# Patient Record
Sex: Male | Born: 1945 | Race: White | Hispanic: No | Marital: Married | Smoking: Never smoker
Health system: Southern US, Community
[De-identification: ages and names within clinical notes are randomized; demographics above are authoritative.]

## PROBLEM LIST (undated history)

## (undated) DIAGNOSIS — T7840XA Allergy, unspecified, initial encounter: Secondary | ICD-10-CM

## (undated) DIAGNOSIS — F458 Other somatoform disorders: Secondary | ICD-10-CM

## (undated) DIAGNOSIS — M75101 Unspecified rotator cuff tear or rupture of right shoulder, not specified as traumatic: Secondary | ICD-10-CM

## (undated) DIAGNOSIS — K529 Noninfective gastroenteritis and colitis, unspecified: Secondary | ICD-10-CM

## (undated) DIAGNOSIS — K8020 Calculus of gallbladder without cholecystitis without obstruction: Secondary | ICD-10-CM

## (undated) DIAGNOSIS — I739 Peripheral vascular disease, unspecified: Secondary | ICD-10-CM

## (undated) DIAGNOSIS — H269 Unspecified cataract: Secondary | ICD-10-CM

## (undated) DIAGNOSIS — Z973 Presence of spectacles and contact lenses: Secondary | ICD-10-CM

## (undated) DIAGNOSIS — H903 Sensorineural hearing loss, bilateral: Secondary | ICD-10-CM

## (undated) DIAGNOSIS — H6990 Unspecified Eustachian tube disorder, unspecified ear: Secondary | ICD-10-CM

## (undated) DIAGNOSIS — N400 Enlarged prostate without lower urinary tract symptoms: Secondary | ICD-10-CM

## (undated) DIAGNOSIS — M1990 Unspecified osteoarthritis, unspecified site: Secondary | ICD-10-CM

## (undated) DIAGNOSIS — I70219 Atherosclerosis of native arteries of extremities with intermittent claudication, unspecified extremity: Secondary | ICD-10-CM

## (undated) DIAGNOSIS — R339 Retention of urine, unspecified: Secondary | ICD-10-CM

## (undated) DIAGNOSIS — J300 Vasomotor rhinitis: Secondary | ICD-10-CM

## (undated) DIAGNOSIS — E8881 Metabolic syndrome: Secondary | ICD-10-CM

## (undated) DIAGNOSIS — R0989 Other specified symptoms and signs involving the circulatory and respiratory systems: Secondary | ICD-10-CM

## (undated) DIAGNOSIS — E782 Mixed hyperlipidemia: Secondary | ICD-10-CM

## (undated) DIAGNOSIS — I2510 Atherosclerotic heart disease of native coronary artery without angina pectoris: Secondary | ICD-10-CM

## (undated) DIAGNOSIS — N411 Chronic prostatitis: Secondary | ICD-10-CM

## (undated) DIAGNOSIS — I10 Essential (primary) hypertension: Secondary | ICD-10-CM

## (undated) DIAGNOSIS — M75102 Unspecified rotator cuff tear or rupture of left shoulder, not specified as traumatic: Secondary | ICD-10-CM

## (undated) DIAGNOSIS — G709 Myoneural disorder, unspecified: Secondary | ICD-10-CM

## (undated) DIAGNOSIS — K219 Gastro-esophageal reflux disease without esophagitis: Secondary | ICD-10-CM

## (undated) DIAGNOSIS — I200 Unstable angina: Secondary | ICD-10-CM

## (undated) DIAGNOSIS — I209 Angina pectoris, unspecified: Secondary | ICD-10-CM

## (undated) DIAGNOSIS — E785 Hyperlipidemia, unspecified: Secondary | ICD-10-CM

## (undated) HISTORY — DX: Benign prostatic hyperplasia without lower urinary tract symptoms: N40.0

## (undated) HISTORY — DX: Atherosclerotic heart disease of native coronary artery without angina pectoris: I25.10

## (undated) HISTORY — DX: Gastro-esophageal reflux disease without esophagitis: K21.9

## (undated) HISTORY — DX: Unspecified cataract: H26.9

## (undated) HISTORY — DX: Allergy, unspecified, initial encounter: T78.40XA

## (undated) HISTORY — DX: Myoneural disorder, unspecified: G70.9

## (undated) HISTORY — PX: POLYPECTOMY: SHX149

## (undated) HISTORY — DX: Hyperlipidemia, unspecified: E78.5

## (undated) HISTORY — PX: OTHER SURGICAL HISTORY: SHX169

## (undated) HISTORY — PX: WISDOM TOOTH EXTRACTION: SHX21

## (undated) HISTORY — DX: Essential (primary) hypertension: I10

## (undated) HISTORY — PX: UPPER GASTROINTESTINAL ENDOSCOPY: SHX188

## (undated) NOTE — *Deleted (*Deleted)
[address]       [place] [address]             [phone]        SULAYMAN MANNING [facility] Medical Record #[id number] Date of Birth: 07/02/1946  Referring: No ref. provider found Primary Care: Joaquim Nam, MD Primary Cardiologist:Bruce Mardene Sayer, MD  Chief Complaint: Chest pain  History of Present Illness:    We are asked to see this 22 year old male in cardiothoracic surgical consultation for consideration of CABG.  He has a past  medical history inclusive of coronary disease based on coronary calcium score as well as dyslipidemia, hypertension, and gastroesophageal reflux.  He did have a normal stress test in 2010.  He presented to the ER with substernal chest pain that was noted to be worse with exertion.  He was admitted with slightly elevated troponin levels for further evaluation and treatment.  An echocardiogram showed normal LV ejection fraction of 60% with no evidence of significant valvular heart disease.  He underwent cardiac catheterization which showed severe left main, ostial LAD, circumflex and ostial right coronary artery disease and he was transferred to [facility] for surgical evaluation.    Current Activity/ Functional Status: {functional status:19517}   Zubrod Score: At the time of surgery this patient's most appropriate activity status/level should be described as: []     0    Normal activity, no symptoms []     1    Restricted in physical strenuous activity but ambulatory, able to do out light work []     2    Ambulatory and capable of self care, unable to do work activities, up and about                 more than 50%  Of the time                            []     3    Only limited self care, in bed greater than 50% of waking hours []     4    Completely disabled, no self care, confined to bed or chair []     5    Moribund  Past Medical History:  Diagnosis Date  . Allergy   . Anginal pain (HCC)   . BPH (benign prostatic  hypertrophy) 6/07  . CAD (coronary artery disease) 2010   dx made by coronary calcium scoring, Normal stress test in 2010.   Marland Kitchen GERD (gastroesophageal reflux disease) 12/05  . HLD (hyperlipidemia) 3/99  . HTN (hypertension) 2002   no meds  . Left rotator cuff tear 08/12/2011   Per Dr. Dion Saucier 2013 L rotator cuff tear on MRI 2013   . Right rotator cuff tear 05/24/2013  . Wears glasses     Past Surgical History:  Procedure Laterality Date  . admitted to [facility] chest pain  12/15/05   Dr. Juliann Pares  . BLEPHAROPLASTY  10/11   Dr. Kemper Durie   . bone spur removed Right   . COLONOSCOPY  09/24/04   nml; Dr. Sharen Hint   . EGD esophagitis by bx  01/26/06   gastritis bx neg h-pylori  . POLYPECTOMY    . right and left prostate needle bx  05/02/03   acute inflammation; Dr. Patsi Sears   . SHOULDER ARTHROSCOPY W/ ROTATOR CUFF REPAIR  12/2011   left  . SHOULDER ARTHROSCOPY WITH ROTATOR CUFF REPAIR AND SUBACROMIAL DECOMPRESSION  Right 05/24/2013   Procedure: RIGHT SHOULDER ARTHROSCOPY WITH ROTATOR CUFF REPAIR AND SUBACROMIAL DECOMPRESSION AND PARTIAL ACROMIOPLSTY WITH CORACOACROMIAL RELEASE, DISTAL CLAVICULECTOMY, LABRIAL DEBRIDEMENT;  Surgeon: Eulas Post, MD;  Location: [facility];  Service: Orthopedics;  Laterality: Right;  . spect ETT nml  12/16/05  . UPPER GASTROINTESTINAL ENDOSCOPY    . WISDOM TOOTH EXTRACTION      Social History   Tobacco Use  Smoking Status Never Smoker  Smokeless Tobacco Never Used    Social History   Substance and Sexual Activity  Alcohol Use Yes  . Alcohol/week: 0.0 standard drinks   Comment: beer, occ     Allergies  Allergen Reactions  . Ciprofloxacin Hcl Rash  . Quinolones Rash    No current facility-administered medications for this encounter.    Facility-Administered Medications Prior to Admission  Medication Dose Route Frequency Provider Last Rate Last Admin  . betamethasone acetate-betamethasone sodium phosphate (CELESTONE) injection 3 mg  3  mg Intramuscular Once Felecia Shelling, DPM       Medications Prior to Admission  Medication Sig Dispense Refill Last Dose  . alfuzosin (UROXATRAL) 10 MG 24 hr tablet Take 1 tablet (10 mg total) by mouth daily with breakfast. 90 tablet 3   . aspirin 81 MG EC tablet Take 81 mg by mouth daily.       . Azelastine-Fluticasone 137-50 MCG/ACT SUSP Place 1 spray into both nostrils 2 (two) times daily.     . calcium citrate-vitamin D (CITRACAL+D) 315-200 MG-UNIT tablet Take 1-2 tablets by mouth 2 (two) times daily.     . Cholecalciferol (VITAMIN D) 2000 UNITS CAPS Take 1 capsule by mouth daily.       . clindamycin (CLEOCIN T) 1 % external solution Apply topically 2 (two) times daily as needed.     . Cyanocobalamin (VITAMIN B-12) 5000 MCG SUBL Place 1 tablet under the tongue daily.     . finasteride (PROSCAR) 5 MG tablet Take 1 tablet (5 mg total) by mouth daily. 90 tablet 3   . Icosapent Ethyl (VASCEPA) 1 G CAPS Take 1 tablet by mouth 4 (four) times daily.     Melene Muller ON 05/28/2020] metoprolol succinate (TOPROL-XL) 25 MG 24 hr tablet Take 1 tablet (25 mg total) by mouth daily. 30 tablet 0   . Multiple Vitamin (MULTIVITAMIN) tablet Take 1 tablet by mouth daily.       . rosuvastatin (CRESTOR) 5 MG tablet Take 5 mg by mouth daily.       Family History  Problem Relation Age of Onset  . Stroke Mother   . Glaucoma Other        grandmother at 89  . Barrett's esophagus Maternal Uncle   . Prostate cancer Neg Hx   . Colon cancer Neg Hx   . Colon polyps Neg Hx   . Esophageal cancer Neg Hx   . Rectal cancer Neg Hx   . Stomach cancer Neg Hx      Review of Systems:   Review of Systems  Constitutional: Negative for chills, diaphoresis, fever, malaise/fatigue and weight loss.  HENT: Negative for congestion, ear discharge, ear pain, hearing loss, nosebleeds, sinus pain and tinnitus.   Respiratory: Positive for shortness of breath.   Cardiovascular: Positive for chest pain.  Skin: Negative for itching  and rash.   {Ros - complete:30496}     Cardiac Review of Systems: Y or  [    ]= no  Chest Pain [    ]  Resting SOB [   ] Exertional SOB  [  ]  Orthopnea [  ]   Pedal Edema [   ]    Palpitations [  ] Syncope  [  ]   Presyncope [   ]  General Review of Systems: [Y] = yes [  ]=no Constitional: recent weight change [  ]; anorexia [  ]; fatigue [  ]; nausea [  ]; night sweats [  ]; fever [  ]; or chills [  ]                                                               Dental: Last Dentist visit:   Eye : blurred vision [  ]; diplopia [   ]; vision changes [  ];  Amaurosis fugax[  ]; Resp: cough [  ];  wheezing[  ];  hemoptysis[  ]; shortness of breath[  ]; paroxysmal nocturnal dyspnea[  ]; dyspnea on exertion[  ]; or orthopnea[  ];  GI:  gallstones[  ], vomiting[  ];  dysphagia[  ]; melena[  ];  hematochezia [  ]; heartburn[  ];   Hx of  Colonoscopy[  ]; GU: kidney stones [  ]; hematuria[  ];   dysuria [  ];  nocturia[  ];  history of     obstruction [  ]; urinary frequency [  ]             Skin: rash, swelling[  ];, hair loss[  ];  peripheral edema[  ];  or itching[  ]; Musculosketetal: myalgias[  ];  joint swelling[  ];  joint erythema[  ];  joint pain[  ];  back pain[  ];  Heme/Lymph: bruising[  ];  bleeding[  ];  anemia[  ];  Neuro: TIA[  ];  headaches[  ];  stroke[  ];  vertigo[  ];  seizures[  ];   paresthesias[  ];  difficulty walking[  ];  Psych:depression[  ]; anxiety[  ];  Endocrine: diabetes[  ];  thyroid dysfunction[  ];            {Dental Care:21289::"Single injection performed as below:"}            {Flue/Pneumonia Vaccination Status:21291}      Physical Exam: There were no vitals taken for this visit.   {physical exam:21449}  Diagnostic Studies & Laboratory data:     Recent Radiology Findings:   DG Chest 2 View  Result Date: 05/25/2020 CLINICAL DATA:  Chest pain EXAM: CHEST - 2 VIEW COMPARISON:  Chest x-ray 12/15/2005 FINDINGS: The heart size and mediastinal contours  are within normal limits. Left base compressive atelectasis. No focal consolidation. No pulmonary edema. Blunting of bilateral costophrenic angles with possible trace pleural effusions bilaterally. No pneumothorax. No acute osseous abnormality. Multilevel degenerative changes of the spine. IMPRESSION: 1. Blunting of bilateral costophrenic angles with possible trace pleural effusions bilaterally. 2. Otherwise no acute cardiopulmonary abnormality. Electronically Signed   By: Tish Frederickson M.D.   On: 05/25/2020 19:52   CARDIAC CATHETERIZATION  Result Date: 05/26/2020  Ost LAD to Prox LAD lesion is 95% stenosed.  Prox LAD to Mid LAD lesion is 75% stenosed with 75% stenosed side branch in 1st Sept.  Ramus lesion is 90%  stenosed.  Ost Cx to Prox Cx lesion is 99% stenosed.  Mid Cx lesion is 100% stenosed.  Prox RCA lesion is 100% stenosed.  Mid RCA lesion is 100% stenosed.  84 year old male with severe hyperlipidemia hypertension with progressive symptoms of shortness of breath anginal symptoms over the last several weeks with some chest pain at rest and a slight elevation of troponin Cardiogram showing normal LV systolic function with ejection fraction to 60% and no evidence of significant valvular heart disease Left ventricle angiogram showing normal LV systolic function Patient has critical distal left main ostial LAD ostial ramus ostial right coronary artery and ostial circumflex artery stenosis Plan Continue nitrates for chest discomfort Aspirin beta-blocker for hypertension control and cardiovascular disease Discussion for transfer for coronary artery bypass graft Cardiac rehabilitation   ECHOCARDIOGRAM COMPLETE  Result Date: 05/26/2020    ECHOCARDIOGRAM REPORT   Patient Name:   ZAIRE LEVESQUE Villaflor Date of Exam: 05/26/2020 Medical Rec #:  [id number]        Height:       66.0 in Accession #:    [id number]       Weight:       200.0 lb Date of Birth:  15-Jan-1946       BSA:          2.000 m Patient Age:     73 years         BP:           178/85 mmHg Patient Gender: M                HR:           49 bpm. Exam Location:  [facility] Procedure: 2D Echo, Cardiac Doppler and Color Doppler Indications:     Chest pain 786.50  History:         Patient has no prior history of Echocardiogram examinations.                  CAD; Risk Factors:Hypertension and Dyslipidemia.  Sonographer:     Cristela Blue RDCS (AE) Referring Phys:  [id number] Lucile Shutters Diagnosing Phys: Arnoldo Hooker MD IMPRESSIONS  1. Left ventricular ejection fraction, by estimation, is 60 to 65%. The left ventricle has normal function. The left ventricle has no regional wall motion abnormalities. Left ventricular diastolic parameters were normal.  2. Right ventricular systolic function is normal. The right ventricular size is normal.  3. The mitral valve is normal in structure. Trivial mitral valve regurgitation.  4. The aortic valve is normal in structure. Aortic valve regurgitation is not visualized. FINDINGS  Left Ventricle: Left ventricular ejection fraction, by estimation, is 60 to 65%. The left ventricle has normal function. The left ventricle has no regional wall motion abnormalities. The left ventricular internal cavity size was normal in size. There is  no left ventricular hypertrophy. Left ventricular diastolic parameters were normal. Right Ventricle: The right ventricular size is normal. No increase in right ventricular wall thickness. Right ventricular systolic function is normal. Left Atrium: Left atrial size was normal in size. Right Atrium: Right atrial size was normal in size. Pericardium: There is no evidence of pericardial effusion. Mitral Valve: The mitral valve is normal in structure. Trivial mitral valve regurgitation. Tricuspid Valve: The tricuspid valve is normal in structure. Tricuspid valve regurgitation is trivial. Aortic Valve: The aortic valve is normal in structure. Aortic valve regurgitation is not visualized. Aortic valve mean gradient  measures 2.0 mmHg. Aortic valve peak gradient measures 3.9 mmHg. Aortic  valve area, by VTI measures 4.73 cm. Pulmonic Valve: The pulmonic valve was normal in structure. Pulmonic valve regurgitation is not visualized. Aorta: The aortic root and ascending aorta are structurally normal, with no evidence of dilitation. IAS/Shunts: No atrial level shunt detected by color flow Doppler.  LEFT VENTRICLE PLAX 2D LVIDd:         4.36 cm LVIDs:         2.80 cm LV PW:         1.32 cm LV IVS:        1.21 cm LVOT diam:     2.30 cm LV SV:         95 LV SV Index:   48 LVOT Area:     4.15 cm  RIGHT VENTRICLE RV Basal diam:  3.72 cm RV S prime:     12.20 cm/s TAPSE (M-mode): 3.1 cm LEFT ATRIUM             Index       RIGHT ATRIUM           Index LA diam:        3.80 cm 1.90 cm/m  RA Area:     17.70 cm LA Vol (A2C):   64.8 ml 32.40 ml/m RA Volume:   48.70 ml  24.35 ml/m LA Vol (A4C):   58.4 ml 29.20 ml/m LA Biplane Vol: 61.7 ml 30.85 ml/m  AORTIC VALVE                   PULMONIC VALVE AV Area (Vmax):    3.60 cm    PV Vmax:        0.57 m/s AV Area (Vmean):   4.05 cm    PV Peak grad:   1.3 mmHg AV Area (VTI):     4.73 cm    RVOT Peak grad: 2 mmHg AV Vmax:           98.80 cm/s AV Vmean:          64.750 cm/s AV VTI:            0.201 m AV Peak Grad:      3.9 mmHg AV Mean Grad:      2.0 mmHg LVOT Vmax:         85.50 cm/s LVOT Vmean:        63.100 cm/s LVOT VTI:          0.229 m LVOT/AV VTI ratio: 1.14  AORTA Ao Root diam: 3.10 cm MITRAL VALVE                TRICUSPID VALVE MV Area (PHT): 2.39 cm     TR Peak grad:   13.1 mmHg MV Decel Time: 318 msec     TR Vmax:        181.00 cm/s MV E velocity: 84.00 cm/s MV A velocity: 116.00 cm/s  SHUNTS MV E/A ratio:  0.72         Systemic VTI:  0.23 m                             Systemic Diam: 2.30 cm Arnoldo Hooker MD Electronically signed by Arnoldo Hooker MD Signature Date/Time: 05/26/2020/1:42:02 PM    Final      I have independently reviewed the above radiologic studies and discussed  with the patient   Recent Lab Findings: Lab Results  Component Value Date   WBC 5.6 05/26/2020  HGB 13.8 05/26/2020   HCT 40.7 05/26/2020   PLT 211 05/26/2020   GLUCOSE 90 05/25/2020   CHOL 143 05/26/2020   TRIG 141 05/26/2020   HDL 52 05/26/2020   LDLCALC 63 05/26/2020   ALT 29 05/25/2020   AST 34 05/25/2020   NA 136 05/25/2020   K 4.1 05/25/2020   CL 100 05/25/2020   CREATININE 0.87 05/26/2020   BUN 21 05/25/2020   CO2 27 05/25/2020   TSH 2.478 02/05/2003      Assessment / Plan:          I  spent {CHL ONC TIME VISIT - NGEXB:[id number]} counseling the patient face to face.   Rowe Clack, PA-C 05/27/2020 4:00 PM

---

## 1997-09-22 DIAGNOSIS — E785 Hyperlipidemia, unspecified: Secondary | ICD-10-CM

## 1997-09-22 HISTORY — DX: Hyperlipidemia, unspecified: E78.5

## 1998-12-24 ENCOUNTER — Encounter: Payer: Self-pay | Admitting: Family Medicine

## 1998-12-24 LAB — CONVERTED CEMR LAB: PSA: 3.8 ng/mL

## 1999-01-18 ENCOUNTER — Encounter: Payer: Self-pay | Admitting: Family Medicine

## 1999-01-18 LAB — CONVERTED CEMR LAB: PSA: 3.8 ng/mL

## 1999-12-24 ENCOUNTER — Encounter: Payer: Self-pay | Admitting: Family Medicine

## 1999-12-24 LAB — CONVERTED CEMR LAB: PSA: 4 ng/mL

## 2000-01-21 ENCOUNTER — Encounter: Payer: Self-pay | Admitting: Family Medicine

## 2000-01-21 LAB — CONVERTED CEMR LAB
Blood Glucose, Fasting: 90 mg/dL
PSA: 4 ng/mL

## 2000-07-25 DIAGNOSIS — I10 Essential (primary) hypertension: Secondary | ICD-10-CM | POA: Diagnosis present

## 2000-07-25 DIAGNOSIS — N400 Enlarged prostate without lower urinary tract symptoms: Secondary | ICD-10-CM | POA: Diagnosis present

## 2000-07-25 HISTORY — DX: Essential (primary) hypertension: I10

## 2003-01-23 ENCOUNTER — Encounter: Payer: Self-pay | Admitting: Family Medicine

## 2003-01-23 LAB — CONVERTED CEMR LAB: PSA: 4.2 ng/mL

## 2003-02-05 ENCOUNTER — Encounter: Payer: Self-pay | Admitting: Family Medicine

## 2003-02-05 LAB — CONVERTED CEMR LAB
Blood Glucose, Fasting: 98 mg/dL
TSH: 2.478 microintl units/mL

## 2003-02-06 ENCOUNTER — Encounter: Payer: Self-pay | Admitting: Family Medicine

## 2003-02-06 LAB — CONVERTED CEMR LAB: PSA: 4.2 ng/mL

## 2003-05-02 HISTORY — PX: OTHER SURGICAL HISTORY: SHX169

## 2004-06-08 ENCOUNTER — Ambulatory Visit: Payer: Self-pay | Admitting: Family Medicine

## 2004-06-24 DIAGNOSIS — K219 Gastro-esophageal reflux disease without esophagitis: Secondary | ICD-10-CM

## 2004-06-24 HISTORY — DX: Gastro-esophageal reflux disease without esophagitis: K21.9

## 2004-08-09 ENCOUNTER — Encounter: Payer: Self-pay | Admitting: Family Medicine

## 2004-08-09 LAB — CONVERTED CEMR LAB: PSA: 2.8 ng/mL

## 2004-09-24 ENCOUNTER — Ambulatory Visit: Payer: Self-pay | Admitting: Gastroenterology

## 2004-09-24 HISTORY — PX: COLONOSCOPY: SHX174

## 2004-10-04 ENCOUNTER — Encounter: Admission: RE | Admit: 2004-10-04 | Discharge: 2004-10-04 | Payer: Self-pay | Admitting: Allergy and Immunology

## 2005-04-06 ENCOUNTER — Encounter: Payer: Self-pay | Admitting: Family Medicine

## 2005-04-06 LAB — CONVERTED CEMR LAB: PSA: 4.4 ng/mL

## 2005-06-09 ENCOUNTER — Ambulatory Visit: Payer: Self-pay | Admitting: Family Medicine

## 2005-08-08 ENCOUNTER — Encounter: Payer: Self-pay | Admitting: Family Medicine

## 2005-08-08 LAB — CONVERTED CEMR LAB: PSA: 4.39 ng/mL

## 2005-12-15 ENCOUNTER — Observation Stay: Payer: Self-pay | Admitting: Internal Medicine

## 2005-12-15 ENCOUNTER — Other Ambulatory Visit: Payer: Self-pay

## 2005-12-15 ENCOUNTER — Encounter: Payer: Self-pay | Admitting: Family Medicine

## 2005-12-15 HISTORY — PX: OTHER SURGICAL HISTORY: SHX169

## 2005-12-15 LAB — CONVERTED CEMR LAB: Blood Glucose, Fasting: 115 mg/dL

## 2005-12-16 HISTORY — PX: OTHER SURGICAL HISTORY: SHX169

## 2005-12-23 DIAGNOSIS — N400 Enlarged prostate without lower urinary tract symptoms: Secondary | ICD-10-CM

## 2005-12-23 HISTORY — DX: Benign prostatic hyperplasia without lower urinary tract symptoms: N40.0

## 2005-12-26 ENCOUNTER — Ambulatory Visit: Payer: Self-pay | Admitting: Family Medicine

## 2005-12-26 LAB — CONVERTED CEMR LAB: PSA: 4 ng/mL

## 2006-01-12 ENCOUNTER — Ambulatory Visit: Payer: Self-pay | Admitting: Gastroenterology

## 2006-01-19 ENCOUNTER — Ambulatory Visit: Payer: Self-pay | Admitting: Gastroenterology

## 2006-01-26 ENCOUNTER — Ambulatory Visit: Payer: Self-pay | Admitting: Gastroenterology

## 2006-01-26 ENCOUNTER — Encounter: Payer: Self-pay | Admitting: Gastroenterology

## 2006-01-26 HISTORY — PX: OTHER SURGICAL HISTORY: SHX169

## 2006-05-11 ENCOUNTER — Ambulatory Visit: Payer: Self-pay | Admitting: Gastroenterology

## 2006-11-27 ENCOUNTER — Telehealth: Payer: Self-pay | Admitting: Family Medicine

## 2007-02-12 ENCOUNTER — Encounter: Payer: Self-pay | Admitting: Family Medicine

## 2007-02-16 ENCOUNTER — Encounter: Payer: Self-pay | Admitting: Family Medicine

## 2007-02-16 DIAGNOSIS — N411 Chronic prostatitis: Secondary | ICD-10-CM | POA: Insufficient documentation

## 2007-02-16 DIAGNOSIS — J309 Allergic rhinitis, unspecified: Secondary | ICD-10-CM | POA: Insufficient documentation

## 2007-02-16 DIAGNOSIS — N3010 Interstitial cystitis (chronic) without hematuria: Secondary | ICD-10-CM | POA: Insufficient documentation

## 2007-02-18 DIAGNOSIS — I10 Essential (primary) hypertension: Secondary | ICD-10-CM | POA: Insufficient documentation

## 2007-02-18 DIAGNOSIS — K219 Gastro-esophageal reflux disease without esophagitis: Secondary | ICD-10-CM | POA: Insufficient documentation

## 2007-02-23 ENCOUNTER — Ambulatory Visit: Payer: Self-pay | Admitting: Family Medicine

## 2007-02-23 DIAGNOSIS — E8881 Metabolic syndrome: Secondary | ICD-10-CM | POA: Insufficient documentation

## 2007-03-19 ENCOUNTER — Encounter: Payer: Self-pay | Admitting: Family Medicine

## 2007-05-14 ENCOUNTER — Encounter: Payer: Self-pay | Admitting: Family Medicine

## 2007-09-03 ENCOUNTER — Encounter: Payer: Self-pay | Admitting: Family Medicine

## 2007-10-08 ENCOUNTER — Ambulatory Visit: Payer: Self-pay | Admitting: Family Medicine

## 2007-10-08 DIAGNOSIS — H6120 Impacted cerumen, unspecified ear: Secondary | ICD-10-CM | POA: Insufficient documentation

## 2007-11-12 ENCOUNTER — Encounter: Payer: Self-pay | Admitting: Family Medicine

## 2008-05-07 ENCOUNTER — Encounter: Payer: Self-pay | Admitting: Family Medicine

## 2008-07-25 DIAGNOSIS — I2510 Atherosclerotic heart disease of native coronary artery without angina pectoris: Secondary | ICD-10-CM

## 2008-07-25 HISTORY — DX: Atherosclerotic heart disease of native coronary artery without angina pectoris: I25.10

## 2008-08-04 ENCOUNTER — Encounter: Payer: Self-pay | Admitting: Family Medicine

## 2008-08-07 ENCOUNTER — Ambulatory Visit: Payer: Self-pay | Admitting: Family Medicine

## 2009-02-09 ENCOUNTER — Ambulatory Visit: Payer: Self-pay | Admitting: Family Medicine

## 2009-03-12 ENCOUNTER — Encounter: Payer: Self-pay | Admitting: Family Medicine

## 2009-03-12 ENCOUNTER — Telehealth: Payer: Self-pay | Admitting: Family Medicine

## 2009-03-12 DIAGNOSIS — I519 Heart disease, unspecified: Secondary | ICD-10-CM | POA: Insufficient documentation

## 2009-03-13 ENCOUNTER — Encounter: Payer: Self-pay | Admitting: Family Medicine

## 2009-03-16 ENCOUNTER — Ambulatory Visit: Payer: Self-pay | Admitting: Cardiovascular Disease

## 2009-03-16 DIAGNOSIS — I2510 Atherosclerotic heart disease of native coronary artery without angina pectoris: Secondary | ICD-10-CM | POA: Insufficient documentation

## 2009-03-18 ENCOUNTER — Telehealth (INDEPENDENT_AMBULATORY_CARE_PROVIDER_SITE_OTHER): Payer: Self-pay

## 2009-03-19 ENCOUNTER — Encounter: Payer: Self-pay | Admitting: Internal Medicine

## 2009-03-19 ENCOUNTER — Ambulatory Visit: Payer: Self-pay

## 2009-04-16 ENCOUNTER — Encounter: Payer: Self-pay | Admitting: Family Medicine

## 2009-05-11 ENCOUNTER — Encounter: Payer: Self-pay | Admitting: Family Medicine

## 2009-06-03 ENCOUNTER — Ambulatory Visit: Payer: Self-pay | Admitting: Family Medicine

## 2009-07-06 ENCOUNTER — Telehealth: Payer: Self-pay | Admitting: Family Medicine

## 2009-07-07 ENCOUNTER — Telehealth: Payer: Self-pay | Admitting: Family Medicine

## 2009-07-14 ENCOUNTER — Encounter: Payer: Self-pay | Admitting: Family Medicine

## 2009-07-20 ENCOUNTER — Telehealth: Payer: Self-pay | Admitting: Family Medicine

## 2009-07-20 ENCOUNTER — Encounter: Payer: Self-pay | Admitting: Family Medicine

## 2009-07-23 ENCOUNTER — Telehealth: Payer: Self-pay | Admitting: Family Medicine

## 2009-07-27 ENCOUNTER — Encounter: Payer: Self-pay | Admitting: Family Medicine

## 2009-07-28 ENCOUNTER — Encounter: Payer: Self-pay | Admitting: Family Medicine

## 2009-07-28 ENCOUNTER — Telehealth: Payer: Self-pay | Admitting: Family Medicine

## 2009-08-13 ENCOUNTER — Ambulatory Visit: Payer: Self-pay | Admitting: Family Medicine

## 2009-09-04 ENCOUNTER — Telehealth: Payer: Self-pay | Admitting: Family Medicine

## 2009-10-06 ENCOUNTER — Telehealth: Payer: Self-pay | Admitting: Family Medicine

## 2009-10-06 ENCOUNTER — Encounter: Payer: Self-pay | Admitting: Family Medicine

## 2009-11-27 ENCOUNTER — Encounter: Payer: Self-pay | Admitting: Family Medicine

## 2010-03-02 ENCOUNTER — Encounter (INDEPENDENT_AMBULATORY_CARE_PROVIDER_SITE_OTHER): Payer: Self-pay | Admitting: *Deleted

## 2010-04-24 HISTORY — PX: BLEPHAROPLASTY: SUR158

## 2010-05-12 ENCOUNTER — Encounter: Payer: Self-pay | Admitting: Family Medicine

## 2010-08-04 ENCOUNTER — Telehealth: Payer: Self-pay | Admitting: Family Medicine

## 2010-08-06 ENCOUNTER — Encounter: Payer: Self-pay | Admitting: Family Medicine

## 2010-08-10 ENCOUNTER — Ambulatory Visit
Admission: RE | Admit: 2010-08-10 | Discharge: 2010-08-10 | Payer: Self-pay | Source: Home / Self Care | Attending: Family Medicine | Admitting: Family Medicine

## 2010-08-10 ENCOUNTER — Encounter: Payer: Self-pay | Admitting: Family Medicine

## 2010-08-10 DIAGNOSIS — I7300 Raynaud's syndrome without gangrene: Secondary | ICD-10-CM | POA: Insufficient documentation

## 2010-08-24 NOTE — Letter (Signed)
Summary: [employer]Note  [employer]Note   Imported By: Beau Fanny 04/30/2009 13:45:54  _____________________________________________________________________  External Attachment:    Type:   Image     Comment:   External Document

## 2010-08-24 NOTE — Medication Information (Signed)
Summary: Prior Authorization Request for Lipitor/[facility]  Prior Authorization Request for Lipitor/[facility]   Imported By: Maryln Gottron 07/22/2009 14:35:02  _____________________________________________________________________  External Attachment:    Type:   Image     Comment:   External Document

## 2010-08-24 NOTE — Consult Note (Signed)
Summary: [facility]/Consultation Report/Dr. Achilles Dunk  [facility]/Consultation Report/Dr. Achilles Dunk   Imported By: Mickle Asper 05/08/2008 12:48:47  _____________________________________________________________________  External Attachment:    Type:   Image     Comment:   External Document

## 2010-08-24 NOTE — Letter (Signed)
Summary: Dr.Brian Cope,[facility],Note  Dr.Brian Cope,[facility],Note   Imported By: Beau Fanny 05/12/2009 13:43:40  _____________________________________________________________________  External Attachment:    Type:   Image     Comment:   External Document

## 2010-08-24 NOTE — Assessment & Plan Note (Signed)
Summary: NEW PT/NE  Medications Added AVODART 0.5 MG  CAPS (DUTASTERIDE) 1by mouth twice a week OMEPRAZOLE 20 MG  CPDR (OMEPRAZOLE) 1 CAPSULES DAILY FISH OIL CONCENTRATE 1000 MG  CAPS (OMEGA-3 FATTY ACIDS) Take 2 capsule by mouth two times a day        Visit Type:  New patient  Primary Provider:  Shaune Leeks MD  CC:  postive calcium score test.  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  The pt has no history of CAD or cardiac events prior to recently being diagnosed with high coronary calcification. Plays golf regularly and used to exercise regularly without symptoms. Denies any history of chest pain or pressure. Also denies dyspnea, orthopnea, PND, edema, or palpitations.  Prior to receiving his coronary calcium score, he was undergoing a trial of lifestyle modification for hyperlipidemia. He brings in labs from 03/09/09 showing chol 309, Trig 336, HDL 47, and LDL 195 (the NMR Lipoprofile LDL was 177).  Home BP's are within the normal range. The pt has a history of white coat HTN. He has no family history of premature CAD.  Preventive Screening-Counseling & Management  Alcohol-Tobacco     Smoking Status: quit  Current Medications (verified): 1)  Avodart 0.5 Mg  Caps (Dutasteride) .Marland Kitchen.. 1by Mouth Twice A Week 2)  Flonase 50 Mcg/act  Susp (Fluticasone Propionate) .... 2 Sprays Each Nostril Qd 3)  Alprazolam 0.5 Mg  Tb24 (Alprazolam) .Marland Kitchen.. 1 Twice A Day By Mouth 4)  Omeprazole 20 Mg  Cpdr (Omeprazole) .Marland Kitchen.. 1 Capsules Daily 5)  Vitamin D 1000 Unit  Tabs (Cholecalciferol) .... Take 1 Tablet By Mouth Two Times A Day 6)  Fish Oil Concentrate 1000 Mg  Caps (Omega-3 Fatty Acids) .... Take 2 Capsule By Mouth Two Times A Day 7)  Ipratropium Bromide 0.03 % Soln (Ipratropium Bromide) .Marland Kitchen.. 1 Spray Daily 8)  Multivitamins  Tabs (Multiple Vitamin) .Marland Kitchen.. 1 Daily By Mouth 9)  Cissus .... Takes Daily  O.t.c.  Allergies: 1)  ! Ciprofloxacin Hcl (Ciprofloxacin Hcl) 2)  ! Charolette Child  Past History:  Past medical, surgical, family and social histories (including risk factors) reviewed for relevance to current acute and chronic problems.  Past Medical History: GERD: 06/2004 Hyperlipidemia: 09/1997 Hypertension: 2002 Benign prostatic hypertrophy: 12/2005 CAD: Dx made by coronary calcification 2010  Past Surgical History: Reviewed history from 02/23/2007 and no changes required. COLONOSCOPY: NORMAL (DR. Sharen Hint) :(09/24/2004) SPECT. ETT NORMAL :(12/16/2005) EGD ESOPHAGITIS BY BX.--GASTRITIS BX. NEGATIVE H-PYLORI :(01/26/2006) ADMITTED TO [facility] CHEST PAIN ((12/15/2005) ( DR. Juliann Pares) RIGHT & LEFT PROSTATE NEEDLE BIOPSY/ ACUTE INFLAMMATION:(DR. TANNENBAUM) :(05/02/2003)  Family History: Reviewed history from 08/07/2008 and no changes required. Father: DECEASED 8 YOA CHF MI VALVE REPL DUE TO SCARLET & RHEMATIC FEVER MITAL VALVE SCARRED(threw clot) Mother: A  41  BREAST CANCER 2001 CREST Syndr MI BROTHER SISTER WITH NARROW EOSINOPHILLIC  ESOPHAGITIS CV: + FATHER MI HBP: + UNCLES, MGF DM: NEGATIVE GOUT/ARTHRITIS: PROSTATE CANCER: BREAST/OVARIAN/UTERINE CANCER: + MOTHER BREAST COLON CANCER: DEPRESSION: NEGATIVE ETOH/DRUG ABUSE: NEGATIVE OTHER: NEGATIVE STROKE GRANDMOTHER: + GLAUCOM AT 29 YOA  Social History: Reviewed history from 02/16/2007 and no changes required. Marital Status: Married  LIVES WITH WIFE Children: 1 AT [employer] [facility] [employer] 1 AT HOME 73 YOA Occupation: LAB DIRECTOR [employer] Smoking Status:  quit  Review of Systems       Heartburn, otherwise negative except as per HPI.  Vital Signs:  Patient profile:   65 year old male Weight:      202.04 pounds Pulse rate:   91 / minute Resp:     16 per minute BP sitting:   143 / 78  (right arm)  Physical Exam  General:  Pt is well-developed, alert and oriented, no  acute distress HEENT: normal Neck: no thyromegaly           JVP normal, carotid upstrokes normal without bruits Lungs: CTA Chest: equal expansion  CV: Apical impulse nondisplaced, RRR without murmur or gallop Abd: soft, NT, positive BS, no HSM, no bruit Back: no CVA tenderness Ext: no clubbing, cyanosis, or edema        femoral pulses 2+ without bruits        pedal pulses 2+ and equal Skin: warm, dry, no rash Neuro: CNII-XII intact,strength 5/5 = b/l    EKG  Procedure date:  03/16/2009  Findings:      NSR, within normal limits  Impression & Recommendations:  Problem # 1:  CAD, NATIVE VESSEL (ICD-414.01) The pt clearly has CAD, based on extensive coronary calcification. While he is asymptomatic at present, he is at high risk of a cardiac event. Recommend aggressive secondary risk reduction with treatment of hyperlipidemia with a statin. He would like to review this with Dr Karrie Meres, who is a lipid specialist, prior to beginning pharmacotherapy. Also recommend ASA 81 mg. Plan exercise myoview stress perfusion scan to evaluate for ischemia. If scan is normal, will continue with aggressive risk reduction. If abnormal, would have low threshold for cath. All of this was discussed with the patient in detail today.  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   Other Orders: Nuclear Stress Test (Nuc Stress Test)  Patient Instructions: 1)  Your physician has requested that you have an exercise stress myoview.  For further information please visit [email or web address]  Please follow instruction sheet, as given.

## 2010-08-24 NOTE — Progress Notes (Signed)
Summary: Pharmacy requesting to change Lipitor  Phone Note From Pharmacy Call back at [phone]   Caller: [facility] Call For: Dr. Hetty Ely  Request: Speak with Provider Summary of Call: Received a  Step Therapy Request Form from pharmacy requesting to change Lipitor to an alternative medication. Form is in your in box.  Initial call taken by: Sydell Axon LPN,  July 23, 2009 4:19 PM  Follow-up for Phone Call        Please call and ask pt which he prefers.  My opinion, Simva 40, Prava 80. Would not use Lovastatin. Follow-up by: Shaune Leeks MD,  July 23, 2009 4:44 PM  Additional Follow-up for Phone Call Additional follow up Details #1::        Patient notified as instructed by telephone. Patient stated that he would like to do some research and will call back to let Dr. Hetty Ely know which one he prefers. Sydell Axon LPN  July 23, 2009 4:55 PM Spoke to patient and was informed that he has consulted with his cardiologist/Dr. Karrie Meres and had lab work done this morning to see where he stands.He should get his results back in a few days and may not need the Lipitor because he has made several changes in his life style. Patient stated that we can go ahead and cancel the rx for Lipitor since the insurance company is not going to cover it and will decide if he needs medication after the lab work comes back and at that time decide what he should take.  Form is in your in box. Additional Follow-up by: Sydell Axon LPN,  July 27, 2009 4:11 PM    Additional Follow-up for Phone Call Additional follow up Details #2::    Noted. Follow-up by: Shaune Leeks MD,  July 27, 2009 5:15 PM

## 2010-08-24 NOTE — Consult Note (Signed)
Summary: [facility]/Dr. Achilles Dunk  [facility]/Dr. Achilles Dunk   Imported By: Eleonore Chiquito 11/13/2007 09:07:32  _____________________________________________________________________  External Attachment:    Type:   Image     Comment:   External Document

## 2010-08-24 NOTE — Progress Notes (Signed)
Summary: Denial for prior authorization for Lipitor  Phone Note From Pharmacy   [facility] Call For: Dr Hetty Ely  Summary of Call: [facility] sent denial for prior authorization for Lipitor. Form is on your desk. Initial call taken by: Lewanda Rife LPN,  July 20, 2009 5:22 PM  Follow-up for Phone Call        Noted. Follow-up by: Shaune Leeks MD,  July 21, 2009 7:39 AM

## 2010-08-24 NOTE — Progress Notes (Signed)
Summary: Pre-auth for Lipitor  Phone Note From Pharmacy   Caller: [facility] Call For: Dr. Hetty Ely  Summary of Call: another form for pre-auth for Lipitor is on your desk along with the previous one. Initial call taken by: Mervin Hack CMA Duncan Dull),  July 07, 2009 2:44 PM  Follow-up for Phone Call        Asked whynthere are so many forms for one transaction, sent to pharmacy. Follow-up by: Shaune Leeks MD,  July 07, 2009 3:19 PM  Additional Follow-up for Phone Call Additional follow up Details #1::        [facility]. DeShannon Smith CMA [facility] Dull)  July 07, 2009 3:39 PM

## 2010-08-24 NOTE — Progress Notes (Signed)
  Phone Note Call from Patient Call back at [facility] [phone]   Caller: Patient Call For: DR [facility] Details for Reason: [facility] HIS ANNUAL CPX Details of Complaint: NEEDS WRITTEN LAB ORDER  Summary of Call: PATIENT IS DR [facility] [place] WHO WORKS FOR [facility] HIS ANNUAL CPX IS SET FOR 02/23/07 WITH DR Hetty Ely. PLEASE WRITE ORDER FOR ALL LABS YOU WILL WANT TO ORDER HE GETS THEM DONE FOR FREE AT [facility]. I WILL MAIL THE ORDER TO THE PT WHEN I RECEIVE IT.  Initial call taken by: Carlton Adam,  Nov 27, 2006 3:23 PM  Follow-up for Phone Call        done.

## 2010-08-24 NOTE — Letter (Signed)
Summary: E-mail from Dr.William Comwell to patient  E-mail from Dr.William Comwell to patient   Imported By: Beau Fanny 10/06/2009 14:57:04  _____________________________________________________________________  External Attachment:    Type:   Image     Comment:   External Document

## 2010-08-24 NOTE — Letter (Signed)
Summary: Nadara Eaton letter  [place] at [facility]  [address] [place], [place] [address]   Phone: [phone]  Fax: [phone]       03/02/2010 MRN: [id number]  [facility] [address] [place], [place]  [address]  Dear Mr. Morson,  [facility] [place] announce the retirement of Arta Silence, M.D., from full-time practice at the [facility] office effective January 21, 2010 and his plans of returning part-time.  It is important to Dr. Hetty Ely and to our practice that you understand that [facility] has seven physicians in our office for your health care needs.  We will continue to offer the same exceptional care that you have today.    Dr. Hetty Ely has spoken to many of you about his plans for retirement and returning part-time in the fall.   We will continue to work with you through the transition to schedule appointments for you in the office and meet the high standards that [place] is committed to.   Again, it is with great pleasure that we share the news that Dr. Hetty Ely will return to [facility] in October of 2011 with a reduced schedule.    If you have any questions, or would like to request an appointment with one of our physicians, please call us at [phone] and press the option for Scheduling an appointment.  We take pleasure in providing you with excellent patient care and look forward to seeing you at your next office visit.  Our [facility] Physicians are:  Tillman Abide, M.D. Laurita Quint, M.D. Roxy Manns, M.D. Kerby Nora, M.D. Hannah Beat, M.D. Ruthe Mannan, M.D. We proudly welcomed Raechel Ache, M.D. and Eustaquio Boyden, M.D. to the practice in July/August 2011.  Sincerely,  [facility]

## 2010-08-24 NOTE — Letter (Signed)
Summary: Dr.Brian Cope,[facility],Note  Dr.Brian Cope,[facility],Note   Imported By: Beau Fanny 05/13/2010 10:36:45  _____________________________________________________________________  External Attachment:    Type:   Image     Comment:   External Document

## 2010-08-24 NOTE — Progress Notes (Signed)
Summary: [facility]/Brian S. Cope MD  [facility]/Brian S. Cope MD   Imported By: Eleonore Chiquito 05/15/2007 13:57:48  _____________________________________________________________________  External Attachment:    Type:   Image     Comment:   External Document

## 2010-08-24 NOTE — Progress Notes (Signed)
Summary: Nuc. Pre-Procedure   Phone Note Outgoing Call Call back at [facility] Phone [phone]   Call placed by: Irean Hong, RN,  March 18, 2009 2:14 PM Summary of Call: Reviewed information on [employer] Information Sheet (see scanned document for further details).  Spoke with patient.     Nuclear Med Background Indications for Stress Test: Evaluation for Ischemia  Indications Comments: Evaluation for CAD due to poss. extensive coronary calcification by recent cardiac CT on 03/12/09.  History: CT/MRI      Nuclear Pre-Procedure Cardiac Risk Factors: Family History - CAD, Hypertension, Lipids Height (in): 69.5

## 2010-08-24 NOTE — Assessment & Plan Note (Signed)
Summary: CPX/CLE   Vital Signs:  Patient Profile:   65 Years Old Male Height:     69.5 inches (176.53 cm) Weight:      213 pounds Temp:     98.1 degrees F oral Pulse rate:   88 / minute Pulse rhythm:   regular BP sitting:   150 / 88  (left arm) Cuff size:   large  Vitals Entered By: Providence Crosby (August 07, 2008 9:28 AM)                 Chief Complaint:  check up.  History of Present Illness: Pt here for Comp Exam.  His weight is  high at present.    Prior Medications Reviewed Using: Patient Recall  Current Allergies (reviewed today): ! CIPROFLOXACIN HCL (CIPROFLOXACIN HCL)   Family History:    Father: DECEASED 7 YOA CHF MI VALVE REPL DUE TO SCARLET & RHEMATIC FEVER    MITAL VALVE SCARRED(threw clot)    Mother: A  37  BREAST CANCER 2001 CREST Syndr MI    BROTHER    SISTER WITH NARROW EOSINOPHILLIC  ESOPHAGITIS    CV: + FATHER MI    HBP: + UNCLES, MGF    DM: NEGATIVE    GOUT/ARTHRITIS:    PROSTATE CANCER:    BREAST/OVARIAN/UTERINE CANCER: + MOTHER BREAST    COLON CANCER:    DEPRESSION: NEGATIVE    ETOH/DRUG ABUSE: NEGATIVE    OTHER: NEGATIVE STROKE    GRANDMOTHER: + GLAUCOM AT 31 YOA   Risk Factors:     Has patient --       Felt need to cut down:  no       Been annoyed by complaints:  no       Felt guilty about drinking:  no       Needed eye opener in the morning:  no    Counseled to quit/cut down alcohol use:  no   Review of Systems  General      Denies chills, fatigue, fever, loss of appetite, malaise, sleep disorder, sweats, weakness, and weight loss.  Eyes      See HPI      Denies blurring, discharge, double vision, eye irritation, eye pain, halos, itching, light sensitivity, red eye, vision loss-1 eye, and vision loss-both eyes.  ENT      Denies decreased hearing, difficulty swallowing, ear discharge, earache, hoarseness, nasal congestion, nosebleeds, postnasal drainage, ringing in ears, sinus pressure, and sore throat.  CV      Denies  bluish discoloration of lips or nails, chest pain or discomfort, difficulty breathing at night, difficulty breathing while lying down, fainting, fatigue, leg cramps with exertion, lightheadness, near fainting, palpitations, shortness of breath with exertion, swelling of feet, swelling of hands, and weight gain.  Resp      Denies chest discomfort, chest pain with inspiration, cough, coughing up blood, excessive snoring, hypersomnolence, morning headaches, pleuritic, shortness of breath, sputum productive, and wheezing.  GI      Complains of indigestion.      well controlled.  GU      Denies decreased libido, discharge, dysuria, erectile dysfunction, genital sores, hematuria, incontinence, nocturia, urinary frequency, and urinary hesitancy.  MS      occas hip pain   Derm      Denies changes in color of skin, changes in nail beds, dryness, excessive perspiration, flushing, hair loss, insect bite(s), itching, lesion(s), poor wound healing, and rash.      Sees Dr Jarold Motto regularly.  Neuro      Denies brief paralysis, difficulty with concentration, disturbances in coordination, falling down, headaches, inability to speak, memory loss, numbness, poor balance, seizures, sensation of room spinning, tingling, tremors, visual disturbances, and weakness.   Physical Exam  General:     Well-developed,well-nourished,in no acute distress; alert,appropriate and cooperative throughout examination Head:     Normocephalic and atraumatic without obvious abnormalities. No apparent alopecia or balding. Eyes:     Conjunctiva clear bilaterally.  Ears:     External ear exam shows no significant lesions or deformities.  Otoscopic examination reveals clear canals, tympanic membranes are intact bilaterally without bulging, retraction, inflammation or discharge. Hearing is grossly normal bilaterally. Nose:     External nasal examination shows no deformity or inflammation. Nasal mucosa are pink and moist  without lesions or exudates. Mouth:     Oral mucosa and oropharynx without lesions or exudates.  Teeth in good repair. Neck:     No deformities, masses, or tenderness noted. Chest Wall:     No deformities, masses, tenderness or gynecomastia noted. Breasts:     No masses or gynecomastia noted Lungs:     Normal respiratory effort, chest expands symmetrically. Lungs are clear to auscultation, no crackles or wheezes. Heart:     Normal rate and regular rhythm. S1 and S2 normal without gallop, murmur, click, rub or other extra sounds. Abdomen:     Bowel sounds positive,abdomen soft and non-tender without masses, organomegaly or hernias noted. Rectal:     not done...sees Dr Achilles Dunk. Genitalia:     Testes bilaterally descended without nodularity, tenderness or masses. No scrotal masses or lesions. No penis lesions or urethral discharge. Prostate:     not done. Msk:     No deformity or scoliosis noted of thoracic or lumbar spine.   Pulses:     R and L carotid,radial,femoral,dorsalis pedis and posterior tibial pulses are full and equal bilaterally Extremities:     No clubbing, cyanosis, edema, or deformity noted with normal full range of motion of all joints.   Neurologic:     No cranial nerve deficits noted. Station and gait are normal. Plantar reflexes are down-going bilaterally. DTRs are symmetrical throughout. Sensory, motor and coordinative functions appear intact. Skin:     Intact without suspicious lesions or rashes. No lesion seen on right posterior shoulder at site of occas 'feeling like a bee sting' Cervical Nodes:     No lymphadenopathy noted Inguinal Nodes:     No significant adenopathy Psych:     Cognition and judgment appear intact. Alert and cooperative with normal attention span and concentration. No apparent delusions, illusions, hallucinations    Impression & Recommendations:  Problem # 1:  HEALTH MAINTENANCE EXAM (ICD-V70.0) Assessment: Comment Only  Problem # 2:   CERUMEN IMPACTION, BILATERAL (ICD-380.4) Assessment: Improved But still a problem...discussed regular irrigation at home and technique discussed.  Problem # 3:  ALLERGIC RHINITIS, CHRONIC (ICD-477.9) Assessment: Unchanged Stable. His updated medication list for this problem includes:    Flonase 50 Mcg/act Susp (Fluticasone propionate) .Marland Kitchen... 2 sprays each nostril qd    Ipratropium Bromide 0.03 % Soln (Ipratropium bromide) .Marland Kitchen... 1 spray daily Discussed use of allergy medications and environmental measures.   Problem # 4:  BENIGN PROSTATIC HYPERTROPHY ( DR COPE) (ICD-600.00) Assessment: Improved Avodart and Xanax have imoproved sxs immmensely.  Problem # 5:  HYPERTENSION (ICD-401.9) Has ambulatory numbers chronically nml at 120-130/80s. Will follow. Is obviously extreme type A with white coat if there ever was  one. BP today: 150/88 Prior BP: 152/92 (10/08/2007)   Problem # 6:  HYPERLIPIDEMIA/TRIG (ICD-272.4) Assessment: Unchanged Trigs high. Long discussion of his complete understanding of the process and need for weight control and no Carbs.  His statement  "I need to treat myself as a diabetic."  Complete Medication List: 1)  Avodart 0.5 Mg Caps (Dutasteride) .Marland Kitchen.. 1 daily by mouth 2)  Flonase 50 Mcg/act Susp (Fluticasone propionate) .... 2 sprays each nostril qd 3)  Alprazolam 0.5 Mg Tb24 (Alprazolam) .Marland Kitchen.. 1 twice a day by mouth 4)  Omeprazole 20 Mg Cpdr (Omeprazole) .... 2 capsules daily 5)  Vitamin D 1000 Unit Tabs (Cholecalciferol) .... Take 1 tablet by mouth two times a day 6)  Fish Oil Concentrate 1000 Mg Caps (Omega-3 fatty acids) .... Take 1 capsule by mouth two times a day 7)  Ipratropium Bromide 0.03 % Soln (Ipratropium bromide) .Marland Kitchen.. 1 spray daily 8)  Multivitamins Tabs (Multiple vitamin) .Marland Kitchen.. 1 daily by mouth 9)  Cissus  .... Takes daily o.t.c.   Patient Instructions: 1)  RTC 6 mos. 2)  Lose a little weight but watch diet as he knows he should do and exercise  regularly.  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

## 2010-08-24 NOTE — Medication Information (Signed)
Summary: [facility] for Lipitor/[facility]  [facility] for Lipitor/[facility]   Imported By: Lanelle Bal 07/27/2009 13:21:31  _____________________________________________________________________  External Attachment:    Type:   Image     Comment:   External Document

## 2010-08-24 NOTE — Progress Notes (Signed)
Summary: lab results  Phone Note Call from Patient Call back at [phone]   Caller: Patient Call For: Shaune Leeks MD Summary of Call: Patient [facility]ed his [facility]. He says that he would like phone call to discuss the results. Results are on your shelf.  Initial call taken by: Melody Comas,  October 06, 2009 11:22 AM  Follow-up for Phone Call        Spoke with pt and agree with Dr Karrie Meres. Low dose Statin worthwhile eventually from  all the evidence I see. Would repeat lipids etc before doing that...3 mos probably reasonable. I don't need to see him in three weeks.  Follow-up by: Shaune Leeks MD,  October 06, 2009 1:56 PM

## 2010-08-24 NOTE — Consult Note (Signed)
Summary: Consultation Report  Consultation Report   Imported By: Mickle Asper 02/19/2007 16:23:05  _____________________________________________________________________  External Attachment:    Type:   Image     Comment:   External Document

## 2010-08-24 NOTE — Assessment & Plan Note (Signed)
Summary: CPX   Vital Signs:  Patient Profile:   65 Years Old Male Height:     69.5 inches (176.53 cm) Weight:      213 pounds Temp:     98.7 degrees F oral Pulse rate:   80 / minute Pulse rhythm:   regular BP sitting:   140 / 106  (left arm) Cuff size:   regular  Vitals Entered By: Providence Crosby (February 23, 2007 9:36 AM)               Chief Complaint:  CPX HEMOCULT CARDS TO PATIENT.  History of Present Illness: Pt really wants to hold off on BP meds , understands his risk of metabolic syndrome. He really thinks he has white coat syndrome with very normal bloodpressure at home. Is seeing Dr Achilles Dunk for prostate which has really helped.  Current Allergies (reviewed today): ! CIPROFLOXACIN HCL (CIPROFLOXACIN HCL)  Past Surgical History:    COLONOSCOPY: NORMAL (DR. SIEGAL) :(09/24/2004)    SPECT. ETT NORMAL :(12/16/2005)    EGD ESOPHAGITIS BY BX.--GASTRITIS BX. NEGATIVE H-PYLORI :(01/26/2006)    ADMITTED TO [facility] CHEST PAIN ((12/15/2005) ( DR. Juliann Pares)    RIGHT & LEFT PROSTATE NEEDLE BIOPSY/ ACUTE INFLAMMATION:(DR. TANNENBAUM) :(05/02/2003)   Family History:    Father: DECEASED 14 YOA CHF MI SECOND TO SCARLET & RHEMATIC FEVER    MITAL VALVE SCARRED(threw clot)    Mother: ALIVE AT 22 YOA  BREAST CANCER 2001 CREST Syndr    Siblings: 1 BROTHER/ 1 SISTER WITH NARROW EOSINOPHILLIC  ESOPHAGITIS    CV: + FATHER MI    HBP: + UNCLES, MGF    DM: NEGATIVE    GOUT/ARTHRITIS:    PROSTATE CANCER:    BREAST/OVARIAN/UTERINE CANCER: + MOTHER BREAST    COLON CANCER:    DEPRESSION: NEGATIVE    ETOH/DRUG ABUSE: NEGATIVE    OTHER: NEGATIVE STROKE    GRANDMOTHER: + GLAUCOM AT 53 YOA   Risk Factors:  Passive smoke exposure:  no Drug use:  no HIV high-risk behavior:  no Caffeine use:  1 drinks per day Alcohol use:  yes    Type:  BEER ON WKND 3/DAY    Drinks per day:  0    Has patient --       Felt need to cut down:  no       Been annoyed by complaints:  no       Felt guilty about  drinking:  no       Needed eye opener in the morning:  no    Counseled to quit/cut down alcohol use:  no Exercise:  yes    Times per week:  4    Type:  GOLF Seatbelt use:  100 %   Review of Systems  General      Denies chills, fatigue, fever, loss of appetite, malaise, sleep disorder, sweats, weakness, and weight loss.  Eyes      Denies blurring, discharge, double vision, eye irritation, eye pain, halos, itching, light sensitivity, red eye, vision loss-1 eye, and vision loss-both eyes.      WEARS GLASSES  ENT      Complains of decreased hearing.      Denies difficulty swallowing, ear discharge, earache, hoarseness, nasal congestion, nosebleeds, postnasal drainage, ringing in ears, sinus pressure, and sore throat.      MILD  CV      Denies bluish discoloration of lips or nails, chest pain or discomfort, difficulty breathing while lying down, fainting, fatigue,  leg cramps with exertion, lightheadness, near fainting, palpitations, shortness of breath with exertion, swelling of feet, swelling of hands, and weight gain.  Resp      Denies chest discomfort, chest pain with inspiration, cough, coughing up blood, excessive snoring, hypersomnolence, morning headaches, pleuritic, shortness of breath, sputum productive, and wheezing.  GI      Denies abdominal pain, bloody stools, change in bowel habits, constipation, dark tarry stools, diarrhea, excessive appetite, gas, hemorrhoids, indigestion, loss of appetite, nausea, vomiting, vomiting blood, and yellowish skin color.  GU      Denies decreased libido, discharge, dysuria, erectile dysfunction, genital sores, hematuria, incontinence, nocturia, urinary frequency, and urinary hesitancy.      MILDLY DECREASED STREAM.  MS      R HIP PAIN  Derm      SEES DERM REGULARLY   Physical Exam  General:     Well-developed,well-nourished,in no acute distress; alert,appropriate and cooperative throughout examination Head:     Normocephalic and  atraumatic without obvious abnormalities. No apparent alopecia or balding. Eyes:     Conjunctiva clear bilaterally.  Ears:     External ear exam shows no significant lesions or deformities.  Otoscopic examination reveals clear canals, tympanic membranes are intact bilaterally without bulging, retraction, inflammation or discharge. Hearing is grossly normal bilaterally. Nose:     External nasal examination shows no deformity or inflammation. Nasal mucosa are pink and moist without lesions or exudates. Mouth:     Oral mucosa and oropharynx without lesions or exudates.  Teeth in good repair. Neck:     No deformities, masses, or tenderness noted. Chest Wall:     No deformities, masses, tenderness or gynecomastia noted. Breasts:     No masses or gynecomastia noted Lungs:     Normal respiratory effort, chest expands symmetrically. Lungs are clear to auscultation, no crackles or wheezes. Heart:     Normal rate and regular rhythm. S1 and S2 normal without gallop, murmur, click, rub or other extra sounds. Abdomen:     Bowel sounds positive,abdomen soft and non-tender without masses, organomegaly or hernias noted. Rectal:     No external abnormalities noted. Normal sphincter tone. No rectal masses or tenderness. Genitalia:     Testes bilaterally descended without nodularity, tenderness or masses. No scrotal masses or lesions. No penis lesions or urethral discharge. Prostate:     Prostate gland firm and smooth, no enlargement, nodularity, tenderness, mass, asymmetry or induration. Msk:     No deformity or scoliosis noted of thoracic or lumbar spine.   Pulses:     R and L carotid,radial,femoral,dorsalis pedis and posterior tibial pulses are full and equal bilaterally Extremities:     No clubbing, cyanosis, edema, or deformity noted with normal full range of motion of all joints.   Neurologic:     No cranial nerve deficits noted. Station and gait are normal. Plantar reflexes are down-going  bilaterally. DTRs are symmetrical throughout. Sensory, motor and coordinative functions appear intact. Skin:     Intact without suspicious lesions or rashes Cervical Nodes:     No lymphadenopathy noted Inguinal Nodes:     No significant adenopathy Psych:     Cognition and judgment appear intact. Alert and cooperative with normal attention span and concentration. No apparent delusions, illusions, hallucinations    Impression & Recommendations:  Problem # 1:  HEALTH MAINTENANCE EXAM (ICD-V70.0) Assessment: Comment Only  Problem # 2:  ALLERGIC RHINITIS, CHRONIC (ICD-477.9) Assessment: Improved Stable. His updated medication list for this problem includes:  Flonase 50 Mcg/act Susp (Fluticasone propionate) .Marland Kitchen... 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  Problem # 4:  PROSTATITIS, CHRONIC (ICD-601.1) Assessment: Improved  Problem # 5:  BENIGN PROSTATIC HYPERTROPHY ( DR COPE) (ICD-600.00) Assessment: Improved  Problem # 6:  HYPERTENSION (ICD-401.9) Assessment: Deteriorated Really needs treatment but pt refuses. BP today: 140/106 Prior BP: 148/90 (10/06/1997)   Problem # 7:  METABOLIC SYNDROME X (ICD-277.7) Assessment: Unchanged Discussed at length.  Complete Medication List: 1)  Avodart 0.5 Mg Caps (Dutasteride) .Marland Kitchen.. 1 qd 2)  Flonase 50 Mcg/act Susp (Fluticasone propionate) .... 2 sprays each nostril qd 3)  Alprazolam 0.5 Mg Tb24 (Alprazolam) .Marland Kitchen.. 1 bid 4)  Omeprazole 20 Mg Cpdr (Omeprazole) .... 2 capsules daily   Patient Instructions: 1)  RTC 3 mos or so. Bring list of BPs at home for one month. 2)  Have labs done at [facility] as ordered.

## 2010-08-24 NOTE — Progress Notes (Signed)
Summary: Fax from patient/lab results  Phone Note Call from Patient Call back at [phone]   Caller: Patient Call For: Clayton Leeks MD Summary of Call: Patient called back to let you that he has gotten some of his lab work back and his total cholesterol was 115, trigly 69, HDL 61, calculate LDL 40, ALT 102, AST 93, both are high.  See fax from patient. Initial call taken by: Sydell Axon LPN,  July 28, 2009 4:04 PM  Follow-up for Phone Call        Patient called back to say that if you want to start him on a difference cholesterol med he would like a written script to mail to [facility] and a 30 day supply sent to [facility] to last him until he gets his mail order. Sydell Axon LPN  July 29, 2009 4:20 PM  Agree with Dr Karrie Meres with Simvastatin 20mg  at night. Scrpts done. Needs LFTs in 2 weeks and 6 weeks due to minor elevation and then LFTs and Chol Profile in 3 mos. See me then to discuss. Follow-up by: Clayton Leeks MD,  July 30, 2009 7:56 AM  Additional Follow-up for Phone Call Additional follow up Details #1::        Left message on machine for patient to call back. Sydell Axon LPN  July 30, 2009 10:03 AM  Patient notified as instructed by telephone. Patient stated that he will get all the lab work done at [facility] and forward the results to Dr. Hetty Ely and will schedule his follow-up appt. also. Patient aware that rx is up front and ready for pickup. Additional Follow-up by: Sydell Axon LPN,  July 30, 2009 3:24 PM    New/Updated Medications: SIMVASTATIN 20 MG TABS (SIMVASTATIN) one tab by mouth at night Prescriptions: SIMVASTATIN 20 MG TABS (SIMVASTATIN) one tab by mouth at night  #30 x 3   Entered and Authorized by:   Clayton Leeks MD   Signed by:   Clayton Leeks MD on 07/30/2009   Method used:   Electronically to        [facility] (retail)       [address]       [place], [place]  [address]       Ph:  [phone] or [phone]       Fax: [phone]   RxID:   [id number] SIMVASTATIN 20 MG TABS (SIMVASTATIN) one tab by mouth at night  #90 x 3   Entered and Authorized by:   Clayton Leeks MD   Signed by:   Clayton Leeks MD on 07/30/2009   Method used:   Print then Give to Patient   RxID:   [id number]

## 2010-08-24 NOTE — Assessment & Plan Note (Signed)
Summary: CPX/BIR   Vital Signs:  Patient profile:   65 year old male Weight:      186 pounds Temp:     97.9 degrees F oral Pulse rate:   72 / minute Pulse rhythm:   regular BP sitting:   114 / 80  (left arm) Cuff size:   regular  Vitals Entered By: Sydell Axon LPN (2009/09/01 8:22 AM) CC: 30 Minute checkup, had a colonoscopy in 03/06   History of Present Illness: Pt here for Comp Exam...he's doing well. he feels well and has been doing great.  Has some right lateral ankle discomfort after twisting while playing golf. Discussed exercises.  Preventive Screening-Counseling & Management  Alcohol-Tobacco     Alcohol drinks/day: 0     Alcohol type: BEER ON WKND 3/DAY     Smoking Status: quit     Passive Smoke Exposure: no  Caffeine-Diet-Exercise     Caffeine use/day: 0     Does Patient Exercise: yes     Type of exercise: GOLF     Exercise (avg: min/session): 9:45     Times/week: 4  Problems Prior to Update: 1)  Back Pain, Upper Right Scapular Region  (ICD-724.5) 2)  Cad, Native Vessel  (ICD-414.01) 3)  Unspecified Heart Disease  (ICD-429.9) 4)  Cerumen Impaction, Bilateral  (ICD-380.4) 5)  Metabolic Syndrome X  (ICD-277.7) 6)  Health Maintenance Exam  (ICD-V70.0) 7)  Allergic Rhinitis, Chronic  (ICD-477.9) 8)  Cystitis, 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 1by Mouth Twice A Week 2)  Flonase 50 Mcg/act  Susp (Fluticasone Propionate) .... 2 Sprays Each Nostril Daily 3)  Alprazolam 0.5 Mg  Tb24 (Alprazolam) .Marland Kitchen.. 1 Twice A Day By Mouth 4)  Omeprazole 20 Mg  Cpdr (Omeprazole) .Marland Kitchen.. 1 Capsules Daily 5)  Vitamin D 1000 Unit  Tabs (Cholecalciferol) .... Take 1 Tablet By Mouth Two Times A Day 6)  Fish  Oil Concentrate 1000 Mg  Caps (Omega-3 Fatty Acids) .... Take 2 Capsule By Mouth Two Times A Day 7)  Ipratropium Bromide 0.03 % Soln (Ipratropium Bromide) .Marland Kitchen.. 1 Spray Daily 8)  Multivitamins  Tabs (Multiple Vitamin) .Marland Kitchen.. 1 Daily By Mouth 9)  Cissus .... Takes Daily O.t.c. 10)  Aspirin 81 Mg Tbec (Aspirin) .... Take One By Mouth Daily 11)  Simvastatin 20 Mg Tabs (Simvastatin) .... One Tab By Mouth At Night 12)  Trilipix 135 Mg Cpdr (Choline Fenofibrate) .... Take One By Mouth Daily  Allergies: 1)  ! Ciprofloxacin Hcl (Ciprofloxacin Hcl) 2)  ! Charolette Child  Past History:  Past Medical History: Last updated: 03/16/2009 GERD: 06/2004 Hyperlipidemia: 09/1997 Hypertension: 2002 Benign prostatic hypertrophy: 12/2005 CAD: Dx made by coronary calcification 2010  Past Surgical History: Last updated: 02/23/2007 COLONOSCOPY: NORMAL (DR. Sharen Hint) :(09/24/2004) SPECT. ETT NORMAL :(12/16/2005) EGD ESOPHAGITIS BY BX.--GASTRITIS BX. NEGATIVE H-PYLORI :(01/26/2006) ADMITTED TO [facility] CHEST PAIN ((12/15/2005) ( DR. Juliann Pares) RIGHT & LEFT PROSTATE NEEDLE BIOPSY/ ACUTE INFLAMMATION:(DR. TANNENBAUM) :(05/02/2003)  Family History: Last updated: September 01, 2009 Father: DECEASED 62 YOA CHF MI VALVE REPL DUE TO SCARLET & RHEMATIC FEVER MITAL VALVE SCARRED(threw clot) Mother: A  18   BREAST CANCER 2001 CREST Syndr MI BROTHER A 52 SISTER A 56  NARROW EOSINOPHILLIC  ESOPHAGITIS CV: + FATHER  MI HBP: + UNCLES, MGF DM: NEGATIVE GOUT/ARTHRITIS: PROSTATE CANCER: BREAST/OVARIAN/UTERINE CANCER: + MOTHER BREAST COLON CANCER: DEPRESSION: NEGATIVE ETOH/DRUG ABUSE: NEGATIVE OTHER: NEGATIVE STROKE GRANDMOTHER: + GLAUCOM AT 7 YOA  Social History: Last updated: 02/16/2007 Marital Status: Married  LIVES WITH WIFE Children: 1 AT Automotive engineer [facility] [employer] 1 AT HOME 50 YOA Occupation: LAB DIRECTOR [employer]  Risk Factors: Alcohol Use: 0 (08/13/2009) Caffeine Use: 0  (08/13/2009) Exercise: yes (08/13/2009)  Risk Factors: Smoking Status: quit (08/13/2009) Passive Smoke Exposure: no (08/13/2009)  Family History: Father: DECEASED 75 YOA CHF MI VALVE REPL DUE TO SCARLET & RHEMATIC FEVER MITAL VALVE SCARRED(threw clot) Mother: A  34   BREAST CANCER 2001 CREST Syndr MI BROTHER A 52 SISTER A 56  NARROW EOSINOPHILLIC  ESOPHAGITIS CV: + FATHER MI HBP: + UNCLES, MGF DM: NEGATIVE GOUT/ARTHRITIS: PROSTATE CANCER: BREAST/OVARIAN/UTERINE CANCER: + MOTHER BREAST COLON CANCER: DEPRESSION: NEGATIVE ETOH/DRUG ABUSE: NEGATIVE OTHER: NEGATIVE STROKE GRANDMOTHER: + GLAUCOM AT 52 YOA  Social History: Caffeine use/day:  0  Review of Systems General:  Denies chills, fatigue, fever, sweats, weakness, and weight loss. Eyes:  Denies blurring, discharge, eye pain, and light sensitivity; mild dry eye. ENT:  Denies decreased hearing, ear discharge, earache, nasal congestion, and nosebleeds. CV:  Denies chest pain or discomfort, fainting, fatigue, leg cramps with exertion, and palpitations. Resp:  Denies chest discomfort, cough, shortness of breath, and wheezing. GI:  Complains of indigestion; denies abdominal pain, bloody stools, change in bowel habits, constipation, dark tarry stools, diarrhea, loss of appetite, nausea, vomiting, vomiting blood, and yellowish skin color; chronic. GU:  Denies discharge, dysuria, nocturia, and urinary frequency. Derm:  Denies dryness, itching, and rash. Neuro:  Denies numbness, poor balance, tingling, and tremors.  Physical Exam  General:  Well-developed,well-nourished,in no acute distress; alert,appropriate and cooperative throughout examination, thinner and trim! Head:  Normocephalic and atraumatic without obvious abnormalities. No apparent alopecia or balding but thinning of the hair. Eyes:  Conjunctiva clear bilaterally.  Ears:  External ear exam shows no significant lesions or deformities.  Otoscopic examination reveals clear  canals, tympanic membranes are intact bilaterally without bulging, retraction, inflammation or discharge. Hearing is grossly normal bilaterally. Excessive cerumen bilaterally. Nose:  External nasal examination shows no deformity or inflammation. Nasal mucosa are pink and moist without lesions or exudates. Mucosa look good today. Mouth:  Oral mucosa and oropharynx without lesions or exudates.  Teeth in good repair. Neck:  No deformities, masses, or tenderness noted. Chest Wall:  No deformities, masses, tenderness or gynecomastia noted. Breasts:  No masses or gynecomastia noted Lungs:  Normal respiratory effort, chest expands symmetrically. Lungs are clear to auscultation, no crackles or wheezes. Heart:  Normal rate and regular rhythm. S1 and S2 normal without gallop, murmur, click, rub or other extra sounds. Abdomen:  Bowel sounds positive,abdomen soft and non-tender without masses, organomegaly or hernias noted. Rectal:  Not done. Done by Dr Achilles Dunk last month. Genitalia:  Not done. Prostate:  Not done. Done by Dr Achilles Dunk last month. Msk:  No deformity or scoliosis noted of thoracic or lumbar spine.   Pulses:  R and L carotid,radial,femoral,dorsalis pedis and posterior tibial pulses are full and equal bilaterally Extremities:  No clubbing, cyanosis, edema, or deformity noted with normal full range of motion of all joints.  Mild discomfort of the lateral right ankle/foot...discussed str exercises. Neurologic:  No cranial nerve deficits noted. Station and gait are normal. Plantar reflexes are down-going bilaterally. DTRs are symmetrical throughout. Sensory, motor and  coordinative functions appear intact. Skin:  Intact without suspicious lesions or rashes Cervical Nodes:  No lymphadenopathy noted Inguinal Nodes:  No significant adenopathy Psych:  Cognition and judgment appear intact. Alert and cooperative with normal attention span and concentration. No apparent delusions, illusions,  hallucinations   Impression & Recommendations:  Problem # 1:  HEALTH MAINTENANCE EXAM (ICD-V70.0) Assessment Comment Only  Problem # 2:  CAD, NATIVE VESSEL (ICD-414.01) Assessment: Unchanged Stable, risk lowered by therapy in place. His updated medication list for this problem includes:    Aspirin 81 Mg Tbec (Aspirin) .Marland Kitchen... Take one by mouth daily  Problem # 3:  METABOLIC SYNDROME X (ICD-277.7) Assessment: Improved Stable.  Problem # 4:  ALLERGIC RHINITIS, CHRONIC (ICD-477.9) Assessment: Unchanged Stable on Curr meds. His updated medication list for this problem includes:    Flonase 50 Mcg/act Susp (Fluticasone propionate) .Marland Kitchen... 2 sprays each nostril daily    Ipratropium Bromide 0.03 % Soln (Ipratropium bromide) .Marland Kitchen... 1 spray daily    Loratadine 10 Mg Tabs (Loratadine) .Marland Kitchen... Take one by mouth daily  Problem # 5:  CERUMEN IMPACTION, BILATERAL (ICD-380.4) Assessment: Unchanged Discussed technique of irrigation. May want to add H2O2.  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  Problem # 7:  BENIGN PROSTATIC HYPERTROPHY ( DR COPE) (ICD-600.00) Assessment: Unchanged Per Dr Achilles Dunk.  Problem # 8:  HYPERTENSION (ICD-401.9) Assessment: Improved Weight loss has helped. 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 Labs to be scanned in. His updated medication list for this problem includes:    Simvastatin 20 Mg Tabs (Simvastatin) ..... One tab by mouth at night    Trilipix 135 Mg Cpdr (Choline fenofibrate) .Marland Kitchen... Take one by mouth daily  Problem # 10:  GERD (ICD-530.81) Assessment: Unchanged Stable on curr med. His updated medication list for this problem includes:    Omeprazole 20 Mg Cpdr (Omeprazole) .Marland Kitchen... 1 capsules daily  Complete Medication List: 1)  Avodart 0.5 Mg Caps (Dutasteride) .Marland Kitchen.. 1by mouth twice a week 2)  Flonase 50  Mcg/act Susp (Fluticasone propionate) .... 2 sprays each nostril daily 3)  Alprazolam 0.5 Mg Tb24 (Alprazolam) .Marland Kitchen.. 1 twice a day by mouth 4)  Omeprazole 20 Mg Cpdr (Omeprazole) .Marland Kitchen.. 1 capsules daily 5)  Vitamin D 1000 Unit Tabs (Cholecalciferol) .... Take 1 tablet by mouth two times a day 6)  Fish Oil Concentrate 1000 Mg Caps (Omega-3 fatty acids) .... Take 1 capsule by mouth two times a day 7)  Ipratropium Bromide 0.03 % Soln (Ipratropium bromide) .Marland Kitchen.. 1 spray daily 8)  Multivitamins Tabs (Multiple vitamin) .Marland Kitchen.. 1 daily by mouth 9)  Aspirin 81 Mg Tbec (Aspirin) .... Take one by mouth daily 10)  Simvastatin 20 Mg Tabs (Simvastatin) .... One tab by mouth at night 11)  Trilipix 135 Mg Cpdr (Choline fenofibrate) .... Take one by mouth daily 12)  Loratadine 10 Mg Tabs (Loratadine) .... Take one by mouth daily 13)  Citracal Plus Tabs (Multiple minerals-vitamins) .... Take one by mouth daily 14)  Multivitamins Tabs (Multiple vitamin) .... Take one by mouth daily  Patient Instructions: 1)  Great guy who has really taken control of his lipids. He still has risk factors but is significantly improved over when we started. 2)  His LFTs are elevated, moreso on Simva than on Lipitor. He will recheck them in another month and may be enough to petition to go back on Lipitor or maybe  even lower dose Crestor. 3)  Discussed transition to new Dr.  Current Allergies (reviewed today): ! CIPROFLOXACIN HCL (CIPROFLOXACIN HCL) ! Charolette Child

## 2010-08-24 NOTE — Assessment & Plan Note (Signed)
Summary: 6 MONTH FOLLOW UP / LFW   Vital Signs:  Patient profile:   65 year old male Height:      69.5 inches Weight:      214 pounds BMI:     31.26 Temp:     96.6 degrees F oral Pulse rate:   72 / minute BP sitting:   130 / 80  (left arm)  History of Present Illness: Pt here for 6 month followup. Doing well with no complaints. He now has person working for him and had calcium IMD of Carotid and further lab elucidation of Lipids. He has been working hard on chol but still not exercising regularly other than playing golf, riding in cart.  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 1 Daily By Mouth 2)  Flonase 50 Mcg/act  Susp (Fluticasone Propionate) .... 2 Sprays Each Nostril Qd 3)  Alprazolam 0.5 Mg  Tb24 (Alprazolam) .Marland Kitchen.. 1 Twice A Day By Mouth 4)  Omeprazole 20 Mg  Cpdr (Omeprazole) .... 2 Capsules Daily 5)  Vitamin D 1000 Unit  Tabs (Cholecalciferol) .... Take 1 Tablet By Mouth Two Times A Day 6)  Fish Oil Concentrate 1000 Mg  Caps (Omega-3 Fatty Acids) .... Take 1 Capsule By Mouth Two Times A Day 7)  Ipratropium Bromide 0.03 % Soln (Ipratropium Bromide) .Marland Kitchen.. 1 Spray Daily 8)  Multivitamins  Tabs (Multiple Vitamin) .Marland Kitchen.. 1 Daily By Mouth 9)  Cissus .... Takes Daily O.t.c.  Allergies: 1)  ! Ciprofloxacin Hcl (Ciprofloxacin Hcl)  Physical Exam  General:  Well-developed,well-nourished,in no acute distress; alert,appropriate and cooperative throughout examination Head:  Normocephalic and atraumatic without  obvious abnormalities. No apparent alopecia or balding. Eyes:  Conjunctiva clear bilaterally.  Ears:  External ear exam shows no significant lesions or deformities.  Otoscopic examination reveals clear canals, tympanic membranes are intact bilaterally without bulging, retraction, inflammation or discharge. Hearing is grossly normal bilaterally. Excessive cerumen bilaterally. Nose:  External nasal examination shows no deformity or inflammation. Nasal mucosa are pink and moist without lesions or exudates. Mouth:  Oral mucosa and oropharynx without lesions or exudates.  Teeth in good repair. Neck:  No deformities, masses, or tenderness noted. Chest Wall:  No deformities, masses, tenderness or gynecomastia noted. Lungs:  Normal respiratory effort, chest expands symmetrically. Lungs are clear to auscultation, no crackles or wheezes. Heart:  Normal rate and regular rhythm. S1 and S2 normal without gallop, murmur, click, rub or other extra sounds.   Impression & Recommendations:  Problem # 1:  METABOLIC SYNDROME X (ICD-277.7) Assessment Unchanged Stable.  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  Complete Medication List: 1)  Avodart 0.5 Mg Caps (Dutasteride) .Marland Kitchen.. 1 daily by mouth 2)  Flonase 50 Mcg/act Susp (Fluticasone propionate) .... 2 sprays each nostril qd 3)  Alprazolam 0.5 Mg Tb24 (Alprazolam) .Marland Kitchen.. 1 twice a day by mouth 4)  Omeprazole 20 Mg Cpdr (Omeprazole) .... 2 capsules  daily 5)  Vitamin D 1000 Unit Tabs (Cholecalciferol) .... Take 1 tablet by mouth two times a day 6)  Fish Oil Concentrate 1000 Mg Caps (Omega-3 fatty acids) .... Take 1 capsule by mouth two times a day 7)  Ipratropium Bromide 0.03 % Soln (Ipratropium bromide) .Marland Kitchen.. 1 spray daily 8)  Multivitamins Tabs (Multiple vitamin) .Marland Kitchen.. 1 daily by  mouth 9)  Cissus  .... Takes daily o.t.c.  Other Orders: Tdap => 34yrs IM (16109) Admin 1st Vaccine (60454) Zoster (Shingles) Vaccine Live [phone]) Admin of Any Addtl Vaccine (91478)  Patient Instructions: 1)  RTC 6 mos for Comp Exam, labs prior. 2)  Zostavax and Tdap today.     Tetanus/Td Vaccine    Vaccine Type: Tdap    Site: left deltoid    Mfr: GlaxoSmithKline    Dose: 0.5 ml    Route: IM    Given by: Providence Crosby LPN    Exp. Date: 05/28/2011    Lot #: [id number]    VIS given: 06/12/07 version given February 09, 2009.   Zostavax # 1    Vaccine Type: Zostavax    Site: right deltoid    Mfr: Merck    Dose: 0.5 ml    Route: [place]    Given by: Providence Crosby LPN    Exp. Date: 11/19/2009    Lot #: [id number]    VIS given: 05/06/05 given February 09, 2009.

## 2010-08-24 NOTE — Assessment & Plan Note (Signed)
Summary: Cardiology Nuclear Study  Nuclear Med Background Indications for Stress Test: Evaluation for Ischemia  Indications Comments: Evaluation for CAD due to poss. extensive coronary calcification by recent cardiac CT on 03/12/09.  History: CT/MRI      Nuclear Pre-Procedure Cardiac Risk Factors: Family History - CAD, Hypertension, Lipids Caffeine/Decaff Intake: none NPO After: 8:30 PM Lungs: clear IV 0.9% NS with Angio Cath: 22g     IV Site: (R) wrist IV Started by: Irean Hong RN Chest Size (in) 42     Height (in): 68 Weight (lb): 198 BMI: 30.21  Nuclear Med Study 1 or 2 day study:  1 day     Stress Test Type:  Stress Reading MD:  Arvilla Meres, MD     Referring MD:  M.Cooper Resting Radionuclide:  Technetium 87m Tetrofosmin     Resting Radionuclide Dose:  11 mCi  Stress Radionuclide:  Technetium 44m Tetrofosmin     Stress Radionuclide Dose:  33 mCi   Stress Protocol Exercise Time (min):  9:45 min     Max HR:  139 bpm     Predicted Max HR: 158 bpm  Max Systolic BP: 204 mm Hg     % Max HR:  87 %     METS: 10.70 Rate Pressure Product:  56213    Stress Test Technologist:  Milana Na EMT-P     Nuclear Technologist:  Domenic Polite CNMT  Rest Procedure  Myocardial perfusion imaging was performed at rest 45 minutes following the intraveneous administration of Myoview Technetium 24m Tetrofosmin.  Stress Procedure  The patient exercised for 9:45. The patient stopped due to fatigue, sob, and denied any chest pain.  There were no significant ST-T wave changes.  Myoview was injected at peak exercise and myocardial perfusion imaging was performed after a brief delay.  QPS Raw Data Images:  Normal; no motion artifact; normal heart/lung ratio. Stress Images:  There is normal uptake in all areas. Rest Images:  Normal homogeneous uptake in all areas of the myocardium. Subtraction (SDS):  Normal Transient Ischemic Dilatation:  .97  (Normal <1.22)  Lung/Heart Ratio:  .28   (Normal <0.45)  Quantitative Gated Spect Images QGS EDV:  85 ml QGS ESV:  29 ml QGS EF:  66 % QGS cine images:  Normal  Findings Normal nuclear study      Overall Impression  Exercise Capacity: Fair exercise capacity. BP Response: Normal blood pressure response. Clinical Symptoms: Dyspnea. + dyspnea. Overall Impression: Normal stress nuclear study.  Appended Document: Cardiology Nuclear Study pt notified of results. Tula Nakayama RN

## 2010-08-24 NOTE — Progress Notes (Signed)
Summary: needs cardio consult  Phone Note Call from Patient   Caller: Patient Summary of Call: Pt saw a cardiologist in [place] today who told him he has a calcium score of 1300, and told him that he needs a cardio consult.  He wants to see someone in [place] or [place] asap. Initial call taken by: Lowella Petties CMA,  March 12, 2009 4:45 PM  Follow-up for Phone Call        agree that is reasonable Follow-up by: Hannah Beat MD,  March 12, 2009 4:55 PM  New Problems: UNSPECIFIED HEART DISEASE (ICD-429.9)   New Problems: UNSPECIFIED HEART DISEASE (ICD-429.9)

## 2010-08-24 NOTE — Assessment & Plan Note (Signed)
Summary: sharp pain in back/rbh   Vital Signs:  Patient profile:   65 year old male Weight:      189 pounds Temp:     98.2 degrees F oral Pulse rate:   72 / minute Pulse rhythm:   regular BP sitting:   130 / 88  (left arm) Cuff size:   regular  Vitals Entered By: Sydell Axon LPN (June 03, 2009 8:52 AM) CC: Pain in upper back and right shoulder blade   History of Present Illness: Pt here for pain in rigtht upper back which feels like superficial tingling just below the superior border of the scapula just lateral of midline, maked in 2 places by magic marker by his wife. 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 He had calcium CT of the heart which "lit up like a Christmas tree" and has been trying to alter cardiac risk factors since. His nos are pretty good but he is still trying to do better. The pain in the back does not seem mitigated by activity. It is nagging, not debillitating and doesn't last long...secs to minutes. It does not hinder activity, he canniot make it happen and simple ROM of shoulder and back helps when it occurs. He has not had rash. He had Zostavax with last PE.  Allergies: 1)  ! Ciprofloxacin Hcl (Ciprofloxacin Hcl) 2)  ! Charolette Child  Physical Exam  General:  Well-developed,well-nourished,in no acute distress; alert,appropriate and cooperative throughout examination Head:  Normocephalic and atraumatic without obvious abnormalities. No apparent alopecia or balding. Eyes:  Conjunctiva clear bilaterally.  Ears:  External ear exam shows no significant lesions or deformities.  Otoscopic examination reveals clear canals, tympanic membranes are intact bilaterally without bulging, retraction, inflammation or discharge. Hearing is grossly normal bilaterally. Excessive cerumen bilaterally. Nose:  External nasal examination shows no deformity or  inflammation. Nasal mucosa are pink and moist without lesions or exudates. Mouth:  Oral mucosa and oropharynx without lesions or exudates.  Teeth in good repair. Neck:  No deformities, masses, or tenderness noted. Msk:  Dawna Part are over area just below superior border of and just lateral to middle of right scapula.nNo rash seen. ROM of shoulder, back and neck nml. No swelling, erythema or warmth.   Impression & Recommendations:  Problem # 1:  BACK PAIN, UPPER RIGHT SCAPULAR REGION (ICD-724.5) Assessment New  Poss shingles without rash, opss muscle pull, poss trapezial discomfort from gen anxiuosness. Observe and let declare itself. His updated medication list for this problem includes:    Aspirin 81 Mg Tbec (Aspirin) .Marland Kitchen... Take one by mouth daily  Discussed use of moist heat or ice, modified activities, medications, and stretching/strengthening exercises. Back care instructions given. To be seen in 2 weeks if no improvement; sooner if worsening of symptoms.   Complete Medication List: 1)  Avodart 0.5 Mg Caps (Dutasteride) .Marland Kitchen.. 1by mouth twice a week 2)  Flonase 50 Mcg/act Susp (Fluticasone propionate) .... 2 sprays each nostril daily 3)  Alprazolam 0.5 Mg Tb24 (Alprazolam) .Marland Kitchen.. 1 twice a day by mouth 4)  Omeprazole 20 Mg Cpdr (Omeprazole) .Marland Kitchen.. 1 capsules daily 5)  Vitamin D 1000 Unit Tabs (Cholecalciferol) .... Take 1 tablet by mouth two times a day 6)  Fish Oil Concentrate 1000 Mg Caps (Omega-3 fatty acids) .... Take 2 capsule by mouth two times a day 7)  Ipratropium Bromide 0.03 % Soln (  Ipratropium bromide) .Marland Kitchen.. 1 spray daily 8)  Multivitamins Tabs (Multiple vitamin) .Marland Kitchen.. 1 daily by mouth 9)  Cissus  .... Takes daily o.t.c. 10)  Aspirin 81 Mg Tbec (Aspirin) .... Take one by mouth daily 11)  Lipitor 10 Mg Tabs (Atorvastatin calcium) .... Take one by mouth daily 12)  Trilipix 135 Mg Cpdr (Choline fenofibrate) .... Take one by mouth daily  Patient Instructions: 1)  25 mins spent with  pt. 2)  Reassured. 3)  RTC if sxs worsen.  Current Allergies (reviewed today): ! CIPROFLOXACIN HCL (CIPROFLOXACIN HCL) ! Charolette Child  Appended Document: sharp pain in back/rbh 50% of FTF time with pt spen discussing Cardiac condition and reports/results.

## 2010-08-24 NOTE — Progress Notes (Signed)
Summary: liver functons elevated  Phone Note Call from Patient   Caller: Patient Call For: Shaune Leeks MD Summary of Call: Pt has faxed his lab results from [facility].  AST and ALT are extremely high at 392 and 497.  He is not going to take his cholesterol medicine this week end.  Results are on your shelf. Initial call taken by: Lowella Petties CMA,  September 04, 2009 12:49 PM  Follow-up for Phone Call        Left msg for pt to stop Chol meds (Simvastatin and Trilipix) for one month and recheck...then decide what to do. He may have spoken with Dr Karrie Meres in the interim. Follow-up by: Shaune Leeks MD,  September 07, 2009 8:12 AM

## 2010-08-24 NOTE — Letter (Signed)
Summary: Note from pt. and e-mail sent to pt. by Dr.Cromwell  Note from pt. and e-mail sent to pt. by Dr.Cromwell   Imported By: Beau Fanny 07/30/2009 11:28:12  _____________________________________________________________________  External Attachment:    Type:   Image     Comment:   External Document

## 2010-08-24 NOTE — Assessment & Plan Note (Signed)
Summary: 2:15  DIZZY,EAR/CLE   Vital Signs:  Patient Profile:   65 Years Old Male Height:     69.5 inches (176.53 cm) Weight:      200 pounds Temp:     98.1 degrees F oral Pulse rate:   80 / minute Pulse rhythm:   regular BP sitting:   152 / 92  (left arm) Cuff size:   regular  Vitals Entered By: Delilah Shan (October 08, 2007 2:22 PM)                 Chief Complaint:  Dizziness and ears hurting.  Acute Visit History:      The patient complains of cough, nasal discharge, and sinus problems.  These symptoms began 1 week ago.  He denies earache and fever.  Other comments include: PND feels like head in a tunnel, hearing obscured mainly on left uses flonase, ? ipratroprium nasal spray.        The character of the cough is described as slight.        He complains of sinus pressure, ears being blocked, and nasal congestion.  The patient has had a past history of sinusitis.  He denies sinusitis in the last 2 months.           Current Allergies (reviewed today): ! CIPROFLOXACIN HCL (CIPROFLOXACIN HCL)  Past Medical History:    Reviewed history from 02/16/2007 and no changes required:       GERD: 06/2004       Hyperlipidemia: 09/1997       Hypertension: 2002       Benign prostatic hypertrophy: 12/2005     Review of Systems      See HPI   Physical Exam  General:     Well-developed,well-nourished,in no acute distress; alert,appropriate and cooperative throughout examination Head:     no maxillary pain B Eyes:     No corneal or conjunctival inflammation noted. EOMI. Perrla.Vision grossly normal. Ears:     B TMs obscured with wax, once removed TMs with clear fluid B Nose:     nasal dischargemucosal pallor.   Mouth:     Oral mucosa and oropharynx without lesions or exudates.  Teeth in good repair. Lungs:     Normal respiratory effort, chest expands symmetrically. Lungs are clear to auscultation, no crackles or wheezes. Heart:     Normal rate and regular rhythm. S1  and S2 normal without gallop, murmur, click, rub or other extra sounds. Cervical Nodes:     No lymphadenopathy noted     Impression & Recommendations:  Problem # 1:  ALLERGIC RHINITIS, CHRONIC (ICD-477.9) Treat with several days of allegra D, sample given.  NAsal saline 3-4 x daily. His updated medication list for this problem includes:    Flonase 50 Mcg/act Susp (Fluticasone propionate) .Marland Kitchen... 2 sprays each nostril qd   Problem # 2:  CERUMEN IMPACTION, BILATERAL (ICD-380.4) Hearing improved after irrigation and removal of wax with alligator forceps B.  Use OTC cerumen removal drops to complete. Orders: Cerumen Impaction Removal ([id number])   Complete Medication List: 1)  Avodart 0.5 Mg Caps (Dutasteride) .Marland Kitchen.. 1 qd 2)  Flonase 50 Mcg/act Susp (Fluticasone propionate) .... 2 sprays each nostril qd 3)  Alprazolam 0.5 Mg Tb24 (Alprazolam) .Marland Kitchen.. 1 bid 4)  Omeprazole 20 Mg Cpdr (Omeprazole) .... 2 capsules daily 5)  Vitamin D 1000 Unit Tabs (Cholecalciferol) .... Take 1 tablet by mouth two times a day 6)  Fish Oil Concentrate 1000 Mg Caps (Omega-3  fatty acids) .... Take 1 capsule by mouth two times a day     ] Current Allergies (reviewed today): ! CIPROFLOXACIN HCL (CIPROFLOXACIN HCL) Current Medications (including changes made in today's visit):  AVODART 0.5 MG  CAPS (DUTASTERIDE) 1 QD FLONASE 50 MCG/ACT  SUSP (FLUTICASONE PROPIONATE) 2 SPRAYS EACH NOSTRIL QD ALPRAZOLAM 0.5 MG  TB24 (ALPRAZOLAM) 1 BID OMEPRAZOLE 20 MG  CPDR (OMEPRAZOLE) 2 CAPSULES DAILY VITAMIN D 1000 UNIT  TABS (CHOLECALCIFEROL) Take 1 tablet by mouth two times a day FISH OIL CONCENTRATE 1000 MG  CAPS (OMEGA-3 FATTY ACIDS) Take 1 capsule by mouth two times a day

## 2010-08-24 NOTE — Progress Notes (Signed)
Summary: step therapy request form/Lipitor  Phone Note From Pharmacy   Caller: [facility] Call For: Dr. Hetty Ely  Summary of Call: Step Therapy request form on shelf. Initial call taken by: Silas Sacramento CMA,  July 06, 2009 10:14 AM  Follow-up for Phone Call        Completed form faxed to [facility] as instructed. Follow-up by: Sydell Axon LPN,  July 06, 2009 4:37 PM    Requested Lipitor. Shaune Leeks MD  July 06, 2009 1:55 PM

## 2010-08-24 NOTE — Letter (Signed)
Summary: [employer]Note  [employer]Note   Imported By: Beau Fanny 04/30/2009 13:48:55  _____________________________________________________________________  External Attachment:    Type:   Image     Comment:   External Document

## 2010-08-26 NOTE — Assessment & Plan Note (Signed)
Summary: CPX/JRR  r/s from 08/12/10   Vital Signs:  Patient profile:   65 year old male Weight:      189 pounds BMI:     28.84 Temp:     97.8 degrees F oral Pulse rate:   76 / minute Pulse rhythm:   regular BP sitting:   128 / 80  (left arm) Cuff size:   regular  Vitals Entered By: Sydell Axon LPN (August 10, 2010 8:11 AM) CC: 30 Minute checkup, had a colonoscopy 03/06   History of Present Illness: Pt here for Comp Exam. He has had blepharoplasty for relaxed lids...he will have ptosis surg in the future.  He has been very compliant with diet in the last 18 mos with unbelievable results of his Lipids. He has started a small dose of Crestor through his Lipidologist in [place] and has done well. His exercise regimen has suffered over the holidays and he has not gotten back to that yet. He generally feels really well and has no complaints today.   Preventive Screening-Counseling & Management  Alcohol-Tobacco     Alcohol drinks/day: <1     Alcohol type: BEER ON WKND 3/DAY     Smoking Status: quit     Passive Smoke Exposure: no  Caffeine-Diet-Exercise     Caffeine use/day: 0     Does Patient Exercise: yes     Type of exercise: GOLF     Exercise (avg: min/session): 9:45     Times/week: 4  Problems Prior to Update: 1)  Cad, Native Vessel  (ICD-414.01) 2)  Unspecified Heart Disease  (ICD-429.9) 3)  Cerumen Impaction, Bilateral  (ICD-380.4) 4)  Metabolic Syndrome X  (ICD-277.7) 5)  Health Maintenance Exam  (ICD-V70.0) 6)  Allergic Rhinitis, Chronic  (ICD-477.9) 7)  Cystitis, 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 1by Mouth Twice A Week 2)  Flonase 50 Mcg/act  Susp (Fluticasone Propionate) .... 2 Sprays Each  Nostril Daily 3)  Alprazolam 0.5 Mg  Tb24 (Alprazolam) .Marland Kitchen.. 1 Twice A Day By Mouth 4)  Omeprazole 20 Mg  Cpdr (Omeprazole) .Marland Kitchen.. 1 Capsules Daily 5)  Vitamin D 1000 Unit  Tabs (Cholecalciferol) .... Take 1 Tablet By Mouth Two Times A Day 6)  Fish Oil Concentrate 1000 Mg  Caps (Omega-3 Fatty Acids) .... Take 1 Capsule By Mouth Two Times A Day 7)  Ipratropium Bromide 0.03 % Soln (Ipratropium Bromide) .Marland Kitchen.. 1 Spray Daily 8)  Multivitamins  Tabs (Multiple Vitamin) .Marland Kitchen.. 1 Daily By Mouth 9)  Aspirin 81 Mg Tbec (Aspirin) .... Take One By Mouth Daily 10)  Simvastatin 20 Mg Tabs (Simvastatin) .... One Tab By Mouth At Night 11)  Trilipix 135 Mg Cpdr (Choline Fenofibrate) .... Take One By Mouth Daily 12)  Loratadine 10 Mg Tabs (Loratadine) .... Take One By Mouth Daily 13)  Citracal Plus  Tabs (Multiple Minerals-Vitamins) .... Take One By Mouth Daily 14)  Multivitamins  Tabs (Multiple Vitamin) .... Take One By Mouth Daily  Allergies: 1)  ! Ciprofloxacin Hcl (Ciprofloxacin Hcl) 2)  ! Charolette Child  Past History:  Past Surgical History: COLONOSCOPY: NORMAL (DR. Sharen Hint) :(09/24/2004) SPECT. ETT NORMAL :(12/16/2005) EGD ESOPHAGITIS BY BX.--GASTRITIS BX. NEGATIVE H-PYLORI :(01/26/2006) ADMITTED TO [facility] CHEST PAIN ((12/15/2005) ( DR. Juliann Pares) RIGHT &  LEFT PROSTATE NEEDLE BIOPSY/ ACUTE INFLAMMATION:(DR. TANNENBAUM) :(05/02/2003) Blepharoplasty (Dr Kemper Durie) 04/2010  Family History: Father: DECEASED 80 YOA CHF MI VALVE REPL DUE TO SCARLET & RHEMATIC FEVER MITAL VALVE SCARRED(threw clot) Mother: A  86   BREAST CANCER 2001  CREST SYNDR   MI BROTHER A 53  SISTER A 57  NARROW  EOSINOPHILLIC  ESOPHAGITIS CV: + FATHER MI HBP: + UNCLES, MGF DM: NEGATIVE GOUT/ARTHRITIS: PROSTATE CANCER: BREAST/OVARIAN/UTERINE CANCER: + MOTHER BREAST COLON CANCER: DEPRESSION: NEGATIVE ETOH/DRUG ABUSE: NEGATIVE OTHER: NEGATIVE STROKE GRANDMOTHER: + GLAUCOM AT 4 YOA  Review of Systems General:  Denies chills, fatigue,  fever, sweats, weakness, and weight loss. Eyes:  Denies blurring, discharge, and eye pain; lots of floaters....see HPI. ENT:  Denies decreased hearing, ear discharge, and earache; some ytrouble with hearing and listening.. CV:  Denies chest pain or discomfort, fainting, fatigue, palpitations, shortness of breath with exertion, swelling of feet, and swelling of hands. Resp:  Denies cough, shortness of breath, and wheezing. GI:  Complains of indigestion; denies abdominal pain, bloody stools, change in bowel habits, constipation, dark tarry stools, diarrhea, loss of appetite, nausea, vomiting, vomiting blood, and yellowish skin color. GU:  Complains of urinary frequency; denies discharge, dysuria, and nocturia. MS:  Denies joint pain, low back pain, muscle aches, cramps, and stiffness. Derm:  Denies dryness, itching, and rash. Neuro:  Denies numbness, poor balance, tingling, and tremors.  Physical Exam  General:  Well-developed,well-nourished,in no acute distress; alert,appropriate and cooperative throughout examination, 3 pounds heavier than last visit. Head:  Normocephalic and atraumatic without obvious abnormalities. No apparent alopecia or balding but thinning of the hair. Eyes:  Conjunctiva clear bilaterally.  Ears:  External ear exam shows no significant lesions or deformities.  Otoscopic examination reveals clear canals, tympanic membranes are intact bilaterally without bulging, retraction, inflammation or discharge. Hearing is grossly normal bilaterally. Excessive cerumen bilaterally. Nose:  External nasal examination shows no deformity or inflammation. Nasal mucosa are pink and moist without lesions or exudates. Mucosa look good today. Mouth:  Oral mucosa and oropharynx without lesions or exudates.  Teeth in good repair. Neck:  No deformities, masses, or tenderness noted. Chest Wall:  No deformities, masses, tenderness or gynecomastia noted. Breasts:  No masses or gynecomastia  noted Lungs:  Normal respiratory effort, chest expands symmetrically. Lungs are clear to auscultation, no crackles or wheezes. Heart:  Normal rate and regular rhythm. S1 and S2 normal without gallop, murmur, click, rub or other extra sounds. Abdomen:  Bowel sounds positive,abdomen soft and non-tender without masses, organomegaly or hernias noted. Rectal:  No external abnormalities noted. Normal sphincter tone. No rectal masses or tenderness. G neg. Genitalia:  Testes bilaterally descended without nodularity, tenderness or masses. No scrotal masses or lesions. No penis lesions or urethral discharge. Prostate:  Prostate gland firm and smooth, no enlargement, nodularity, tenderness, mass, asymmetry or induration. 10gms. Msk:  No deformity or scoliosis noted of thoracic or lumbar spine.   Pulses:  R and L carotid,radial,femoral,dorsalis pedis and posterior tibial pulses are full and equal bilaterally Extremities:  No clubbing, cyanosis, edema, or deformity noted with normal full range of motion of all joints. Neurologic:  No cranial nerve deficits noted. Station and gait are normal. Sensory, motor and coordinative functions appear intact. Skin:  Intact without suspicious lesions or rashes Cervical Nodes:  No lymphadenopathy noted Inguinal Nodes:  No significant adenopathy Psych:  Cognition and judgment appear intact. Alert and cooperative with normal attention span and concentration. No apparent delusions, illusions, hallucinations   Impression &  Recommendations:  Problem # 1:  HEALTH MAINTENANCE EXAM (ICD-V70.0) Assessment Comment Only  Reviewed preventive care protocols, scheduled due services, and updated immunizations.  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 His mother has signif CREST Syndrome and has a hard time with it. Will do lab screen and see.  Keep digits warm.  Problem # 3:  CAD, NATIVE  VESSEL (ICD-414.01) Assessment: Unchanged Stable. His updated medication list for this problem includes:    Aspirin 81 Mg Tbec (Aspirin) .Marland Kitchen... Take one by mouth daily  Problem # 4:  METABOLIC SYNDROME X (ICD-277.7) Assessment: Unchanged Well controlled. Exercise will help.  Problem # 5:  ALLERGIC RHINITIS, CHRONIC (ICD-477.9) Assessment: Unchanged  Antihistamine works well. The following medications were removed from the medication list:    Ipratropium Bromide 0.03 % Soln (Ipratropium bromide) .Marland Kitchen... 1 spray daily His updated medication list for this problem includes:    Flonase 50 Mcg/act Susp (Fluticasone propionate) .Marland Kitchen... 2 sprays each nostril daily    Loratadine 10 Mg Tabs (Loratadine) .Marland Kitchen... Take one by mouth daily    Ipratropium Bromide 0.06 % Soln (Ipratropium bromide) ..... Use 2 sprays daily  Discussed use of allergy medications and environmental measures.   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 Stable for now.  Problem # 7:  HYPERTENSION (ICD-401.9) Assessment: Unchanged Good control with weight loss. Get back to regular exercise. 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 The following medications were removed from the medication list:    Simvastatin 20 Mg Tabs (Simvastatin) ..... One tab by mouth at night    Trilipix 135 Mg Cpdr (Choline fenofibrate) .Marland Kitchen... Take one by mouth daily His updated medication list for this problem includes:    Crestor 5 Mg Tabs (Rosuvastatin calcium) .Marland Kitchen... Take one by mouth daily  Problem # 9:  GERD (ICD-530.81) Assessment: Unchanged Stable. His updated medication list for this problem includes:    Omeprazole 20 Mg Cpdr (Omeprazole) .Marland Kitchen... 1 capsules daily  Diagnostics Reviewed:  Discussed lifestyle  modifications, diet, antacids/medications, and preventive measures. Handout provided.   Complete Medication List: 1)  Avodart 0.5 Mg Caps (Dutasteride) .Marland Kitchen.. 1by mouth twice a week 2)  Flonase 50 Mcg/act Susp (Fluticasone propionate) .... 2 sprays each nostril daily 3)  Alprazolam 0.5 Mg Tb24 (Alprazolam) .... Take 2  twice a day by mouth 4)  Omeprazole 20 Mg Cpdr (Omeprazole) .Marland Kitchen.. 1 capsules daily 5)  Fish Oil Concentrate 1000 Mg Caps (Omega-3 fatty acids) .... Take 1 capsule by mouth two times a day 6)  Multivitamins Tabs (Multiple vitamin) .Marland Kitchen.. 1 daily by mouth 7)  Aspirin 81 Mg Tbec (Aspirin) .... Take one by mouth daily 8)  Loratadine 10 Mg Tabs (Loratadine) .... Take one by mouth daily 9)  Citracal Plus Tabs (Multiple minerals-vitamins) .... Take one by mouth daily 10)  Multivitamins Tabs (Multiple vitamin) .... Take one by mouth daily 11)  Vitamin D 2000 Unit Tabs (Cholecalciferol) .... Take one by mouth daily 12)  Ipratropium Bromide 0.06 % Soln (Ipratropium bromide) .... Use 2 sprays daily 13)  Crestor 5 Mg Tabs (Rosuvastatin calcium) .... Take one by mouth daily  Patient Instructions: 1)  RTC as needed.  2)  Will discuss Raynaud's labs when returned.   Orders  Added: 1)  Est. Patient 40-64 years [[id number]]    Current Allergies (reviewed today): ! CIPROFLOXACIN HCL (CIPROFLOXACIN HCL) ! Charolette Child

## 2010-08-26 NOTE — Progress Notes (Signed)
Summary: needs order for labwork  Phone Note Call from Patient Call back at [phone]   Caller: Patient Call For: Shaune Leeks MD Summary of Call: Pt has appt for a physical next week and he is asking for an order for lab work to take to [facility].  He will pick this up when ready. Initial call taken by: Lowella Petties CMA, AAMA,  August 04, 2010 9:34 AM  Follow-up for Phone Call        Done. Follow-up by: Shaune Leeks MD,  August 04, 2010 1:49 PM  Additional Follow-up for Phone Call Additional follow up Details #1::        [facility] advising pt that he can pick up order. Additional Follow-up by: Lowella Petties CMA, AAMA,  August 04, 2010 2:45 PM

## 2010-09-23 ENCOUNTER — Ambulatory Visit: Payer: Self-pay | Admitting: Otolaryngology

## 2010-12-10 NOTE — Assessment & Plan Note (Signed)
[facility]                           GASTROENTEROLOGY OFFICE NOTE   NAME:Powell, Clayton HUGHLETT                     MRN:          [id number]  DATE:05/11/2006                            DOB:          07-28-1945    Problem reflux is the reason Clayton Powell has returned for re-evaluation.  After running out of AcipHex he has had recurrent pyrosis, especially at  night.  This has been relieved with Tums.  He has had 2 minor episodes of  chest pain.  On endoscopy in July, he had mild esophagitis and gastritis.  He does have gallbladder stones, determined by ultrasound.   On exam: Pulse:  80.  Blood pressure:  138/86.  Weight:  210.   IMPRESSION:  1. Gastroesophageal reflux disease.  2. Upper abdominal chest pain.  This could be related to acute      cholecystitis versus acid reflux.   RECOMMENDATIONS:  1. Renew AcipHex.  He would like to try omeprazole 20 mg because of its      reduced cost.  2. HIDA scan during episodes of pain.       Barbette Hair. Arlyce Dice, MD,FACG      RDK/MedQ  DD:  05/11/2006  DT:  05/14/2006  Job #:  [id number]

## 2011-02-21 ENCOUNTER — Encounter: Payer: Self-pay | Admitting: Cardiovascular Disease

## 2011-05-04 ENCOUNTER — Ambulatory Visit (INDEPENDENT_AMBULATORY_CARE_PROVIDER_SITE_OTHER): Payer: 59 | Admitting: Family Medicine

## 2011-05-04 ENCOUNTER — Encounter: Payer: Self-pay | Admitting: Family Medicine

## 2011-05-04 DIAGNOSIS — H6120 Impacted cerumen, unspecified ear: Secondary | ICD-10-CM

## 2011-05-04 NOTE — Progress Notes (Signed)
  Subjective:    Patient ID: Clayton Powell, male    DOB: 12-14-1945, 65 y.o.   MRN: [id number]  HPI Pt now of Dr Lianne Bushy, formerly of mine, here as acute pt for ear problems. He was seen at [facility] and given Cortisporin Otic. He went snorkelling in [place] and ears got inflamed and impacted. He denies fever or chills, headache, congestion or ear pain but he does have difficulty speaking on the telephone (which he does most of the day) due to muffled sound from the left ear.    Review of SystemsNoncontributory except as above.       Objective:   Physical Exam  Constitutional: He appears well-developed and well-nourished. No distress.  HENT:  Head: Normocephalic and atraumatic.  Right Ear: External ear normal.  Nose: Nose normal.  Mouth/Throat: Oropharynx is clear and moist.       Left ear occluded and relieved via irrigation.  Eyes: Conjunctivae and EOM are normal. Pupils are equal, round, and reactive to light. Right eye exhibits no discharge. Left eye exhibits no discharge.  Neck: Normal range of motion. Neck supple.  Cardiovascular: Normal rate and regular rhythm.   Pulmonary/Chest: Effort normal and breath sounds normal. He has no wheezes.  Lymphadenopathy:    He has no cervical adenopathy.  Skin: He is not diaphoretic.          Assessment & Plan:

## 2011-05-04 NOTE — Patient Instructions (Signed)
Use ear drops once today and then no more. Let me know if sxs don't resolve as the ear dries out.

## 2011-05-04 NOTE — Assessment & Plan Note (Signed)
Today right clear and left occluded. Cleared almost entirely with bulb syringe via irrigation. Call back if sxs don't resolve for referral to ENT for evacuation.

## 2011-05-05 ENCOUNTER — Telehealth: Payer: Self-pay | Admitting: *Deleted

## 2011-05-05 NOTE — Telephone Encounter (Signed)
Pt was seen yesterday and says he is no better today.  His ear is still stopped up.  He would like to be referred to [facility]NT today.

## 2011-05-05 NOTE — Telephone Encounter (Signed)
Pt called back to report that he did go to ENT this morning and had his ear washed out.  They used a motorized device and it did open his ear.  You had spoken to pt after he left the first message and before I sent the note to you.

## 2011-05-05 NOTE — Telephone Encounter (Signed)
Noted all, thank you

## 2011-05-19 ENCOUNTER — Encounter: Payer: Self-pay | Admitting: Family Medicine

## 2011-08-01 ENCOUNTER — Telehealth: Payer: Self-pay | Admitting: *Deleted

## 2011-08-01 NOTE — Telephone Encounter (Signed)
Patient called requesting a written lab order for his upcoming physical lab work. Patient works at [employer] and gets his lab work done there. Patient would also like an order for a testosterone level added. Please let patient know when order is ready for pickup.

## 2011-08-01 NOTE — Telephone Encounter (Signed)
I put in the order for cmet/lipid on hardcopy Rx.  We need to talk about the testosterone before drawing it.  We can do it at [facility] since it's in the AM and [facility] to [facility] if needed.  PSA is per uro, so I didn't order it.

## 2011-08-01 NOTE — Telephone Encounter (Signed)
LMOVM that lab order is ready for pick up or I can mail it if he requests.  Also explained that the testosterone level and PSA was not included for the reasons listed previously.

## 2011-08-10 ENCOUNTER — Encounter: Payer: Self-pay | Admitting: Family Medicine

## 2011-08-11 ENCOUNTER — Encounter: Payer: Self-pay | Admitting: Family Medicine

## 2011-08-11 ENCOUNTER — Ambulatory Visit (INDEPENDENT_AMBULATORY_CARE_PROVIDER_SITE_OTHER): Payer: 59 | Admitting: Family Medicine

## 2011-08-11 VITALS — BP 142/84 | HR 76 | Temp 98.3°F | Wt 179.0 lb

## 2011-08-11 DIAGNOSIS — M25512 Pain in left shoulder: Secondary | ICD-10-CM

## 2011-08-11 DIAGNOSIS — Z0000 Encounter for general adult medical examination without abnormal findings: Secondary | ICD-10-CM

## 2011-08-11 DIAGNOSIS — R7402 Elevation of levels of lactic acid dehydrogenase (LDH): Secondary | ICD-10-CM

## 2011-08-11 DIAGNOSIS — E8881 Metabolic syndrome: Secondary | ICD-10-CM

## 2011-08-11 DIAGNOSIS — I2510 Atherosclerotic heart disease of native coronary artery without angina pectoris: Secondary | ICD-10-CM

## 2011-08-11 DIAGNOSIS — M25519 Pain in unspecified shoulder: Secondary | ICD-10-CM

## 2011-08-11 DIAGNOSIS — N411 Chronic prostatitis: Secondary | ICD-10-CM

## 2011-08-11 DIAGNOSIS — R7401 Elevation of levels of liver transaminase levels: Secondary | ICD-10-CM

## 2011-08-11 DIAGNOSIS — Z23 Encounter for immunization: Secondary | ICD-10-CM

## 2011-08-11 NOTE — Progress Notes (Signed)
H/o prostatitis and was on avodart, off now, will f/u with Cope.  DRE deferred today.   CAD by calcium scoring.  Lipids followed by clinic in [place].  Exercising w/o CP.  No h/o MI.   L shoulder, nagging injury from years ago.  Back exercising.  Asking about options.  No recent injury.    CPE- See plan.  Routine anticipatory guidance given to patient.  See health maintenance.  PMH and SH reviewed  Meds, vitals, and allergies reviewed.   ROS: See HPI.  Otherwise negative.    GEN: nad, alert and oriented HEENT: mucous membranes moist NECK: supple w/o LA CV: rrr. PULM: ctab, no inc wob ABD: soft, +bs EXT: no edema SKIN: no acute rash L shoulder with normal inspection, pain on int/ext rotation, + impingement, no arm drop.    Labs d/w pt.

## 2011-08-11 NOTE — Patient Instructions (Signed)
Keep exercising and let me know if you have concerns.  See Shirlee Limerick about your referral before you leave today. Take care.

## 2011-08-12 ENCOUNTER — Encounter: Payer: Self-pay | Admitting: Family Medicine

## 2011-08-12 DIAGNOSIS — M75102 Unspecified rotator cuff tear or rupture of left shoulder, not specified as traumatic: Secondary | ICD-10-CM | POA: Insufficient documentation

## 2011-08-12 DIAGNOSIS — R7401 Elevation of levels of liver transaminase levels: Secondary | ICD-10-CM | POA: Insufficient documentation

## 2011-08-12 DIAGNOSIS — Z0000 Encounter for general adult medical examination without abnormal findings: Secondary | ICD-10-CM | POA: Insufficient documentation

## 2011-08-12 HISTORY — DX: Unspecified rotator cuff tear or rupture of left shoulder, not specified as traumatic: M75.102

## 2011-08-12 NOTE — Assessment & Plan Note (Signed)
Per Dr. Cope.  

## 2011-08-12 NOTE — Assessment & Plan Note (Signed)
Prostate exam per Dr. Achilles Dunk, colon up to date, vaccines up to date. Healthy diet, exercising.

## 2011-08-12 NOTE — Assessment & Plan Note (Signed)
Lipids followed by clinic in [facility]

## 2011-08-12 NOTE — Assessment & Plan Note (Signed)
No CP, good exercise tolerance, lipids followed by clinic in [place].

## 2011-08-12 NOTE — Assessment & Plan Note (Signed)
Refer to ortho.

## 2011-08-12 NOTE — Assessment & Plan Note (Signed)
Recheck LFT in ~1 month. D/w pt.  He agrees.

## 2011-08-29 ENCOUNTER — Encounter: Payer: Self-pay | Admitting: Family Medicine

## 2011-10-11 ENCOUNTER — Encounter: Payer: Self-pay | Admitting: Family Medicine

## 2011-10-12 ENCOUNTER — Encounter: Payer: Self-pay | Admitting: *Deleted

## 2011-10-20 ENCOUNTER — Ambulatory Visit (INDEPENDENT_AMBULATORY_CARE_PROVIDER_SITE_OTHER): Payer: 59 | Admitting: Family Medicine

## 2011-10-20 ENCOUNTER — Encounter: Payer: Self-pay | Admitting: Family Medicine

## 2011-10-20 VITALS — BP 152/86 | HR 65 | Temp 97.9°F | Wt 187.0 lb

## 2011-10-20 DIAGNOSIS — R011 Cardiac murmur, unspecified: Secondary | ICD-10-CM

## 2011-10-20 NOTE — Progress Notes (Signed)
He went to the allergist and had PFTs done.  No sig prev and post change on testing.  Murmur noted per patient.  No prev known h/o murmur.   No CP.  Still working out.  No BLE edema.  Sleeping on 1-2 pillows, no change.     Normal stress test in 2010.   Meds, vitals, and allergies reviewed.   ROS: See HPI.  Otherwise, noncontributory.  GEN: nad, alert and oriented NECK: supple w/o LA CV: rrr.  no murmur heart upright or supine PULM: ctab, no inc wob ABD: soft, +bs EXT: no edema

## 2011-10-20 NOTE — Patient Instructions (Signed)
See [place] about your referral before you leave today. Take care.  

## 2011-10-21 ENCOUNTER — Encounter: Payer: Self-pay | Admitting: Family Medicine

## 2011-10-21 DIAGNOSIS — R011 Cardiac murmur, unspecified: Secondary | ICD-10-CM | POA: Insufficient documentation

## 2011-10-21 NOTE — Assessment & Plan Note (Signed)
Heard previously per patient, not heard today. Check echo to address this and f/u prn.  Reassuring exam and history.

## 2011-11-08 ENCOUNTER — Other Ambulatory Visit (INDEPENDENT_AMBULATORY_CARE_PROVIDER_SITE_OTHER): Payer: 59

## 2011-11-08 ENCOUNTER — Other Ambulatory Visit: Payer: Self-pay

## 2011-11-08 DIAGNOSIS — I369 Nonrheumatic tricuspid valve disorder, unspecified: Secondary | ICD-10-CM

## 2011-11-08 DIAGNOSIS — R011 Cardiac murmur, unspecified: Secondary | ICD-10-CM

## 2011-12-24 HISTORY — PX: SHOULDER ARTHROSCOPY W/ ROTATOR CUFF REPAIR: SHX2400

## 2012-01-02 ENCOUNTER — Encounter: Payer: Self-pay | Admitting: Family Medicine

## 2012-01-10 ENCOUNTER — Encounter (HOSPITAL_BASED_OUTPATIENT_CLINIC_OR_DEPARTMENT_OTHER): Payer: Self-pay | Admitting: *Deleted

## 2012-01-10 NOTE — Progress Notes (Signed)
Pt not using any inhalers- He lost 3o lb and is off statins-diet controlled-exercises regularly Normal stress test 1/12

## 2012-01-13 ENCOUNTER — Encounter (HOSPITAL_BASED_OUTPATIENT_CLINIC_OR_DEPARTMENT_OTHER): Payer: Self-pay | Admitting: *Deleted

## 2012-01-13 ENCOUNTER — Ambulatory Visit (HOSPITAL_BASED_OUTPATIENT_CLINIC_OR_DEPARTMENT_OTHER): Payer: 59 | Admitting: Anesthesiology

## 2012-01-13 ENCOUNTER — Encounter (HOSPITAL_BASED_OUTPATIENT_CLINIC_OR_DEPARTMENT_OTHER)
Admission: RE | Disposition: A | Discharge status: Home / Self Care | Payer: Self-pay | Source: Ambulatory Visit | Attending: Orthopedic Surgery

## 2012-01-13 ENCOUNTER — Ambulatory Visit (HOSPITAL_BASED_OUTPATIENT_CLINIC_OR_DEPARTMENT_OTHER)
Admission: RE | Admit: 2012-01-13 | Discharge: 2012-01-13 | Disposition: A | Discharge status: Home / Self Care | Payer: 59 | Source: Ambulatory Visit | Attending: Orthopedic Surgery | Admitting: Orthopedic Surgery

## 2012-01-13 ENCOUNTER — Encounter (HOSPITAL_BASED_OUTPATIENT_CLINIC_OR_DEPARTMENT_OTHER): Payer: Self-pay | Admitting: Anesthesiology

## 2012-01-13 DIAGNOSIS — M75120 Complete rotator cuff tear or rupture of unspecified shoulder, not specified as traumatic: Secondary | ICD-10-CM | POA: Insufficient documentation

## 2012-01-13 DIAGNOSIS — K219 Gastro-esophageal reflux disease without esophagitis: Secondary | ICD-10-CM | POA: Insufficient documentation

## 2012-01-13 DIAGNOSIS — E785 Hyperlipidemia, unspecified: Secondary | ICD-10-CM | POA: Insufficient documentation

## 2012-01-13 DIAGNOSIS — I10 Essential (primary) hypertension: Secondary | ICD-10-CM | POA: Insufficient documentation

## 2012-01-13 DIAGNOSIS — N400 Enlarged prostate without lower urinary tract symptoms: Secondary | ICD-10-CM | POA: Insufficient documentation

## 2012-01-13 DIAGNOSIS — M75102 Unspecified rotator cuff tear or rupture of left shoulder, not specified as traumatic: Secondary | ICD-10-CM

## 2012-01-13 DIAGNOSIS — M25819 Other specified joint disorders, unspecified shoulder: Secondary | ICD-10-CM | POA: Insufficient documentation

## 2012-01-13 DIAGNOSIS — M7520 Bicipital tendinitis, unspecified shoulder: Secondary | ICD-10-CM | POA: Insufficient documentation

## 2012-01-13 HISTORY — DX: Unspecified rotator cuff tear or rupture of left shoulder, not specified as traumatic: M75.102

## 2012-01-13 LAB — POCT HEMOGLOBIN-HEMACUE: Hemoglobin: 15.6 g/dL (ref 13.0–17.0)

## 2012-01-13 SURGERY — SHOULDER ARTHROSCOPY WITH BICEPS TENDON REPAIR
Anesthesia: General | Site: Shoulder | Laterality: Left | Wound class: Clean

## 2012-01-13 MED ORDER — PROPOFOL 10 MG/ML IV EMUL
INTRAVENOUS | Status: DC | PRN
  Administered 2012-01-13: 150 mg via INTRAVENOUS

## 2012-01-13 MED ORDER — METHOCARBAMOL 500 MG PO TABS: 500.0000 mg | ORAL_TABLET | Freq: Four times a day (QID) | ORAL | Status: AC

## 2012-01-13 MED ORDER — CEFAZOLIN SODIUM-DEXTROSE 2-3 GM-% IV SOLR
2.0000 g | INTRAVENOUS | Status: AC
  Administered 2012-01-13: 2 g via INTRAVENOUS

## 2012-01-13 MED ORDER — MIDAZOLAM HCL 2 MG/2ML IJ SOLN
0.5000 mg | INTRAMUSCULAR | Status: DC | PRN
  Administered 2012-01-13: 2 mg via INTRAVENOUS

## 2012-01-13 MED ORDER — DEXAMETHASONE SODIUM PHOSPHATE 4 MG/ML IJ SOLN
INTRAMUSCULAR | Status: DC | PRN
  Administered 2012-01-13: 10 mg via INTRAVENOUS

## 2012-01-13 MED ORDER — LACTATED RINGERS IV SOLN
INTRAVENOUS | Status: DC
  Administered 2012-01-13: 09:00:00 via INTRAVENOUS

## 2012-01-13 MED ORDER — HYDROMORPHONE HCL PF 1 MG/ML IJ SOLN: 0.2500 mg | INTRAMUSCULAR | Status: DC | PRN

## 2012-01-13 MED ORDER — BUPIVACAINE-EPINEPHRINE PF 0.5-1:200000 % IJ SOLN
INTRAMUSCULAR | Status: DC | PRN
  Administered 2012-01-13: 30 mL

## 2012-01-13 MED ORDER — OXYCODONE-ACETAMINOPHEN 10-325 MG PO TABS: 1.0000 | ORAL_TABLET | Freq: Four times a day (QID) | ORAL | Status: AC | PRN

## 2012-01-13 MED ORDER — SODIUM CHLORIDE 0.9 % IR SOLN
Status: DC | PRN
  Administered 2012-01-13: 4

## 2012-01-13 MED ORDER — LIDOCAINE HCL (CARDIAC) 20 MG/ML IV SOLN
INTRAVENOUS | Status: DC | PRN
  Administered 2012-01-13: 50 mg via INTRAVENOUS

## 2012-01-13 MED ORDER — SUCCINYLCHOLINE CHLORIDE 20 MG/ML IJ SOLN
INTRAMUSCULAR | Status: DC | PRN
  Administered 2012-01-13: 100 mg via INTRAVENOUS

## 2012-01-13 MED ORDER — PROMETHAZINE HCL 25 MG PO TABS: 25.0000 mg | ORAL_TABLET | Freq: Four times a day (QID) | ORAL | Status: DC | PRN

## 2012-01-13 MED ORDER — ONDANSETRON HCL 4 MG/2ML IJ SOLN
INTRAMUSCULAR | Status: DC | PRN
  Administered 2012-01-13: 4 mg via INTRAVENOUS

## 2012-01-13 MED ORDER — FENTANYL CITRATE 0.05 MG/ML IJ SOLN
50.0000 ug | INTRAMUSCULAR | Status: DC | PRN
  Administered 2012-01-13: 100 ug via INTRAVENOUS

## 2012-01-13 SURGICAL SUPPLY — 67 items
ANCHOR SUT BIO SW 4.75X19.1 (Anchor) ×2 IMPLANT
BLADE 4.2CUDA (BLADE) IMPLANT
BLADE CUDA GRT WHITE 3.5 (BLADE) IMPLANT
BLADE CUTTER GATOR 3.5 (BLADE) ×2 IMPLANT
BLADE GREAT WHITE 4.2 (BLADE) IMPLANT
BLADE SURG 15 STRL LF DISP TIS (BLADE) IMPLANT
BLADE SURG 15 STRL SS (BLADE)
BUR OVAL 4.0 (BURR) IMPLANT
BUR OVAL 6.0 (BURR) ×2 IMPLANT
CANISTER OMNI JUG 16 LITER (MISCELLANEOUS) ×2 IMPLANT
CANNULA 5.75X71 LONG (CANNULA) IMPLANT
CANNULA TWIST IN 8.25X7CM (CANNULA) ×2 IMPLANT
CANNULA TWIST IN 8.25X9CM (CANNULA) ×2 IMPLANT
CLOTH BEACON ORANGE TIMEOUT ST (SAFETY) ×2 IMPLANT
CUTTER MENISCUS  4.2MM (BLADE)
CUTTER MENISCUS 4.2MM (BLADE) IMPLANT
DECANTER SPIKE VIAL GLASS SM (MISCELLANEOUS) IMPLANT
DRAPE INCISE IOBAN 66X45 STRL (DRAPES) ×2 IMPLANT
DRAPE SHOULDER BEACH CHAIR (DRAPES) ×2 IMPLANT
DRAPE U 20/CS (DRAPES) ×2 IMPLANT
DRAPE U-SHAPE 47X51 STRL (DRAPES) ×2 IMPLANT
DRSG PAD ABDOMINAL 8X10 ST (GAUZE/BANDAGES/DRESSINGS) ×2 IMPLANT
DURAPREP 26ML APPLICATOR (WOUND CARE) ×2 IMPLANT
ELECT REM PT RETURN 9FT ADLT (ELECTROSURGICAL) ×2
ELECTRODE REM PT RTRN 9FT ADLT (ELECTROSURGICAL) ×1 IMPLANT
FIBERSTICK 2 (SUTURE) IMPLANT
GLOVE BIO SURGEON STRL SZ8 (GLOVE) ×4 IMPLANT
GLOVE BIOGEL M STRL SZ7.5 (GLOVE) ×2 IMPLANT
GLOVE ORTHO TXT STRL SZ7.5 (GLOVE) ×2 IMPLANT
GOWN PREVENTION PLUS XLARGE (GOWN DISPOSABLE) ×2 IMPLANT
GOWN PREVENTION PLUS XXLARGE (GOWN DISPOSABLE) ×4 IMPLANT
IMMOBILIZER SHOULDER XLGE (ORTHOPEDIC SUPPLIES) IMPLANT
KIT SHOULDER TRACTION (DRAPES) ×2 IMPLANT
LASSO SUT 90 DEGREE (SUTURE) IMPLANT
NDL SUT 6 .5 CRC .975X.05 MAYO (NEEDLE) IMPLANT
NEEDLE MAYO TAPER (NEEDLE)
NEEDLE SCORPION MULTI FIRE (NEEDLE) ×2 IMPLANT
PACK ARTHROSCOPY DSU (CUSTOM PROCEDURE TRAY) ×2 IMPLANT
PACK BASIN DAY SURGERY FS (CUSTOM PROCEDURE TRAY) ×2 IMPLANT
SET ARTHROSCOPY TUBING (MISCELLANEOUS) ×1
SET ARTHROSCOPY TUBING LN (MISCELLANEOUS) ×1 IMPLANT
SHEET MEDIUM DRAPE 40X70 STRL (DRAPES) ×2 IMPLANT
SLEEVE SCD COMPRESS KNEE MED (MISCELLANEOUS) ×2 IMPLANT
SLING ARM FOAM STRAP LRG (SOFTGOODS) ×2 IMPLANT
SLING ARM FOAM STRAP MED (SOFTGOODS) IMPLANT
SLING ARM FOAM STRAP XLG (SOFTGOODS) IMPLANT
SLING ARM IMMOBILIZER LRG (SOFTGOODS) IMPLANT
SLING ARM IMMOBILIZER MED (SOFTGOODS) IMPLANT
SPONGE GAUZE 4X4 12PLY (GAUZE/BANDAGES/DRESSINGS) ×2 IMPLANT
STRIP CLOSURE SKIN 1/2X4 (GAUZE/BANDAGES/DRESSINGS) ×2 IMPLANT
SUT FIBERWIRE #2 38 T-5 BLUE (SUTURE)
SUT LASSO 45 DEGREE (SUTURE) IMPLANT
SUT LASSO 45 DEGREE LEFT (SUTURE) IMPLANT
SUT LASSO 45D RIGHT (SUTURE) IMPLANT
SUT MNCRL AB 4-0 PS2 18 (SUTURE) IMPLANT
SUT PDS AB 1 CT  36 (SUTURE)
SUT PDS AB 1 CT 36 (SUTURE) IMPLANT
SUT PROLENE 0 CT 1 CR/8 (SUTURE) IMPLANT
SUT TIGER TAPE 7 IN WHITE (SUTURE) IMPLANT
SUT VIC AB 3-0 SH 27 (SUTURE)
SUT VIC AB 3-0 SH 27X BRD (SUTURE) IMPLANT
SUTURE FIBERWR #2 38 T-5 BLUE (SUTURE) IMPLANT
TOWEL OR 17X24 6PK STRL BLUE (TOWEL DISPOSABLE) ×2 IMPLANT
TOWEL OR NON WOVEN STRL DISP B (DISPOSABLE) ×2 IMPLANT
TUBE CONNECTING 20X1/4 (TUBING) IMPLANT
WAND STAR VAC 90 (SURGICAL WAND) ×2 IMPLANT
WATER STERILE IRR 1000ML POUR (IV SOLUTION) ×2 IMPLANT

## 2012-01-13 NOTE — Op Note (Signed)
01/13/2012  1:05 PM  PATIENT:  Clayton Powell    PRE-OPERATIVE DIAGNOSIS:  left shoulder rotator cuff rupture, impingement syndrome, and biceps tendon dislocation with subscapularis rupture  POST-OPERATIVE DIAGNOSIS: Left shoulder full-thickness supraspinatus tear, impingement syndrome, 20% biceps tendon fraying, no evidence for subscapularis tear or biceps tendon rupture  PROCEDURE:  SHOULDER ARTHROSCOPY WITH ROTATOR CUFF REPAIR OF THE SUPRASPINATUS, ACROMIOPLASTY, AND DEBRIDEMENT OF THE LABRUM AND BICEPS TENDON  SURGEON:  Eulas Post, MD  PHYSICIAN ASSISTANT: Janace Litten, OPA-C, present and scrubbed throughout the case, critical for completion in a timely fashion, and for retraction, instrumentation, and closure.  ANESTHESIA:   General  PREOPERATIVE INDICATIONS:  Clayton Powell is a  66 y.o. male with a diagnosis of left shoulder rotator cuff rupture, bicepital tenosynovitis who failed conservative measures and elected for surgical management.  He is an avid weight lifter, and described anterior pain.  The risks benefits and alternatives were discussed with the patient preoperatively including but not limited to the risks of infection, bleeding, nerve injury, cardiopulmonary complications, the need for revision surgery, among others, and the patient was willing to proceed. We also discussed the risks of recurrent rotator cuff rupture, Popeye deformity, incomplete relief of pain, the need for revision surgery, among others.  OPERATIVE IMPLANTS: Arthrex 4.75 mm bio composite swivel lock anchor x1 with inverted fiber tape for the supraspinatus.  OPERATIVE FINDINGS: The glenohumeral articular cartilage had some degenerative changes, which were grade 2 at the worst. The superior labrum had some fraying, but was overall intact. There was also fraying of the anterior labrum. The anterior inferior glenohumeral ligament was intact. The biceps tendon had some fraying, approximately 20%,  however this was not completely torn, and given the fact that it was less than 50% torn, as well as his weightlifting hobby, I did not perform a biceps tenodesis. The subscapularis and the biceps pulley was completely normal. The middle glenohumeral ligament was in normal position as well. The supraspinatus however had fraying, and tearing that was partial thickness underneath a hypertrophic junction of the a.c joint, and there was a full-thickness tear at the anterior portion that measured about 1 cm immediately posterior to the bicipital groove. The tissue quality was mediocre anteriorly, but better posteriorly. He had substantial subacromial spurring.  OPERATIVE PROCEDURE: The patient was brought to the operating room and placed in the supine position. General anesthesia was administered. IV antibiotics were given. The left upper extremity was examined, and had basically full motion. The left upper extremity was then prepped and draped in usual sterile fashion. Time out was performed. Diagnostic arthroscopy was carried out with the above-named findings. I used the arthroscopic shaver to debride the biceps tendon, as well as the frayed portions of the labrum.  I then went to the subacromial space. Complete bursectomy was performed, and I released the CA ligament and resected the subacromial spur. There was a fairly substantial sized spur that matched the full-thickness rotator cuff tear. I also debrided the upper leaflet of the rotator cuff, which had a balled up portion of the tendon, medially. He did not have any pain over his a.c. 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 Additionally, I did not expose the joint, but did resect the spurs on the acromial side overlying the cuff.  I then used a bur to perform a light tubercle plasty, to expose good bleeding cancellus bone for healing purposes, and then  placed an inverted  fiber tape. This was then anchored down to the anterior tuberosity using a swivel lock anchor. I placed the anchor basically immediately adjacent to the bicipital groove, and I was able to visualize the biceps at this time.  Excellent apposition of the bone was achieved, and I irrigated the wounds copiously, and repaired the portals with Monocryl with Steri-Strips and sterile gauze. He tolerated the procedure well with no complications.

## 2012-01-13 NOTE — Anesthesia Procedure Notes (Addendum)
Anesthesia Regional Block:  Interscalene brachial plexus block  Pre-Anesthetic Checklist: ,, timeout performed, Correct Patient, Correct Site, Correct Laterality, Correct Procedure, Correct Position, site marked, Risks and benefits discussed, pre-op evaluation,  At surgeon's request and post-op pain management  Laterality: Left  Prep: Maximum Sterile Barrier Precautions used and chloraprep       Needles:  Injection technique: Single-shot  Needle Type: Echogenic Stimulator Needle      Needle Gauge: 22 and 22 G    Additional Needles:  Procedures: ultrasound guided and nerve stimulator Interscalene brachial plexus block  Nerve Stimulator or Paresthesia:  Response: Biceps response, 0.4 mA,   Additional Responses:   Narrative:  Start time: 01/13/2012 9:18 AM End time: 01/13/2012 9:29 AM Injection made incrementally with aspirations every 5 mL. Anesthesiologist: Sampson Goon, MD  Additional Notes: 2% Lidocaine skin wheel.   Interscalene brachial plexus block Procedure Name: Intubation Performed by: York Grice Pre-anesthesia Checklist: Patient identified, Timeout performed, Emergency Drugs available, Suction available and Patient being monitored Patient Re-evaluated:Patient Re-evaluated prior to inductionOxygen Delivery Method: Circle system utilized Preoxygenation: Pre-oxygenation with 100% oxygen Intubation Type: IV induction Ventilation: Mask ventilation without difficulty Laryngoscope Size: Miller and 2 Grade View: Grade I Tube type: Oral Tube size: 8.0 mm Number of attempts: 1 Airway Equipment and Method: Stylet Placement Confirmation: breath sounds checked- equal and bilateral and positive ETCO2 Secured at: 22 cm Tube secured with: Tape Dental Injury: Teeth and Oropharynx as per pre-operative assessment

## 2012-01-13 NOTE — Anesthesia Preprocedure Evaluation (Signed)
Anesthesia Evaluation  Patient identified by MRN, date of birth, ID band Patient awake    Reviewed: Allergy & Precautions, H&P , NPO status , Patient's Chart, lab work & pertinent test results  Airway Mallampati: II TM Distance: >3 FB Neck ROM: Full    Dental No notable dental hx. (+) Teeth Intact and Dental Advisory Given   Pulmonary neg pulmonary ROS,  breath sounds clear to auscultation  Pulmonary exam normal       Cardiovascular hypertension, Rhythm:Regular Rate:Normal     Neuro/Psych negative neurological ROS  negative psych ROS   GI/Hepatic Neg liver ROS, GERD-  Medicated and Controlled,  Endo/Other  negative endocrine ROS  Renal/GU negative Renal ROS  negative genitourinary   Musculoskeletal   Abdominal   Peds  Hematology negative hematology ROS (+)   Anesthesia Other Findings   Reproductive/Obstetrics negative OB ROS                           Anesthesia Physical Anesthesia Plan  ASA: II  Anesthesia Plan: General   Post-op Pain Management:    Induction: Intravenous  Airway Management Planned: Oral ETT  Additional Equipment:   Intra-op Plan:   Post-operative Plan: Extubation in OR  Informed Consent: I have reviewed the patients History and Physical, chart, labs and discussed the procedure including the risks, benefits and alternatives for the proposed anesthesia with the patient or authorized representative who has indicated his/her understanding and acceptance.   Dental advisory given  Plan Discussed with: CRNA  Anesthesia Plan Comments:         Anesthesia Quick Evaluation

## 2012-01-13 NOTE — Transfer of Care (Signed)
Immediate Anesthesia Transfer of Care Note  Patient: Clayton Powell  Procedure(s) Performed: Procedure(s) (LRB): SHOULDER ARTHROSCOPY WITH BICEPS TENDON REPAIR (Left)  Patient Location: PACU  Anesthesia Type: General  Level of Consciousness: awake, alert  and oriented  Airway & Oxygen Therapy: Patient Spontanous Breathing and Patient connected to face mask oxygen  Post-op Assessment: Report given to PACU RN and Post -op Vital signs reviewed and stable  Post vital signs: Reviewed and stable  Complications: No apparent anesthesia complications

## 2012-01-13 NOTE — Anesthesia Postprocedure Evaluation (Signed)
  Anesthesia Post-op Note  Patient: Clayton Powell  Procedure(s) Performed: Procedure(s) (LRB): SHOULDER ARTHROSCOPY WITH BICEPS TENDON REPAIR (Left)  Patient Location: PACU  Anesthesia Type: GA combined with regional for post-op pain  Level of Consciousness: awake  Airway and Oxygen Therapy: Patient Spontanous Breathing and Patient connected to face mask oxygen  Post-op Pain: none  Post-op Assessment: Post-op Vital signs reviewed, Patient's Cardiovascular Status Stable, Respiratory Function Stable, Patent Airway and No signs of Nausea or vomiting  Post-op Vital Signs: Reviewed and stable  Complications: No apparent anesthesia complications

## 2012-01-13 NOTE — Progress Notes (Signed)
Assisted Dr. Fitzgerald with left, ultrasound guided, interscalene  block. Side rails up, monitors on throughout procedure. See vital signs in flow sheet. Tolerated Procedure well. 

## 2012-01-13 NOTE — Discharge Instructions (Signed)
Rotator Cuff Injury The rotator cuff is the collective set of muscles and tendons that make up the stabilizing unit of your shoulder. This unit holds in the ball of the humerus (upper arm bone) in the socket of the scapula (shoulder blade). Injuries to this stabilizing unit most commonly come from sports or activities that cause the arm to be moved repeatedly over the head. Examples of this include throwing, weight lifting, swimming, racquet sports, or an injury such as falling on your arm. Chronic (longstanding) irritation of this unit can cause inflammation (soreness), bursitis, and eventual damage to the tendons to the point of rupture (tear). An acute (sudden) injury of the rotator cuff can result in a partial or complete tear. You may need surgery with complete tears. Small or partial rotator cuff tears may be treated conservatively with temporary immobilization, exercises and rest. Physical therapy may be needed. HOME CARE INSTRUCTIONS   Apply ice to the injury for 15 to 20 minutes 3 to 4 times per day for the first 2 days. Put the ice in a plastic bag and place a towel between the bag of ice and your skin.   If you have a shoulder immobilizer (sling and straps), do not remove it for as long as directed by your caregiver or until you see a caregiver for a follow-up examination. If you need to remove it, move your arm as little as possible.   You may want to sleep on several pillows or in a recliner at night to lessen swelling and pain.   Only take over-the-counter or prescription medicines for pain, discomfort, or fever as directed by your caregiver.   Do simple hand squeezing exercises with a soft rubber ball to decrease hand swelling.  SEEK MEDICAL CARE IF:   Pain in your shoulder increases or new pain or numbness develops in your arm, hand, or fingers.   Your hand or fingers are colder than your other hand.  SEEK IMMEDIATE MEDICAL CARE IF:   Your arm, hand, or fingers are numb or  tingling.   Your arm, hand, or fingers are increasingly swollen and painful, or turn white or blue.  Document Released: 07/08/2000 Document Revised: 06/30/2011 Document Reviewed: 07/01/2008 ExitCare Patient Information 2012 [place], [facility]   Discharge Instructions After Orthopedic Procedures:  *You may feel tired and weak following your procedure. It is recommended that you limit physical activity for the next 24 hours and rest at home for the remainder of today and tomorrow. *No strenuous activity should be started without your doctor's permission.  Elevate the extremity that you had surgery on to a level above your heart. This should continue for 48 hours or as instructed by your doctor.  If you had hand, arm or shoulder surgery you should move your fingers frequently unless otherwise instructed by your doctor.  If you had foot, knee or leg surgery you should wiggle your toes frequently unless otherwise instructed by your doctor.  Follow your doctor's exact instructions for activity at home. Use your home equipment as instructed. (Crutches, hard shoes, slings etc.)  Limit your activity as instructed by your doctor.  Report to your doctor should any of the following occur: 1. Extreme swelling of your fingers or toes. 2. Inability to wiggle your fingers or toes. 3. Coldness, pale or bluish color in your fingers or toes. 4. Loss of sensation, numbness or tingling of your fingers or toes. 5. Unusual smell or odor from under your dressing or cast. 6. Excessive bleeding or  drainage from the surgical site. 7. Pain not relieved by medication your doctor has prescribed for you. 8. Cast or dressing too tight (do not get your dressing or cast wet or put anything under          your dressing or cast.)  *Do not change your dressing unless instructed by your doctor or discharge nurse. Then follow exact instructions.  *Follow labeled instructions for any medications that your doctor may have  prescribed for you. *Should any questions or complications develop following your procedure, PLEASE CONTACT YOUR DOCTOR.       Regional Anesthesia Blocks  1. Numbness or the inability to move the "blocked" extremity may last from 3-48 hours after placement. The length of time depends on the medication injected and your individual response to the medication. If the numbness is not going away after 48 hours, call your surgeon.  2. The extremity that is blocked will need to be protected until the numbness is gone and the  Strength has returned. Because you cannot feel it, you will need to take extra care to avoid injury. Because it may be weak, you may have difficulty moving it or using it. You may not know what position it is in without looking at it while the block is in effect.  3. For blocks in the legs and feet, returning to weight bearing and walking needs to be done carefully. You will need to wait until the numbness is entirely gone and the strength has returned. You should be able to move your leg and foot normally before you try and bear weight or walk. You will need someone to be with you when you first try to ensure you do not fall and possibly risk injury.  4. Bruising and tenderness at the needle site are common side effects and will resolve in a few days.  5. Persistent numbness or new problems with movement should be communicated to the surgeon or the [facility] [phone] [facility] [phone]).    Post Anesthesia Home Care Instructions  Activity: Get plenty of rest for the remainder of the day. A responsible adult should stay with you for 24 hours following the procedure.  For the next 24 hours, DO NOT: -Drive a car -Advertising copywriter -Drink alcoholic beverages -Take any medication unless instructed by your physician -Make any legal decisions or sign important papers.  Meals: Start with liquid foods such as gelatin or soup. Progress to  regular foods as tolerated. Avoid greasy, spicy, heavy foods. If nausea and/or vomiting occur, drink only clear liquids until the nausea and/or vomiting subsides. Call your physician if vomiting continues.  Special Instructions/Symptoms: Your throat may feel dry or sore from the anesthesia or the breathing tube placed in your throat during surgery. If this causes discomfort, gargle with warm salt water. The discomfort should disappear within 24 hours.

## 2012-01-13 NOTE — H&P (Signed)
PREOPERATIVE H&P  Chief Complaint: left shoulder rc rupture, bicepital tenosynovitis  HPI: Clayton Powell is a 66 y.o. male who presents for preoperative history and physical with a diagnosis of left shoulder rc rupture, bicepital tenosynovitis. He has failed conservative measures, and had a tear of his supraspinatus as well as the subscapularis with biceps dislocation. He is an avid weight lifter. Symptoms are rated as moderate to severe, and have been worsening.  This is significantly impairing activities of daily living.  He has elected for surgical management.   Past Medical History  Diagnosis Date  . GERD (gastroesophageal reflux disease) 12/05  . HLD (hyperlipidemia) 3/99  . BPH (benign prostatic hypertrophy) 6/07  . CAD (coronary artery disease) 2010    dx made by coronary calcium scoring, Normal stress test in 2010.   Marland Kitchen HTN (hypertension) 2002    no meds   Past Surgical History  Procedure Date  . Colonoscopy 09/24/04    nml; Dr. Sharen Hint   . Spect ett nml 12/16/05  . Egd esophagitis by bx 01/26/06    gastritis bx neg h-pylori  . Admitted to [facility] chest pain 12/15/05    Dr. Juliann Pares  . Right and left prostate needle bx 05/02/03    acute inflammation; Dr. Patsi Sears   . Blepharoplasty 10/11    Dr. Kemper Durie    History   Social History  . Marital Status: Married    Spouse Name: N/A    Number of Children: N/A  . Years of Education: N/A   Social History Main Topics  . Smoking status: Never Smoker   . Smokeless tobacco: Never Used  . Alcohol Use: Yes     beer, occ  . Drug Use: No  . Sexually Active: None   Other Topics Concern  . 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 Plays guitarWorks out regularly.   Family History  Problem Relation Age of Onset  . Glaucoma      grandmother at 27  . Prostate cancer Neg Hx   . Colon cancer Neg Hx    Allergies  Allergen Reactions  .  Ciprofloxacin Hcl     REACTION: rash  . Quinolones    Prior to Admission medications   Medication Sig Start Date End Date Taking? Authorizing Provider  ALPRAZolam (XANAX XR) 0.5 MG 24 hr tablet Take 0.5 mg by mouth 2 (two) times daily.    Yes Historical Provider, MD  aspirin 81 MG EC tablet Take 81 mg by mouth daily.     Yes Historical Provider, MD  Cholecalciferol (VITAMIN D) 2000 UNITS CAPS Take 1 capsule by mouth daily.     Yes Historical Provider, MD  fluticasone (FLONASE) 50 MCG/ACT nasal spray Place 2 sprays into the nose daily.     Yes Historical Provider, MD  Methylsulfonylmethane (MSM) 1000 MG CAPS Take by mouth.   Yes Historical Provider, MD  Multiple Minerals-Vitamins (CITRACAL PLUS) TABS Take 1 tablet by mouth daily.     Yes Historical Provider, MD  Multiple Vitamin (MULTIVITAMIN) tablet Take 1 tablet by mouth daily.     Yes Historical Provider, MD  NON FORMULARY Creatine Ethyl Ester 2250 mg Take one half tablet per day   Yes Historical Provider, MD  Omega-3 Fatty Acids (FISH OIL CONCENTRATE) 1000 MG CAPS Take 1 capsule by mouth 2 (two) times daily.     Yes Historical Provider, MD  omeprazole (PRILOSEC) 20  MG capsule Take 20 mg by mouth daily.   Yes Historical Provider, MD  albuterol (PROVENTIL HFA;VENTOLIN HFA) 108 (90 BASE) MCG/ACT inhaler Inhale 2 puffs into the lungs every 6 (six) hours as needed.    Historical Provider, MD  ipratropium (ATROVENT) 0.06 % nasal spray Place 2 sprays into the nose daily.      Historical Provider, MD  meloxicam (MOBIC) 15 MG tablet Take 15 mg by mouth daily.    Historical Provider, MD  mometasone [facility]) 220 MCG/INH inhaler Inhale 2 puffs into the lungs daily.    Historical Provider, MD  tadalafil (CIALIS) 5 MG tablet Take 5 mg by mouth daily as needed.    Historical Provider, MD     Positive ROS: All other systems have been reviewed and were otherwise negative with the exception of those mentioned in the HPI and as above.  Physical  Exam: General: Alert, no acute distress Cardiovascular: No pedal edema Respiratory: No cyanosis, no use of accessory musculature GI: No organomegaly, abdomen is soft and non-tender Skin: No lesions in the area of chief complaint Neurologic: Sensation intact distally Psychiatric: Patient is competent for consent with normal mood and affect Lymphatic: No axillary or cervical lymphadenopathy  MUSCULOSKELETAL: Left shoulder has pain anteriorly, with weakness with supraspinatus and subscapularis testing, although he is able to accommodate remarkably well. Nonetheless he does have pain over the biceps tendon.  Assessment: left shoulder subscapularis and supraspinatus tear with biceps tendon dislocation and impingement syndrome.  Plan: Plan for Procedure(s): SHOULDER ARTHROSCOPY WITH SUBSCAPULARIS AND SUPRASPINATUS REPAIR WITH OPEN SUPRAPECTORAL BICEPS TENODESIS, AS WELL AS DEBRIDEMENT AND ACROMIOPLASTY.  The risks benefits and alternatives were discussed with the patient including but not limited to the risks of nonoperative treatment, versus surgical intervention including infection, bleeding, nerve injury,  blood clots, cardiopulmonary complications, morbidity, mortality, among others, and they were willing to proceed.   Eulas Post, MD 01/13/2012 10:53 AM

## 2012-01-17 ENCOUNTER — Emergency Department: Payer: Self-pay | Admitting: Emergency Medicine

## 2012-01-17 LAB — URINALYSIS, COMPLETE
Bacteria: NONE SEEN
Bilirubin,UR: NEGATIVE
Blood: NEGATIVE
Glucose,UR: NEGATIVE mg/dL (ref 0–75)
Ketone: NEGATIVE
Leukocyte Esterase: NEGATIVE
Nitrite: NEGATIVE
Ph: 6 (ref 4.5–8.0)
Protein: NEGATIVE
RBC,UR: <1 /HPF (ref 0–5)
Specific Gravity: 1.012 (ref 1.003–1.030)
Squamous Epithelial: NONE SEEN
WBC UR: <1 /HPF (ref 0–5)

## 2012-01-17 LAB — COMPREHENSIVE METABOLIC PANEL
Albumin: 3.9 g/dL (ref 3.4–5.0)
Alkaline Phosphatase: 74 U/L (ref 50–136)
Anion Gap: 7 (ref 7–16)
BUN: 21 mg/dL — ABNORMAL HIGH (ref 7–18)
Bilirubin,Total: 0.8 mg/dL (ref 0.2–1.0)
Calcium, Total: 8.8 mg/dL (ref 8.5–10.1)
Chloride: 101 mmol/L (ref 98–107)
Co2: 27 mmol/L (ref 21–32)
Creatinine: 1.14 mg/dL (ref 0.60–1.30)
EGFR (African American): >60
EGFR (Non-African Amer.): >60
Glucose: 107 mg/dL — ABNORMAL HIGH (ref 65–99)
Osmolality: 274 (ref 275–301)
Potassium: 4.3 mmol/L (ref 3.5–5.1)
SGOT(AST): 57 U/L — ABNORMAL HIGH (ref 15–37)
SGPT (ALT): 49 U/L
Sodium: 135 mmol/L — ABNORMAL LOW (ref 136–145)
Total Protein: 7.4 g/dL (ref 6.4–8.2)

## 2012-01-17 LAB — CBC
HCT: 42.7 % (ref 40.0–52.0)
HGB: 14.3 g/dL (ref 13.0–18.0)
MCH: 31.5 pg (ref 26.0–34.0)
MCHC: 33.6 g/dL (ref 32.0–36.0)
MCV: 94 fL (ref 80–100)
Platelet: 217 10*3/uL (ref 150–440)
RBC: 4.54 10*6/uL (ref 4.40–5.90)
RDW: 12.7 % (ref 11.5–14.5)
WBC: 14.8 10*3/uL — ABNORMAL HIGH (ref 3.8–10.6)

## 2012-01-19 ENCOUNTER — Encounter (HOSPITAL_BASED_OUTPATIENT_CLINIC_OR_DEPARTMENT_OTHER): Payer: Self-pay

## 2012-03-27 ENCOUNTER — Encounter: Payer: Self-pay | Admitting: Family Medicine

## 2012-05-12 ENCOUNTER — Emergency Department: Payer: Self-pay | Admitting: Unknown Physician Specialty

## 2012-05-12 LAB — CBC
HCT: 44.7 % (ref 40.0–52.0)
HGB: 15.1 g/dL (ref 13.0–18.0)
MCH: 30.8 pg (ref 26.0–34.0)
MCHC: 33.7 g/dL (ref 32.0–36.0)
MCV: 91 fL (ref 80–100)
Platelet: 222 10*3/uL (ref 150–440)
RBC: 4.9 10*6/uL (ref 4.40–5.90)
RDW: 13.3 % (ref 11.5–14.5)
WBC: 10.6 10*3/uL (ref 3.8–10.6)

## 2012-05-12 LAB — BASIC METABOLIC PANEL
Anion Gap: 7 (ref 7–16)
BUN: 21 mg/dL — ABNORMAL HIGH (ref 7–18)
Calcium, Total: 8.9 mg/dL (ref 8.5–10.1)
Chloride: 107 mmol/L (ref 98–107)
Co2: 26 mmol/L (ref 21–32)
Creatinine: 0.86 mg/dL (ref 0.60–1.30)
EGFR (African American): >60
EGFR (Non-African Amer.): >60
Glucose: 101 mg/dL — ABNORMAL HIGH (ref 65–99)
Osmolality: 283 (ref 275–301)
Potassium: 4.2 mmol/L (ref 3.5–5.1)
Sodium: 140 mmol/L (ref 136–145)

## 2012-05-12 LAB — URINALYSIS, COMPLETE
Bilirubin,UR: NEGATIVE
Glucose,UR: NEGATIVE mg/dL (ref 0–75)
Ketone: NEGATIVE
Leukocyte Esterase: NEGATIVE
Nitrite: NEGATIVE
Ph: 9 (ref 4.5–8.0)
Protein: 30
RBC,UR: 1630 /HPF (ref 0–5)
Specific Gravity: 1.016 (ref 1.003–1.030)
Squamous Epithelial: 1
WBC UR: 15 /HPF (ref 0–5)

## 2012-05-16 DIAGNOSIS — N509 Disorder of male genital organs, unspecified: Secondary | ICD-10-CM | POA: Insufficient documentation

## 2012-05-16 DIAGNOSIS — R972 Elevated prostate specific antigen [PSA]: Secondary | ICD-10-CM | POA: Insufficient documentation

## 2012-05-16 DIAGNOSIS — F5232 Male orgasmic disorder: Secondary | ICD-10-CM | POA: Insufficient documentation

## 2012-05-16 DIAGNOSIS — N529 Male erectile dysfunction, unspecified: Secondary | ICD-10-CM | POA: Insufficient documentation

## 2012-05-16 DIAGNOSIS — R339 Retention of urine, unspecified: Secondary | ICD-10-CM | POA: Insufficient documentation

## 2012-05-18 DIAGNOSIS — N401 Enlarged prostate with lower urinary tract symptoms: Secondary | ICD-10-CM | POA: Insufficient documentation

## 2012-05-18 DIAGNOSIS — N138 Other obstructive and reflux uropathy: Secondary | ICD-10-CM | POA: Insufficient documentation

## 2013-05-20 ENCOUNTER — Encounter (HOSPITAL_BASED_OUTPATIENT_CLINIC_OR_DEPARTMENT_OTHER): Payer: Self-pay | Admitting: *Deleted

## 2013-05-20 ENCOUNTER — Other Ambulatory Visit: Payer: Self-pay | Admitting: Orthopedic Surgery

## 2013-05-20 NOTE — Progress Notes (Signed)
No labs needed-exercises and never smokes-no labs needed

## 2013-05-24 ENCOUNTER — Encounter (HOSPITAL_BASED_OUTPATIENT_CLINIC_OR_DEPARTMENT_OTHER)
Admission: RE | Disposition: A | Discharge status: Home / Self Care | Payer: Self-pay | Source: Ambulatory Visit | Attending: Orthopedic Surgery

## 2013-05-24 ENCOUNTER — Ambulatory Visit (HOSPITAL_BASED_OUTPATIENT_CLINIC_OR_DEPARTMENT_OTHER)
Admission: RE | Admit: 2013-05-24 | Discharge: 2013-05-24 | Disposition: A | Discharge status: Home / Self Care | Payer: 59 | Source: Ambulatory Visit | Attending: Orthopedic Surgery | Admitting: Orthopedic Surgery

## 2013-05-24 ENCOUNTER — Ambulatory Visit: Payer: Self-pay | Admitting: Podiatry

## 2013-05-24 ENCOUNTER — Ambulatory Visit (HOSPITAL_BASED_OUTPATIENT_CLINIC_OR_DEPARTMENT_OTHER): Payer: 59 | Admitting: Anesthesiology

## 2013-05-24 ENCOUNTER — Encounter (HOSPITAL_BASED_OUTPATIENT_CLINIC_OR_DEPARTMENT_OTHER): Payer: Self-pay | Admitting: Anesthesiology

## 2013-05-24 ENCOUNTER — Encounter (HOSPITAL_BASED_OUTPATIENT_CLINIC_OR_DEPARTMENT_OTHER): Payer: 59 | Admitting: Anesthesiology

## 2013-05-24 DIAGNOSIS — M19019 Primary osteoarthritis, unspecified shoulder: Secondary | ICD-10-CM | POA: Insufficient documentation

## 2013-05-24 DIAGNOSIS — S43499A Other sprain of unspecified shoulder joint, initial encounter: Secondary | ICD-10-CM | POA: Insufficient documentation

## 2013-05-24 DIAGNOSIS — S43429A Sprain of unspecified rotator cuff capsule, initial encounter: Secondary | ICD-10-CM | POA: Insufficient documentation

## 2013-05-24 DIAGNOSIS — N400 Enlarged prostate without lower urinary tract symptoms: Secondary | ICD-10-CM | POA: Insufficient documentation

## 2013-05-24 DIAGNOSIS — K219 Gastro-esophageal reflux disease without esophagitis: Secondary | ICD-10-CM | POA: Insufficient documentation

## 2013-05-24 DIAGNOSIS — S46819A Strain of other muscles, fascia and tendons at shoulder and upper arm level, unspecified arm, initial encounter: Secondary | ICD-10-CM | POA: Insufficient documentation

## 2013-05-24 DIAGNOSIS — M75101 Unspecified rotator cuff tear or rupture of right shoulder, not specified as traumatic: Secondary | ICD-10-CM

## 2013-05-24 DIAGNOSIS — I10 Essential (primary) hypertension: Secondary | ICD-10-CM | POA: Insufficient documentation

## 2013-05-24 DIAGNOSIS — I2510 Atherosclerotic heart disease of native coronary artery without angina pectoris: Secondary | ICD-10-CM | POA: Insufficient documentation

## 2013-05-24 DIAGNOSIS — E785 Hyperlipidemia, unspecified: Secondary | ICD-10-CM | POA: Insufficient documentation

## 2013-05-24 DIAGNOSIS — M25819 Other specified joint disorders, unspecified shoulder: Secondary | ICD-10-CM | POA: Insufficient documentation

## 2013-05-24 DIAGNOSIS — X58XXXA Exposure to other specified factors, initial encounter: Secondary | ICD-10-CM | POA: Insufficient documentation

## 2013-05-24 HISTORY — DX: Unspecified rotator cuff tear or rupture of right shoulder, not specified as traumatic: M75.101

## 2013-05-24 HISTORY — DX: Presence of spectacles and contact lenses: Z97.3

## 2013-05-24 HISTORY — PX: SHOULDER ARTHROSCOPY WITH ROTATOR CUFF REPAIR AND SUBACROMIAL DECOMPRESSION: SHX5686

## 2013-05-24 LAB — POCT HEMOGLOBIN-HEMACUE: Hemoglobin: 11.5 g/dL — ABNORMAL LOW (ref 13.0–17.0)

## 2013-05-24 SURGERY — SHOULDER ARTHROSCOPY WITH ROTATOR CUFF REPAIR AND SUBACROMIAL DECOMPRESSION
Anesthesia: General | Site: Shoulder | Laterality: Right | Wound class: Clean

## 2013-05-24 MED ORDER — FENTANYL CITRATE 0.05 MG/ML IJ SOLN
INTRAMUSCULAR | Status: AC
  Filled 2013-05-24: qty 2

## 2013-05-24 MED ORDER — HYDROMORPHONE HCL PF 1 MG/ML IJ SOLN: 0.2500 mg | INTRAMUSCULAR | Status: DC | PRN

## 2013-05-24 MED ORDER — CEFAZOLIN SODIUM-DEXTROSE 2-3 GM-% IV SOLR
INTRAVENOUS | Status: AC
  Filled 2013-05-24: qty 50

## 2013-05-24 MED ORDER — LIDOCAINE HCL (CARDIAC) 20 MG/ML IV SOLN
INTRAVENOUS | Status: DC | PRN
  Administered 2013-05-24: 100 mg via INTRAVENOUS

## 2013-05-24 MED ORDER — PROPOFOL 10 MG/ML IV BOLUS
INTRAVENOUS | Status: AC
  Filled 2013-05-24: qty 20

## 2013-05-24 MED ORDER — MIDAZOLAM HCL 2 MG/2ML IJ SOLN
1.0000 mg | INTRAMUSCULAR | Status: DC | PRN
  Administered 2013-05-24: 2 mg via INTRAVENOUS

## 2013-05-24 MED ORDER — SODIUM CHLORIDE 0.9 % IR SOLN
Status: DC | PRN
  Administered 2013-05-24: 18000 mL

## 2013-05-24 MED ORDER — SUCCINYLCHOLINE CHLORIDE 20 MG/ML IJ SOLN
INTRAMUSCULAR | Status: AC
  Filled 2013-05-24: qty 2

## 2013-05-24 MED ORDER — PROPOFOL 10 MG/ML IV EMUL
INTRAVENOUS | Status: AC
  Filled 2013-05-24: qty 50

## 2013-05-24 MED ORDER — DEXAMETHASONE SODIUM PHOSPHATE 4 MG/ML IJ SOLN
INTRAMUSCULAR | Status: DC | PRN
  Administered 2013-05-24: 10 mg via INTRAVENOUS

## 2013-05-24 MED ORDER — FENTANYL CITRATE 0.05 MG/ML IJ SOLN
50.0000 ug | INTRAMUSCULAR | Status: DC | PRN
  Administered 2013-05-24: 100 ug via INTRAVENOUS

## 2013-05-24 MED ORDER — CEFAZOLIN SODIUM-DEXTROSE 2-3 GM-% IV SOLR
2.0000 g | INTRAVENOUS | Status: AC
  Administered 2013-05-24: 2 g via INTRAVENOUS

## 2013-05-24 MED ORDER — MIDAZOLAM HCL 2 MG/2ML IJ SOLN
INTRAMUSCULAR | Status: AC
  Filled 2013-05-24: qty 2

## 2013-05-24 MED ORDER — PROMETHAZINE HCL 25 MG PO TABS: 25.0000 mg | ORAL_TABLET | Freq: Four times a day (QID) | ORAL | Status: DC | PRN

## 2013-05-24 MED ORDER — PROMETHAZINE HCL 25 MG/ML IJ SOLN: 6.2500 mg | INTRAMUSCULAR | Status: DC | PRN

## 2013-05-24 MED ORDER — PROPOFOL 10 MG/ML IV BOLUS
INTRAVENOUS | Status: DC | PRN
  Administered 2013-05-24: 200 mg via INTRAVENOUS

## 2013-05-24 MED ORDER — DEXAMETHASONE SODIUM PHOSPHATE 4 MG/ML IJ SOLN
INTRAMUSCULAR | Status: DC | PRN
  Administered 2013-05-24: 4 mg via INTRAVENOUS

## 2013-05-24 MED ORDER — OXYCODONE-ACETAMINOPHEN 5-325 MG PO TABS: 1.0000 | ORAL_TABLET | Freq: Four times a day (QID) | ORAL | Status: DC | PRN

## 2013-05-24 MED ORDER — OXYCODONE HCL 5 MG PO TABS: 5.0000 mg | ORAL_TABLET | Freq: Once | ORAL | Status: DC | PRN

## 2013-05-24 MED ORDER — LACTATED RINGERS IV SOLN
INTRAVENOUS | Status: DC
  Administered 2013-05-24 (×2): via INTRAVENOUS

## 2013-05-24 MED ORDER — SUCCINYLCHOLINE CHLORIDE 20 MG/ML IJ SOLN
INTRAMUSCULAR | Status: DC | PRN
  Administered 2013-05-24: 100 mg via INTRAVENOUS

## 2013-05-24 MED ORDER — OXYCODONE HCL 5 MG/5ML PO SOLN: 5.0000 mg | Freq: Once | ORAL | Status: DC | PRN

## 2013-05-24 MED ORDER — BUPIVACAINE-EPINEPHRINE PF 0.5-1:200000 % IJ SOLN
INTRAMUSCULAR | Status: DC | PRN
  Administered 2013-05-24: 150 mg

## 2013-05-24 MED ORDER — ONDANSETRON HCL 4 MG/2ML IJ SOLN
INTRAMUSCULAR | Status: DC | PRN
  Administered 2013-05-24: 4 mg via INTRAVENOUS

## 2013-05-24 SURGICAL SUPPLY — 65 items
ANCHOR SUT BIO SW 4.75X19.1 (Anchor) ×2 IMPLANT
APL SKNCLS STERI-STRIP NONHPOA (GAUZE/BANDAGES/DRESSINGS) ×1
BENZOIN TINCTURE PRP APPL 2/3 (GAUZE/BANDAGES/DRESSINGS) ×2 IMPLANT
BLADE CUTTER GATOR 3.5 (BLADE) ×2 IMPLANT
BLADE GREAT WHITE 4.2 (BLADE) IMPLANT
BLADE SURG 15 STRL LF DISP TIS (BLADE) IMPLANT
BLADE SURG 15 STRL SS (BLADE)
BUR OVAL 4.0 (BURR) IMPLANT
BUR OVAL 6.0 (BURR) ×2 IMPLANT
CANISTER OMNI JUG 16 LITER (MISCELLANEOUS) ×2 IMPLANT
CANNULA 5.75X71 LONG (CANNULA) ×2 IMPLANT
CANNULA TWIST IN 8.25X7CM (CANNULA) ×4 IMPLANT
DECANTER SPIKE VIAL GLASS SM (MISCELLANEOUS) IMPLANT
DRAPE INCISE IOBAN 66X45 STRL (DRAPES) ×2 IMPLANT
DRAPE SHOULDER BEACH CHAIR (DRAPES) ×2 IMPLANT
DRAPE U 20/CS (DRAPES) ×2 IMPLANT
DRAPE U-SHAPE 47X51 STRL (DRAPES) ×2 IMPLANT
DRSG PAD ABDOMINAL 8X10 ST (GAUZE/BANDAGES/DRESSINGS) ×2 IMPLANT
DURAPREP 26ML APPLICATOR (WOUND CARE) ×2 IMPLANT
ELECT REM PT RETURN 9FT ADLT (ELECTROSURGICAL)
ELECTRODE REM PT RTRN 9FT ADLT (ELECTROSURGICAL) IMPLANT
FIBERSTICK 2 (SUTURE) IMPLANT
GLOVE BIO SURGEON STRL SZ8 (GLOVE) ×2 IMPLANT
GLOVE BIOGEL PI IND STRL 7.0 (GLOVE) ×1 IMPLANT
GLOVE BIOGEL PI IND STRL 8 (GLOVE) ×2 IMPLANT
GLOVE BIOGEL PI INDICATOR 7.0 (GLOVE) ×1
GLOVE BIOGEL PI INDICATOR 8 (GLOVE) ×2
GLOVE ECLIPSE 6.5 STRL STRAW (GLOVE) ×2 IMPLANT
GLOVE ORTHO TXT STRL SZ7.5 (GLOVE) ×2 IMPLANT
GOWN BRE IMP PREV XXLGXLNG (GOWN DISPOSABLE) ×4 IMPLANT
GOWN PREVENTION PLUS XLARGE (GOWN DISPOSABLE) ×2 IMPLANT
IMMOBILIZER SHOULDER XLGE (ORTHOPEDIC SUPPLIES) IMPLANT
KIT SHOULDER TRACTION (DRAPES) ×2 IMPLANT
LASSO SUT 90 DEGREE (SUTURE) IMPLANT
NEEDLE SCORPION MULTI FIRE (NEEDLE) IMPLANT
PACK ARTHROSCOPY DSU (CUSTOM PROCEDURE TRAY) ×2 IMPLANT
PACK BASIN DAY SURGERY FS (CUSTOM PROCEDURE TRAY) ×2 IMPLANT
SET ARTHROSCOPY TUBING (MISCELLANEOUS) ×1
SET ARTHROSCOPY TUBING LN (MISCELLANEOUS) ×1 IMPLANT
SHEET MEDIUM DRAPE 40X70 STRL (DRAPES) ×2 IMPLANT
SLEEVE SCD COMPRESS KNEE MED (MISCELLANEOUS) ×2 IMPLANT
SLING ARM FOAM STRAP LRG (SOFTGOODS) IMPLANT
SLING ARM FOAM STRAP MED (SOFTGOODS) IMPLANT
SLING ARM FOAM STRAP XLG (SOFTGOODS) IMPLANT
SLING ARM IMMOBILIZER LRG (SOFTGOODS) ×2 IMPLANT
SLING ARM IMMOBILIZER MED (SOFTGOODS) IMPLANT
SPONGE GAUZE 4X4 12PLY (GAUZE/BANDAGES/DRESSINGS) ×2 IMPLANT
STRIP CLOSURE SKIN 1/2X4 (GAUZE/BANDAGES/DRESSINGS) ×2 IMPLANT
SUT FIBERWIRE #2 38 T-5 BLUE (SUTURE)
SUT LASSO 45 DEGREE (SUTURE) IMPLANT
SUT LASSO 45 DEGREE LEFT (SUTURE) IMPLANT
SUT LASSO 45D RIGHT (SUTURE) IMPLANT
SUT MNCRL AB 4-0 PS2 18 (SUTURE) ×2 IMPLANT
SUT PDS AB 1 CT  36 (SUTURE) ×1
SUT PDS AB 1 CT 36 (SUTURE) ×1 IMPLANT
SUT TIGER TAPE 7 IN WHITE (SUTURE) IMPLANT
SUT VIC AB 3-0 SH 27 (SUTURE)
SUT VIC AB 3-0 SH 27X BRD (SUTURE) IMPLANT
SUTURE FIBERWR #2 38 T-5 BLUE (SUTURE) IMPLANT
TAPE FIBER 2MM 7IN #2 BLUE (SUTURE) ×2 IMPLANT
TOWEL OR 17X24 6PK STRL BLUE (TOWEL DISPOSABLE) ×2 IMPLANT
TOWEL OR NON WOVEN STRL DISP B (DISPOSABLE) ×2 IMPLANT
TUBE CONNECTING 20X1/4 (TUBING) IMPLANT
WAND STAR VAC 90 (SURGICAL WAND) ×2 IMPLANT
WATER STERILE IRR 1000ML POUR (IV SOLUTION) ×2 IMPLANT

## 2013-05-24 NOTE — H&P (Signed)
PREOPERATIVE H&P  Chief Complaint: RIGHT SHOULDER IMPENGEMENT SYDROME, COMPLETE RUPTURE ROTATOR CUFF  HPI: Clayton Powell is a 67 y.o. male who presents for preoperative history and physical with a diagnosis of RIGHT SHOULDER IMPENGEMENT SYDROME, COMPLETE RUPTURE ROTATOR CUFF. Symptoms are rated as moderate to severe, and have been worsening.  This is significantly impairing activities of daily living.  He has elected for surgical management.   Past Medical History  Diagnosis Date  . GERD (gastroesophageal reflux disease) 12/05  . HLD (hyperlipidemia) 3/99  . BPH (benign prostatic hypertrophy) 6/07  . CAD (coronary artery disease) 2010    dx made by coronary calcium scoring, Normal stress test in 2010.   Marland Kitchen HTN (hypertension) 2002    no meds  . Left rotator cuff tear 08/12/2011    Per Dr. Dion Saucier 2013 L rotator cuff tear on MRI 2013   . Wears glasses    Past Surgical History  Procedure Laterality Date  . Colonoscopy  09/24/04    nml; Dr. Sharen Hint   . Spect ett nml  12/16/05  . Egd esophagitis by bx  01/26/06    gastritis bx neg h-pylori  . Admitted to [facility] chest pain  12/15/05    Dr. Juliann Pares  . Right and left prostate needle bx  05/02/03    acute inflammation; Dr. Patsi Sears   . Blepharoplasty  10/11    Dr. Kemper Durie   . Shoulder arthroscopy w/ rotator cuff repair  12/2011    left   History   Social History  . Marital Status: Married    Spouse Name: N/A    Number of Children: N/A  . Years of Education: N/A   Social History Main Topics  . Smoking status: Never Smoker   . Smokeless tobacco: Never Used  . Alcohol Use: Yes     Comment: beer, occ  . Drug Use: No  . Sexual Activity: None   Other Topics Concern  . 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    Plays guitar   Works out regularly.   Family History  Problem Relation Age of Onset  . Glaucoma      grandmother at 68   . Prostate cancer Neg Hx   . Colon cancer Neg Hx    Allergies  Allergen Reactions  . Ciprofloxacin Hcl     REACTION: rash  . Quinolones    Prior to Admission medications   Medication Sig Start Date End Date Taking? Authorizing Provider  albuterol (PROVENTIL HFA;VENTOLIN HFA) 108 (90 BASE) MCG/ACT inhaler Inhale 2 puffs into the lungs every 6 (six) hours as needed.   Yes Historical Provider, MD  aspirin 81 MG EC tablet Take 81 mg by mouth daily.     Yes Historical Provider, MD  Cholecalciferol (VITAMIN D) 2000 UNITS CAPS Take 1 capsule by mouth daily.     Yes Historical Provider, MD  fluticasone (FLONASE) 50 MCG/ACT nasal spray Place 2 sprays into the nose daily.     Yes Historical Provider, MD  Multiple Minerals-Vitamins (CITRACAL PLUS) TABS Take 1 tablet by mouth daily.     Yes Historical Provider, MD  Omega-3 Fatty Acids (FISH OIL CONCENTRATE) 1000 MG CAPS Take 1 capsule by mouth 2 (two) times daily.     Yes Historical Provider, MD  omeprazole (PRILOSEC) 20 MG capsule Take 20 mg by mouth daily.   Yes Historical  Provider, MD  ipratropium (ATROVENT) 0.06 % nasal spray Place 2 sprays into the nose daily.      Historical Provider, MD  Multiple Vitamin (MULTIVITAMIN) tablet Take 1 tablet by mouth daily.      Historical Provider, MD  NON FORMULARY Creatine Ethyl Ester 2250 mg Take one half tablet per day    Historical Provider, MD  promethazine (PHENERGAN) 25 MG tablet Take 1 tablet (25 mg total) by mouth every 6 (six) hours as needed for nausea. 01/13/12 01/20/12  Eulas Post, MD  tadalafil (CIALIS) 5 MG tablet Take 5 mg by mouth daily as needed.    Historical Provider, MD     Positive ROS: All other systems have been reviewed and were otherwise negative with the exception of those mentioned in the HPI and as above.  Physical Exam: General: Alert, no acute distress Cardiovascular: No pedal edema Respiratory: No cyanosis, no use of accessory musculature GI: No organomegaly, abdomen  is soft and non-tender Skin: No lesions in the area of chief complaint Neurologic: Sensation intact distally Psychiatric: Patient is competent for consent with normal mood and affect Lymphatic: No axillary or cervical lymphadenopathy  MUSCULOSKELETAL: Right shoulder AROM 0-160, positive drop arm sign, positive impingement and weakness with SS testing.  Assessment: RIGHT SHOULDER IMPENGEMENT SYDROME, COMPLETE RUPTURE ROTATOR CUFF  Plan: Plan for Procedure(s): RIGHT SHOULDER ARTHROSCOPY WITH ROTATOR CUFF REPAIR AND SUBACROMIAL DECOMPRESSION AND PARTIAL ACROMIOPLSTY WITH CORACOACROMIAL RELEASE, possible DISTAL CLAVICULECTOMY  The risks benefits and alternatives were discussed with the patient including but not limited to the risks of nonoperative treatment, versus surgical intervention including infection, bleeding, nerve injury,  blood clots, cardiopulmonary complications, morbidity, mortality, among others, and they were willing to proceed.   Eulas Post, MD Cell [phone]   05/24/2013 7:27 AM

## 2013-05-24 NOTE — Anesthesia Procedure Notes (Addendum)
Anesthesia Regional Block:  Interscalene brachial plexus block  Pre-Anesthetic Checklist: ,, timeout performed, Correct Patient, Correct Site, Correct Laterality, Correct Procedure, Correct Position, site marked, Risks and benefits discussed,  Surgical consent,  Pre-op evaluation,  At surgeon's request and post-op pain management  Laterality: Right  Prep: chloraprep       Needles:  Injection technique: Single-shot  Needle Type: Echogenic Stimulator Needle     Needle Length: 5cm 5 cm Needle Gauge: 22 and 22 G    Additional Needles:  Procedures: ultrasound guided (picture in chart) and nerve stimulator Interscalene brachial plexus block  Nerve Stimulator or Paresthesia:  Response: bicep contraction, 0.45 mA,   Additional Responses:   Narrative:  Start time: 05/24/2013 6:58 AM End time: 05/24/2013 7:08 AM Injection made incrementally with aspirations every 5 mL.  Performed by: Personally  Anesthesiologist: J. Adonis Huguenin, MD  Additional Notes: Functioning IV was confirmed and monitors applied.  A 50mm 22ga echogenic arrow stimulator was used. Sterile prep and drape,hand hygiene and sterile gloves were used.Ultrasound guidance: relevant anatomy identified, needle position confirmed, local anesthetic spread visualized around nerve(s)., vascular puncture avoided.  Image printed for medical record.  Negative aspiration and negative test dose prior to incremental administration of local anesthetic. The patient tolerated the procedure well.  Interscalene brachial plexus block Procedure Name: Intubation Date/Time: 05/24/2013 7:45 AM Performed by: Zenia Resides D Pre-anesthesia Checklist: Patient identified, Emergency Drugs available, Suction available and Patient being monitored Patient Re-evaluated:Patient Re-evaluated prior to inductionOxygen Delivery Method: Circle System Utilized Preoxygenation: Pre-oxygenation with 100% oxygen Intubation Type: IV induction Ventilation: Mask  ventilation without difficulty Laryngoscope Size: Mac and 3 Grade View: Grade II Tube type: Oral Tube size: 8.0 mm Number of attempts: 1 Airway Equipment and Method: stylet and oral airway Placement Confirmation: ETT inserted through vocal cords under direct vision,  positive ETCO2 and breath sounds checked- equal and bilateral Secured at: 23 cm Tube secured with: Tape Dental Injury: Teeth and Oropharynx as per pre-operative assessment

## 2013-05-24 NOTE — Anesthesia Postprocedure Evaluation (Signed)
Anesthesia Post Note  Patient: Clayton Powell  Procedure(s) Performed: Procedure(s) (LRB): RIGHT SHOULDER ARTHROSCOPY WITH ROTATOR CUFF REPAIR AND SUBACROMIAL DECOMPRESSION AND PARTIAL ACROMIOPLSTY WITH CORACOACROMIAL RELEASE, DISTAL CLAVICULECTOMY, LABRIAL DEBRIDEMENT (Right)  Anesthesia type: general  Patient location: PACU  Post pain: Pain level controlled  Post assessment: Patient's Cardiovascular Status Stable  Last Vitals:  Filed Vitals:   05/24/13 1015  BP: 150/85  Pulse: 69  Temp:   Resp: 17    Post vital signs: Reviewed and stable  Level of consciousness: sedated  Complications: No apparent anesthesia complications

## 2013-05-24 NOTE — Op Note (Signed)
05/24/2013  9:21 AM  PATIENT:  Clayton Powell    PRE-OPERATIVE DIAGNOSIS:  Right shoulder supraspinatus tear, impingement syndrome, a.c. joint arthropathy, probable labral tear  POST-OPERATIVE DIAGNOSIS:  Right shoulder full-thickness supraspinatus tear, impingement syndrome, a.c. joint arthropathy, labral tear superiorly and anteriorly  PROCEDURE:  Right shoulder arthroscopy with repair of supraspinatus, acromioplasty, distal clavicle resection, labral debridement  SURGEON:  Eulas Post, MD  PHYSICIAN ASSISTANT: Janace Litten, OPA-C, present and scrubbed throughout the case, critical for completion in a timely fashion, and for retraction, instrumentation, and closure.  ANESTHESIA:   General  PREOPERATIVE INDICATIONS:  Clayton Powell is a  67 y.o. male with a diagnosis of RIGHT SHOULDER IMPENGEMENT SYDROME, COMPLETE RUPTURE ROTATOR CUFF who failed conservative measures and elected for surgical management.    The risks benefits and alternatives were discussed with the patient preoperatively including but not limited to the risks of infection, bleeding, nerve injury, cardiopulmonary complications, the need for revision surgery, among others, and the patient was willing to proceed.  OPERATIVE IMPLANTS: Arthrex 4.75 mm swivel lock x1 with an inverted fiber tape  OPERATIVE FINDINGS: He had full motion during examination under anesthesia. The articular surface of the humeral head and glenoid were intact. There was substantial fraying anteriorly of the labrum, as well as superiorly. The biceps tendon was intact. The pulley was stable. The superior labral attachment had satisfactory stability, although there was some moderate degeneration. The supraspinatus had extreme degeneration with 90% tearing of the leading centimeter of the tendon. This was visualized both from the articular as well as the bursal side.  The CA ligament had substantial fraying and spurring as well as spurring  underneath the distal clavicle. The rotator cuff quality was fair, although not great. The tendon was mobile however.   OPERATIVE PROCEDURE: The patient was brought to the operating room and placed in the supine position. General anesthesia was administered. IV antibiotics were given. He was turned into the semilateral decubitus position. All bony prominences padded. The right upper extremity was examined and then prepped and draped in usual sterile fashion. It was suspended by the fishing pole with 15 pounds of traction. 10 pounds was not enough. Time out was performed.  Diagnostic arthroscopy was carried out the above-named findings. I used the arthroscopic shaver to debride the labrum anteriorly and superiorly. I also tagged the high-grade supraspinatus tear, using a Prolene suture, and then went to the subacromial space. I also debrided from below the undersurface of the supraspinatus.  From above I performed a complete bursectomy. The CA ligament was released using the cautery, and the shaver used to debride the subdeltoid fascia. Once I had satisfactory visualization I examined the rotator cuff tear from above, debrided the torn tissue, performed a light tubercle plasty using the shaver, and also performed an acromioplasty with a bur. This is a moderate acromioplasty, because there was a fair amount of subacromial spurring.  I placed a lateral cannula, completed the debridement of the cuff, which had a lamination tear, as well as retraction off of the bone, and then placed an inverted fiber tape suture on either side of the tear, and then anchored this through a superior cannula with a 4.75 mm bio composite Arthrex swivel lock.  Excellent tissue apposition to the bone was achieved. The anchor was placed just at the lateral margin of the tuberosity. The footprint was well covered.  I then exposed the distal clavicle, and resected approximately 1.1 cm. Satisfactory resection was confirmed from  multiple views.  I then drained shoulder, repaired the portals with Monocryl followed by Steri-Strips and sterile gauze. He was awakened and returned back in stable and satisfactory condition. There were no complications and he tolerated the procedure well.

## 2013-05-24 NOTE — Progress Notes (Signed)
Assisted Dr. Singer with right, ultrasound guided, interscalene  block. Side rails up, monitors on throughout procedure. See vital signs in flow sheet. Tolerated Procedure well. 

## 2013-05-24 NOTE — Anesthesia Preprocedure Evaluation (Addendum)
Anesthesia Evaluation  Patient identified by MRN, date of birth, ID band Patient awake    Reviewed: Allergy & Precautions, H&P , NPO status , Patient's Chart, lab work & pertinent test results, reviewed documented beta blocker date and time   Airway Mallampati: II TM Distance: >3 FB Neck ROM: full    Dental  (+) Teeth Intact and Dental Advidsory Given   Pulmonary neg pulmonary ROS,  breath sounds clear to auscultation  Pulmonary exam normal       Cardiovascular negative cardio ROS  Rhythm:regular Rate:Normal     Neuro/Psych negative neurological ROS  negative psych ROS   GI/Hepatic Neg liver ROS, GERD-  Medicated,  Endo/Other  negative endocrine ROS  Renal/GU negative Renal ROS     Musculoskeletal   Abdominal   Peds  Hematology negative hematology ROS (+)   Anesthesia Other Findings   Reproductive/Obstetrics negative OB ROS                          Anesthesia Physical Anesthesia Plan  ASA: II  Anesthesia Plan: General   Post-op Pain Management: MAC Combined w/ Regional for Post-op pain   Induction:   Airway Management Planned:   Additional Equipment:   Intra-op Plan:   Post-operative Plan:   Informed Consent: I have reviewed the patients History and Physical, chart, labs and discussed the procedure including the risks, benefits and alternatives for the proposed anesthesia with the patient or authorized representative who has indicated his/her understanding and acceptance.   Dental Advisory Given  Plan Discussed with: Anesthesiologist, CRNA and Surgeon  Anesthesia Plan Comments:         Anesthesia Quick Evaluation

## 2013-05-24 NOTE — Transfer of Care (Signed)
Immediate Anesthesia Transfer of Care Note  Patient: Clayton Powell  Procedure(s) Performed: Procedure(s): RIGHT SHOULDER ARTHROSCOPY WITH ROTATOR CUFF REPAIR AND SUBACROMIAL DECOMPRESSION AND PARTIAL ACROMIOPLSTY WITH CORACOACROMIAL RELEASE, DISTAL CLAVICULECTOMY, LABRIAL DEBRIDEMENT (Right)  Patient Location: PACU  Anesthesia Type:General and Regional  Level of Consciousness: awake, alert  and oriented  Airway & Oxygen Therapy: Patient Spontanous Breathing and Patient connected to face mask oxygen  Post-op Assessment: Report given to PACU RN and Post -op Vital signs reviewed and stable  Post vital signs: Reviewed and stable  Complications: No apparent anesthesia complications

## 2013-05-27 ENCOUNTER — Encounter (HOSPITAL_BASED_OUTPATIENT_CLINIC_OR_DEPARTMENT_OTHER): Payer: Self-pay | Admitting: Orthopedic Surgery

## 2013-05-28 ENCOUNTER — Ambulatory Visit: Payer: Self-pay | Admitting: Podiatry

## 2013-06-06 ENCOUNTER — Encounter: Payer: Self-pay | Admitting: Podiatry

## 2013-06-11 ENCOUNTER — Encounter: Payer: Self-pay | Admitting: Podiatry

## 2013-06-11 ENCOUNTER — Ambulatory Visit: Payer: 59 | Admitting: Podiatry

## 2013-06-11 VITALS — BP 140/85 | HR 68 | Resp 20 | Ht 66.0 in | Wt 190.0 lb

## 2013-06-11 DIAGNOSIS — M779 Enthesopathy, unspecified: Secondary | ICD-10-CM

## 2013-06-11 DIAGNOSIS — B351 Tinea unguium: Secondary | ICD-10-CM

## 2013-06-11 MED ORDER — TRIAMCINOLONE ACETONIDE 10 MG/ML IJ SUSP
5.0000 mg | Freq: Once | INTRAMUSCULAR | Status: AC
  Administered 2013-06-11: 5 mg via INTRA_ARTICULAR

## 2013-06-11 NOTE — Progress Notes (Signed)
  Subjective:    Patient ID: Clayton Powell, male    DOB: [birth year], 67 y.o.   MRN: [id number]  HPI    Review of Systems  Constitutional: Negative.   HENT: Negative.   Eyes: Negative.   Respiratory: Negative.   Cardiovascular: Negative.   Gastrointestinal: Negative.   Endocrine: Negative.   Genitourinary: Negative.   Musculoskeletal:       JOINT PAIN  Skin: Negative.   Allergic/Immunologic: Positive for environmental allergies.  Neurological: Negative.   Hematological: Negative.   Psychiatric/Behavioral: Negative.        Objective:   Physical Exam        Assessment & Plan:

## 2013-06-12 NOTE — Progress Notes (Signed)
Subjective:     Patient ID: Clayton Powell, male   DOB: 25-May-1946, 67 y.o.   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

## 2013-08-26 ENCOUNTER — Telehealth: Payer: Self-pay | Admitting: Family Medicine

## 2013-08-26 NOTE — Telephone Encounter (Signed)
My understanding was that he has uro follow up with Dr. Jacqlyn Larsen and lipids followed by clinic in [place].  If he is getting labs done at those clinic, then I don't need anything else.  He should bring copies of labs from those MDs.  Thanks.

## 2013-08-26 NOTE — Telephone Encounter (Signed)
Pt requesting script for cpe labs. Pt works at [employer] and would like to get them drawn at work. Please call pt when ready to pick up/

## 2013-08-28 NOTE — Telephone Encounter (Signed)
Patient notified as instructed by telephone. 

## 2013-10-22 ENCOUNTER — Ambulatory Visit (INDEPENDENT_AMBULATORY_CARE_PROVIDER_SITE_OTHER): Payer: 59 | Admitting: Family Medicine

## 2013-10-22 ENCOUNTER — Encounter: Payer: Self-pay | Admitting: Family Medicine

## 2013-10-22 ENCOUNTER — Telehealth: Payer: Self-pay | Admitting: Family Medicine

## 2013-10-22 VITALS — BP 128/70 | HR 76 | Temp 98.2°F | Ht 66.0 in | Wt 203.5 lb

## 2013-10-22 DIAGNOSIS — K219 Gastro-esophageal reflux disease without esophagitis: Secondary | ICD-10-CM

## 2013-10-22 DIAGNOSIS — Z0000 Encounter for general adult medical examination without abnormal findings: Secondary | ICD-10-CM

## 2013-10-22 DIAGNOSIS — E8881 Metabolic syndrome: Secondary | ICD-10-CM

## 2013-10-22 NOTE — Assessment & Plan Note (Signed)
Routine anticipatory guidance given to patient. See health maintenance.  Tetanus 2010  Zoster 2010  PNA 2013  Flu shot done at work.  COLONOSCOPY: NORMAL (DR. Epifanio Lesches) :(09/24/2004)  PSA per Jacqlyn Larsen.  Living will d/w pt. Wife designated if patient is incapacitated.  Diet "horrible." Discussed. Affected by work.  Exercise affected by rotator cuff surgery. He's trying to get back to working on exercise.

## 2013-10-22 NOTE — Assessment & Plan Note (Signed)
Controlled with PPI, can try to taper off.  D/w pt.  He agrees.

## 2013-10-22 NOTE — Patient Instructions (Signed)
I think you can gradually get the weight off.  You can try taking the omeprazole every other day for a while and then every third day.  I'll check on your colonoscopy due date.  Take care.

## 2013-10-22 NOTE — Assessment & Plan Note (Signed)
Weight up, he'll work on diet and exercise to get lipids back in range.  No change in meds.

## 2013-10-22 NOTE — Telephone Encounter (Signed)
Notify pt.  COLONOSCOPY: NORMAL (DR. Epifanio Lesches) :(09/24/2004).   Not due for repeat until next year.  Thanks.

## 2013-10-22 NOTE — Progress Notes (Signed)
Pre visit review using our clinic review tool, if applicable. No additional management support is needed unless otherwise documented below in the visit note.  CPE- See plan.  Routine anticipatory guidance given to patient.  See health maintenance. Tetanus 2010 Zoster 2010 PNA 2013 Flu shot done at work.   COLONOSCOPY: NORMAL (DR. Epifanio Lesches) :(09/24/2004) PSA per Jacqlyn Larsen.  Living will d/w pt.  Wife designated if patient is incapacitated.   Diet "horrible."  Discussed.  Affected by work.  Exercise affected by rotator cuff surgery. He's trying to get back to working on exercise.   GERD.  Better with omeprazole, no sx when on med.  Asking about options.  Weight inc noted.    Nasal allergy sx improved on meds per Dr. Tami Ribas.   PMH and SH reviewed  Meds, vitals, and allergies reviewed.   ROS: See HPI.  Otherwise negative.    GEN: nad, alert and oriented HEENT: mucous membranes moist NECK: supple w/o LA CV: rrr. 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  No extension deficit.  D/w pt about options.

## 2013-10-23 NOTE — Telephone Encounter (Signed)
Patient notified as instructed by telephone. 

## 2013-12-10 ENCOUNTER — Ambulatory Visit (INDEPENDENT_AMBULATORY_CARE_PROVIDER_SITE_OTHER): Payer: 59 | Admitting: Podiatry

## 2013-12-10 ENCOUNTER — Encounter: Payer: Self-pay | Admitting: Podiatry

## 2013-12-10 ENCOUNTER — Other Ambulatory Visit: Payer: Self-pay | Admitting: *Deleted

## 2013-12-10 DIAGNOSIS — M779 Enthesopathy, unspecified: Secondary | ICD-10-CM

## 2013-12-10 DIAGNOSIS — M2020 Hallux rigidus, unspecified foot: Secondary | ICD-10-CM

## 2013-12-10 MED ORDER — TRIAMCINOLONE ACETONIDE 10 MG/ML IJ SUSP
10.0000 mg | Freq: Once | INTRAMUSCULAR | Status: AC
  Administered 2013-12-10: 10 mg

## 2013-12-10 NOTE — Progress Notes (Signed)
Subjective:     Patient ID: Clayton Powell, male   DOB: 16-Jan-1946, 68 y.o.   MRN: [id number]  HPI patient presents stating I want an injection and also wanted asking about surgery for my big toe joint on my right foot. 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 Today I injected around the joint 3 mg Kenalog 5 mg Xylocaine Marcaine mixture and reappoint again in 5-6 months

## 2014-06-10 ENCOUNTER — Ambulatory Visit: Payer: 59 | Admitting: Podiatry

## 2014-06-13 ENCOUNTER — Ambulatory Visit: Payer: 59 | Admitting: Podiatry

## 2014-08-01 ENCOUNTER — Ambulatory Visit (INDEPENDENT_AMBULATORY_CARE_PROVIDER_SITE_OTHER): Payer: Medicare Other | Admitting: Podiatry

## 2014-08-01 ENCOUNTER — Ambulatory Visit (INDEPENDENT_AMBULATORY_CARE_PROVIDER_SITE_OTHER): Payer: Medicare Other

## 2014-08-01 VITALS — BP 140/92 | HR 100 | Resp 16

## 2014-08-01 DIAGNOSIS — M2021 Hallux rigidus, right foot: Secondary | ICD-10-CM

## 2014-08-01 DIAGNOSIS — M779 Enthesopathy, unspecified: Secondary | ICD-10-CM

## 2014-08-01 MED ORDER — TRIAMCINOLONE ACETONIDE 10 MG/ML IJ SUSP
10.0000 mg | Freq: Once | INTRAMUSCULAR | Status: AC
  Administered 2014-08-01: 10 mg

## 2014-08-03 NOTE — Progress Notes (Signed)
Subjective:     Patient ID: Clayton Powell, male   DOB: 1946/02/18, 69 y.o.   MRN: [id number]  HPI patient states my right big toe joint has started to become painful again and I wanted to get it checked and x-rayed and have medication put him   Review of Systems     Objective:   Physical Exam Neurovascular status intact muscle strength adequate with range of motion subtalar midtarsal joint normal with inflammation around the first metatarsal phalangeal joint right with slight reduction of motion of the joint surface and possible worsening of the symptoms over the last 6 months    Assessment:     Inflammatory capsulitis right first MPJ with hallux limitus deformity noted    Plan:     X-rays reviewed with patient and injected the right first MPJ 3 mg Kenalog 5 mg Xylocaine and advised on seeing him back again in 6 months. He understands this may ultimately take surgery

## 2014-09-24 ENCOUNTER — Telehealth: Payer: Self-pay | Admitting: Family Medicine

## 2014-09-24 DIAGNOSIS — Z1211 Encounter for screening for malignant neoplasm of colon: Secondary | ICD-10-CM

## 2014-09-24 NOTE — Telephone Encounter (Signed)
Patient said at his last visit Dr.Duncan told patient he's due for a Colonoscopy this month.  Patient said he can't remember where he had his last Colonoscopy done, but he would like the appointment in [place].

## 2014-09-25 NOTE — Addendum Note (Signed)
Addended by: Tonia Ghent on: 09/25/2014 08:47 AM   Modules accepted: Orders

## 2014-09-25 NOTE — Telephone Encounter (Signed)
Referral put in.  Thanks.  

## 2014-10-02 ENCOUNTER — Encounter: Payer: Self-pay | Admitting: Gastroenterology

## 2014-10-16 ENCOUNTER — Telehealth: Payer: Self-pay | Admitting: Family Medicine

## 2014-10-16 NOTE — Telephone Encounter (Signed)
Pt has a cpx on 4/1 and would like order to go [facility] Please advise when order is ready he will pick it up

## 2014-10-20 NOTE — Telephone Encounter (Signed)
Pt called in to check on status and he does need a handwritten order/Rx. He is requesting a call back once this is completed. He would like to have it by 3/29 if possible.

## 2014-10-20 NOTE — Telephone Encounter (Signed)
CMET and lipid. Dx I25.10.   That's all I need assuming he is still seeing uro about his PSA.  If so, printing this off should be enough.  If he is going to need a handwritten order, then I can provide an rx on a pad tomorrow. Thanks.

## 2014-10-20 NOTE — Telephone Encounter (Signed)
Patient advised. Order left at front desk for pick up.

## 2014-10-22 ENCOUNTER — Encounter: Payer: Self-pay | Admitting: Family Medicine

## 2014-10-24 ENCOUNTER — Encounter: Payer: Self-pay | Admitting: Family Medicine

## 2014-10-24 ENCOUNTER — Ambulatory Visit (INDEPENDENT_AMBULATORY_CARE_PROVIDER_SITE_OTHER): Payer: 59 | Admitting: Family Medicine

## 2014-10-24 VITALS — BP 138/88 | HR 78 | Temp 98.4°F | Ht 67.0 in | Wt 200.5 lb

## 2014-10-24 DIAGNOSIS — Z23 Encounter for immunization: Secondary | ICD-10-CM | POA: Diagnosis not present

## 2014-10-24 DIAGNOSIS — Z0000 Encounter for general adult medical examination without abnormal findings: Secondary | ICD-10-CM | POA: Diagnosis not present

## 2014-10-24 DIAGNOSIS — E8881 Metabolic syndrome: Secondary | ICD-10-CM

## 2014-10-24 DIAGNOSIS — F4329 Adjustment disorder with other symptoms: Secondary | ICD-10-CM

## 2014-10-24 DIAGNOSIS — Z7189 Other specified counseling: Secondary | ICD-10-CM

## 2014-10-24 NOTE — Patient Instructions (Signed)
Take care.  Glad to see you.  Get back into counseling and see if that helps.  If not, then let me know.  I think your numbers will improve with weight loss (and I expect you to be able to lose weight when your schedule lightens somewhat).

## 2014-10-24 NOTE — Progress Notes (Signed)
Pre visit review using our clinic review tool, if applicable. No additional management support is needed unless otherwise documented below in the visit note.  CPE- See plan.  Routine anticipatory guidance given to patient.  See health maintenance. Tetanus 11/12/2008 Flu done at work.  PNA 12-Nov-2008 Shingles 2011/11/13 Colonoscopy pending.   PSA per uro Living will d/w pt.  Wife designated if patient were incapacitated.  Diet and exercise d/w pt.  Weight is up.  See below.   Weight is up and that affected his lipids.  D/w pt.  He has f/u with the lipid clinic later this spring.    Mother in law living with patient, she has cancer.  Mother died in the fall of 11/12/13.  Daughter is pregnant.  Work continues to be demanding.  He had f/u with Dr. Jacqlyn Larsen with urology.  He is sleeping better after Dr. Jacqlyn Larsen put him on xanax.  He is trying to put off surgery for BPH and prostatitis.  "I'm a nervous wreck."  He has noted a tremor in his hands.  He anticipates a plateau with work. He doesn't want to retire and he still likes the job.    PMH and SH reviewed  Meds, vitals, and allergies reviewed.   ROS: See HPI.  Otherwise negative.    GEN: nad, alert and oriented HEENT: mucous membranes moist NECK: supple w/o LA CV: rrr. PULM: ctab, no inc wob ABD: soft, +bs EXT: no edema SKIN: no acute rash CN 2-12 wnl B, S/S wnl x4.  Flat DTRs B, symmetric

## 2014-10-26 DIAGNOSIS — Z7189 Other specified counseling: Secondary | ICD-10-CM | POA: Insufficient documentation

## 2014-10-26 NOTE — Assessment & Plan Note (Signed)
Tetanus 2010 Flu done at work.  PNA 2010 Shingles 2013 Colonoscopy pending.   PSA per uro Living will d/w pt.  Wife designated if patient were incapacitated.  Diet and exercise d/w pt.  Weight is up.  See below.

## 2014-10-27 DIAGNOSIS — F4320 Adjustment disorder, unspecified: Secondary | ICD-10-CM | POA: Insufficient documentation

## 2014-10-27 NOTE — Assessment & Plan Note (Signed)
He'll work on diet and exercise and f/u with his lipid MD at outside clinic.  Labs d/w pt.

## 2014-10-27 NOTE — Assessment & Plan Note (Signed)
No SI/HI.  He likely has a twitch in the L thumb, not a true tremor.   D/w pt.  Likely anxiety/adjustment related.  Sig upheaval.  He'll consider counseling.  He anticipated a plateau at work that should help.  He'll notify me if not improving.

## 2014-11-12 ENCOUNTER — Ambulatory Visit (AMBULATORY_SURGERY_CENTER): Payer: Self-pay | Admitting: *Deleted

## 2014-11-12 VITALS — Ht 67.0 in | Wt 206.4 lb

## 2014-11-12 DIAGNOSIS — Z1211 Encounter for screening for malignant neoplasm of colon: Secondary | ICD-10-CM

## 2014-11-12 MED ORDER — NA SULFATE-K SULFATE-MG SULF 17.5-3.13-1.6 GM/177ML PO SOLN: 1.0000 | Freq: Once | ORAL | Status: DC

## 2014-11-12 NOTE — Progress Notes (Signed)
Denies allergies to eggs or soy products. Denies complications with sedation or anesthesia. Denies O2 use. Denies use of diet or weight loss medications.  Emmi instructions given for colonoscopy.  

## 2014-11-13 ENCOUNTER — Encounter: Payer: Self-pay | Admitting: Gastroenterology

## 2014-11-25 ENCOUNTER — Encounter: Payer: Self-pay | Admitting: Gastroenterology

## 2014-11-26 ENCOUNTER — Encounter: Payer: Self-pay | Admitting: Gastroenterology

## 2015-01-16 ENCOUNTER — Ambulatory Visit (AMBULATORY_SURGERY_CENTER): Payer: 59 | Admitting: Gastroenterology

## 2015-01-16 ENCOUNTER — Encounter: Payer: Self-pay | Admitting: Gastroenterology

## 2015-01-16 VITALS — BP 134/76 | HR 57 | Temp 97.9°F | Resp 9 | Ht 67.0 in | Wt 206.0 lb

## 2015-01-16 DIAGNOSIS — Z1211 Encounter for screening for malignant neoplasm of colon: Secondary | ICD-10-CM

## 2015-01-16 DIAGNOSIS — D124 Benign neoplasm of descending colon: Secondary | ICD-10-CM | POA: Diagnosis not present

## 2015-01-16 DIAGNOSIS — K5730 Diverticulosis of large intestine without perforation or abscess without bleeding: Secondary | ICD-10-CM

## 2015-01-16 MED ORDER — SODIUM CHLORIDE 0.9 % IV SOLN: 500.0000 mL | INTRAVENOUS | Status: DC

## 2015-01-16 NOTE — Progress Notes (Signed)
Report to PACU, RN, vss, BBS= Clear.  

## 2015-01-16 NOTE — Patient Instructions (Signed)
Impressions/recommendations:  Polyp (handout given) Diverticulosis (handout given)  Repeat colonoscopy pending pathology results.  YOU HAD AN ENDOSCOPIC PROCEDURE TODAY AT [facility]:   Refer to the procedure report that was given to you for any specific questions about what was found during the examination.  If the procedure report does not answer your questions, please call your gastroenterologist to clarify.  If you requested that your care partner not be given the details of your procedure findings, then the procedure report has been included in a sealed envelope for you to review at your convenience later.  YOU SHOULD EXPECT: Some feelings of bloating in the abdomen. Passage of more gas than usual.  Walking can help get rid of the air that was put into your GI tract during the procedure and reduce the bloating. If you had a lower endoscopy (such as a colonoscopy or flexible sigmoidoscopy) you may notice spotting of blood in your stool or on the toilet paper. If you underwent a bowel prep for your procedure, you may not have a normal bowel movement for a few days.  Please Note:  You might notice some irritation and congestion in your nose or some drainage.  This is from the oxygen used during your procedure.  There is no need for concern and it should clear up in a day or so.  SYMPTOMS TO REPORT IMMEDIATELY:   Following lower endoscopy (colonoscopy or flexible sigmoidoscopy):  Excessive amounts of blood in the stool  Significant tenderness or worsening of abdominal pains  Swelling of the abdomen that is new, acute  Fever of 100F or higher  For urgent or emergent issues, a gastroenterologist can be reached at any hour by calling [phone].   DIET: Your first meal following the procedure should be a small meal and then it is ok to progress to your normal diet. Heavy or fried foods are harder to digest and may make you feel nauseous or bloated.  Likewise, meals heavy  in dairy and vegetables can increase bloating.  Drink plenty of fluids but you should avoid alcoholic beverages for 24 hours.  ACTIVITY:  You should plan to take it easy for the rest of today and you should NOT DRIVE or use heavy machinery until tomorrow (because of the sedation medicines used during the test).    FOLLOW UP: Our staff will call the number listed on your records the next business day following your procedure to check on you and address any questions or concerns that you may have regarding the information given to you following your procedure. If we do not reach you, we will leave a message.  However, if you are feeling well and you are not experiencing any problems, there is no need to return our call.  We will assume that you have returned to your regular daily activities without incident.  If any biopsies were taken you will be contacted by phone or by letter within the next 1-3 weeks.  Please call us at [phone] if you have not heard about the biopsies in 3 weeks.    SIGNATURES/CONFIDENTIALITY: You and/or your care partner have signed paperwork which will be entered into your electronic medical record.  These signatures attest to the fact that that the information above on your After Visit Summary has been reviewed and is understood.  Full responsibility of the confidentiality of this discharge information lies with you and/or your care-partner.

## 2015-01-16 NOTE — Progress Notes (Signed)
Called to room to assist during endoscopic procedure.  Patient ID and intended procedure confirmed with present staff. Received instructions for my participation in the procedure from the performing physician.  

## 2015-01-16 NOTE — Op Note (Signed)
[facility]  [address] [place], [id number]   COLONOSCOPY PROCEDURE REPORT  PATIENT: Clayton Powell, Clayton Powell  MR#: [id number] BIRTHDATE: 1945-12-16 , 68  yrs. old GENDER: male ENDOSCOPIST: Inda Castle, MD REFERRED BL:TJQZES Duncan, M.D. PROCEDURE DATE:  01/16/2015 PROCEDURE:   Colonoscopy, screening and Colonoscopy with snare polypectomy First Screening Colonoscopy - Avg.  risk and is 50 yrs.  old or older - No.      History of Adenoma - Now for follow-up colonoscopy & has been > or = to 3 yrs.  N/A  Polyps removed today? Yes ASA CLASS:   Class II INDICATIONS:Colorectal Neoplasm Risk Assessment for this procedure is average risk. MEDICATIONS: Monitored anesthesia care, Propofol 200 mg IV, and Lidocaine 100 mg IV  DESCRIPTION OF PROCEDURE:   After the risks benefits and alternatives of the procedure were thoroughly explained, informed consent was obtained.  The digital rectal exam revealed no abnormalities of the rectum.   The LB [id number]  endoscope was introduced through the anus and advanced to the cecum, which was identified by both the appendix and ileocecal valve. No adverse events experienced.   The quality of the prep was (Suprep was used) excellent.  The instrument was then slowly withdrawn as the colon was fully examined. Estimated blood loss is zero unless otherwise noted in this procedure report.      COLON FINDINGS: There was mild diverticulosis noted at the cecum and in the ascending colon.   A flat polyp measuring 4 mm in size was found in the descending colon.  A polypectomy was performed with a cold snare.  The resection was complete, the polyp tissue was completely retrieved and sent to histology.   There was mild diverticulosis noted in the descending colon.  Retroflexed views revealed no abnormalities. The time to cecum = 1.8 Withdrawal time = 7.5   The scope was withdrawn and the procedure completed. COMPLICATIONS: There were no  immediate complications.  ENDOSCOPIC IMPRESSION: 1.   Mild diverticulosis was noted at the cecum and in the ascending colon 2.   Flat polyp was found in the descending colon; polypectomy was performed with a cold snare 3.   Mild diverticulosis was noted in the descending colon  RECOMMENDATIONS: If the polyp(s) removed today are proven to be adenomatous (pre-cancerous) polyps, you will need a repeat colonoscopy in 5 years.  Otherwise you should continue to follow colorectal cancer screening guidelines for "routine risk" patients with colonoscopy in 10 years.  You will receive a letter within 1-2 weeks with the results of your biopsy as well as final recommendations.  Please call my office if you have not received a letter after 3 weeks.  eSigned:  Inda Castle, MD 01/16/2015 2:55 PM   cc:   PATIENT NAME:  Clayton Powell, Clayton Powell MR#: [id number]

## 2015-01-19 ENCOUNTER — Telehealth: Payer: Self-pay | Admitting: *Deleted

## 2015-01-19 NOTE — Telephone Encounter (Signed)
  Follow up Call-  Call back number 01/16/2015  Post procedure Call Back phone  # [phone]  Permission to leave phone message Yes     Patient questions:  Do you have a fever, pain , or abdominal swelling? No. Pain Score  0 *  Have you tolerated food without any problems? Yes.    Have you been able to return to your normal activities? Yes.    Do you have any questions about your discharge instructions: Diet   No. Medications  No. Follow up visit  No.  Do you have questions or concerns about your Care? No.  Actions: * If pain score is 4 or above: No action needed, pain <4.

## 2015-01-22 ENCOUNTER — Encounter: Payer: Self-pay | Admitting: Gastroenterology

## 2015-01-30 ENCOUNTER — Ambulatory Visit: Payer: Medicare Other | Admitting: Podiatry

## 2015-02-03 ENCOUNTER — Encounter: Payer: Self-pay | Admitting: Podiatry

## 2015-02-03 ENCOUNTER — Ambulatory Visit (INDEPENDENT_AMBULATORY_CARE_PROVIDER_SITE_OTHER): Payer: 59 | Admitting: Podiatry

## 2015-02-03 VITALS — BP 149/96 | HR 75 | Resp 17

## 2015-02-03 DIAGNOSIS — M205X1 Other deformities of toe(s) (acquired), right foot: Secondary | ICD-10-CM

## 2015-02-03 DIAGNOSIS — M779 Enthesopathy, unspecified: Secondary | ICD-10-CM | POA: Diagnosis not present

## 2015-02-03 MED ORDER — TRIAMCINOLONE ACETONIDE 10 MG/ML IJ SUSP
10.0000 mg | Freq: Once | INTRAMUSCULAR | Status: AC
Start: 2015-02-03 — End: 2015-02-03
  Administered 2015-02-03: 10 mg

## 2015-02-03 NOTE — Progress Notes (Signed)
Subjective: Mr. Raben presents to the office today stating that his big toe joint on the right foot has started to become more painful. He is requesting an injection at this time.  He wears orthotics on a regular basis. He states the steroid injections and orthotics seem to help as he wants to hold off on any surgical intervention at this time. No other complaints at this time and no acute changes since last appointment.   Objective: AAO x3, NAD DP/PT pulses palpable bilaterally, CRT less than 3 seconds Protective sensation intact with Simms Weinstein monofilament There is tenderness to palpation over the right 1st MTPJ and there is pain with ROM. There is a decrease in 1st MTPJ ROM on the right. There is a small amount of crepitance present during ROM. No other areas of tenderness to bilateral lower extremities. No open lesions or pre-ulcerative lesions.  No overlying edema, erythema, increase in warmth to bilateral lower extremities.  No pain with calf compression, swelling, warmth, erythema bilaterally.   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 At this time he would like to proceed with another steroid injection. He understands the risks of multiple steroid injections in to the joint. He wishes to proceed. Under sterile conditions a total of 2 cc of a mixture of kenalog 10 and 2% lidocaine plain was infiltrated into and around the right 1st MPTJ without complications. Band-aid was applied. He tolerated the injection well without any complications. Post injection care was discussed -Continue orthotics -Follow-up 6 months or sooner if any problems arise. In the meantime, encouraged to call the office with any questions, concerns, change in symptoms.   Celesta Gentile, DPM

## 2015-08-11 ENCOUNTER — Encounter: Payer: Self-pay | Admitting: Podiatry

## 2015-08-11 ENCOUNTER — Ambulatory Visit: Payer: 59 | Admitting: Podiatry

## 2015-08-11 ENCOUNTER — Ambulatory Visit (INDEPENDENT_AMBULATORY_CARE_PROVIDER_SITE_OTHER): Payer: 59 | Admitting: Podiatry

## 2015-08-11 VITALS — BP 157/87 | HR 77 | Resp 18

## 2015-08-11 DIAGNOSIS — M779 Enthesopathy, unspecified: Secondary | ICD-10-CM

## 2015-08-11 DIAGNOSIS — M205X1 Other deformities of toe(s) (acquired), right foot: Secondary | ICD-10-CM

## 2015-08-11 NOTE — Progress Notes (Signed)
Subjective: Clayton Powell presents to the office today for reoccurance of pan to this right 1st MTPJ and requesting a steroid injection. He wishes to hold off any any surgery for now. He wears orthotics on a regular basis. He states the steroid injections and orthotics seem to help as he wants to hold off on any surgical intervention at this time. No other complaints at this time and no acute changes since last appointment.   Objective: AAO x3, NAD DP/PT pulses palpable bilaterally, CRT less than 3 seconds Protective sensation intact with Simms Weinstein monofilament There is tenderness to palpation over the right 1st MTPJ and there is pain with ROM. There is a decrease in 1st MTPJ ROM on the right. There is a small amount of crepitance present during ROM. No other areas of tenderness to bilateral lower extremities. No open lesions or pre-ulcerative lesions.  No overlying edema, erythema, increase in warmth to bilateral lower extremities.  No pain with calf compression, swelling, warmth, erythema bilaterally.   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 At this time he would like to proceed with another steroid injection. He understands the risks of multiple steroid injections in to the joint. He wishes to proceed. Under sterile conditions a total of 2 cc of a mixture of kenalog 10 and 2% lidocaine plain was infiltrated into and around the right 1st MPTJ without complications. Band-aid was applied. He tolerated the injection well without any complications. Post injection care was discussed -Continue orthotics -Discussed surgical intervention -Follow-up 6 months or sooner if any problems arise. In the meantime, encouraged to call the office with any questions, concerns, change in symptoms.   Celesta Gentile, DPM

## 2015-09-03 ENCOUNTER — Encounter: Payer: Self-pay | Admitting: Internal Medicine

## 2015-09-03 ENCOUNTER — Ambulatory Visit (INDEPENDENT_AMBULATORY_CARE_PROVIDER_SITE_OTHER): Payer: 59 | Admitting: Internal Medicine

## 2015-09-03 VITALS — BP 130/78 | HR 74 | Temp 98.4°F | Wt 197.0 lb

## 2015-09-03 DIAGNOSIS — J069 Acute upper respiratory infection, unspecified: Secondary | ICD-10-CM

## 2015-09-03 DIAGNOSIS — B9789 Other viral agents as the cause of diseases classified elsewhere: Principal | ICD-10-CM

## 2015-09-03 MED ORDER — METHYLPREDNISOLONE ACETATE 80 MG/ML IJ SUSP
80.0000 mg | Freq: Once | INTRAMUSCULAR | Status: DC
  Administered 2015-09-03: 80 mg via INTRAMUSCULAR

## 2015-09-03 MED ORDER — AZITHROMYCIN 250 MG PO TABS: ORAL_TABLET | ORAL | Status: DC

## 2015-09-03 NOTE — Progress Notes (Signed)
Pre visit review using our clinic review tool, if applicable. No additional management support is needed unless otherwise documented below in the visit note. 

## 2015-09-03 NOTE — Progress Notes (Signed)
HPI  Pt presents to the clinic today with c/o runny nose, ear fullness and cough. This started 2 days ago. He is blowing clear mucous out of his nose. The cough is productive of green mucous. He denies shortness of breath. He does have some associated itchy watery, eyes. He denies fever but has had chills and body aches. He has tried Flonase, Zyrtec, Dayquil and Nyquil without any relief. He has no history of seasonal allergies. He has had sick contacts.  Review of Systems      Past Medical History  Diagnosis Date  . GERD (gastroesophageal reflux disease) 12/05  . HLD (hyperlipidemia) 3/99  . BPH (benign prostatic hypertrophy) 6/07  . CAD (coronary artery disease) 2010    dx made by coronary calcium scoring, Normal stress test in 2010.   Marland Kitchen HTN (hypertension) 2002    no meds  . Left rotator cuff tear 08/12/2011    Per Dr. Mardelle Matte 2013 L rotator cuff tear on MRI 2013   . Wears glasses   . Right rotator cuff tear 05/24/2013    Family History  Problem Relation Age of Onset  . Glaucoma      grandmother at 60  . Prostate cancer Neg Hx   . Colon cancer Neg Hx   . Stroke Mother     Social History   Social History  . Marital Status: Married    Spouse Name: N/A  . Number of Children: N/A  . Years of Education: N/A   Occupational History  . Not on file.   Social History Main Topics  . Smoking status: Never Smoker   . Smokeless tobacco: Never Used  . Alcohol Use: Yes     Comment: beer, occ  . Drug Use: No  . Sexual Activity: Not on file   Other Topics Concern  . 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    Plays guitar   Works out regularly.    Allergies  Allergen Reactions  . Ciprofloxacin     Other reaction(s): UNKNOWN  . Ciprofloxacin Hcl     REACTION: rash  . Quinolones      Constitutional: Denies headache, fatigue, fever or abrupt weight changes.   HEENT:  Positive ear fullness, runny nose. Denies eye redness, eye pain, pressure behind the eyes, facial pain, nasal congestion, ear pain, ringing in the ears, wax buildup, runny nose or sore throat. Respiratory: Positive cough. Denies difficulty breathing or shortness of breath.  Cardiovascular: Denies chest pain, chest tightness, palpitations or swelling in the hands or feet.   No other specific complaints in a complete review of systems (except as listed in HPI above).  Objective:   BP 130/78 mmHg  Pulse 74  Temp(Src) 98.4 F (36.9 C) (Oral)  Wt 197 lb (89.359 kg)  SpO2 96% Wt Readings from Last 3 Encounters:  09/03/15 197 lb (89.359 kg)  01/16/15 206 lb (93.441 kg)  11/12/14 206 lb 6.4 oz (93.622 kg)     General: Appears his stated age, well developed, well nourished in NAD. HEENT: Head: normal shape and size, no sinus tenderness noted; Eyes: sclera white, no icterus, conjunctiva pink; Ears: Tm's pink but intact, normal light reflex; Nose: mucosa boggy and moist, septum midline; Throat/Mouth: + PND. Teeth present, mucosa pink and moist, no exudate noted, no lesions or ulcerations noted.  Neck: Cervical lymphadenopathy, R>L.  Cardiovascular: Normal rate and rhythm. S1,S2 noted.  No murmur, rubs or gallops noted.  Pulmonary/Chest: Normal effort and positive vesicular breath sounds. No respiratory distress. No wheezes, rales or ronchi noted.      Assessment & Plan:   Upper Respiratory Infection:  Viral Get some rest and drink plenty of water Continue Zyrtec and Flonase 80 mg Depo IM today Printed Rx for Azithromax x 5 days to start on Sunday if symptoms persist or worsen   RTC as needed or if symptoms persist.

## 2015-09-03 NOTE — Patient Instructions (Signed)
Upper Respiratory Infection, Adult Most upper respiratory infections (URIs) are a viral infection of the air passages leading to the lungs. A URI affects the nose, throat, and upper air passages. The most common type of URI is nasopharyngitis and is typically referred to as "the common cold." URIs run their course and usually go away on their own. Most of the time, a URI does not require medical attention, but sometimes a bacterial infection in the upper airways can follow a viral infection. This is called a secondary infection. Sinus and middle ear infections are common types of secondary upper respiratory infections. Bacterial pneumonia can also complicate a URI. A URI can worsen asthma and chronic obstructive pulmonary disease (COPD). Sometimes, these complications can require emergency medical care and may be life threatening.  CAUSES Almost all URIs are caused by viruses. A virus is a type of germ and can spread from one person to another.  RISKS FACTORS You may be at risk for a URI if:   You smoke.   You have chronic heart or lung disease.  You have a weakened defense (immune) system.   You are very [place] or very old.   You have nasal allergies or asthma.  You work in crowded or poorly ventilated areas.  You work in health care facilities or schools. SIGNS AND SYMPTOMS  Symptoms typically develop 2-3 days after you come in contact with a cold virus. Most viral URIs last 7-10 days. However, viral URIs from the influenza virus (flu virus) can last 14-18 days and are typically more severe. Symptoms may include:   Runny or stuffy (congested) nose.   Sneezing.   Cough.   Sore throat.   Headache.   Fatigue.   Fever.   Loss of appetite.   Pain in your forehead, behind your eyes, and over your cheekbones (sinus pain).  Muscle aches.  DIAGNOSIS  Your health care provider may diagnose a URI by:  Physical exam.  Tests to check that your symptoms are not due to  another condition such as:  Strep throat.  Sinusitis.  Pneumonia.  Asthma. TREATMENT  A URI goes away on its own with time. It cannot be cured with medicines, but medicines may be prescribed or recommended to relieve symptoms. Medicines may help:  Reduce your fever.  Reduce your cough.  Relieve nasal congestion. HOME CARE INSTRUCTIONS   Take medicines only as directed by your health care provider.   Gargle warm saltwater or take cough drops to comfort your throat as directed by your health care provider.  Use a warm mist humidifier or inhale steam from a shower to increase air moisture. This may make it easier to breathe.  Drink enough fluid to keep your urine clear or pale yellow.   Eat soups and other clear broths and maintain good nutrition.   Rest as needed.   Return to work when your temperature has returned to normal or as your health care provider advises. You may need to stay home longer to avoid infecting others. You can also use a face mask and careful hand washing to prevent spread of the virus.  Increase the usage of your inhaler if you have asthma.   Do not use any tobacco products, including cigarettes, chewing tobacco, or electronic cigarettes. If you need help quitting, ask your health care provider. PREVENTION  The best way to protect yourself from getting a cold is to practice good hygiene.   Avoid oral or hand contact with people with cold   symptoms.   Wash your hands often if contact occurs.  There is no clear evidence that vitamin C, vitamin E, echinacea, or exercise reduces the chance of developing a cold. However, it is always recommended to get plenty of rest, exercise, and practice good nutrition.  SEEK MEDICAL CARE IF:   You are getting worse rather than better.   Your symptoms are not controlled by medicine.   You have chills.  You have worsening shortness of breath.  You have brown or red mucus.  You have yellow or brown nasal  discharge.  You have pain in your face, especially when you bend forward.  You have a fever.  You have swollen neck glands.  You have pain while swallowing.  You have white areas in the back of your throat. SEEK IMMEDIATE MEDICAL CARE IF:   You have severe or persistent:  Headache.  Ear pain.  Sinus pain.  Chest pain.  You have chronic lung disease and any of the following:  Wheezing.  Prolonged cough.  Coughing up blood.  A change in your usual mucus.  You have a stiff neck.  You have changes in your:  Vision.  Hearing.  Thinking.  Mood. MAKE SURE YOU:   Understand these instructions.  Will watch your condition.  Will get help right away if you are not doing well or get worse.   This information is not intended to replace advice given to you by your health care provider. Make sure you discuss any questions you have with your health care provider.   Document Released: 01/04/2001 Document Revised: 11/25/2014 Document Reviewed: 10/16/2013 Elsevier Interactive Patient Education 2016 Elsevier Inc.  

## 2015-09-10 ENCOUNTER — Ambulatory Visit (INDEPENDENT_AMBULATORY_CARE_PROVIDER_SITE_OTHER): Payer: 59 | Admitting: Primary Care

## 2015-09-10 ENCOUNTER — Encounter: Payer: Self-pay | Admitting: Primary Care

## 2015-09-10 VITALS — BP 118/78 | HR 75 | Temp 97.8°F | Wt 193.8 lb

## 2015-09-10 DIAGNOSIS — H9203 Otalgia, bilateral: Secondary | ICD-10-CM | POA: Diagnosis not present

## 2015-09-10 MED ORDER — NEOMYCIN-POLYMYXIN-HC 3.5-10000-1 OT SOLN: 4.0000 [drp] | Freq: Three times a day (TID) | OTIC | Status: DC

## 2015-09-10 NOTE — Progress Notes (Signed)
Pre visit review using our clinic review tool, if applicable. No additional management support is needed unless otherwise documented below in the visit note. 

## 2015-09-10 NOTE — Patient Instructions (Signed)
Start ear drops for ear discomfort/itching. Instill 4 drops into both ears three times daily for 7 days.  Continue Zyrtec daily.  Please notify myself or PCP if no improvement in symptoms.  It was a pleasure meeting you!

## 2015-09-10 NOTE — Progress Notes (Signed)
Subjective:    Patient ID: Clayton Powell, male    DOB: May 06, 1946, 70 y.o.   MRN: [id number]  HPI  Clayton Powell is a 70 year old male who presents today with a chief complaint of ear discomfort and fullness. He was evaluated on 09/03/15 with a 2 day complaint of URI symptoms. He was provided with an injection of IM Depo Medrol and a Zpak to fill Sunday this week if no improvement.   Since his last visit he's taken his Zpak and feels improved except for the ear fullness/discomfort. He also reports itching to his ears. He's taking Dayquil, Nyquil, Zyrtec without improvement. Denies fevers, chills, nausea.  Review of Systems  Constitutional: Negative for fever and chills.  HENT: Positive for congestion and ear pain. Negative for postnasal drip, sinus pressure and sore throat.        Ear fullness and itching.  Respiratory: Negative for cough and shortness of breath.   Cardiovascular: Negative for chest pain.       Past Medical History  Diagnosis Date  . GERD (gastroesophageal reflux disease) 12/05  . HLD (hyperlipidemia) 3/99  . BPH (benign prostatic hypertrophy) 6/07  . CAD (coronary artery disease) 2010    dx made by coronary calcium scoring, Normal stress test in 2010.   Marland Kitchen HTN (hypertension) 2002    no meds  . Left rotator cuff tear 08/12/2011    Per Dr. Mardelle Matte 2013 L rotator cuff tear on MRI 2013   . Wears glasses   . Right rotator cuff tear 05/24/2013    Social History   Social History  . Marital Status: Married    Spouse Name: N/A  . Number of Children: N/A  . Years of Education: N/A   Occupational History  . Not on file.   Social History Main Topics  . Smoking status: Never Smoker   . Smokeless tobacco: Never Used  . Alcohol Use: Yes     Comment: beer, occ  . Drug Use: No  . Sexual Activity: Not on file   Other Topics Concern  . 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    Plays guitar   Works out regularly.    Past Surgical History  Procedure Laterality Date  . Colonoscopy  09/24/04    nml; Dr. Epifanio Lesches   . Spect ett nml  12/16/05  . Egd esophagitis by bx  01/26/06    gastritis bx neg h-pylori  . Admitted to [facility] chest pain  12/15/05    Dr. Clayborn Bigness  . Right and left prostate needle bx  05/02/03    acute inflammation; Dr. Gaynelle Arabian   . Blepharoplasty  10/11    Dr. Loletta Specter   . Shoulder arthroscopy w/ rotator cuff repair  12/2011    left  . Shoulder arthroscopy with rotator cuff repair and subacromial decompression Right 05/24/2013    Procedure: RIGHT SHOULDER ARTHROSCOPY WITH ROTATOR CUFF REPAIR AND SUBACROMIAL DECOMPRESSION AND PARTIAL ACROMIOPLSTY WITH CORACOACROMIAL RELEASE, DISTAL CLAVICULECTOMY, LABRIAL DEBRIDEMENT;  Surgeon: Johnny Bridge, MD;  Location: [facility];  Service: Orthopedics;  Laterality: Right;    Family History  Problem Relation Age of Onset  . Glaucoma      grandmother at 22  . Prostate cancer Neg Hx   . Colon cancer Neg Hx   . Stroke Mother  Allergies  Allergen Reactions  . Ciprofloxacin     Other reaction(s): UNKNOWN  . Ciprofloxacin Hcl     REACTION: rash  . Quinolones     Current Outpatient Prescriptions on File Prior to Visit  Medication Sig Dispense Refill  . alfuzosin (UROXATRAL) 10 MG 24 hr tablet Take 10 mg by mouth daily with breakfast.    . aspirin 81 MG EC tablet Take 81 mg by mouth daily.      . Calcium Citrate 200 MG TABS Take by mouth.    . cetirizine (ZYRTEC) 10 MG tablet Take 10 mg by mouth daily.    . Cholecalciferol (VITAMIN D) 2000 UNITS CAPS Take 1 capsule by mouth daily.      . finasteride (PROSCAR) 5 MG tablet Take 5 mg by mouth daily.    . fluticasone (FLONASE) 50 MCG/ACT nasal spray     . Icosapent Ethyl (VASCEPA) 1 G CAPS Take 1 tablet by mouth 4 (four) times daily.    Marland Kitchen ipratropium (ATROVENT) 0.06 % nasal spray     . Multiple Vitamin (MULTIVITAMIN)  tablet Take 1 tablet by mouth daily.      Marland Kitchen omeprazole (PRILOSEC) 20 MG capsule Take 20 mg by mouth daily.    . tadalafil (CIALIS) 5 MG tablet Take 5 mg by mouth.     No current facility-administered medications on file prior to visit.    BP 118/78 mmHg  Pulse 75  Temp(Src) 97.8 F (36.6 C) (Oral)  Wt 193 lb 12.8 oz (87.907 kg)  SpO2 97%    Objective:   Physical Exam  Constitutional: He appears well-nourished.  HENT:  Right Ear: There is swelling. Tympanic membrane is erythematous.  Left Ear: Tympanic membrane is erythematous.  Mouth/Throat: Oropharynx is clear and moist.  Eyes: Conjunctivae are normal.  Neck: Neck supple.  Cardiovascular: Normal rate and regular rhythm.   Pulmonary/Chest: Effort normal and breath sounds normal. He has no wheezes. He has no rales.  Skin: Skin is warm and dry.          Assessment & Plan:  Otitis Externa:  Moderate erythema and slight swelling to canals bilaterally. Otherwise, exam unremarkable. Lungs clear. Based off of recent exam on 02/09, this is worse. Will treat with Cortisporin gtts. Continue Zyrtec for fullness. Ibuprofen PRN. Discussed return precautions if no improvement.

## 2015-09-23 ENCOUNTER — Encounter: Payer: Self-pay | Admitting: Family Medicine

## 2015-09-23 ENCOUNTER — Ambulatory Visit (INDEPENDENT_AMBULATORY_CARE_PROVIDER_SITE_OTHER): Payer: 59 | Admitting: Family Medicine

## 2015-09-23 VITALS — BP 126/76 | HR 77 | Temp 98.3°F | Wt 196.5 lb

## 2015-09-23 DIAGNOSIS — H6983 Other specified disorders of Eustachian tube, bilateral: Secondary | ICD-10-CM | POA: Diagnosis not present

## 2015-09-23 MED ORDER — PREDNISONE 20 MG PO TABS
20.0000 mg | ORAL_TABLET | Freq: Every day | ORAL | Status: DC
Start: 2015-09-23 — End: 2016-07-28

## 2015-09-23 NOTE — Patient Instructions (Signed)
Gently try to pop your ears and take prednisone with food.  If not better, we can set you up with ENT.  Take care.  Glad to see you.

## 2015-09-23 NOTE — Progress Notes (Signed)
Pre visit review using our clinic review tool, if applicable. No additional management support is needed unless otherwise documented below in the visit note.  His URI sx were better but he had otitis externa, treated at f/u OV 2 weeks ago.   No URI sx now, but his hearing is still muffled B.  Already on zyrtec, flonase and atrovent.  No ear pain.  Prev itching is resolved.  Still with ear pressure.   He can't get his ears to fully pop with a valsalva, will be only briefly better the feel plugged again. .    Meds, vitals, and allergies reviewed.   ROS: See HPI.  Otherwise, noncontributory.  GEN: nad, alert and oriented HEENT: mucous membranes moist, tm w/o erythema, nasal exam w/o erythema, scant clear discharge noted,  OP without cobblestoning, B ETD noted on exam.  NECK: supple w/o LA

## 2015-09-24 DIAGNOSIS — H6980 Other specified disorders of Eustachian tube, unspecified ear: Secondary | ICD-10-CM | POA: Insufficient documentation

## 2015-09-24 DIAGNOSIS — H6990 Unspecified Eustachian tube disorder, unspecified ear: Secondary | ICD-10-CM | POA: Insufficient documentation

## 2015-09-24 NOTE — Assessment & Plan Note (Signed)
Rinne and weber testing still normal.   Likely ETD.  Would try short course pred with cautions.  Continue valsalva.   If not better, then refer to ENT.  Anatomy d/w pt.  He agrees.  Okay for outpatient f/u.

## 2015-09-29 ENCOUNTER — Telehealth: Payer: Self-pay

## 2015-09-29 DIAGNOSIS — H6980 Other specified disorders of Eustachian tube, unspecified ear: Secondary | ICD-10-CM

## 2015-09-29 NOTE — Telephone Encounter (Signed)
Pt left v/m; pt was seen 09/23/15 and has finished prednisone and ear is still blocked and request referral ENT.

## 2015-09-29 NOTE — Telephone Encounter (Signed)
Referral done.  Thanks.

## 2015-11-11 ENCOUNTER — Other Ambulatory Visit: Payer: Self-pay | Admitting: Family Medicine

## 2015-11-11 DIAGNOSIS — H6980 Other specified disorders of Eustachian tube, unspecified ear: Secondary | ICD-10-CM

## 2015-12-02 DIAGNOSIS — H6993 Unspecified Eustachian tube disorder, bilateral: Secondary | ICD-10-CM | POA: Insufficient documentation

## 2015-12-02 DIAGNOSIS — H903 Sensorineural hearing loss, bilateral: Secondary | ICD-10-CM | POA: Insufficient documentation

## 2015-12-02 DIAGNOSIS — H6983 Other specified disorders of Eustachian tube, bilateral: Secondary | ICD-10-CM | POA: Insufficient documentation

## 2016-02-09 ENCOUNTER — Ambulatory Visit (INDEPENDENT_AMBULATORY_CARE_PROVIDER_SITE_OTHER): Payer: 59 | Admitting: Podiatry

## 2016-02-09 ENCOUNTER — Encounter: Payer: Self-pay | Admitting: Podiatry

## 2016-02-09 DIAGNOSIS — M779 Enthesopathy, unspecified: Secondary | ICD-10-CM

## 2016-02-09 DIAGNOSIS — M205X1 Other deformities of toe(s) (acquired), right foot: Secondary | ICD-10-CM

## 2016-02-09 NOTE — Progress Notes (Signed)
Patient ID: Clayton Powell, male   DOB: [birth year], 70 y.o.   MRN: [id number]  Subjective: Mr. Clayton Powell presents to the office today for reoccurance of pan to this right 1st MTPJ and requesting a steroid injection. He was doing well up until this week when the pain started to come back. No recent injury or trauma. No swelling or redness. No other concerns today and no new complaints.  Objective: AAO x3, NAD DP/PT pulses palpable bilaterally, CRT less than 3 seconds There is tenderness to palpation over the right 1st MTPJ and there is pain with ROM. There is a decrease in 1st MTPJ ROM on the right. There is a crepitance present during ROM. No other areas of tenderness to bilateral lower extremities. No open lesions or pre-ulcerative lesions.  No overlying edema, erythema, increase in warmth to bilateral lower extremities.  No pain with calf compression, swelling, warmth, erythema bilaterally.   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 At this time he would like to proceed with another steroid injection. He understands the risks of multiple steroid injections in to the joint. He wishes to proceed. Under sterile conditions a total of 2 cc of a mixture of kenalog 10 and 2% lidocaine plain was infiltrated into and around the right 1st MPTJ without complications. Band-aid was applied. He tolerated the injection well without any complications. Post injection care was discussed -Continue orthotics -Follow-up as needed. In the meantime, encouraged to call the office with any questions, concerns, change in symptoms.   Celesta Gentile, DPM

## 2016-05-24 DIAGNOSIS — H938X3 Other specified disorders of ear, bilateral: Secondary | ICD-10-CM | POA: Insufficient documentation

## 2016-05-24 DIAGNOSIS — F458 Other somatoform disorders: Secondary | ICD-10-CM | POA: Insufficient documentation

## 2016-07-28 ENCOUNTER — Ambulatory Visit (INDEPENDENT_AMBULATORY_CARE_PROVIDER_SITE_OTHER): Payer: 59 | Admitting: Podiatry

## 2016-07-28 ENCOUNTER — Encounter: Payer: Self-pay | Admitting: Podiatry

## 2016-07-28 DIAGNOSIS — M779 Enthesopathy, unspecified: Secondary | ICD-10-CM | POA: Diagnosis not present

## 2016-07-28 DIAGNOSIS — M205X1 Other deformities of toe(s) (acquired), right foot: Secondary | ICD-10-CM

## 2016-07-28 NOTE — Progress Notes (Signed)
Patient ID: GRZEGORZ HAMOR, male   DOB: 02/28/1946, 71 y.o.   MRN: [id number]  Subjective: Mr. Ming presents to the office today for reoccurance of pan to this right 1st MTPJ and he is requesting a steroid injection today to the big toe joint. The steroid injection he gets does help for some time before the pain starts to come back. He states the big toe joint becomes painful with prolonged walking. No recent injury. No other concerns today and no new complaints.  Objective: AAO x3, NAD DP/PT pulses palpable bilaterally, CRT less than 3 seconds There is tenderness to palpation over the right 1st MTPJ and there is pain with ROM. There is a decrease in 1st MTPJ ROM on the right. There is a crepitance with 1st MTPJ ROM.  There are no other areas of tenderness to bilateral lower extremities. No open lesions or pre-ulcerative lesions.  No overlying edema, erythema, increase in warmth to bilateral lower extremities.  No pain with calf compression, swelling, warmth, erythema bilaterally.   Assessment: Inflammatory capsulitis right first MPJ with hallux limitus.    Plan: -Treatment options discussed including all alternatives, risks, and complications -Again discussed with him both conservative and surgical treatment options.  -He would like to proceed with another steroid injection. He understands the risks of multiple steroid injections in to the joint. He wishes to proceed. Under sterile conditions amixture of kenalog 10 and 2% lidocaine plain was infiltrated into and around the right 1st MPTJ without complications. Band-aid was applied. He tolerated the injection well without any complications. Post injection care was discussed -Continue orthotics -Again discussed surgical intervention -Follow-up as needed. In the meantime, encouraged to call the office with any questions, concerns, change in symptoms.   Celesta Gentile, DPM

## 2016-09-02 DIAGNOSIS — H722X1 Other marginal perforations of tympanic membrane, right ear: Secondary | ICD-10-CM | POA: Insufficient documentation

## 2016-09-13 ENCOUNTER — Ambulatory Visit (INDEPENDENT_AMBULATORY_CARE_PROVIDER_SITE_OTHER): Payer: 59 | Admitting: Family Medicine

## 2016-09-13 ENCOUNTER — Encounter: Payer: Self-pay | Admitting: Family Medicine

## 2016-09-13 VITALS — BP 150/98 | HR 76 | Temp 97.8°F | Wt 206.8 lb

## 2016-09-13 DIAGNOSIS — M25562 Pain in left knee: Secondary | ICD-10-CM

## 2016-09-13 NOTE — Progress Notes (Signed)
BP (!) 150/98 (BP Location: Left Arm, Patient Position: Sitting, Cuff Size: Normal)   Pulse 76   Temp 97.8 F (36.6 C) (Oral)   Wt 206 lb 12.8 oz (93.8 kg)   SpO2 96%   BMI 32.39 kg/m    CC: L knee pain  Subjective:    Patient ID: Clayton Powell, male    DOB: 18-Aug-1945, 71 y.o.   MRN: [id number]  HPI: MATHEAU HABTE is a 71 y.o. male presenting on 09/13/2016 for Knee Pain (left knee)   2 wk h/o left medial knee pain worse with walking and getting in and out of car. No locking of knee or instability. Denies inciting trauma/injury to knee.   No regular exercise. Sedentary job.   He does take advil 600mg  TID for TMJ for the past month as well as using copper knee brace and heating pad for knee, not fully better however.   Relevant past medical, surgical, family and social history reviewed and updated as indicated. Interim medical history since our last visit reviewed. Allergies and medications reviewed and updated. Outpatient Medications Prior to Visit  Medication Sig Dispense Refill  . alfuzosin (UROXATRAL) 10 MG 24 hr tablet Take 10 mg by mouth daily with breakfast.    . aspirin 81 MG EC tablet Take 81 mg by mouth daily.      . Calcium Citrate 200 MG TABS Take by mouth.    . Cholecalciferol (VITAMIN D) 2000 UNITS CAPS Take 1 capsule by mouth daily.      . finasteride (PROSCAR) 5 MG tablet Take 5 mg by mouth daily.    Vanessa Kick Ethyl (VASCEPA) 1 G CAPS Take 1 tablet by mouth 4 (four) times daily.    . Multiple Vitamin (MULTIVITAMIN) tablet Take 1 tablet by mouth daily.      Marland Kitchen omeprazole (PRILOSEC) 20 MG capsule Take 20 mg by mouth daily.    . tadalafil (CIALIS) 5 MG tablet Take 5 mg by mouth.    . cetirizine (ZYRTEC) 10 MG tablet Take 10 mg by mouth daily.     No facility-administered medications prior to visit.      Per HPI unless specifically indicated in ROS section below Review of Systems     Objective:    BP (!) 150/98 (BP Location: Left Arm, Patient  Position: Sitting, Cuff Size: Normal)   Pulse 76   Temp 97.8 F (36.6 C) (Oral)   Wt 206 lb 12.8 oz (93.8 kg)   SpO2 96%   BMI 32.39 kg/m   Wt Readings from Last 3 Encounters:  09/13/16 206 lb 12.8 oz (93.8 kg)  09/23/15 196 lb 8 oz (89.1 kg)  09/10/15 193 lb 12.8 oz (87.9 kg)    Physical Exam  Constitutional: He appears well-developed and well-nourished. No distress.  Musculoskeletal: He exhibits no edema.  R knee WNL L Knee exam: No deformity on inspection. Tender to palpation of medial knee joint line No effusion/swelling noted. FROM in flex/extension without crepitus. No popliteal fullness. Neg drawer test. Neg mcmurray test. + pain with valgus testing  Mild PFgrind. No abnormal patellar mobility.   Skin: Skin is warm and dry. No rash noted. No erythema.  Psychiatric: He has a normal mood and affect.  Nursing note and vitals reviewed.     Assessment & Plan:  Elevated blood pressure noted - pt attributes to white coat hypertension. Discussed relation of NSAID use with elevated blood pressure. Pt will monitor at home, advised to notify us if  persistently elevated. Problem List Items Addressed This Visit    Left knee pain - Primary    2 wk h/o L knee pain - anticipate MCL sprain.  Supportive care reviewed - rec heating pad, continue copper knee sleeve, continue NSAIDs, elevation of leg. Provided with exercises from SM pt advisor on MCL sprain.  Update if not improved with treatment, consider return for xray of knee to eval arthritic burden of medial compartment. Pt agrees with plan.           Follow up plan: Return if symptoms worsen or fail to improve.  Ria Bush, MD

## 2016-09-13 NOTE — Patient Instructions (Signed)
I think you have MCL sprain - continue copper knee brace, heating pad, advil as up to now. Start exercises provided today. Update [place] in 2 weeks with effect - if no better, come in for xray of knee.

## 2016-09-13 NOTE — Assessment & Plan Note (Signed)
2 wk h/o L knee pain - anticipate MCL sprain.  Supportive care reviewed - rec heating pad, continue copper knee sleeve, continue NSAIDs, elevation of leg. Provided with exercises from SM pt advisor on MCL sprain.  Update if not improved with treatment, consider return for xray of knee to eval arthritic burden of medial compartment. Pt agrees with plan.

## 2016-09-13 NOTE — Progress Notes (Signed)
Pre visit review using our clinic review tool, if applicable. No additional management support is needed unless otherwise documented below in the visit note. 

## 2017-01-10 ENCOUNTER — Ambulatory Visit (INDEPENDENT_AMBULATORY_CARE_PROVIDER_SITE_OTHER): Payer: 59 | Admitting: Podiatry

## 2017-01-10 ENCOUNTER — Encounter: Payer: Self-pay | Admitting: Podiatry

## 2017-01-10 DIAGNOSIS — M7751 Other enthesopathy of right foot: Secondary | ICD-10-CM | POA: Diagnosis not present

## 2017-01-10 DIAGNOSIS — M205X1 Other deformities of toe(s) (acquired), right foot: Secondary | ICD-10-CM

## 2017-01-14 NOTE — Progress Notes (Signed)
   HPI: Patient is a 71 year old man presenting today for a complaint of right great toe pain that began 2-3 weeks ago. He states he believes it is a capsulitis flare. He is requesting another injection. He denies any new trauma, injury or fall. He denies any other complaints at this time.    Physical Exam: General: The patient is alert and oriented x3 in no acute distress.  Dermatology: Skin is warm, dry and supple bilateral lower extremities. Negative for open lesions or macerations.  Vascular: Palpable pedal pulses bilaterally. No edema or erythema noted. Capillary refill within normal limits.  Neurological: Epicritic and protective threshold grossly intact bilaterally.   Musculoskeletal Exam: Pain on palpation with limited range of motion noted to the first MPJ of the right foot. Range of motion within normal limits to all pedal and ankle joints bilateral. Muscle strength 5/5 in all groups bilateral.    Assessment: 1. First MPJ capsulitis of the right foot. 2. Hallux limitus right   Plan of Care:  1. Patient was evaluated. 2. Injection of 0.5 mLs Celestone Soluspan injected into the first MPJ of the right foot. 3. Discussed possible future cheilectomy surgery versus joint arthroplasty. 4. Return to clinic when necessary.  Pt is an avid golfer.    Edrick Kins, DPM [facility]  Dr. Edrick Kins, [address]                                        [address] [place], [place] [address]                Office [phone]  Fax [phone]

## 2017-01-16 MED ORDER — BETAMETHASONE SOD PHOS & ACET 6 (3-3) MG/ML IJ SUSP: 3.0000 mg | Freq: Once | INTRAMUSCULAR | Status: DC

## 2017-04-11 ENCOUNTER — Telehealth: Payer: Self-pay | Admitting: Family Medicine

## 2017-04-11 ENCOUNTER — Encounter: Payer: Self-pay | Admitting: Internal Medicine

## 2017-04-11 ENCOUNTER — Ambulatory Visit (INDEPENDENT_AMBULATORY_CARE_PROVIDER_SITE_OTHER): Payer: 59 | Admitting: Internal Medicine

## 2017-04-11 DIAGNOSIS — K529 Noninfective gastroenteritis and colitis, unspecified: Secondary | ICD-10-CM | POA: Diagnosis not present

## 2017-04-11 MED ORDER — AZITHROMYCIN 250 MG PO TABS: 1000.0000 mg | ORAL_TABLET | Freq: Once | ORAL | 0 refills | Status: AC

## 2017-04-11 NOTE — Patient Instructions (Signed)
Keep hydrated to replace fluids.   We have sent in the azithromycin to take 4 pills at once today.    Bland Diet A bland diet consists of foods that do not have a lot of fat or fiber. Foods without fat or fiber are easier for the body to digest. They are also less likely to irritate your mouth, throat, stomach, and other parts of your gastrointestinal tract. A bland diet is sometimes called a BRAT diet. What is my plan? Your health care provider or dietitian may recommend specific changes to your diet to prevent and treat your symptoms, such as:  Eating small meals often.  Cooking food until it is soft enough to chew easily.  Chewing your food well.  Drinking fluids slowly.  Not eating foods that are very spicy, sour, or fatty.  Not eating citrus fruits, such as oranges and grapefruit.  What do I need to know about this diet?  Eat a variety of foods from the bland diet food list.  Do not follow a bland diet longer than you have to.  Ask your health care provider whether you should take vitamins. What foods can I eat? Grains  Hot cereals, such as cream of wheat. Bread, crackers, or tortillas made from refined white flour. Rice. Vegetables Canned or cooked vegetables. Mashed or boiled potatoes. Fruits Bananas. Applesauce. Other types of cooked or canned fruit with the skin and seeds removed, such as canned peaches or pears. Meats and Other Protein Sources Scrambled eggs. Creamy peanut butter or other nut butters. Lean, well-cooked meats, such as chicken or fish. Tofu. Soups or broths. Dairy Low-fat dairy products, such as milk, cottage cheese, or yogurt. Beverages Water. Herbal tea. Apple juice. Sweets and Desserts Pudding. Custard. Fruit gelatin. Ice cream. Fats and Oils Mild salad dressings. Canola or olive oil. The items listed above may not be a complete list of allowed foods or beverages. Contact your dietitian for more options. What foods are not  recommended? Foods and ingredients that are often not recommended include:  Spicy foods, such as hot sauce or salsa.  Fried foods.  Sour foods, such as pickled or fermented foods.  Raw vegetables or fruits, especially citrus or berries.  Caffeinated drinks.  Alcohol.  Strongly flavored seasonings or condiments.  The items listed above may not be a complete list of foods and beverages that are not allowed. Contact your dietitian for more information. This information is not intended to replace advice given to you by your health care provider. Make sure you discuss any questions you have with your health care provider. Document Released: 11/02/2015 Document Revised: 12/17/2015 Document Reviewed: 07/23/2014 Elsevier Interactive Patient Education  2018 [employer].

## 2017-04-11 NOTE — Telephone Encounter (Signed)
Patient Name: [facility] Vargus DOB: Jan 14, 1946 Initial Comment Caller states c/o diarrhea and general weakness. He said he just returned from [place] on Friday. Nurse Assessment Nurse: Joline Salt, RN, Malachy Mood Date/Time Eilene Ghazi Time): 04/11/2017 2:26:53 PM Confirm and document reason for call. If symptomatic, describe symptoms. ---Caller states he has diarrhea, fever, chills and general weakness. He said he just returned from [place] on Friday. Taking kaopectate. Does the patient have any new or worsening symptoms? ---Yes Will a triage be completed? ---Yes Related visit to physician within the last 2 weeks? ---N/A Does the PT have any chronic conditions? (i.e. diabetes, asthma, etc.) ---No Is this a behavioral health or substance abuse call? ---No Guidelines Guideline Title Affirmed Question Affirmed Notes Diarrhea [1] SEVERE diarrhea (e.g., 7 or more times / day more than normal) AND [2] age > 60 years Final Disposition User See Physician within 4 Hours (or PCP triage) Joline Salt, RN, Cheryl Referrals REFERRED TO PCP OFFICE Disagree/Comply: Comply

## 2017-04-11 NOTE — Progress Notes (Signed)
   Subjective:    Patient ID: Clayton Powell, male    DOB: Jun 29, 1946, 71 y.o.   MRN: [id number]  HPI The patient is a 71 YO man coming in for diarrhea, chills, and cramping. Recently [place] in [place] and he has been to previously without problems. Returned on Friday and symptoms started Saturday. No other family members got the same symptoms. Started with abdominal cramping, chills, and diarrhea which started loose and progressed to liquid. He denies nausea or vomiting. No blood in the diarrhea. He has had improvement in the last 2 days in the cramps but diarrhea still 7 times per day. Not slowing down and not firming up at all. Has been doing brat diet without improvement. Not worsening.   Review of Systems  Constitutional: Positive for appetite change and chills. Negative for activity change, fatigue, fever and unexpected weight change.  HENT: Negative.   Eyes: Negative.   Respiratory: Negative.   Cardiovascular: Negative.   Gastrointestinal: Positive for diarrhea. Negative for abdominal distention, abdominal pain, anal bleeding, blood in stool, constipation, nausea and vomiting.  Musculoskeletal: Negative.   Neurological: Negative.       Objective:   Physical Exam  Constitutional: He is oriented to person, place, and time. He appears well-developed and well-nourished. No distress.  HENT:  Head: Normocephalic and atraumatic.  Eyes: EOM are normal.  Neck: Normal range of motion.  Cardiovascular: Normal rate and regular rhythm.   Pulmonary/Chest: Effort normal and breath sounds normal.  Abdominal: Soft. Bowel sounds are normal. He exhibits no distension. There is no tenderness. There is no rebound.  Musculoskeletal: He exhibits no edema.  Neurological: He is alert and oriented to person, place, and time. Coordination normal.  Skin: Skin is warm and dry.  No tenting.   Vitals:   04/11/17 1615  BP: 118/86  Pulse: 64  Temp: 98.4 F (36.9 C)  TempSrc:  Oral  SpO2: 98%  Weight: 194 lb (88 kg)  Height: 5\' 7"  (1.702 m)      Assessment & Plan:

## 2017-04-11 NOTE — Telephone Encounter (Signed)
Pt has appt with Dr Pricilla Holm 04/11/17 at 4:15.

## 2017-04-12 DIAGNOSIS — K529 Noninfective gastroenteritis and colitis, unspecified: Secondary | ICD-10-CM | POA: Insufficient documentation

## 2017-04-12 NOTE — Assessment & Plan Note (Signed)
Rx for azithromycin 1000 mg single dose in case traveler's diarrhea. Explained that even if resort was safe he could have been exposed or also while traveling could have been exposed. Likely viral and encouraged to drink plenty of fluids and brat diet. Can try imodium if needed.

## 2017-07-14 ENCOUNTER — Ambulatory Visit: Payer: 59 | Admitting: Podiatry

## 2017-07-14 ENCOUNTER — Ambulatory Visit (INDEPENDENT_AMBULATORY_CARE_PROVIDER_SITE_OTHER): Payer: 59

## 2017-07-14 DIAGNOSIS — M205X1 Other deformities of toe(s) (acquired), right foot: Secondary | ICD-10-CM

## 2017-07-14 NOTE — Patient Instructions (Signed)
Pre-Operative Instructions  Congratulations, you have decided to take an important step towards improving your quality of life.  You can be assured that the doctors and staff at [facility] will be with you every step of the way.  Here are some important things you should know:  1. Plan to be at the surgery center/hospital at least 1 (one) hour prior to your scheduled time, unless otherwise directed by the surgical center/hospital staff.  You must have a responsible adult accompany you, remain during the surgery and drive you home.  Make sure you have directions to the surgical center/hospital to ensure you arrive on time. 2. If you are having surgery at [facility] or [facility], you will need a copy of your medical history and physical form from your family physician within one month prior to the date of surgery. We will give you a form for your primary physician to complete.  3. We make every effort to accommodate the date you request for surgery.  However, there are times where surgery dates or times have to be moved.  We will contact you as soon as possible if a change in schedule is required.   4. No aspirin/ibuprofen for one week before surgery.  If you are on aspirin, any non-steroidal anti-inflammatory medications (Mobic, Aleve, Ibuprofen) should not be taken seven (7) days prior to your surgery.  You make take Tylenol for pain prior to surgery.  5. Medications - If you are taking daily heart and blood pressure medications, seizure, reflux, allergy, asthma, anxiety, pain or diabetes medications, make sure you notify the surgery center/hospital before the day of surgery so they can tell you which medications you should take or avoid the day of surgery. 6. No food or drink after midnight the night before surgery unless directed otherwise by surgical center/hospital staff. 7. No alcoholic beverages 24-hours prior to surgery.  No smoking 24-hours prior or 24-hours after  surgery. 8. Wear loose pants or shorts. They should be loose enough to fit over bandages, boots, and casts. 9. Don't wear slip-on shoes. Sneakers are preferred. 10. Bring your boot with you to the surgery center/hospital.  Also bring crutches or a walker if your physician has prescribed it for you.  If you do not have this equipment, it will be provided for you after surgery. 11. If you have not been contacted by the surgery center/hospital by the day before your surgery, call to confirm the date and time of your surgery. 12. Leave-time from work may vary depending on the type of surgery you have.  Appropriate arrangements should be made prior to surgery with your employer. 13. Prescriptions will be provided immediately following surgery by your doctor.  Fill these as soon as possible after surgery and take the medication as directed. Pain medications will not be refilled on weekends and must be approved by the doctor. 14. Remove nail polish on the operative foot and avoid getting pedicures prior to surgery. 15. Wash the night before surgery.  The night before surgery wash the foot and leg well with water and the antibacterial soap provided. Be sure to pay special attention to beneath the toenails and in between the toes.  Wash for at least three (3) minutes. Rinse thoroughly with water and dry well with a towel.  Perform this wash unless told not to do so by your physician.  Enclosed: 1 Ice pack (please put in freezer the night before surgery)   1 Hibiclens skin cleaner     Pre-op instructions  If you have any questions regarding the instructions, please do not hesitate to call our office.  [place]: [address], [place], [place] [address] -- [phone]  [place]: [address] [place], [place] [address] -- [phone]  [place]: [address]  [place], [place] [address] -- [phone]  [place]: [address], [place], [place] [address] -- [phone]  Website:  [email or web address] 

## 2017-07-19 ENCOUNTER — Telehealth: Payer: Self-pay | Admitting: *Deleted

## 2017-07-19 NOTE — Telephone Encounter (Signed)
Pt states he would like to schedule for surgery the end of January or beginning of February, for CPT code 28289 Cheilectomy.

## 2017-07-19 NOTE — Telephone Encounter (Signed)
I spoke with pt and he states he is driving, will call us back to schedule.

## 2017-07-20 NOTE — Progress Notes (Signed)
   HPI: Patient is a 71 year old male presenting today for follow-up evaluation of right great toe pain.  Patient is considering having surgery for this problem.  He is currently requesting an injection.  He states wearing orthotics does not provide any relief.  Patient is here for further evaluation and treatment.   Past Medical History:  Diagnosis Date  . BPH (benign prostatic hypertrophy) 6/07  . CAD (coronary artery disease) 2010   dx made by coronary calcium scoring, Normal stress test in 2010.   Marland Kitchen GERD (gastroesophageal reflux disease) 12/05  . HLD (hyperlipidemia) 3/99  . HTN (hypertension) 2002   no meds  . Left rotator cuff tear 08/12/2011   Per Dr. Mardelle Matte 2013 L rotator cuff tear on MRI 2013   . Right rotator cuff tear 05/24/2013  . Wears glasses       Physical Exam: General: The patient is alert and oriented x3 in no acute distress.  Dermatology: Skin is warm, dry and supple bilateral lower extremities. Negative for open lesions or macerations.  Vascular: Palpable pedal pulses bilaterally. No edema or erythema noted. Capillary refill within normal limits.  Neurological: Epicritic and protective threshold grossly intact bilaterally.   Musculoskeletal Exam: Pain on palpation with limited range of motion noted to the first MPJ of the right foot. Range of motion within normal limits to all pedal and ankle joints bilateral. Muscle strength 5/5 in all groups bilateral.   Radiographic Exam: DJD with bone spur formation and joint space destruction of the first MTPJ of the right foot.   Assessment: 1. DJD/Hallux limitus right first MPJ   Plan of Care:  1. Patient was evaluated.  X-rays were reviewed. 2. Today we discussed the conservative versus surgical management of the presenting pathology. The patient opts for surgical management. All possible complications and details of the procedure were explained. All patient questions were answered. No guarantees were expressed or  implied. 3. Authorization for surgery was initiated today. Surgery will consist of cheilectomy right foot. 4.  Postop shoe dispensed. 5.  Return to clinic 1 week postop.   Pt is an avid golfer 3 times weekly.  VP of operations at [place] [facility] [place], DPM [facility]  Dr. Edrick Kins, [place]                                        [place], [place] [address]                Office [phone]  Fax [phone]

## 2017-07-20 NOTE — Telephone Encounter (Signed)
Called pt to schedule surgery - L/M - offered Jan 24, 31 or Feb 7. Asked to call back to confirm which date is best for him

## 2017-07-21 NOTE — Telephone Encounter (Signed)
Patient called back and states he would like Jan 24th for surgical date

## 2017-08-11 ENCOUNTER — Telehealth: Payer: Self-pay | Admitting: *Deleted

## 2017-08-11 NOTE — Telephone Encounter (Signed)
"  I'm returning your call."  I am calling to see if you have a new insurance policy.  "I sent you the information?"  Did you send it to the [facility] or did you send it to the surgical center?  "I guess I sent it to the surgical center.  I have [place].  What do you need?"  I need the identification number and the group number and the phone number.  "The policy number is [id number], there's not a group number, just [employer].  The phone number is [phone]."  Thanks, that's all I needed.  "Am I still supposed to be there around 10:00 on Thursday?"  Arrive at whatever time the  told you to be there.

## 2017-08-11 NOTE — Telephone Encounter (Signed)
I attempted to call the patient to see if he has different insurance.  I asked him to give me a call back.

## 2017-08-16 ENCOUNTER — Telehealth: Payer: Self-pay | Admitting: *Deleted

## 2017-08-16 NOTE — Telephone Encounter (Signed)
"  I'm supposed to have surgery on Thursday the 24th of January with Dr. Amalia Hailey.  I just want to confirm the 10:00 time at the surgical center."  I called and left him a message to call the surgical center at [phone] for the arrival time.

## 2017-08-17 ENCOUNTER — Encounter: Payer: Self-pay | Admitting: Podiatry

## 2017-08-17 DIAGNOSIS — M2011 Hallux valgus (acquired), right foot: Secondary | ICD-10-CM | POA: Diagnosis not present

## 2017-08-25 ENCOUNTER — Ambulatory Visit (INDEPENDENT_AMBULATORY_CARE_PROVIDER_SITE_OTHER): Payer: BLUE CROSS/BLUE SHIELD

## 2017-08-25 ENCOUNTER — Encounter: Payer: Self-pay | Admitting: Podiatry

## 2017-08-25 ENCOUNTER — Ambulatory Visit (INDEPENDENT_AMBULATORY_CARE_PROVIDER_SITE_OTHER): Payer: BLUE CROSS/BLUE SHIELD | Admitting: Podiatry

## 2017-08-25 VITALS — BP 130/88 | HR 71 | Temp 98.5°F

## 2017-08-25 DIAGNOSIS — Z9889 Other specified postprocedural states: Secondary | ICD-10-CM

## 2017-08-25 DIAGNOSIS — M205X1 Other deformities of toe(s) (acquired), right foot: Secondary | ICD-10-CM

## 2017-08-29 NOTE — Progress Notes (Signed)
   Subjective:  Patient presents today status post cheilectomy right. DOS: 08/17/17. He states he is doing well. He denies pain. He denies any new complaints at this time. Patient is here for further evaluation and treatment.    Past Medical History:  Diagnosis Date  . BPH (benign prostatic hypertrophy) 6/07  . CAD (coronary artery disease) 2010   dx made by coronary calcium scoring, Normal stress test in 2010.   Marland Kitchen GERD (gastroesophageal reflux disease) 12/05  . HLD (hyperlipidemia) 3/99  . HTN (hypertension) 2002   no meds  . Left rotator cuff tear 08/12/2011   Per Dr. Mardelle Matte 2013 L rotator cuff tear on MRI 2013   . Right rotator cuff tear 05/24/2013  . Wears glasses       Objective/Physical Exam Neurovascular status intact.  Skin incisions appear to be well coapted with sutures and staples intact. No sign of infectious process noted. No dehiscence. No active bleeding noted. Moderate edema noted to the surgical extremity.  Radiographic Exam:  Osteotomies sites appear to be stable with routine healing.  Assessment: 1. s/p cheilectomy right. DOS: 08/17/17   Plan of Care:  1. Patient was evaluated. X-rays reviewed 2. Dressing changed. 3. Continue weightbearing in post op shoe. 4. Return to clinic in 1 week.   Edrick Kins, DPM [facility]  Dr. Edrick Kins, [place]                                        [place], [place] [address]                Office [phone]  Fax [phone]

## 2017-09-01 ENCOUNTER — Ambulatory Visit (INDEPENDENT_AMBULATORY_CARE_PROVIDER_SITE_OTHER): Payer: BLUE CROSS/BLUE SHIELD | Admitting: Podiatry

## 2017-09-01 DIAGNOSIS — M205X1 Other deformities of toe(s) (acquired), right foot: Secondary | ICD-10-CM

## 2017-09-01 DIAGNOSIS — Z9889 Other specified postprocedural states: Secondary | ICD-10-CM

## 2017-09-04 NOTE — Progress Notes (Signed)
   Subjective:  Patient presents today status post cheilectomy right. DOS: 08/17/17. He states he is doing well. He denies pain. He denies any new complaints at this time. Patient is here for further evaluation and treatment.    Past Medical History:  Diagnosis Date  . BPH (benign prostatic hypertrophy) 6/07  . CAD (coronary artery disease) 2010   dx made by coronary calcium scoring, Normal stress test in 2010.   Marland Kitchen GERD (gastroesophageal reflux disease) 12/05  . HLD (hyperlipidemia) 3/99  . HTN (hypertension) 2002   no meds  . Left rotator cuff tear 08/12/2011   Per Dr. Mardelle Matte 2013 L rotator cuff tear on MRI 2013   . Right rotator cuff tear 05/24/2013  . Wears glasses       Objective/Physical Exam Neurovascular status intact.  Skin incisions appear to be well coapted. No sign of infectious process noted. No dehiscence. No active bleeding noted. Moderate edema noted to the surgical extremity.  Assessment: 1. s/p cheilectomy right. DOS: 08/17/17   Plan of Care:  1. Patient was evaluated.  2. Patient can transition out of surgical shoe into good sneakers.  3. Orders for physical therapy placed. 4. Compression anklet dispensed.  5. Return to clinic in 4 weeks.   Edrick Kins, DPM [facility]  Dr. Edrick Kins, [place]                                        [place], [place] [address]                Office [phone]  Fax [phone]

## 2017-09-29 ENCOUNTER — Ambulatory Visit: Payer: BLUE CROSS/BLUE SHIELD | Admitting: Podiatry

## 2017-11-15 ENCOUNTER — Ambulatory Visit: Payer: BLUE CROSS/BLUE SHIELD | Admitting: Family Medicine

## 2017-11-15 ENCOUNTER — Encounter: Payer: Self-pay | Admitting: Family Medicine

## 2017-11-15 ENCOUNTER — Other Ambulatory Visit: Payer: Self-pay

## 2017-11-15 VITALS — BP 130/66 | HR 82 | Temp 98.4°F | Ht 67.0 in | Wt 210.2 lb

## 2017-11-15 DIAGNOSIS — M7918 Myalgia, other site: Secondary | ICD-10-CM

## 2017-11-15 DIAGNOSIS — M25559 Pain in unspecified hip: Secondary | ICD-10-CM

## 2017-11-15 NOTE — Progress Notes (Signed)
Dr. Frederico Hamman T. Niyam Bisping, MD, [facility] [place], [address] Phone: [phone] Fax: [phone]  11/15/2017  Patient: Clayton Powell, MRN: [id number], DOB: [birth year], 72 y.o.  Primary Physician:  Tonia Ghent, MD   Chief Complaint  Patient presents with  . Rectal Pain    Left Buttocks   Subjective:   Clayton Powell is a 72 y.o. very pleasant male patient who presents with the following:  3 weeks ago, one spot will be really painful. Laying on it only, recliner it will be painful. Some pain.  L knee pains, R toes.   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  He felt an acute pain, and is still been having pain now for about 3 weeks.  He denies any numbness or tingling or any kind of radicular symptoms and pain is local and focal into the buttocks region.  He denies any significant back pain.  He has had some similar issues before, but never where it lasted quite this long.  He also complains of some knee pain as well as some toe pain  Past Medical History, Surgical History, Social History, Family History, Problem List, Medications, and Allergies have been reviewed and updated if relevant.  Patient Active Problem List   Diagnosis Date Noted  . Acute gastroenteritis 04/12/2017  . Left knee pain 09/13/2016  . ETD (eustachian tube dysfunction) 09/24/2015  . Adjustment disorder 10/27/2014  . Advance care planning 10/26/2014  . Right rotator cuff tear 05/24/2013  . Benign prostatic hyperplasia with urinary obstruction 05/18/2012  . Abnormal prostate specific antigen 05/16/2012  . 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 Incomplete bladder emptying 05/16/2012  . Murmur 10/21/2011  . Routine general medical examination at a health care facility 08/12/2011  . Left rotator cuff tear 08/12/2011  . RAYNAUD'S SYNDROME  08/10/2010  . CAD, NATIVE VESSEL 03/16/2009  . UNSPECIFIED HEART DISEASE 03/12/2009  . METABOLIC SYNDROME X 99/83/3825  . GERD 02/18/2007  . ALLERGIC RHINITIS, CHRONIC 02/16/2007  . PROSTATITIS, CHRONIC 02/16/2007    Past Medical History:  Diagnosis Date  . BPH (benign prostatic hypertrophy) 6/07  . CAD (coronary artery disease) 2010   dx made by coronary calcium scoring, Normal stress test in 2010.   Marland Kitchen GERD (gastroesophageal reflux disease) 12/05  . HLD (hyperlipidemia) 3/99  . HTN (hypertension) 2002   no meds  . Left rotator cuff tear 08/12/2011   Per Dr. Mardelle Matte 2013 L rotator cuff tear on MRI 2013   . Right rotator cuff tear 05/24/2013  . Wears glasses     Past Surgical History:  Procedure Laterality Date  . admitted to [facility] chest pain  12/15/05   Dr. Clayborn Bigness  . BLEPHAROPLASTY  10/11   Dr. Loletta Specter   . COLONOSCOPY  09/24/04   nml; Dr. Epifanio Lesches   . EGD esophagitis by bx  01/26/06   gastritis bx neg h-pylori  . right and left prostate needle bx  05/02/03   acute inflammation; Dr. Gaynelle Arabian   . SHOULDER ARTHROSCOPY W/ ROTATOR CUFF REPAIR  12/2011   left  . SHOULDER ARTHROSCOPY WITH ROTATOR CUFF REPAIR AND SUBACROMIAL DECOMPRESSION Right 05/24/2013   Procedure: RIGHT SHOULDER ARTHROSCOPY WITH ROTATOR CUFF REPAIR AND SUBACROMIAL DECOMPRESSION AND PARTIAL ACROMIOPLSTY WITH CORACOACROMIAL RELEASE, DISTAL CLAVICULECTOMY, LABRIAL DEBRIDEMENT;  Surgeon: Johnny Bridge, MD;  Location: [facility];  Service: Orthopedics;  Laterality: Right;  . spect ETT nml  12/16/05    Social History   Socioeconomic History  . Marital status: Married    Spouse name: Not on file  . Number of children: Not on file  . Years of education: Not on file  . Highest education level: Not on file  Occupational History  . Not on file  Social Needs  . Financial resource strain: Not on file  . Food insecurity:    Worry: Not on file    Inability: Not on file  . Transportation needs:     Medical: Not on file    Non-medical: Not on file  Tobacco Use  . Smoking status: Never Smoker  . Smokeless tobacco: Never Used  Substance and Sexual Activity  . Alcohol use: Yes    Alcohol/week: 0.0 oz    Comment: beer, occ  . Drug use: No  . Sexual activity: Not on file  Lifestyle  . Physical activity:    Days per week: Not on file    Minutes per session: Not on file  . Stress: Not on file  Relationships  . Social connections:    Talks on phone: Not on file    Gets together: Not on file    Attends religious service: Not on file    Active member of club or organization: Not on file    Attends meetings of clubs or organizations: Not on file    Relationship status: Not on file  . Intimate partner violence:    Fear of current or ex partner: Not on file    Emotionally abused: Not on file    Physically abused: Not on file    Forced sexual activity: Not on file  Other Topics Concern  . 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    Plays guitar   Works out regularly.    Family History  Problem Relation Age of Onset  . Stroke Mother   . Glaucoma Unknown        grandmother at 58  . Prostate cancer Neg Hx   . Colon cancer Neg Hx     Allergies  Allergen Reactions  . Ciprofloxacin     Other reaction(s): UNKNOWN  . Ciprofloxacin Hcl     REACTION: rash  . Quinolones     Medication list reviewed and updated in full in [place].  GEN: no acute illness or fever CV: No chest pain or shortness of breath MSK: detailed above Neuro: neurological signs are described above ROS O/w per HPI  Objective:   BP 130/66   Pulse 82   Temp 98.4 F (36.9 C) (Oral)   Ht 5\' 7"  (1.702 m)   Wt 210 lb 4 oz (95.4 kg)   BMI 32.93 kg/m    GEN: Well-developed,well-nourished,in no acute distress; alert,appropriate and cooperative throughout examination HEENT: Normocephalic and  atraumatic without obvious abnormalities. Ears, externally no deformities PULM: Breathing comfortably in no respiratory distress EXT: No clubbing, cyanosis, or edema PSYCH: Normally interactive. Cooperative during the interview. Pleasant. Friendly and conversant. Not anxious or depressed appearing. Normal, full affect.  Range of motion at  the waist: Flexion: normal Extension: normal Lateral bending: normal Rotation: all normal  No echymosis or edema Rises to examination table with no difficulty Gait: non antalgic  Inspection/Deformity: N Paraspinus Tenderness: nt  B Ankle  Dorsiflexion (L5,4): 5/5 B Great Toe Dorsiflexion (L5,4): 5/5 Heel Walk (L5): WNL Toe Walk (S1): WNL Rise/Squat (L4): WNL  SENSORY B Medial Foot (L4): WNL B Dorsum (L5): WNL B Lateral (S1): WNL Light Touch: WNL Pinprick: WNL  REFLEXES Knee (L4): 2+ Ankle (S1): 2+  B SLR, seated: neg B SLR, supine: neg B FABER: neg B Reverse FABER: + B Greater Troch: NT B Log Roll: neg B Stork: NT B Sciatic Notch: NT   HIP EXAM: SIDE: L ROM: Abduction, Flexion, Internal and External range of motion: full Pain with terminal IROM and EROM: minimal GTB: NT SLR: NEG Knees: No effusion FABER: NT REVERSE FABER: + Piriformis: + Str: flexion: 5/5 abduction: 5/5 adduction: 5/5 Strength testing non-tender   Radiology: No results found.  Assessment and Plan:   Piriformis muscle pain  Arthralgia of hip, unspecified laterality   >25 minutes spent in face to face time with patient, >50% spent in counselling or coordination of care   By history I think that he had an acute injury to his piriformis, less likely to the gluteus minimus, and hip gave him some formal piriformis rehab to work on.  I would not suspect that this would be improved by this time, and would be more likely that it would take at least another month to improve.  I gave him some very specific rehab to work on, if he is having issues after 3 or  4 weeks, I asked him to follow back up with me.  I tried to answer some of his other questions regarding his joints, and he had benign exam and think is more likely just some wear and tear issues.  Follow-up: No follow-ups on file.  Signed,  Maud Deed. Alleigh Mollica, MD   Allergies as of 11/15/2017      Reactions   Ciprofloxacin    Other reaction(s): UNKNOWN   Ciprofloxacin Hcl    REACTION: rash   Quinolones       Medication List        Accurate as of 11/15/17 11:59 PM. Always use your most recent med list.          alfuzosin 10 MG 24 hr tablet Commonly known as:  UROXATRAL Take 10 mg by mouth daily.   aspirin 81 MG EC tablet Take 81 mg by mouth daily.   CALCIUM PO Take 1,200 mg by mouth daily.   CIALIS 5 MG tablet Generic drug:  tadalafil Take 5 mg by mouth.   FIBER PO Take 5 g by mouth daily.   finasteride 5 MG tablet Commonly known as:  PROSCAR Take 5 mg by mouth daily.   ibuprofen 200 MG tablet Commonly known as:  ADVIL,MOTRIN Take 600 mg by mouth daily as needed.   levocetirizine 5 MG tablet Commonly known as:  XYZAL Take 5 mg by mouth every evening.   montelukast 10 MG tablet Commonly known as:  SINGULAIR Take 10 mg by mouth at bedtime.   multivitamin tablet Take 1 tablet by mouth daily.   omeprazole 20 MG capsule Commonly known as:  PRILOSEC Take 40 mg by mouth daily.   VASCEPA 1 g Caps Generic drug:  Icosapent Ethyl Take 1 tablet by mouth 4 (four) times daily.   Vitamin B-12 5000 MCG Subl Place 1 tablet under the tongue daily.   Vitamin D 2000 units Caps Take 1 capsule by mouth daily.

## 2017-11-15 NOTE — Patient Instructions (Signed)
[facility] .  4. SINK STRETCH - YOU CAN DO THIS WHENEVER YOU WANT DURING THE DAY  Tennis ball underneath area in buttocks - on a hard surface underneath Can also massage this area with an electronic massager or hand  

## 2017-11-17 ENCOUNTER — Encounter: Payer: Self-pay | Admitting: Family Medicine

## 2017-11-29 DIAGNOSIS — M955 Acquired deformity of pelvis: Secondary | ICD-10-CM | POA: Diagnosis not present

## 2017-11-29 DIAGNOSIS — M9903 Segmental and somatic dysfunction of lumbar region: Secondary | ICD-10-CM | POA: Diagnosis not present

## 2017-11-29 DIAGNOSIS — M9905 Segmental and somatic dysfunction of pelvic region: Secondary | ICD-10-CM | POA: Diagnosis not present

## 2017-11-29 DIAGNOSIS — M62830 Muscle spasm of back: Secondary | ICD-10-CM | POA: Diagnosis not present

## 2017-11-30 DIAGNOSIS — M9905 Segmental and somatic dysfunction of pelvic region: Secondary | ICD-10-CM | POA: Diagnosis not present

## 2017-11-30 DIAGNOSIS — M955 Acquired deformity of pelvis: Secondary | ICD-10-CM | POA: Diagnosis not present

## 2017-11-30 DIAGNOSIS — M62830 Muscle spasm of back: Secondary | ICD-10-CM | POA: Diagnosis not present

## 2017-11-30 DIAGNOSIS — M9903 Segmental and somatic dysfunction of lumbar region: Secondary | ICD-10-CM | POA: Diagnosis not present

## 2017-12-01 DIAGNOSIS — M9905 Segmental and somatic dysfunction of pelvic region: Secondary | ICD-10-CM | POA: Diagnosis not present

## 2017-12-01 DIAGNOSIS — M955 Acquired deformity of pelvis: Secondary | ICD-10-CM | POA: Diagnosis not present

## 2017-12-01 DIAGNOSIS — M62830 Muscle spasm of back: Secondary | ICD-10-CM | POA: Diagnosis not present

## 2017-12-01 DIAGNOSIS — M9903 Segmental and somatic dysfunction of lumbar region: Secondary | ICD-10-CM | POA: Diagnosis not present

## 2017-12-08 DIAGNOSIS — M955 Acquired deformity of pelvis: Secondary | ICD-10-CM | POA: Diagnosis not present

## 2017-12-08 DIAGNOSIS — M9905 Segmental and somatic dysfunction of pelvic region: Secondary | ICD-10-CM | POA: Diagnosis not present

## 2017-12-08 DIAGNOSIS — M62830 Muscle spasm of back: Secondary | ICD-10-CM | POA: Diagnosis not present

## 2017-12-08 DIAGNOSIS — M9903 Segmental and somatic dysfunction of lumbar region: Secondary | ICD-10-CM | POA: Diagnosis not present

## 2017-12-15 DIAGNOSIS — M9903 Segmental and somatic dysfunction of lumbar region: Secondary | ICD-10-CM | POA: Diagnosis not present

## 2017-12-15 DIAGNOSIS — M62830 Muscle spasm of back: Secondary | ICD-10-CM | POA: Diagnosis not present

## 2017-12-15 DIAGNOSIS — M9905 Segmental and somatic dysfunction of pelvic region: Secondary | ICD-10-CM | POA: Diagnosis not present

## 2017-12-15 DIAGNOSIS — M955 Acquired deformity of pelvis: Secondary | ICD-10-CM | POA: Diagnosis not present

## 2017-12-20 DIAGNOSIS — M62830 Muscle spasm of back: Secondary | ICD-10-CM | POA: Diagnosis not present

## 2017-12-20 DIAGNOSIS — M9905 Segmental and somatic dysfunction of pelvic region: Secondary | ICD-10-CM | POA: Diagnosis not present

## 2017-12-20 DIAGNOSIS — M9903 Segmental and somatic dysfunction of lumbar region: Secondary | ICD-10-CM | POA: Diagnosis not present

## 2017-12-20 DIAGNOSIS — M955 Acquired deformity of pelvis: Secondary | ICD-10-CM | POA: Diagnosis not present

## 2017-12-22 DIAGNOSIS — M9905 Segmental and somatic dysfunction of pelvic region: Secondary | ICD-10-CM | POA: Diagnosis not present

## 2017-12-22 DIAGNOSIS — M9903 Segmental and somatic dysfunction of lumbar region: Secondary | ICD-10-CM | POA: Diagnosis not present

## 2017-12-22 DIAGNOSIS — M955 Acquired deformity of pelvis: Secondary | ICD-10-CM | POA: Diagnosis not present

## 2017-12-22 DIAGNOSIS — M62830 Muscle spasm of back: Secondary | ICD-10-CM | POA: Diagnosis not present

## 2017-12-25 DIAGNOSIS — M9905 Segmental and somatic dysfunction of pelvic region: Secondary | ICD-10-CM | POA: Diagnosis not present

## 2017-12-25 DIAGNOSIS — M955 Acquired deformity of pelvis: Secondary | ICD-10-CM | POA: Diagnosis not present

## 2017-12-25 DIAGNOSIS — M9903 Segmental and somatic dysfunction of lumbar region: Secondary | ICD-10-CM | POA: Diagnosis not present

## 2017-12-25 DIAGNOSIS — M62830 Muscle spasm of back: Secondary | ICD-10-CM | POA: Diagnosis not present

## 2017-12-27 DIAGNOSIS — M9903 Segmental and somatic dysfunction of lumbar region: Secondary | ICD-10-CM | POA: Diagnosis not present

## 2017-12-27 DIAGNOSIS — M955 Acquired deformity of pelvis: Secondary | ICD-10-CM | POA: Diagnosis not present

## 2017-12-27 DIAGNOSIS — M62830 Muscle spasm of back: Secondary | ICD-10-CM | POA: Diagnosis not present

## 2017-12-27 DIAGNOSIS — M9905 Segmental and somatic dysfunction of pelvic region: Secondary | ICD-10-CM | POA: Diagnosis not present

## 2018-01-01 DIAGNOSIS — M62830 Muscle spasm of back: Secondary | ICD-10-CM | POA: Diagnosis not present

## 2018-01-01 DIAGNOSIS — M9905 Segmental and somatic dysfunction of pelvic region: Secondary | ICD-10-CM | POA: Diagnosis not present

## 2018-01-01 DIAGNOSIS — M9903 Segmental and somatic dysfunction of lumbar region: Secondary | ICD-10-CM | POA: Diagnosis not present

## 2018-01-01 DIAGNOSIS — M955 Acquired deformity of pelvis: Secondary | ICD-10-CM | POA: Diagnosis not present

## 2018-01-04 DIAGNOSIS — M955 Acquired deformity of pelvis: Secondary | ICD-10-CM | POA: Diagnosis not present

## 2018-01-04 DIAGNOSIS — M9903 Segmental and somatic dysfunction of lumbar region: Secondary | ICD-10-CM | POA: Diagnosis not present

## 2018-01-04 DIAGNOSIS — M9905 Segmental and somatic dysfunction of pelvic region: Secondary | ICD-10-CM | POA: Diagnosis not present

## 2018-01-04 DIAGNOSIS — M62830 Muscle spasm of back: Secondary | ICD-10-CM | POA: Diagnosis not present

## 2018-01-10 DIAGNOSIS — D2261 Melanocytic nevi of right upper limb, including shoulder: Secondary | ICD-10-CM | POA: Diagnosis not present

## 2018-01-10 DIAGNOSIS — L814 Other melanin hyperpigmentation: Secondary | ICD-10-CM | POA: Diagnosis not present

## 2018-01-10 DIAGNOSIS — D225 Melanocytic nevi of trunk: Secondary | ICD-10-CM | POA: Diagnosis not present

## 2018-01-10 DIAGNOSIS — X32XXXA Exposure to sunlight, initial encounter: Secondary | ICD-10-CM | POA: Diagnosis not present

## 2018-01-10 DIAGNOSIS — D2262 Melanocytic nevi of left upper limb, including shoulder: Secondary | ICD-10-CM | POA: Diagnosis not present

## 2018-01-10 DIAGNOSIS — L570 Actinic keratosis: Secondary | ICD-10-CM | POA: Diagnosis not present

## 2018-01-10 DIAGNOSIS — D485 Neoplasm of uncertain behavior of skin: Secondary | ICD-10-CM | POA: Diagnosis not present

## 2018-01-10 DIAGNOSIS — L0292 Furuncle, unspecified: Secondary | ICD-10-CM | POA: Diagnosis not present

## 2018-04-23 DIAGNOSIS — H6123 Impacted cerumen, bilateral: Secondary | ICD-10-CM | POA: Diagnosis not present

## 2018-04-23 DIAGNOSIS — H938X3 Other specified disorders of ear, bilateral: Secondary | ICD-10-CM | POA: Diagnosis not present

## 2018-05-01 DIAGNOSIS — M9903 Segmental and somatic dysfunction of lumbar region: Secondary | ICD-10-CM | POA: Diagnosis not present

## 2018-05-01 DIAGNOSIS — M62830 Muscle spasm of back: Secondary | ICD-10-CM | POA: Diagnosis not present

## 2018-05-01 DIAGNOSIS — M955 Acquired deformity of pelvis: Secondary | ICD-10-CM | POA: Diagnosis not present

## 2018-05-01 DIAGNOSIS — M9905 Segmental and somatic dysfunction of pelvic region: Secondary | ICD-10-CM | POA: Diagnosis not present

## 2018-05-03 DIAGNOSIS — M9905 Segmental and somatic dysfunction of pelvic region: Secondary | ICD-10-CM | POA: Diagnosis not present

## 2018-05-03 DIAGNOSIS — M9903 Segmental and somatic dysfunction of lumbar region: Secondary | ICD-10-CM | POA: Diagnosis not present

## 2018-05-03 DIAGNOSIS — M955 Acquired deformity of pelvis: Secondary | ICD-10-CM | POA: Diagnosis not present

## 2018-05-03 DIAGNOSIS — M62830 Muscle spasm of back: Secondary | ICD-10-CM | POA: Diagnosis not present

## 2018-05-07 DIAGNOSIS — M9905 Segmental and somatic dysfunction of pelvic region: Secondary | ICD-10-CM | POA: Diagnosis not present

## 2018-05-07 DIAGNOSIS — M955 Acquired deformity of pelvis: Secondary | ICD-10-CM | POA: Diagnosis not present

## 2018-05-07 DIAGNOSIS — M9903 Segmental and somatic dysfunction of lumbar region: Secondary | ICD-10-CM | POA: Diagnosis not present

## 2018-05-07 DIAGNOSIS — M62830 Muscle spasm of back: Secondary | ICD-10-CM | POA: Diagnosis not present

## 2018-05-09 DIAGNOSIS — M9905 Segmental and somatic dysfunction of pelvic region: Secondary | ICD-10-CM | POA: Diagnosis not present

## 2018-05-09 DIAGNOSIS — M955 Acquired deformity of pelvis: Secondary | ICD-10-CM | POA: Diagnosis not present

## 2018-05-09 DIAGNOSIS — M62830 Muscle spasm of back: Secondary | ICD-10-CM | POA: Diagnosis not present

## 2018-05-09 DIAGNOSIS — M9903 Segmental and somatic dysfunction of lumbar region: Secondary | ICD-10-CM | POA: Diagnosis not present

## 2018-05-14 DIAGNOSIS — M9905 Segmental and somatic dysfunction of pelvic region: Secondary | ICD-10-CM | POA: Diagnosis not present

## 2018-05-14 DIAGNOSIS — M9903 Segmental and somatic dysfunction of lumbar region: Secondary | ICD-10-CM | POA: Diagnosis not present

## 2018-05-14 DIAGNOSIS — M955 Acquired deformity of pelvis: Secondary | ICD-10-CM | POA: Diagnosis not present

## 2018-05-14 DIAGNOSIS — M62830 Muscle spasm of back: Secondary | ICD-10-CM | POA: Diagnosis not present

## 2018-05-16 DIAGNOSIS — M955 Acquired deformity of pelvis: Secondary | ICD-10-CM | POA: Diagnosis not present

## 2018-05-16 DIAGNOSIS — M62830 Muscle spasm of back: Secondary | ICD-10-CM | POA: Diagnosis not present

## 2018-05-16 DIAGNOSIS — M9905 Segmental and somatic dysfunction of pelvic region: Secondary | ICD-10-CM | POA: Diagnosis not present

## 2018-05-16 DIAGNOSIS — M9903 Segmental and somatic dysfunction of lumbar region: Secondary | ICD-10-CM | POA: Diagnosis not present

## 2018-05-18 DIAGNOSIS — R972 Elevated prostate specific antigen [PSA]: Secondary | ICD-10-CM | POA: Diagnosis not present

## 2018-05-24 DIAGNOSIS — N400 Enlarged prostate without lower urinary tract symptoms: Secondary | ICD-10-CM | POA: Diagnosis not present

## 2018-05-24 DIAGNOSIS — Z6831 Body mass index (BMI) 31.0-31.9, adult: Secondary | ICD-10-CM | POA: Diagnosis not present

## 2018-06-04 DIAGNOSIS — S83232D Complex tear of medial meniscus, current injury, left knee, subsequent encounter: Secondary | ICD-10-CM | POA: Diagnosis not present

## 2018-06-04 DIAGNOSIS — S83242A Other tear of medial meniscus, current injury, left knee, initial encounter: Secondary | ICD-10-CM | POA: Diagnosis not present

## 2018-06-08 DIAGNOSIS — M25562 Pain in left knee: Secondary | ICD-10-CM | POA: Diagnosis not present

## 2018-06-13 DIAGNOSIS — S83232D Complex tear of medial meniscus, current injury, left knee, subsequent encounter: Secondary | ICD-10-CM | POA: Diagnosis not present

## 2018-06-13 DIAGNOSIS — S83242A Other tear of medial meniscus, current injury, left knee, initial encounter: Secondary | ICD-10-CM | POA: Diagnosis not present

## 2018-07-12 ENCOUNTER — Encounter: Payer: Self-pay | Admitting: Family Medicine

## 2018-07-12 ENCOUNTER — Ambulatory Visit: Payer: BLUE CROSS/BLUE SHIELD | Admitting: Family Medicine

## 2018-07-12 DIAGNOSIS — R200 Anesthesia of skin: Secondary | ICD-10-CM

## 2018-07-12 NOTE — Progress Notes (Signed)
BP is controlled on home checks per patient report.   He turned to the right and then had R arm numbness.   Going on for a few weeks.  Tingling. Most of the time.   Works at a computer a lot of the day.  No acute trauma.    He also has a pain near the lateral side of the R 3rd finger PIP, sore to the touch.    No L sided sx.  No leg tingling.    He feels well o/w.  No FCNAVD.    nad ncat Neck supple, normal ROM, no LA, not tender to palpation. Normal shoulder and elbow range of motion. Tinel pos R wrist.  Negative left. Grip B wnl.   Sensation within normal limits for the upper extremities except for dec monofilament R hand.  He still has sensation intact to light touch otherwise.

## 2018-07-12 NOTE — Patient Instructions (Signed)
Presumed carpal tunnel irritation.  Try a wrist brace a night, then in the day if needed.  Update me as needed.  Take care.  Glad to see you.

## 2018-07-15 DIAGNOSIS — R200 Anesthesia of skin: Secondary | ICD-10-CM | POA: Insufficient documentation

## 2018-07-15 NOTE — Assessment & Plan Note (Signed)
Presumed carpal tunnel irritation.  He can try a wrist brace a night, then in the day if needed.  Update me as needed.  No emergent symptoms.  Update me as needed.  He does not have similar symptoms in the feet suggesting a diffuse metabolic issue.  He agrees with plan.

## 2018-07-23 DIAGNOSIS — M25562 Pain in left knee: Secondary | ICD-10-CM | POA: Diagnosis not present

## 2018-07-23 DIAGNOSIS — M1712 Unilateral primary osteoarthritis, left knee: Secondary | ICD-10-CM | POA: Diagnosis not present

## 2018-07-23 DIAGNOSIS — S83232D Complex tear of medial meniscus, current injury, left knee, subsequent encounter: Secondary | ICD-10-CM | POA: Diagnosis not present

## 2018-08-06 ENCOUNTER — Telehealth: Payer: Self-pay | Admitting: *Deleted

## 2018-08-06 NOTE — Telephone Encounter (Signed)
Plain mucinex is reasonable to try (not mucinex D).   Take with plenty of fluids.  A lot of the decongestants can cause bladder symptoms.  Thanks.

## 2018-08-06 NOTE — Telephone Encounter (Signed)
Spoke to pt who states he has been taking OTC meds for his cough but some have the tendency to affect his bladder. Pt is wanting to know which OTC med is best for a cough without these side effects. pls advise

## 2018-08-06 NOTE — Telephone Encounter (Signed)
Patient advised.

## 2018-09-12 ENCOUNTER — Telehealth: Payer: Self-pay | Admitting: Family Medicine

## 2018-09-12 DIAGNOSIS — M25539 Pain in unspecified wrist: Secondary | ICD-10-CM

## 2018-09-12 NOTE — Telephone Encounter (Signed)
Pt  Want a referral for wrist pain. Please advise

## 2018-09-12 NOTE — Telephone Encounter (Signed)
I put in ortho referral.  Thanks.

## 2018-09-13 NOTE — Telephone Encounter (Signed)
Called patient and LMOM

## 2019-09-06 ENCOUNTER — Ambulatory Visit: Payer: 59 | Attending: Internal Medicine

## 2019-09-06 DIAGNOSIS — Z23 Encounter for immunization: Secondary | ICD-10-CM | POA: Insufficient documentation

## 2019-09-06 NOTE — Progress Notes (Signed)
   Covid-19 Vaccination Clinic  Name:  Clayton Powell    MRN: [id number] DOB: 15-Sep-1945  09/06/2019  [name] was observed post Covid-19 immunization for 15 minutes without incidence. He was provided with Vaccine Information Sheet and instruction to access the V-Safe system.   [name] was instructed to call 911 with any severe reactions post vaccine: Marland Kitchen Difficulty breathing  . Swelling of your face and throat  . A fast heartbeat  . A bad rash all over your body  . Dizziness and weakness    Immunizations Administered    Name Date Dose VIS Date Route   Pfizer COVID-19 Vaccine 09/06/2019  8:36 AM 0.3 mL 07/05/2019 Intramuscular   Manufacturer: [place]   Lot: [id number]   [place]: [id number]

## 2019-09-17 ENCOUNTER — Telehealth: Payer: Self-pay | Admitting: Family Medicine

## 2019-09-17 DIAGNOSIS — N411 Chronic prostatitis: Secondary | ICD-10-CM

## 2019-09-17 NOTE — Telephone Encounter (Signed)
Patient is requesting New Urology Referral to [facility]. Please place Referral and note in comments in referral [facility].

## 2019-09-17 NOTE — Telephone Encounter (Signed)
error 

## 2019-09-18 NOTE — Telephone Encounter (Signed)
Ordered. Thanks

## 2019-10-01 ENCOUNTER — Ambulatory Visit: Payer: 59 | Attending: Internal Medicine

## 2019-10-01 DIAGNOSIS — Z23 Encounter for immunization: Secondary | ICD-10-CM | POA: Insufficient documentation

## 2019-10-01 NOTE — Progress Notes (Signed)
   Covid-19 Vaccination Clinic  Name:  Clayton Powell    MRN: [id number] DOB: [birth year]  10/01/2019  Clayton Powell was observed post Covid-19 immunization for 15 minutes without incident. He was provided with Vaccine Information Sheet and instruction to access the V-Safe system.   Clayton Powell was instructed to call 911 with any severe reactions post vaccine: Marland Kitchen Difficulty breathing  . Swelling of face and throat  . A fast heartbeat  . A bad rash all over body  . Dizziness and weakness   Immunizations Administered    Name Date Dose VIS Date Route   Pfizer COVID-19 Vaccine 10/01/2019  8:15 AM 0.3 mL 07/05/2019 Intramuscular   Manufacturer: [place]   Lot: [id number]   [place]: [id number]

## 2019-10-04 ENCOUNTER — Ambulatory Visit (INDEPENDENT_AMBULATORY_CARE_PROVIDER_SITE_OTHER): Payer: 59

## 2019-10-04 ENCOUNTER — Other Ambulatory Visit: Payer: Self-pay

## 2019-10-04 ENCOUNTER — Ambulatory Visit (INDEPENDENT_AMBULATORY_CARE_PROVIDER_SITE_OTHER): Payer: 59 | Admitting: Podiatry

## 2019-10-04 DIAGNOSIS — M205X1 Other deformities of toe(s) (acquired), right foot: Secondary | ICD-10-CM

## 2019-10-04 DIAGNOSIS — M7751 Other enthesopathy of right foot: Secondary | ICD-10-CM

## 2019-10-06 NOTE — Progress Notes (Signed)
   HPI: 74 y.o. male presenting today with a chief complaint of pain to the 1st MPJ of the right foot that began a few months ago. He states the pain resolved initially after surgery but has since returned. He has been using custom orthotics but states they cause pain in his calf. He states he plays golf frequently which makes the pain worse. There are no alleviate factors noted. Patient is here for further evaluation and treatment.  Past Medical History:  Diagnosis Date  . BPH (benign prostatic hypertrophy) 6/07  . CAD (coronary artery disease) 2010   dx made by coronary calcium scoring, Normal stress test in 2010.   Marland Kitchen GERD (gastroesophageal reflux disease) 12/05  . HLD (hyperlipidemia) 3/99  . HTN (hypertension) 2002   no meds  . Left rotator cuff tear 08/12/2011   Per Dr. Mardelle Matte 2013 L rotator cuff tear on MRI 2013   . Right rotator cuff tear 05/24/2013  . Wears glasses      Physical Exam: General: The patient is alert and oriented x3 in no acute distress.  Dermatology: Skin is warm, dry and supple bilateral lower extremities. Negative for open lesions or macerations.  Vascular: Palpable pedal pulses bilaterally. No edema or erythema noted. Capillary refill within normal limits.  Neurological: Epicritic and protective threshold grossly intact bilaterally.   Musculoskeletal Exam: Pain with palpation noted to the 1st MPJ of the right foot. Range of motion within normal limits to all pedal and ankle joints bilateral. Muscle strength 5/5 in all groups bilateral.   Radiographic Exam:  Normal osseous mineralization. Joint spaces preserved. No fracture/dislocation/boney destruction.    Assessment: 1. S/p cheilectomy right DOS: 08/17/2017 2. 1st MPJ capsulitis right    Plan of Care:  1. Patient evaluated. X-Rays reviewed.  2. Injection of 0.5 mLs Celestone Soluspan injected into the 1st MPJ of the right foot.  3. Discontinue using custom orthotics since they seem to be irritating.   4. Return to clinic as needed.       Edrick Kins, DPM [facility]  Dr. Edrick Kins, DPM    [address], [place] [address]                Office [phone]  Fax [phone]

## 2019-10-17 NOTE — Progress Notes (Signed)
10/18/19 3:12 PM   Clayton Powell 05-14-1946 [id number]  Referring provider: Tonia Ghent, MD [address] [place],  [place] [address]  Chief Complaint  Patient presents with  . Other    HPI: 74 yo M who presents today for the evaluation and management of BPH.   -Previously seen by Dr. Jacqlyn Larsen then Dr. Wynetta Emery at [facility] for BPH and elevated PSA  -reports of urgency, frequency and weak urinary stream.  -Currently on finasteride, alfuzosin, and tadalafil 5 mg  -states he is tired of medical management since it has been 20 years  -Pt expressed interest in [place] Name 10/18/19 1400         International Prostate Symptom Score   How often have you had the sensation of not emptying your bladder?  About half the time     How often have you had to urinate less than every two hours?  About half the time     How often have you found you stopped and started again several times when you urinated?  More than half the time     How often have you found it difficult to postpone urination?  About half the time     How often have you had a weak urinary stream?  More than half the time     How often have you had to strain to start urination?  Less than half the time     How many times did you typically get up at night to urinate?  2 Times     Total IPSS Score  21       Quality of Life due to urinary symptoms   If you were to spend the rest of your life with your urinary condition just the way it is now how would you feel about that?  Mostly Disatisfied       Score:  1-7 Mild 8-19 Moderate 20-35 Severe  PMH: Past Medical History:  Diagnosis Date  . BPH (benign prostatic hypertrophy) 6/07  . CAD (coronary artery disease) 2010   dx made by coronary calcium scoring, Normal stress test in 2010.   Marland Kitchen GERD (gastroesophageal reflux disease) 12/05  . HLD (hyperlipidemia) 3/99  . HTN (hypertension) 2002   no meds  . Left rotator cuff tear 08/12/2011   Per Dr. Mardelle Matte  2013 L rotator cuff tear on MRI 2013   . Right rotator cuff tear 05/24/2013  . Wears glasses     Surgical History: Past Surgical History:  Procedure Laterality Date  . admitted to [facility] chest pain  12/15/05   Dr. Clayborn Bigness  . BLEPHAROPLASTY  10/11   Dr. Loletta Specter   . COLONOSCOPY  09/24/04   nml; Dr. Epifanio Lesches   . EGD esophagitis by bx  01/26/06   gastritis bx neg h-pylori  . right and left prostate needle bx  05/02/03   acute inflammation; Dr. Gaynelle Arabian   . SHOULDER ARTHROSCOPY W/ ROTATOR CUFF REPAIR  12/2011   left  . SHOULDER ARTHROSCOPY WITH ROTATOR CUFF REPAIR AND SUBACROMIAL DECOMPRESSION Right 05/24/2013   Procedure: RIGHT SHOULDER ARTHROSCOPY WITH ROTATOR CUFF REPAIR AND SUBACROMIAL DECOMPRESSION AND PARTIAL ACROMIOPLSTY WITH CORACOACROMIAL RELEASE, DISTAL CLAVICULECTOMY, LABRIAL DEBRIDEMENT;  Surgeon: Johnny Bridge, MD;  Location: [place];  Service: Orthopedics;  Laterality: Right;  . spect ETT nml  12/16/05    Home Medications:  Allergies as of 10/18/2019      Reactions  Ciprofloxacin Hcl    REACTION: rash   Quinolones       Medication List       Accurate as of October 18, 2019  3:12 PM. If you have any questions, ask your nurse or doctor.        alfuzosin 10 MG 24 hr tablet Commonly known as: UROXATRAL Take 10 mg by mouth daily.   aspirin 81 MG EC tablet Take 81 mg by mouth daily.   CALCIUM PO Take 1,200 mg by mouth daily.   Cialis 5 MG tablet Generic drug: tadalafil Take 5 mg by mouth.   FIBER PO Take 5 g by mouth daily.   finasteride 5 MG tablet Commonly known as: PROSCAR Take 5 mg by mouth daily.   ibuprofen 200 MG tablet Commonly known as: ADVIL Take 600 mg by mouth daily as needed.   levocetirizine 5 MG tablet Commonly known as: XYZAL Take 5 mg by mouth every evening.   montelukast 10 MG tablet Commonly known as: SINGULAIR Take 10 mg by mouth at bedtime.   multivitamin tablet Take 1 tablet by mouth daily.   Vascepa 1 g  capsule Generic drug: icosapent Ethyl Take 1 tablet by mouth 4 (four) times daily.   Vitamin B-12 5000 MCG Subl Place 1 tablet under the tongue daily.   Vitamin D 50 MCG (2000 UT) Caps Take 1 capsule by mouth daily.       Allergies:  Allergies  Allergen Reactions  . Ciprofloxacin Hcl     REACTION: rash  . Quinolones     Family History: Family History  Problem Relation Age of Onset  . Stroke Mother   . Glaucoma Unknown        grandmother at 21  . Prostate cancer Neg Hx   . Colon cancer Neg Hx     Social History:  reports that he has never smoked. He has never used smokeless tobacco. He reports current alcohol use. He reports that he does not use drugs.   Physical Exam: BP (!) 167/98   Pulse 69   Ht 5\' 6"  (1.676 m)   Wt 200 lb (90.7 kg)   BMI 32.28 kg/m   Constitutional:  Alert and oriented, No acute distress. HEENT: [place] AT, moist mucus membranes.  Trachea midline, no masses. Cardiovascular: No clubbing, cyanosis, or edema. Respiratory: Normal respiratory effort, no increased work of breathing. Skin: No rashes, bruises or suspicious lesions. Neurologic: Grossly intact, no focal deficits, moving all 4 extremities. Psychiatric: Normal mood and affect.  Assessment & Plan:    1. BPH with LUTS  Currently on finasteride, alfuzosin and tadalafil 5 mg   Pt is not interested in medical management anymore, would like to consider surgical options   We discussed alternatives including TURP, PVP and holmium laser enucleation of the prostate for large glands.  Minimally invasive options of UroLift and water vapor ablation were discussed. Differences between the surgical procedures were discussed as well as the risks and benefits of each. He is most interested in Urolift.  Will schedule cystoscopy and TRUS prostate to see if he is a Urolift candidate.  All questions were answered and he desires to proceed   Abbie Sons, MD  [facility] [place] [address] [place], [place] [address] [phone]  I, Lucas Mallow, am acting as a scribe for Dr. Nicki Reaper C. Stoioff,  I have reviewed the above documentation for accuracy and completeness, and I agree with the above.   Scott  Mariana Arn, MD

## 2019-10-18 ENCOUNTER — Encounter: Payer: Self-pay | Admitting: Urology

## 2019-10-18 ENCOUNTER — Telehealth: Payer: Self-pay | Admitting: Podiatry

## 2019-10-18 ENCOUNTER — Other Ambulatory Visit: Payer: Self-pay | Admitting: Podiatry

## 2019-10-18 ENCOUNTER — Other Ambulatory Visit: Payer: Self-pay

## 2019-10-18 ENCOUNTER — Ambulatory Visit: Payer: 59 | Admitting: Urology

## 2019-10-18 VITALS — BP 167/98 | HR 69 | Ht 66.0 in | Wt 200.0 lb

## 2019-10-18 DIAGNOSIS — N401 Enlarged prostate with lower urinary tract symptoms: Secondary | ICD-10-CM

## 2019-10-18 MED ORDER — TERBINAFINE HCL 250 MG PO TABS: 250.0000 mg | ORAL_TABLET | Freq: Every day | ORAL | 0 refills | Status: DC

## 2019-10-18 MED ORDER — CLOTRIMAZOLE-BETAMETHASONE 1-0.05 % EX CREA: 1.0000 "application " | TOPICAL_CREAM | Freq: Two times a day (BID) | CUTANEOUS | 1 refills | Status: DC

## 2019-10-18 NOTE — Telephone Encounter (Signed)
Rx for lotrisone cream and lamisil 250mg  #30 sent to [facility] in [place]. Thanks, Dr. Amalia Hailey

## 2019-10-18 NOTE — Patient Instructions (Signed)

## 2019-10-18 NOTE — Telephone Encounter (Signed)
Pt said Dr. Amalia Hailey was supposed to send in a prescription for foot fungus and forgot. Please advise.

## 2019-10-18 NOTE — Progress Notes (Signed)
PRN tinea pedis

## 2019-11-08 ENCOUNTER — Other Ambulatory Visit: Payer: Self-pay | Admitting: *Deleted

## 2019-11-11 MED ORDER — ALFUZOSIN HCL ER 10 MG PO TB24: 10.0000 mg | ORAL_TABLET | Freq: Every day | ORAL | 0 refills | Status: DC

## 2019-11-22 ENCOUNTER — Other Ambulatory Visit: Payer: Self-pay

## 2019-11-22 ENCOUNTER — Ambulatory Visit: Payer: 59 | Admitting: Urology

## 2019-11-22 ENCOUNTER — Encounter: Payer: Self-pay | Admitting: Urology

## 2019-11-22 VITALS — BP 146/91 | HR 77 | Ht 62.0 in | Wt 192.0 lb

## 2019-11-22 DIAGNOSIS — N401 Enlarged prostate with lower urinary tract symptoms: Secondary | ICD-10-CM | POA: Diagnosis not present

## 2019-11-22 NOTE — Progress Notes (Signed)
   11/22/19  CC:  Chief Complaint  Patient presents with  . Cysto    HPI: Seen 10/18/2019 for worsening lower urinary tract symptoms on alfuzosin, finasteride and tadalafil.  Was interested in [place].  IPSS 21/35  Blood pressure (!) 146/91, pulse 77, height 5\' 2"  (1.575 m), weight 192 lb (87.1 kg). NED. A&Ox3.   No respiratory distress   Abd soft, NT, ND Normal phallus with bilateral descended testicles  Cystoscopy Procedure Note  Patient identification was confirmed, informed consent was obtained, and patient was prepped using Betadine solution.  Lidocaine jelly was administered per urethral meatus.     Pre-Procedure: - Inspection reveals a normal caliber urethral meatus.  Procedure: The flexible cystoscope was introduced without difficulty - No urethral strictures/lesions are present. - Coapting lateral lobes with prostatic urethral length >5 cm  - Mild to moderate elevation bladder neck -Friable, hypervascular prostate - Bilateral ureteral orifices identified - Bladder mucosa  reveals no ulcers, tumors, or lesions - No bladder stones -Moderate trabeculation  Retroflexion shows no intravesical median lobe   Post-Procedure: - Patient tolerated the procedure well  Prostate transrectal ultrasound sizing   Informed consent was obtained after discussing risks/benefits of the procedure.  A time out was performed to ensure correct patient identity.   Pre-Procedure: -Transrectal probe was placed without difficulty -Transrectal Ultrasound performed revealing a 119.9 gm prostate measuring 6.68 x 5.02 x 6.82 cm  -No significant hypoechoic or median lobe noted   Assessment/ Plan: -Marked BPH with severe LUTS -TRUS with volume of 120 cc.  We discussed he would not be a UroLift candidate.  Based on prostate size feel his best option would be Holep. -An appointment will be made with Dr. Erlene Quan or Dr. Diamantina Providence for further discussion   Abbie Sons, MD

## 2019-11-25 ENCOUNTER — Encounter: Payer: Self-pay | Admitting: Gastroenterology

## 2019-11-25 LAB — MICROSCOPIC EXAMINATION: Bacteria, UA: NONE SEEN

## 2019-11-25 LAB — URINALYSIS, COMPLETE
Bilirubin, UA: NEGATIVE
Glucose, UA: NEGATIVE
Ketones, UA: NEGATIVE
Leukocytes,UA: NEGATIVE
Nitrite, UA: NEGATIVE
Protein,UA: NEGATIVE
Specific Gravity, UA: 1.025 (ref 1.005–1.030)
Urobilinogen, Ur: 0.2 mg/dL (ref 0.2–1.0)
pH, UA: 6 (ref 5.0–7.5)

## 2019-11-27 ENCOUNTER — Ambulatory Visit: Payer: Self-pay | Admitting: Urology

## 2019-12-04 ENCOUNTER — Other Ambulatory Visit: Payer: Self-pay

## 2019-12-04 ENCOUNTER — Ambulatory Visit: Payer: 59 | Admitting: Urology

## 2019-12-04 ENCOUNTER — Encounter: Payer: Self-pay | Admitting: Urology

## 2019-12-04 VITALS — BP 179/99 | HR 67 | Ht 66.0 in | Wt 200.0 lb

## 2019-12-04 DIAGNOSIS — N401 Enlarged prostate with lower urinary tract symptoms: Secondary | ICD-10-CM

## 2019-12-04 LAB — BLADDER SCAN AMB NON-IMAGING: Scan Result: 101

## 2019-12-04 NOTE — Progress Notes (Signed)
   12/04/2019 2:29 PM   [place] October 29, 1945 [id number]  Reason for visit: Discuss HOLEP  HPI: I saw Mr. Sekula today for discussion of HOLEP.  He is a healthy 74 year old male that has been evaluated by Dr. Bernardo Heater for BPH and worsening urinary symptoms.  His IPSS score today in clinic is 19 with quality of life mostly dissatisfied.  PVR is 101 mL.  Last PSA was 4 which is normal for his age.  He is interested in an outlet procedure, and underwent cystoscopy and TRUS with Dr. Bernardo Heater that showed a 120 g prostate with significant hypertrophy, and was referred for further discussion of HoLEP.  He is primarily bothered by weak stream, dribbling, and feeling of incomplete emptying, as well as nocturia.  He is on maximal medical therapy with alfuzosin, finasteride, and Cialis.  We discussed the risks and benefits of HoLEP at length.  The procedure requires general anesthesia and takes 2 to 3 hours, and a holmium laser is used to enucleate the prostate and push this tissue into the bladder.  A morcellator is then used to remove this tissue, which is sent for pathology.  The vast majority of patients are able to discharge the same day with a catheter in place for 2 to 3 days, and will follow-up in clinic for a voiding trial.  Approximately 5% of patients will be admitted overnight to monitor the urine, or if they have multiple co-morbidities.  We specifically discussed the risks of bleeding, infection, retrograde ejaculation, temporary urgency and urge incontinence, very low risk of long-term incontinence, pathologic evaluation of prostate tissue and possible detection of prostate cancer or other malignancy, and possible need for additional procedures.  Patient will discuss with his wife and call to schedule HoLEP   Billey Co, MD  [facility] [address] [place], [place] [address] [phone]

## 2019-12-04 NOTE — Patient Instructions (Signed)

## 2019-12-08 ENCOUNTER — Other Ambulatory Visit: Payer: Self-pay | Admitting: Urology

## 2019-12-26 ENCOUNTER — Telehealth: Payer: Self-pay | Admitting: Urology

## 2019-12-26 NOTE — Telephone Encounter (Signed)
Called pt informed him of the information below. Pt gave verbal understanding.  

## 2019-12-26 NOTE — Telephone Encounter (Signed)
No affect on upcoming surgery, thanks  Nickolas Madrid, MD 12/26/2019

## 2019-12-26 NOTE — Telephone Encounter (Signed)
Pt called to let Dr. 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 He forgot to mention that when he was in office. He would like to know if that will affect upcoming surgery in any way. Please advise.

## 2019-12-27 ENCOUNTER — Ambulatory Visit (AMBULATORY_SURGERY_CENTER): Payer: Self-pay | Admitting: *Deleted

## 2019-12-27 ENCOUNTER — Other Ambulatory Visit: Payer: Self-pay

## 2019-12-27 VITALS — Ht 67.0 in | Wt 199.0 lb

## 2019-12-27 DIAGNOSIS — Z86010 Personal history of colonic polyps: Secondary | ICD-10-CM

## 2019-12-27 NOTE — Progress Notes (Signed)
10-01-2019 comp covid vaccine No egg or soy allergy known to patient  No issues with past sedation with any surgeries  or procedures, no intubation problems  No diet pills per patient No home 02 use per patient  No blood thinners per patient  Pt denies issues with constipation  No A fib or A flutter  EMMI video sent to pt's e mail   Due to the COVID-19 pandemic we are asking patients to follow these guidelines. Please only bring one care partner. Please be aware that your care partner may wait in the car in the parking lot or if they feel like they will be too hot to wait in the car, they may wait in the lobby on the 4th floor. All care partners are required to wear a mask the entire time (we do not have any that we can provide them), they need to practice social distancing, and we will do a Covid check for all patient's and care partners when you arrive. Also we will check their temperature and your temperature. If the care partner waits in their car they need to stay in the parking lot the entire time and we will call them on their cell phone when the patient is ready for discharge so they can bring the car to the front of the building. Also all patient's will need to wear a mask into building.

## 2019-12-30 ENCOUNTER — Encounter: Payer: Self-pay | Admitting: Gastroenterology

## 2020-01-01 ENCOUNTER — Telehealth: Payer: Self-pay | Admitting: Radiology

## 2020-01-01 NOTE — Telephone Encounter (Signed)
Patient would like to cancel HOLEP with Dr Diamantina Providence on 01/17/2020 due to a work conflict. He would also like a refill of sildenafil that was prescribed by his previous doctor.

## 2020-01-02 MED ORDER — SILDENAFIL CITRATE 20 MG PO TABS: 20.0000 mg | ORAL_TABLET | Freq: Every day | ORAL | 11 refills | Status: DC | PRN

## 2020-01-02 NOTE — Telephone Encounter (Signed)
Thanks, I will put in the sildenafil refill.  Please offer him visit in 3-6 months with IPSS/PVR and symptom check, or can re-schedule HoLEP if patient desires  Clayton Madrid, MD 01/02/2020

## 2020-01-02 NOTE — Addendum Note (Signed)
Addended by: Nickolas Madrid C on: 01/02/2020 09:00 AM   Modules accepted: Orders

## 2020-01-03 NOTE — Telephone Encounter (Signed)
Patient prefers to schedule follow up appointment. Appointment made.  Notified patient of script sent to pharmacy.

## 2020-01-07 ENCOUNTER — Other Ambulatory Visit: Payer: 59

## 2020-01-10 ENCOUNTER — Encounter: Payer: 59 | Admitting: Gastroenterology

## 2020-01-15 ENCOUNTER — Other Ambulatory Visit: Payer: 59

## 2020-01-17 ENCOUNTER — Ambulatory Visit: Admit: 2020-01-17 | Payer: 59 | Admitting: Urology

## 2020-01-17 SURGERY — ENUCLEATION, PROSTATE, USING LASER, WITH MORCELLATION
Anesthesia: Choice

## 2020-01-20 ENCOUNTER — Encounter: Payer: 59 | Admitting: Gastroenterology

## 2020-01-20 ENCOUNTER — Encounter: Payer: Self-pay | Admitting: Internal Medicine

## 2020-01-20 ENCOUNTER — Ambulatory Visit (AMBULATORY_SURGERY_CENTER): Payer: 59 | Admitting: Internal Medicine

## 2020-01-20 ENCOUNTER — Other Ambulatory Visit: Payer: Self-pay

## 2020-01-20 VITALS — BP 144/72 | HR 58 | Temp 97.5°F | Resp 13 | Ht 66.0 in | Wt 199.0 lb

## 2020-01-20 DIAGNOSIS — Z860100 Personal history of colon polyps, unspecified: Secondary | ICD-10-CM

## 2020-01-20 DIAGNOSIS — D123 Benign neoplasm of transverse colon: Secondary | ICD-10-CM

## 2020-01-20 MED ORDER — SODIUM CHLORIDE 0.9 % IV SOLN: 500.0000 mL | Freq: Once | INTRAVENOUS | Status: DC

## 2020-01-20 NOTE — Op Note (Signed)
[facility] Patient Name: Clayton Powell Procedure Date: 01/20/2020 2:54 PM MRN: [id number] Endoscopist: Jerene Bears , MD Age: 74 Referring MD:  Date of Birth: March 24, 1946 Gender: Male Account #: [id number] Procedure:                Colonoscopy Indications:              High risk colon cancer surveillance: Personal                            history of non-advanced adenoma, Last colonoscopy:                            June 2016 Medicines:                Monitored Anesthesia Care Procedure:                Pre-Anesthesia Assessment:                           - Prior to the procedure, a History and Physical                            was performed, and patient medications and                            allergies were reviewed. The patient's tolerance of                            previous anesthesia was also reviewed. The risks                            and benefits of the procedure and the sedation                            options and risks were discussed with the patient.                            All questions were answered, and informed consent                            was obtained. Prior Anticoagulants: The patient has                            taken no previous anticoagulant or antiplatelet                            agents. ASA Grade Assessment: III - A patient with                            severe systemic disease. After reviewing the risks                            and benefits, the patient was deemed in  satisfactory condition to undergo the procedure.                           After obtaining informed consent, the colonoscope                            was passed under direct vision. Throughout the                            procedure, the patient's blood pressure, pulse, and                            oxygen saturations were monitored continuously. The                            Colonoscope was introduced through the anus and                             advanced to the cecum, identified by appendiceal                            orifice and ileocecal valve. The colonoscopy was                            performed without difficulty. The patient tolerated                            the procedure well. The quality of the bowel                            preparation was good. The ileocecal valve,                            appendiceal orifice, and rectum were photographed. Scope In: 3:04:47 PM Scope Out: 3:19:32 PM Scope Withdrawal Time: 0 hours 12 minutes 58 seconds  Total Procedure Duration: 0 hours 14 minutes 45 seconds  Findings:                 The digital rectal exam was normal.                           A 4 mm polyp was found in the transverse colon. The                            polyp was sessile. The polyp was removed with a                            cold snare. Resection and retrieval were complete.                           Multiple small and large-mouthed diverticula were                            found in the sigmoid colon, descending colon,  transverse colon and ascending colon.                           The retroflexed view of the distal rectum and anal                            verge was normal and showed no anal or rectal                            abnormalities. Complications:            No immediate complications. Estimated Blood Loss:     Estimated blood loss: none. Impression:               - One 4 mm polyp in the transverse colon, removed                            with a cold snare. Resected and retrieved.                           - Diverticulosis in the sigmoid colon, in the                            descending colon, in the transverse colon and in                            the ascending colon.                           - The distal rectum and anal verge are normal on                            retroflexion view. Recommendation:           - Patient has a contact  number available for                            emergencies. The signs and symptoms of potential                            delayed complications were discussed with the                            patient. Return to normal activities tomorrow.                            Written discharge instructions were provided to the                            patient.                           - Resume previous diet.                           - Continue present medications.                           -  Await pathology results.                           - No repeat colonoscopy due to age at next                            screening/surveillance interval (age 55). Jerene Bears, MD 01/20/2020 3:22:49 PM This report has been signed electronically.

## 2020-01-20 NOTE — Patient Instructions (Signed)
Handouts given:  Polyps, Diverticulosis Resume previous diet Continue present medications Await pathology results No repeat colonoscopy due to age at next screen(80)   YOU HAD AN ENDOSCOPIC PROCEDURE TODAY AT [facility]:   Refer to the procedure report that was given to you for any specific questions about what was found during the examination.  If the procedure report does not answer your questions, please call your gastroenterologist to clarify.  If you requested that your care partner not be given the details of your procedure findings, then the procedure report has been included in a sealed envelope for you to review at your convenience later.  YOU SHOULD EXPECT: Some feelings of bloating in the abdomen. Passage of more gas than usual.  Walking can help get rid of the air that was put into your GI tract during the procedure and reduce the bloating. If you had a lower endoscopy (such as a colonoscopy or flexible sigmoidoscopy) you may notice spotting of blood in your stool or on the toilet paper. If you underwent a bowel prep for your procedure, you may not have a normal bowel movement for a few days.  Please Note:  You might notice some irritation and congestion in your nose or some drainage.  This is from the oxygen used during your procedure.  There is no need for concern and it should clear up in a day or so.  SYMPTOMS TO REPORT IMMEDIATELY:   Following lower endoscopy (colonoscopy or flexible sigmoidoscopy):  Excessive amounts of blood in the stool  Significant tenderness or worsening of abdominal pains  Swelling of the abdomen that is new, acute  Fever of 100F or higher   For urgent or emergent issues, a gastroenterologist can be reached at any hour by calling [phone]. Do not use MyChart messaging for urgent concerns.    DIET:  We do recommend a small meal at first, but then you may proceed to your regular diet.  Drink plenty of fluids but you should  avoid alcoholic beverages for 24 hours.  ACTIVITY:  You should plan to take it easy for the rest of today and you should NOT DRIVE or use heavy machinery until tomorrow (because of the sedation medicines used during the test).    FOLLOW UP: Our staff will call the number listed on your records 48-72 hours following your procedure to check on you and address any questions or concerns that you may have regarding the information given to you following your procedure. If we do not reach you, we will leave a message.  We will attempt to reach you two times.  During this call, we will ask if you have developed any symptoms of COVID 19. If you develop any symptoms (ie: fever, flu-like symptoms, shortness of breath, cough etc.) before then, please call [phone].  If you test positive for Covid 19 in the 2 weeks post procedure, please call and report this information to [place].    If any biopsies were taken you will be contacted by phone or by letter within the next 1-3 weeks.  Please call us at [phone] if you have not heard about the biopsies in 3 weeks.    SIGNATURES/CONFIDENTIALITY: You and/or your care partner have signed paperwork which will be entered into your electronic medical record.  These signatures attest to the fact that that the information above on your After Visit Summary has been reviewed and is understood.  Full responsibility of the confidentiality of this discharge  information lies with you and/or your care-partner.YOU HAD AN ENDOSCOPIC PROCEDURE TODAY AT [facility]:   Refer to the procedure report that was given to you for any specific questions about what was found during the examination.  If the procedure report does not answer your questions, please call your gastroenterologist to clarify.  If you requested that your care partner not be given the details of your procedure findings, then the procedure report has been included in a sealed envelope for you to review  at your convenience later.  YOU SHOULD EXPECT: Some feelings of bloating in the abdomen. Passage of more gas than usual.  Walking can help get rid of the air that was put into your GI tract during the procedure and reduce the bloating. If you had a lower endoscopy (such as a colonoscopy or flexible sigmoidoscopy) you may notice spotting of blood in your stool or on the toilet paper. If you underwent a bowel prep for your procedure, you may not have a normal bowel movement for a few days.  Please Note:  You might notice some irritation and congestion in your nose or some drainage.  This is from the oxygen used during your procedure.  There is no need for concern and it should clear up in a day or so.  SYMPTOMS TO REPORT IMMEDIATELY:   Following lower endoscopy (colonoscopy or flexible sigmoidoscopy):  Excessive amounts of blood in the stool  Significant tenderness or worsening of abdominal pains  Swelling of the abdomen that is new, acute  Fever of 100F or higher   Following upper endoscopy (EGD)  Vomiting of blood or coffee ground material  New chest pain or pain under the shoulder blades  Painful or persistently difficult swallowing  New shortness of breath  Fever of 100F or higher  Black, tarry-looking stools  For urgent or emergent issues, a gastroenterologist can be reached at any hour by calling [phone]. Do not use MyChart messaging for urgent concerns.    DIET:  We do recommend a small meal at first, but then you may proceed to your regular diet.  Drink plenty of fluids but you should avoid alcoholic beverages for 24 hours.  ACTIVITY:  You should plan to take it easy for the rest of today and you should NOT DRIVE or use heavy machinery until tomorrow (because of the sedation medicines used during the test).    FOLLOW UP: Our staff will call the number listed on your records 48-72 hours following your procedure to check on you and address any questions or concerns that  you may have regarding the information given to you following your procedure. If we do not reach you, we will leave a message.  We will attempt to reach you two times.  During this call, we will ask if you have developed any symptoms of COVID 19. If you develop any symptoms (ie: fever, flu-like symptoms, shortness of breath, cough etc.) before then, please call [phone].  If you test positive for Covid 19 in the 2 weeks post procedure, please call and report this information to [place].    If any biopsies were taken you will be contacted by phone or by letter within the next 1-3 weeks.  Please call us at [phone] if you have not heard about the biopsies in 3 weeks.    SIGNATURES/CONFIDENTIALITY: You and/or your care partner have signed paperwork which will be entered into your electronic medical record.  These signatures attest to the  fact that that the information above on your After Visit Summary has been reviewed and is understood.  Full responsibility of the confidentiality of this discharge information lies with you and/or your care-partner.

## 2020-01-20 NOTE — Progress Notes (Signed)
Pt's states no medical or surgical changes since previsit or office visit. 

## 2020-01-20 NOTE — Progress Notes (Signed)
pt tolerated well. VSS. awake and to recovery. Report given to RN.  

## 2020-01-20 NOTE — Progress Notes (Signed)
Called to room to assist during endoscopic procedure.  Patient ID and intended procedure confirmed with present staff. Received instructions for my participation in the procedure from the performing physician.  

## 2020-01-22 ENCOUNTER — Telehealth: Payer: Self-pay | Admitting: *Deleted

## 2020-01-22 NOTE — Telephone Encounter (Signed)
  Follow up Call-  Call back number 01/20/2020  Post procedure Call Back phone  # [phone]  Permission to leave phone message Yes  Some recent data might be hidden     Patient questions:  Do you have a fever, pain , or abdominal swelling? No. Pain Score  0 *  Have you tolerated food without any problems? Yes.    Have you been able to return to your normal activities? Yes.    Do you have any questions about your discharge instructions: Diet   No. Medications  No. Follow up visit  No.  Do you have questions or concerns about your Care? No.  Actions: * If pain score is 4 or above: No action needed, pain <4.  1. Have you developed a fever since your procedure? no  2.   Have you had an respiratory symptoms (SOB or cough) since your procedure? no  3.   Have you tested positive for COVID 19 since your procedure no  4.   Have you had any family members/close contacts diagnosed with the COVID 19 since your procedure?  no   If yes to any of these questions please route to Joylene John, RN and Erenest Rasher, RN

## 2020-01-22 NOTE — Telephone Encounter (Signed)
  Follow up Call-  Call back number 01/20/2020  Post procedure Call Back phone  # [phone]  Permission to leave phone message Yes  Some recent data might be hidden    lmom

## 2020-01-28 ENCOUNTER — Encounter: Payer: Self-pay | Admitting: Internal Medicine

## 2020-04-09 ENCOUNTER — Ambulatory Visit: Payer: 59 | Admitting: Urology

## 2020-04-09 ENCOUNTER — Encounter: Payer: Self-pay | Admitting: Urology

## 2020-04-09 ENCOUNTER — Other Ambulatory Visit: Payer: Self-pay

## 2020-04-09 VITALS — BP 176/98 | HR 69 | Ht 66.0 in | Wt 200.0 lb

## 2020-04-09 DIAGNOSIS — N401 Enlarged prostate with lower urinary tract symptoms: Secondary | ICD-10-CM

## 2020-04-09 LAB — BLADDER SCAN AMB NON-IMAGING

## 2020-04-09 MED ORDER — TADALAFIL 5 MG PO TABS: 5.0000 mg | ORAL_TABLET | Freq: Every day | ORAL | 3 refills | Status: DC

## 2020-04-09 MED ORDER — FINASTERIDE 5 MG PO TABS: 5.0000 mg | ORAL_TABLET | Freq: Every day | ORAL | 3 refills | Status: DC

## 2020-04-09 MED ORDER — ALFUZOSIN HCL ER 10 MG PO TB24: 10.0000 mg | ORAL_TABLET | Freq: Every day | ORAL | 3 refills | Status: DC

## 2020-04-09 NOTE — Progress Notes (Signed)
   04/09/2020 1:24 PM   [place] 1946-05-06 [id number]  Reason for visit: Follow up BPH  HPI: I saw Mr. Bujak back in urology clinic for follow-up of BPH.  He is a 74 year old male who works as a Market researcher for [employer] and was previously followed by Dr. Jacqlyn Larsen and Dr. Bernardo Heater.  He was interested in UroLift for his worsening urinary symptoms, and underwent a cystoscopy and TRUS with Dr. Bernardo Heater in April 2021 showing a 120 g prostate with obstructing lateral lobes and mild to moderate bladder trabeculations.  He was originally scheduled for HOLEP, but work got busy and he changed his mind and opted for continuation of maximal medical management with alfuzosin, finasteride, and Cialis.  His PSA has been essentially stable at ~4 over the last 3 years.  He opted to discontinue PSA screening per the AUA guidelines.  His primary urinary complaint is nocturia 2 times per night.  IPSS score today is 11, with quality of life mixed, and PVR is normal at 28 mL.  We again reviewed the risks and benefits of HOLEP at length.  He also had questions about PAE.  We discussed that this was not approved by the AUA as a routine procedure for BPH, and I discouraged him from pursuing this as a treatment option.  We discussed return precautions at length including gross hematuria, UTI, or urinary retention, or worsening urinary symptoms.  Medications refilled including alfuzosin, Cialis, and finasteride    Billey Co, MD  [facility] [address], [place], [place] [address] [phone]

## 2020-04-09 NOTE — Patient Instructions (Signed)
Holmium Laser Enucleation of the Prostate (HoLEP)  HoLEP is a treatment for men with benign prostatic hyperplasia (BPH). The laser surgery removed blockages of urine flow, and is done without any incisions on the body.     What is HoLEP?  HoLEP is a type of laser surgery used to treat obstruction (blockage) of urine flow as a result of benign prostatic hyperplasia (BPH). In men with BPH, the prostate gland is not cancerous, but has become enlarged. An enlarged prostate can result in a number of urinary tract symptoms such as weak urinary stream, difficulty in starting urination, inability to urinate, frequent urination, or getting up at night to urinate.  HoLEP was developed in the 1990's as a more effective and less expensive surgical option for BPH, compared to other surgical options such as laser vaporization(PVP/greenlight laser), transurethral resection of the prostate(TURP), and open simple prostatectomy.   What happens during a HoLEP?  HoLEP requires general anesthesia ("asleep" throughout the procedure).   An antibiotic is given to reduce the risk of infection  A surgical instrument called a resectoscope is inserted through the urethra (the tube that carries urine from the bladder). The resectoscope has a camera that allows the surgeon to view the internal structure of the prostate gland, and to see where the incisions are being made during surgery.  The laser is inserted into the resectoscope and is used to enucleate (free up) the enlarged prostate tissue from the capsule (outer shell) and then to seal up any blood vessels. The tissue that has been removed is pushed back into the bladder.  A morcellator is placed through the resectoscope, and is used to suction out the prostate tissue that has been pushed into the bladder.  When the prostate tissue has been removed, the resectoscope is removed, and a foley catheter is placed to allow healing and drain the urine from the  bladder.     What happens after a HoLEP?  More than 90% of patients go home the same day a few hours after surgery. Less than 10% will be admitted to the hospital overnight for observation to monitor the urine, or if they have other medical problems.  Fluid is flushed through the catheter for about 1 hour after surgery to clear any blood from the urine. It is normal to have some blood in the urine after surgery. The need for blood transfusion is extremely rare.  Eating and drinking are permitted after the procedure once the patient has fully awakened from anesthesia.  The catheter is usually removed 2-3 days after surgery- the patient will come to clinic to have the catheter removed and make sure they can urinate on their own.  It is very important to drink lots of fluids after surgery for one week to keep the bladder flushed.  At first, there may be some burning with urination, but this typically improved within a few hours to days. Most patients do not have a significant amount of pain, and narcotic pain medications are rarely needed.  Symptoms of urinary frequency, urgency, and even leakage are NORMAL for the first few weeks after surgery as the bladder adjusts after having to work hard against blockage from the prostate for many years. This will improve, but can sometimes take several months.  The use of pelvic floor exercises (Kegel exercises) can help improve problems with urinary incontinence.   After catheter removal, patients will be seen at 6 weeks and 6 months for symptom check  No heavy lifting for   at least 2-3 weeks after surgery, however patients can walk and do light activities the first day after surgery. Return to work time depends on occupation.    What are the advantages of HoLEP?  HoLEP has been studied in many different parts of the world and has been shown to be a safe and effective procedure. Although there are many types of BPH surgeries available, HoLEP offers a  unique advantage in being able to remove a large amount of tissue without any incisions on the body, even in very large prostates, while decreasing the risk of bleeding and providing tissue for pathology (to look for cancer). This decreases the need for blood transfusions during surgery, minimizes hospital stay, and reduces the risk of needing repeat treatment.  What are the side effects of HoLEP?  Temporary burning and bleeding during urination. Some blood may be seen in the urine for weeks after surgery and is part of the healing process.  Urinary incontinence (inability to control urine flow) is expected in all patients immediately after surgery and they should wear pads for the first few days/weeks. This typically improves over the course of several weeks. Performing Kegel exercises can help decrease leakage from stress maneuvers such as coughing, sneezing, or lifting. The rate of long term leakage is very low. Patients may also have leakage with urgency and this may be treated with medication. The risk of urge incontinence can be dependent on several factors including age, prostate size, symptoms, and other medical problems.  Retrograde ejaculation or "backwards ejaculation." In 75% of cases, the patient will not see any fluid during ejaculation after surgery.  Erectile function is generally not significantly affected.   What are the risks of HoLEP?  Injury to the urethra or development of scar tissue at a later date  Injury to the capsule of the prostate (typically treated with longer catheterization).  Injury to the bladder or ureteral orifices (where the urine from the kidney drains out)  Infection of the bladder, testes, or kidneys  Return of urinary obstruction at a later date requiring another operation (<2%)  Need for blood transfusion or re-operation due to bleeding  Failure to relieve all symptoms and/or need for prolonged catheterization after surgery  5-15% of patients are  found to have previously undiagnosed prostate cancer in their specimen. Prostate cancer can be treated after HoLEP.  Standard risks of anesthesia including blood clots, heart attacks, etc  When should I call my doctor?  Fever over 101.3 degrees  Inability to urinate, or large blood clots in the urine     Benign Prostatic Hyperplasia  Benign prostatic hyperplasia (BPH) is an enlarged prostate gland that is caused by the normal aging process and not by cancer. The prostate is a walnut-sized gland that is involved in the production of semen. It is located in front of the rectum and below the bladder. The bladder stores urine and the urethra is the tube that carries the urine out of the body. The prostate may get bigger as a man gets older. An enlarged prostate can press on the urethra. This can make it harder to pass urine. The build-up of urine in the bladder can cause infection. Back pressure and infection may progress to bladder damage and kidney (renal) failure. What are the causes? This condition is part of a normal aging process. However, not all men develop problems from this condition. If the prostate enlarges away from the urethra, urine flow will not be blocked. If it enlarges toward  the urethra and compresses it, there will be problems passing urine. What increases the risk? This condition is more likely to develop in men over the age of 50 years. What are the signs or symptoms? Symptoms of this condition include:  Getting up often during the night to urinate.  Needing to urinate frequently during the day.  Difficulty starting urine flow.  Decrease in size and strength of your urine stream.  Leaking (dribbling) after urinating.  Inability to pass urine. This needs immediate treatment.  Inability to completely empty your bladder.  Pain when you pass urine. This is more common if there is also an infection.  Urinary tract infection (UTI). How is this diagnosed? This  condition is diagnosed based on your medical history, a physical exam, and your symptoms. Tests will also be done, such as:  A post-void bladder scan. This measures any amount of urine that may remain in your bladder after you finish urinating.  A digital rectal exam. In a rectal exam, your health care provider checks your prostate by putting a lubricated, gloved finger into your rectum to feel the back of your prostate gland. This exam detects the size of your gland and any abnormal lumps or growths.  An exam of your urine (urinalysis).  A prostate specific antigen (PSA) screening. This is a blood test used to screen for prostate cancer.  An ultrasound. This test uses sound waves to electronically produce a picture of your prostate gland. Your health care provider may refer you to a specialist in kidney and prostate diseases (urologist). How is this treated? Once symptoms begin, your health care provider will monitor your condition (active surveillance or watchful waiting). Treatment for this condition will depend on the severity of your condition. Treatment may include:  Observation and yearly exams. This may be the only treatment needed if your condition and symptoms are mild.  Medicines to relieve your symptoms, including: ? Medicines to shrink the prostate. ? Medicines to relax the muscle of the prostate.  Surgery in severe cases. Surgery may include: ? Prostatectomy. In this procedure, the prostate tissue is removed completely through an open incision or with a laparoscope or robotics. ? Transurethral resection of the prostate (TURP). In this procedure, a tool is inserted through the opening at the tip of the penis (urethra). It is used to cut away tissue of the inner core of the prostate. The pieces are removed through the same opening of the penis. This removes the blockage. ? Transurethral incision (TUIP). In this procedure, small cuts are made in the prostate. This lessens the  prostate's pressure on the urethra. ? Transurethral microwave thermotherapy (TUMT). This procedure uses microwaves to create heat. The heat destroys and removes a small amount of prostate tissue. ? Transurethral needle ablation (TUNA). This procedure uses radio frequencies to destroy and remove a small amount of prostate tissue. ? Interstitial laser coagulation (ILC). This procedure uses a laser to destroy and remove a small amount of prostate tissue. ? Transurethral electrovaporization (TUVP). This procedure uses electrodes to destroy and remove a small amount of prostate tissue. ? Prostatic urethral lift. This procedure inserts an implant to push the lobes of the prostate away from the urethra. Follow these instructions at home:  Take over-the-counter and prescription medicines only as told by your health care provider.  Monitor your symptoms for any changes. Contact your health care provider with any changes.  Avoid drinking large amounts of liquid before going to bed or out in public.    Avoid or reduce how much caffeine or alcohol you drink.  Give yourself time when you urinate.  Keep all follow-up visits as told by your health care provider. This is important. Contact a health care provider if:  You have unexplained back pain.  Your symptoms do not get better with treatment.  You develop side effects from the medicine you are taking.  Your urine becomes very dark or has a bad smell.  Your lower abdomen becomes distended and you have trouble passing your urine. Get help right away if:  You have a fever or chills.  You suddenly cannot urinate.  You feel lightheaded, or very dizzy, or you faint.  There are large amounts of blood or clots in the urine.  Your urinary problems become hard to manage.  You develop moderate to severe low back or flank pain. The flank is the side of your body between the ribs and the hip. These symptoms may represent a serious problem that is an  emergency. Do not wait to see if the symptoms will go away. Get medical help right away. Call your local emergency services (911 in the [place] Do not drive yourself to the hospital. Summary  Benign prostatic hyperplasia (BPH) is an enlarged prostate that is caused by the normal aging process and not by cancer.  An enlarged prostate can press on the urethra. This can make it hard to pass urine.  This condition is part of a normal aging process and is more likely to develop in men over the age of 37 years.  Get help right away if you suddenly cannot urinate. This information is not intended to replace advice given to you by your health care provider. Make sure you discuss any questions you have with your health care provider. Document Revised: 06/05/2018 Document Reviewed: 08/15/2016 Elsevier Patient Education  2020 [employer].

## 2020-05-25 ENCOUNTER — Ambulatory Visit
Admission: EM | Admit: 2020-05-25 | Discharge: 2020-05-25 | Disposition: A | Discharge status: Home / Self Care | Payer: 59 | Source: Home / Self Care | Attending: Family Medicine | Admitting: Family Medicine

## 2020-05-25 ENCOUNTER — Other Ambulatory Visit: Payer: Self-pay

## 2020-05-25 ENCOUNTER — Emergency Department: Payer: 59

## 2020-05-25 ENCOUNTER — Telehealth: Payer: Self-pay

## 2020-05-25 DIAGNOSIS — R778 Other specified abnormalities of plasma proteins: Secondary | ICD-10-CM | POA: Insufficient documentation

## 2020-05-25 DIAGNOSIS — I119 Hypertensive heart disease without heart failure: Secondary | ICD-10-CM | POA: Diagnosis not present

## 2020-05-25 DIAGNOSIS — K219 Gastro-esophageal reflux disease without esophagitis: Secondary | ICD-10-CM | POA: Diagnosis present

## 2020-05-25 DIAGNOSIS — I25110 Atherosclerotic heart disease of native coronary artery with unstable angina pectoris: Principal | ICD-10-CM | POA: Diagnosis present

## 2020-05-25 DIAGNOSIS — I25119 Atherosclerotic heart disease of native coronary artery with unspecified angina pectoris: Principal | ICD-10-CM | POA: Insufficient documentation

## 2020-05-25 DIAGNOSIS — Z7982 Long term (current) use of aspirin: Secondary | ICD-10-CM | POA: Insufficient documentation

## 2020-05-25 DIAGNOSIS — Z888 Allergy status to other drugs, medicaments and biological substances status: Secondary | ICD-10-CM

## 2020-05-25 DIAGNOSIS — E785 Hyperlipidemia, unspecified: Secondary | ICD-10-CM | POA: Diagnosis present

## 2020-05-25 DIAGNOSIS — Z7951 Long term (current) use of inhaled steroids: Secondary | ICD-10-CM

## 2020-05-25 DIAGNOSIS — Z79899 Other long term (current) drug therapy: Secondary | ICD-10-CM | POA: Diagnosis not present

## 2020-05-25 DIAGNOSIS — Z20822 Contact with and (suspected) exposure to covid-19: Secondary | ICD-10-CM | POA: Diagnosis not present

## 2020-05-25 DIAGNOSIS — R079 Chest pain, unspecified: Secondary | ICD-10-CM

## 2020-05-25 DIAGNOSIS — I10 Essential (primary) hypertension: Secondary | ICD-10-CM | POA: Diagnosis present

## 2020-05-25 DIAGNOSIS — N400 Enlarged prostate without lower urinary tract symptoms: Secondary | ICD-10-CM | POA: Diagnosis present

## 2020-05-25 DIAGNOSIS — Z881 Allergy status to other antibiotic agents status: Secondary | ICD-10-CM

## 2020-05-25 DIAGNOSIS — I2582 Chronic total occlusion of coronary artery: Secondary | ICD-10-CM | POA: Diagnosis present

## 2020-05-25 LAB — CBC WITH DIFFERENTIAL/PLATELET
Abs Immature Granulocytes: 0.03 10*3/uL (ref 0.00–0.07)
Basophils Absolute: 0 10*3/uL (ref 0.0–0.1)
Basophils Relative: 0 %
Eosinophils Absolute: 0.3 10*3/uL (ref 0.0–0.5)
Eosinophils Relative: 4 %
HCT: 41.8 % (ref 39.0–52.0)
Hemoglobin: 13.9 g/dL (ref 13.0–17.0)
Immature Granulocytes: 0 %
Lymphocytes Relative: 28 %
Lymphs Abs: 2.2 10*3/uL (ref 0.7–4.0)
MCH: 30.6 pg (ref 26.0–34.0)
MCHC: 33.3 g/dL (ref 30.0–36.0)
MCV: 92.1 fL (ref 80.0–100.0)
Monocytes Absolute: 0.7 10*3/uL (ref 0.1–1.0)
Monocytes Relative: 9 %
Neutro Abs: 4.6 10*3/uL (ref 1.7–7.7)
Neutrophils Relative %: 59 %
Platelets: 229 10*3/uL (ref 150–400)
RBC: 4.54 MIL/uL (ref 4.22–5.81)
RDW: 12.7 % (ref 11.5–15.5)
WBC: 7.8 10*3/uL (ref 4.0–10.5)
nRBC: 0 % (ref 0.0–0.2)

## 2020-05-25 LAB — COMPREHENSIVE METABOLIC PANEL
ALT: 29 U/L (ref 0–44)
AST: 34 U/L (ref 15–41)
Albumin: 4.4 g/dL (ref 3.5–5.0)
Alkaline Phosphatase: 73 U/L (ref 38–126)
Anion gap: 9 (ref 5–15)
BUN: 21 mg/dL (ref 8–23)
CO2: 27 mmol/L (ref 22–32)
Calcium: 8.8 mg/dL — ABNORMAL LOW (ref 8.9–10.3)
Chloride: 100 mmol/L (ref 98–111)
Creatinine, Ser: 0.93 mg/dL (ref 0.61–1.24)
GFR, Estimated: >60 mL/min (ref >60)
Glucose, Bld: 90 mg/dL (ref 70–99)
Potassium: 4.1 mmol/L (ref 3.5–5.1)
Sodium: 136 mmol/L (ref 135–145)
Total Bilirubin: 0.8 mg/dL (ref 0.3–1.2)
Total Protein: 7.1 g/dL (ref 6.5–8.1)

## 2020-05-25 LAB — TROPONIN I (HIGH SENSITIVITY)
Troponin I (High Sensitivity): 38 ng/L — ABNORMAL HIGH (ref <18)
Troponin I (High Sensitivity): 35 ng/L — ABNORMAL HIGH

## 2020-05-25 MED ORDER — ASPIRIN 81 MG PO CHEW
324.0000 mg | CHEWABLE_TABLET | Freq: Once | ORAL | Status: AC
  Administered 2020-05-25: 324 mg via ORAL

## 2020-05-25 NOTE — ED Triage Notes (Signed)
Patient presents to Urgent Care with complaints of chest pain since 3-4 weeks ago. CP is centralized, worse with certain movements and exertion, has a GI appt in a few days, thinks it might be indigestion or a hernia but his doctor wanted to be sure so sent him here. Patient reports milk makes it worse. Has been taking omeprazole for his potential heartburn and some TUMS but it has not improved.

## 2020-05-25 NOTE — Telephone Encounter (Signed)
Agree with the eval now. Thanks. Will await follow-up notes.

## 2020-05-25 NOTE — ED Notes (Signed)
Patient is being discharged from the Urgent Care and sent to the Emergency Department via EMS . Per Dr. Lacinda Axon, patient is in need of higher level of care due to positive troponin. Patient is aware and verbalizes understanding of plan of care.  Vitals:   05/25/20 1647  BP: (!) 166/95  Pulse: 70  Resp: 16  Temp: 98.1 F (36.7 C)  SpO2: 99%

## 2020-05-25 NOTE — ED Provider Notes (Signed)
[facility]    CSN: [id number] Arrival date & time: 05/25/20  1634      History   Chief Complaint Chief Complaint  Patient presents with  . Chest Pain   HPI  74 year old male presents with chest pain.  Patient reports ongoing exertional chest pain for at least the past 2 weeks.  He played golf recently and had chest pain with each walk to the next hole.  He states that it has been worsening.  Worse particular today which prompted his visit here.  Located centrally.  He feels like his heartburn.  He called his primary care physician's office and they advised him to come here for evaluation.  He states that his chest pain improves with rest.  Is worse with exertion.  Denies diaphoresis.  He has been taking omeprazole and Tums without resolution.  Has known elevated coronary calcium score as well as dyslipidemia.  Past Medical History:  Diagnosis Date  . Allergy   . BPH (benign prostatic hypertrophy) 6/07  . CAD (coronary artery disease) 2010   dx made by coronary calcium scoring, Normal stress test in 2010.   Marland Kitchen GERD (gastroesophageal reflux disease) 12/05  . HLD (hyperlipidemia) 3/99  . HTN (hypertension) 2002   no meds  . Left rotator cuff tear 08/12/2011   Per Dr. Mardelle Matte 2013 L rotator cuff tear on MRI 2013   . Right rotator cuff tear 05/24/2013  . Wears glasses     Patient Active Problem List   Diagnosis Date Noted  . Hand numbness 07/15/2018  . Impacted cerumen of both ears 04/23/2018  . Acute gastroenteritis 04/12/2017  . Left knee pain 09/13/2016  . Tympanic membrane perforation, marginal, right 09/02/2016  . 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 Ear fullness, bilateral 05/24/2016  . 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 Dysfunction of both eustachian tubes 12/02/2015  . ETD (eustachian tube dysfunction) 09/24/2015  . Adjustment disorder 10/27/2014  . Advance care planning 10/26/2014  . Right rotator cuff tear 05/24/2013  . Benign  prostatic hyperplasia with urinary obstruction 05/18/2012  . Abnormal prostate specific antigen 05/16/2012  . 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 Incomplete bladder emptying 05/16/2012  . Murmur 10/21/2011  . Routine general medical examination at a health care facility 08/12/2011  . Left rotator cuff tear 08/12/2011  . RAYNAUD'S SYNDROME 08/10/2010  . CAD, NATIVE VESSEL 03/16/2009  . UNSPECIFIED HEART DISEASE 03/12/2009  . METABOLIC SYNDROME X 40/98/1191  . GERD 02/18/2007  . ALLERGIC RHINITIS, CHRONIC 02/16/2007  . PROSTATITIS, CHRONIC 02/16/2007    Past Surgical History:  Procedure Laterality Date  . admitted to [facility] chest pain  12/15/05   Dr. Clayborn Bigness  . BLEPHAROPLASTY  10/11   Dr. Loletta Specter   . bone spur removed Right   . COLONOSCOPY  09/24/04   nml; Dr. Epifanio Lesches   . EGD esophagitis by bx  01/26/06   gastritis bx neg h-pylori  . POLYPECTOMY    . right and left prostate needle bx  05/02/03   acute inflammation; Dr. Gaynelle Arabian   . SHOULDER ARTHROSCOPY W/ ROTATOR CUFF REPAIR  12/2011   left  . SHOULDER ARTHROSCOPY WITH ROTATOR CUFF REPAIR AND SUBACROMIAL DECOMPRESSION Right 05/24/2013   Procedure: RIGHT SHOULDER ARTHROSCOPY WITH ROTATOR CUFF REPAIR AND SUBACROMIAL DECOMPRESSION AND PARTIAL ACROMIOPLSTY WITH CORACOACROMIAL RELEASE, DISTAL CLAVICULECTOMY, LABRIAL DEBRIDEMENT;  Surgeon: Johnny Bridge, MD;  Location: [place];  Service: Orthopedics;  Laterality: Right;  . spect ETT nml  12/16/05  .  UPPER GASTROINTESTINAL ENDOSCOPY    . WISDOM TOOTH EXTRACTION         Home Medications    Prior to Admission medications   Medication Sig Start Date End Date Taking? Authorizing Provider  albuterol (VENTOLIN HFA) 108 (90 Base) MCG/ACT inhaler Inhale 2 puffs into the lungs every 4 (four) hours as needed. 01/10/20   [provider]  alfuzosin (UROXATRAL) 10 MG 24 hr tablet Take 1 tablet (10 mg total) by mouth daily with breakfast. 04/09/20    Billey Co, MD  aspirin 81 MG EC tablet Take 81 mg by mouth daily.      [provider]  Azelastine-Fluticasone 137-50 MCG/ACT SUSP Place 1 spray into both nostrils 2 (two) times daily. 12/16/19   [provider]  CALCIUM PO Take 1,200 mg by mouth daily.    [provider]  Cholecalciferol (VITAMIN D) 2000 UNITS CAPS Take 1 capsule by mouth daily.      [provider]  clindamycin (CLEOCIN T) 1 % external solution Apply topically 2 (two) times daily as needed. 01/09/20   [provider]  Cyanocobalamin (VITAMIN B-12) 5000 MCG SUBL Place 1 tablet under the tongue daily.    [provider]  fexofenadine (ALLEGRA) 180 MG tablet Take 180 mg by mouth daily.    [provider]  finasteride (PROSCAR) 5 MG tablet Take 1 tablet (5 mg total) by mouth daily. 04/09/20   Billey Co, MD  ibuprofen (ADVIL,MOTRIN) 200 MG tablet Take 600 mg by mouth daily as needed for moderate pain.     [provider]  Icosapent Ethyl (VASCEPA) 1 G CAPS Take 1 tablet by mouth 4 (four) times daily.    [provider]  montelukast (SINGULAIR) 10 MG tablet Take 10 mg by mouth at bedtime.    [provider]  Multiple Vitamin (MULTIVITAMIN) tablet Take 1 tablet by mouth daily.      [provider]  rosuvastatin (CRESTOR) 5 MG tablet Take 5 mg by mouth daily.    [provider]  SYMBICORT 160-4.5 MCG/ACT inhaler SMARTSIG:2 Puff(s) By Mouth Twice Daily 02/10/20   [provider]  tadalafil (CIALIS) 5 MG tablet Take 1 tablet (5 mg total) by mouth daily. 04/09/20   Billey Co, MD    Family History Family History  Problem Relation Age of Onset  . Stroke Mother   . Glaucoma Other        grandmother at 15  . Barrett's esophagus Maternal Uncle   . Prostate cancer Neg Hx   . Colon cancer Neg Hx   . Colon polyps Neg Hx   . Esophageal cancer Neg Hx   . Rectal cancer Neg Hx   . Stomach cancer Neg Hx      Social History Social History   Tobacco Use  . Smoking status: Never Smoker  . Smokeless tobacco: Never Used  Substance Use Topics  . Alcohol use: Yes    Alcohol/week: 0.0 standard drinks    Comment: beer, occ  . Drug use: No     Allergies   Ciprofloxacin hcl and Quinolones   Review of Systems Review of Systems Per HPI  Physical Exam Triage Vital Signs ED Triage Vitals  Enc Vitals Group     BP 05/25/20 1647 (!) 166/95     Pulse Rate 05/25/20 1647 70     Resp 05/25/20 1647 16     Temp 05/25/20 1647 98.1 F (36.7 C)     Temp  Source 05/25/20 1647 Oral     SpO2 05/25/20 1647 99 %     Weight --      Height --      Head Circumference --      Peak Flow --      Pain Score 05/25/20 1644 0     Pain Loc --      Pain Edu? --      Excl. in [place]? --     Updated Vital Signs BP (!) 166/95 (BP Location: Left Arm)   Pulse 70   Temp 98.1 F (36.7 C) (Oral)   Resp 16   SpO2 99%   Visual Acuity Right Eye Distance:   Left Eye Distance:   Bilateral Distance:    Right Eye Near:   Left Eye Near:    Bilateral Near:     Physical Exam Vitals and nursing note reviewed.  Constitutional:      General: He is not in acute distress.    Appearance: Normal appearance. He is not ill-appearing.  Eyes:     General:        Right eye: No discharge.        Left eye: No discharge.     Conjunctiva/sclera: Conjunctivae normal.  Cardiovascular:     Rate and Rhythm: Normal rate and regular rhythm.     Heart sounds: No murmur heard.   Pulmonary:     Effort: Pulmonary effort is normal.     Breath sounds: Normal breath sounds. No wheezing, rhonchi or rales.  Neurological:     Mental Status: He is alert.  Psychiatric:        Mood and Affect: Mood normal.        Behavior: Behavior normal.    UC Treatments / Results  Labs (all labs ordered are listed, but only abnormal results are displayed) Labs Reviewed  COMPREHENSIVE METABOLIC PANEL - Abnormal; Notable for the following  components:      Result Value   Calcium 8.8 (*)    All other components within normal limits  TROPONIN I (HIGH SENSITIVITY) - Abnormal; Notable for the following components:   Troponin I (High Sensitivity) 38 (*)    All other components within normal limits  CBC WITH DIFFERENTIAL/PLATELET    EKG Interpretation: Normal sinus rhythm with rate of 73.  Normal axis.  Normal intervals.  No acute ST or T wave changes at this time.  Radiology No results found.  Procedures Procedures (including critical care time)  Medications Ordered in UC Medications  aspirin chewable tablet 324 mg (324 mg Oral Given 05/25/20 1756)    Initial Impression / Assessment and Plan / UC Course  I have reviewed the triage vital signs and the nursing notes.  Pertinent labs & imaging results that were available during my care of the patient were reviewed by me and considered in my medical decision making (see chart for details).    74 year old male presents with exertional chest pain.  Troponin elevated at 38.  Patient was given aspirin.  He is going directly to the hospital for further evaluation and management.  Final Clinical Impressions(s) / UC Diagnoses   Final diagnoses:  Chest pain, unspecified type  Elevated troponin   Discharge Instructions   None    ED Prescriptions    None     PDMP not reviewed this encounter.   Coral Spikes, [place] 05/25/20 1826

## 2020-05-25 NOTE — ED Triage Notes (Signed)
PT to ED via EMS from [facility] urgent care. Was seen for CP for a few weeks, thought it was heart burn, has been taking omeprazole and tums with minor relief. PT has also noted that he gets short of breath when walking which is new. Pain and SHOB relieves with rest. PT states trop at UC was 38. Color and respiratory pattern WDL at this time.  Pt was given 1 nitro spray 3 baby Asprin

## 2020-05-25 NOTE — Telephone Encounter (Signed)
Pt left v/m that "for a while" pt has upper respiratory pain upon exertion. If pt walks any distance really hurts in chest. Pain is getting worse. Pt thinks could be reflux; pt has appt with GI on 05/28/20. Pt last seen 07/12/18 and no upcoming appts scheduled. I spoke with pt; Pain that was dull in mid of chest started last wk and pt taking omeprazole and tums. After rest pain would go away. No radiation of pain into arm but pt did have tightness in neck. No N&V and no sweating. Pt played golf on Sat and would have to sit down due to pain in mid chest but no SOB. Today the pain is about 4" higher over breast bone than last wk. No H/A or dizziness. Never had CP like this before;  Pt ate a meal and laid down then would have mid CP. Pt does not notice the tightness in throat now. Pt has no wheezing; pt had a cough but used symbicort and that stopped the cough. Pt is at work and does not have a way to take his BP. Pt will go to [facility] in [place] for eval and possible testing. FYI to Dr Damita Dunnings.

## 2020-05-25 NOTE — ED Notes (Signed)
Pt to ER via EMS from [place] with c/o exertional chest pain for several weeks.  Elevated Trop at [facility] and minor changes to EKG

## 2020-05-25 NOTE — ED Notes (Signed)
Reviewed pt's chart with Dr Joni Fears, despite triage acuity, per MD pt OK to wait his turn for available exam room

## 2020-05-26 ENCOUNTER — Observation Stay: Payer: 59

## 2020-05-26 ENCOUNTER — Observation Stay
Admission: EM | Admit: 2020-05-26 | Discharge: 2020-05-27 | Disposition: A | Discharge status: Home / Self Care | Payer: 59 | Source: Ambulatory Visit | Attending: Family Medicine | Admitting: Family Medicine

## 2020-05-26 ENCOUNTER — Observation Stay
Admit: 2020-05-26 | Discharge: 2020-05-26 | Disposition: A | Discharge status: Home / Self Care | Payer: 59 | Attending: Internal Medicine | Admitting: Internal Medicine

## 2020-05-26 ENCOUNTER — Encounter
Admission: EM | Disposition: A | Discharge status: Other Acute Inpatient Hospital | Payer: Self-pay | Source: Ambulatory Visit | Attending: Emergency Medicine

## 2020-05-26 ENCOUNTER — Encounter: Payer: Self-pay | Admitting: Internal Medicine

## 2020-05-26 DIAGNOSIS — I2510 Atherosclerotic heart disease of native coronary artery without angina pectoris: Secondary | ICD-10-CM | POA: Diagnosis present

## 2020-05-26 DIAGNOSIS — I10 Essential (primary) hypertension: Secondary | ICD-10-CM | POA: Diagnosis not present

## 2020-05-26 DIAGNOSIS — N400 Enlarged prostate without lower urinary tract symptoms: Secondary | ICD-10-CM

## 2020-05-26 DIAGNOSIS — I25119 Atherosclerotic heart disease of native coronary artery with unspecified angina pectoris: Secondary | ICD-10-CM | POA: Diagnosis not present

## 2020-05-26 DIAGNOSIS — I200 Unstable angina: Secondary | ICD-10-CM | POA: Diagnosis present

## 2020-05-26 DIAGNOSIS — K219 Gastro-esophageal reflux disease without esophagitis: Secondary | ICD-10-CM | POA: Diagnosis present

## 2020-05-26 DIAGNOSIS — I209 Angina pectoris, unspecified: Secondary | ICD-10-CM | POA: Diagnosis not present

## 2020-05-26 DIAGNOSIS — R079 Chest pain, unspecified: Secondary | ICD-10-CM

## 2020-05-26 HISTORY — DX: Angina pectoris, unspecified: I20.9

## 2020-05-26 HISTORY — PX: LEFT HEART CATH AND CORONARY ANGIOGRAPHY: CATH118249

## 2020-05-26 LAB — CBC
HCT: 40.7 % (ref 39.0–52.0)
Hemoglobin: 13.8 g/dL (ref 13.0–17.0)
MCH: 31.2 pg (ref 26.0–34.0)
MCHC: 33.9 g/dL (ref 30.0–36.0)
MCV: 91.9 fL (ref 80.0–100.0)
Platelets: 211 10*3/uL (ref 150–400)
RBC: 4.43 MIL/uL (ref 4.22–5.81)
RDW: 12.9 % (ref 11.5–15.5)
WBC: 5.6 10*3/uL (ref 4.0–10.5)
nRBC: 0 % (ref 0.0–0.2)

## 2020-05-26 LAB — LIPID PANEL
Cholesterol: 143 mg/dL (ref 0–200)
HDL: 52 mg/dL (ref >40)
LDL Cholesterol: 63 mg/dL (ref 0–99)
Total CHOL/HDL Ratio: 2.8 RATIO
Triglycerides: 141 mg/dL (ref <150)
VLDL: 28 mg/dL (ref 0–40)

## 2020-05-26 LAB — ECHOCARDIOGRAM COMPLETE
AR max vel: 3.6 cm2
AV Area VTI: 4.73 cm2
AV Area mean vel: 4.05 cm2
AV Mean grad: 2 mmHg
AV Peak grad: 3.9 mmHg
Ao pk vel: 0.99 m/s
Area-P 1/2: 2.39 cm2
S' Lateral: 2.8 cm

## 2020-05-26 LAB — RESPIRATORY PANEL BY RT PCR (FLU A&B, COVID)
Influenza A by PCR: NEGATIVE
Influenza B by PCR: NEGATIVE
SARS Coronavirus 2 by RT PCR: NEGATIVE

## 2020-05-26 LAB — TROPONIN I (HIGH SENSITIVITY): Troponin I (High Sensitivity): 31 ng/L — ABNORMAL HIGH (ref <18)

## 2020-05-26 LAB — CREATININE, SERUM
Creatinine, Ser: 0.87 mg/dL (ref 0.61–1.24)
GFR, Estimated: >60 mL/min (ref >60)

## 2020-05-26 SURGERY — LEFT HEART CATH AND CORONARY ANGIOGRAPHY
Anesthesia: Moderate Sedation

## 2020-05-26 MED ORDER — SODIUM CHLORIDE 0.9 % IV SOLN: 250.0000 mL | INTRAVENOUS | Status: DC | PRN

## 2020-05-26 MED ORDER — ASPIRIN 81 MG PO CHEW
324.0000 mg | CHEWABLE_TABLET | Freq: Once | ORAL | Status: AC
  Administered 2020-05-26: 324 mg via ORAL
  Filled 2020-05-26: qty 4

## 2020-05-26 MED ORDER — CALCIUM CARBONATE-VITAMIN D 500-200 MG-UNIT PO TABS
1.0000 | ORAL_TABLET | Freq: Two times a day (BID) | ORAL | Status: DC
  Administered 2020-05-26 – 2020-05-27 (×2): 1 via ORAL
  Filled 2020-05-26 (×2): qty 1

## 2020-05-26 MED ORDER — FLUTICASONE PROPIONATE 50 MCG/ACT NA SUSP
1.0000 | Freq: Two times a day (BID) | NASAL | Status: DC
  Administered 2020-05-26: 1 via NASAL
  Filled 2020-05-26: qty 16

## 2020-05-26 MED ORDER — OMEGA-3-ACID ETHYL ESTERS 1 G PO CAPS
1.0000 g | ORAL_CAPSULE | Freq: Four times a day (QID) | ORAL | Status: DC
  Administered 2020-05-26 – 2020-05-27 (×3): 1 g via ORAL
  Filled 2020-05-26 (×2): qty 1

## 2020-05-26 MED ORDER — AZELASTINE HCL 0.1 % NA SOLN
1.0000 | Freq: Two times a day (BID) | NASAL | Status: DC
  Administered 2020-05-26 (×2): 1 via NASAL
  Filled 2020-05-26: qty 30

## 2020-05-26 MED ORDER — ADULT MULTIVITAMIN W/MINERALS CH
1.0000 | ORAL_TABLET | Freq: Every day | ORAL | Status: DC
  Administered 2020-05-27: 1 via ORAL
  Filled 2020-05-26: qty 1

## 2020-05-26 MED ORDER — VITAMIN D3 25 MCG (1000 UNIT) PO TABS
2000.0000 [IU] | ORAL_TABLET | Freq: Every day | ORAL | Status: DC
  Administered 2020-05-27: 2000 [IU] via ORAL
  Filled 2020-05-26 (×2): qty 2

## 2020-05-26 MED ORDER — SODIUM CHLORIDE 0.9 % WEIGHT BASED INFUSION: 1.0000 mL/kg/h | INTRAVENOUS | Status: AC

## 2020-05-26 MED ORDER — ACETAMINOPHEN 325 MG PO TABS: 650.0000 mg | ORAL_TABLET | ORAL | Status: DC | PRN

## 2020-05-26 MED ORDER — VITAMIN B-12 1000 MCG PO TABS
5000.0000 ug | ORAL_TABLET | Freq: Every day | ORAL | Status: DC
  Administered 2020-05-27: 5000 ug via ORAL
  Filled 2020-05-26: qty 5

## 2020-05-26 MED ORDER — MIDAZOLAM HCL 2 MG/2ML IJ SOLN
INTRAMUSCULAR | Status: DC | PRN
  Administered 2020-05-26: 1 mg via INTRAVENOUS

## 2020-05-26 MED ORDER — LABETALOL HCL 5 MG/ML IV SOLN: 10.0000 mg | INTRAVENOUS | Status: AC | PRN

## 2020-05-26 MED ORDER — MIDAZOLAM HCL 2 MG/2ML IJ SOLN
INTRAMUSCULAR | Status: AC
  Filled 2020-05-26: qty 2

## 2020-05-26 MED ORDER — HEPARIN (PORCINE) IN NACL 1000-0.9 UT/500ML-% IV SOLN
INTRAVENOUS | Status: AC
  Filled 2020-05-26: qty 500

## 2020-05-26 MED ORDER — ONDANSETRON HCL 4 MG/2ML IJ SOLN: 4.0000 mg | Freq: Four times a day (QID) | INTRAMUSCULAR | Status: DC | PRN

## 2020-05-26 MED ORDER — ASPIRIN 81 MG PO CHEW: 81.0000 mg | CHEWABLE_TABLET | ORAL | Status: DC

## 2020-05-26 MED ORDER — LIDOCAINE HCL (PF) 1 % IJ SOLN
INTRAMUSCULAR | Status: DC | PRN
  Administered 2020-05-26: 30 mL

## 2020-05-26 MED ORDER — FENTANYL CITRATE (PF) 100 MCG/2ML IJ SOLN
INTRAMUSCULAR | Status: AC
  Filled 2020-05-26: qty 2

## 2020-05-26 MED ORDER — FENTANYL CITRATE (PF) 100 MCG/2ML IJ SOLN
INTRAMUSCULAR | Status: DC | PRN
  Administered 2020-05-26: 50 ug via INTRAVENOUS

## 2020-05-26 MED ORDER — ALFUZOSIN HCL ER 10 MG PO TB24
10.0000 mg | ORAL_TABLET | Freq: Every day | ORAL | Status: DC
  Administered 2020-05-26: 10 mg via ORAL
  Filled 2020-05-26 (×2): qty 1

## 2020-05-26 MED ORDER — ENOXAPARIN SODIUM 40 MG/0.4ML ~~LOC~~ SOLN: 40.0000 mg | SUBCUTANEOUS | Status: DC

## 2020-05-26 MED ORDER — SODIUM CHLORIDE 0.9% FLUSH: 3.0000 mL | INTRAVENOUS | Status: DC | PRN

## 2020-05-26 MED ORDER — HYDRALAZINE HCL 20 MG/ML IJ SOLN: 10.0000 mg | INTRAMUSCULAR | Status: AC | PRN

## 2020-05-26 MED ORDER — IOHEXOL 300 MG/ML  SOLN
INTRAMUSCULAR | Status: DC | PRN
  Administered 2020-05-26: 90 mL

## 2020-05-26 MED ORDER — SODIUM CHLORIDE 0.9 % WEIGHT BASED INFUSION: 1.0000 mL/kg/h | INTRAVENOUS | Status: DC

## 2020-05-26 MED ORDER — SODIUM CHLORIDE 0.9 % IV SOLN: INTRAVENOUS | Status: DC

## 2020-05-26 MED ORDER — HEPARIN (PORCINE) IN NACL 1000-0.9 UT/500ML-% IV SOLN
INTRAVENOUS | Status: DC | PRN
  Administered 2020-05-26: 500 mL

## 2020-05-26 MED ORDER — ENOXAPARIN SODIUM 60 MG/0.6ML ~~LOC~~ SOLN
0.5000 mg/kg | SUBCUTANEOUS | Status: DC
  Administered 2020-05-27: 45 mg via SUBCUTANEOUS
  Filled 2020-05-26: qty 0.6

## 2020-05-26 MED ORDER — METOPROLOL SUCCINATE ER 25 MG PO TB24
25.0000 mg | ORAL_TABLET | Freq: Every day | ORAL | Status: DC
  Administered 2020-05-26 – 2020-05-27 (×2): 25 mg via ORAL
  Filled 2020-05-26 (×2): qty 1

## 2020-05-26 MED ORDER — FINASTERIDE 5 MG PO TABS
5.0000 mg | ORAL_TABLET | Freq: Every day | ORAL | Status: DC
  Administered 2020-05-26: 5 mg via ORAL
  Filled 2020-05-26: qty 1

## 2020-05-26 MED ORDER — ROSUVASTATIN CALCIUM 5 MG PO TABS
5.0000 mg | ORAL_TABLET | Freq: Every day | ORAL | Status: DC
  Filled 2020-05-26: qty 1

## 2020-05-26 MED ORDER — SODIUM CHLORIDE 0.9% FLUSH
3.0000 mL | Freq: Two times a day (BID) | INTRAVENOUS | Status: DC
  Administered 2020-05-26 (×2): 3 mL via INTRAVENOUS

## 2020-05-26 MED ORDER — SODIUM CHLORIDE 0.9 % WEIGHT BASED INFUSION: 3.0000 mL/kg/h | INTRAVENOUS | Status: DC

## 2020-05-26 MED ORDER — ICOSAPENT ETHYL 1 G PO CAPS
1.0000 g | ORAL_CAPSULE | Freq: Four times a day (QID) | ORAL | Status: DC
  Filled 2020-05-26 (×4): qty 1

## 2020-05-26 MED ORDER — LIDOCAINE HCL (PF) 1 % IJ SOLN
INTRAMUSCULAR | Status: AC
  Filled 2020-05-26: qty 30

## 2020-05-26 SURGICAL SUPPLY — 10 items
CATH INFINITI 5FR ANG PIGTAIL (CATHETERS) ×2 IMPLANT
CATH INFINITI 5FR JL4 (CATHETERS) ×2 IMPLANT
CATH INFINITI JR4 5F (CATHETERS) ×2 IMPLANT
DEVICE CLOSURE MYNXGRIP 5F (Vascular Products) ×2 IMPLANT
KIT LHK - MID 3VCOMP MANIF (MISCELLANEOUS) ×2 IMPLANT
NEEDLE PERC 18GX7CM (NEEDLE) ×2 IMPLANT
PACK CARDIAC CATH (CUSTOM PROCEDURE TRAY) ×2 IMPLANT
SHEATH AVANTI 5FR X 11CM (SHEATH) ×2 IMPLANT
TUBING CIL FLEX 10 FLL-RA (TUBING) ×2 IMPLANT
WIRE GUIDERIGHT .035X150 (WIRE) ×2 IMPLANT

## 2020-05-26 NOTE — ED Provider Notes (Signed)
[facility] Emergency Department Provider Note   ____________________________________________   First MD Initiated Contact with Patient 05/26/20 0510     (approximate)  I have reviewed the triage vital signs and the nursing notes.   HISTORY  Chief Complaint Shortness of Breath and Chest Pain    HPI Clayton Powell is a 74 y.o. male history of coronary disease based on calcium scoring Hypertension, hyperlipidemia  Patient reports for about a month that he is noticed when he walks especially to his car to start getting a chest discomfort in his lower chest.  Goes away after resting.  Seems to be not getting away and seems to be almost worsening at times always occurring with walking and going away after resting.  No fevers or chills.  No recent illness.  Called his primary doctor who recommended he get evaluated.  Went to urgent care and there he reports he had an elevated troponin.  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  Symptoms will recur if he walks distances.  No associated abdominal symptoms such as nausea vomiting.  No diarrhea.  Past Medical History:  Diagnosis Date  . Allergy   . BPH (benign prostatic hypertrophy) 6/07  . CAD (coronary artery disease) 2010   dx made by coronary calcium scoring, Normal stress test in 2010.   Marland Kitchen GERD (gastroesophageal reflux disease) 12/05  . HLD (hyperlipidemia) 3/99  . HTN (hypertension) 2002   no meds  . Left rotator cuff tear 08/12/2011   Per Dr. Mardelle Matte 2013 L rotator cuff tear on MRI 2013   . Right rotator cuff tear 05/24/2013  . Wears glasses     Patient Active Problem List   Diagnosis Date Noted  . Chest pain 05/26/2020  . Hand numbness 07/15/2018  . Impacted cerumen of both ears 04/23/2018  . Acute gastroenteritis 04/12/2017  . Left knee pain 09/13/2016  . Tympanic membrane perforation, marginal, right 09/02/2016  . 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 Ear  fullness, bilateral 05/24/2016  . 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 Dysfunction of both eustachian tubes 12/02/2015  . ETD (eustachian tube dysfunction) 09/24/2015  . Adjustment disorder 10/27/2014  . Advance care planning 10/26/2014  . Right rotator cuff tear 05/24/2013  . Benign prostatic hyperplasia with urinary obstruction 05/18/2012  . Abnormal prostate specific antigen 05/16/2012  . 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 Incomplete bladder emptying 05/16/2012  . Murmur 10/21/2011  . Routine general medical examination at a health care facility 08/12/2011  . Left rotator cuff tear 08/12/2011  . RAYNAUD'S SYNDROME 08/10/2010  . CAD, NATIVE VESSEL 03/16/2009  . UNSPECIFIED HEART DISEASE 03/12/2009  . METABOLIC SYNDROME X 40/04/2724  . GERD 02/18/2007  . ALLERGIC RHINITIS, CHRONIC 02/16/2007  . PROSTATITIS, CHRONIC 02/16/2007    Past Surgical History:  Procedure Laterality Date  . admitted to [facility] chest pain  12/15/05   Dr. Clayborn Bigness  . BLEPHAROPLASTY  10/11   Dr. Loletta Specter   . bone spur removed Right   . COLONOSCOPY  09/24/04   nml; Dr. Epifanio Lesches   . EGD esophagitis by bx  01/26/06   gastritis bx neg h-pylori  . POLYPECTOMY    . right and left prostate needle bx  05/02/03   acute inflammation; Dr. Gaynelle Arabian   . SHOULDER ARTHROSCOPY W/ ROTATOR CUFF REPAIR  12/2011   left  . SHOULDER ARTHROSCOPY WITH ROTATOR CUFF REPAIR AND SUBACROMIAL DECOMPRESSION Right 05/24/2013  Procedure: RIGHT SHOULDER ARTHROSCOPY WITH ROTATOR CUFF REPAIR AND SUBACROMIAL DECOMPRESSION AND PARTIAL ACROMIOPLSTY WITH CORACOACROMIAL RELEASE, DISTAL CLAVICULECTOMY, LABRIAL DEBRIDEMENT;  Surgeon: Johnny Bridge, MD;  Location: [place];  Service: Orthopedics;  Laterality: Right;  . spect ETT nml  12/16/05  . UPPER GASTROINTESTINAL ENDOSCOPY    . WISDOM TOOTH EXTRACTION      Prior to Admission medications   Medication Sig Start Date End Date  Taking? Authorizing Provider  alfuzosin (UROXATRAL) 10 MG 24 hr tablet Take 1 tablet (10 mg total) by mouth daily with breakfast. 04/09/20   Billey Co, MD  aspirin 81 MG EC tablet Take 81 mg by mouth daily.      [provider]  Azelastine-Fluticasone 137-50 MCG/ACT SUSP Place 1 spray into both nostrils 2 (two) times daily. 12/16/19   [provider]  CALCIUM PO Take 1,200 mg by mouth daily.    [provider]  Cholecalciferol (VITAMIN D) 2000 UNITS CAPS Take 1 capsule by mouth daily.      [provider]  clindamycin (CLEOCIN T) 1 % external solution Apply topically 2 (two) times daily as needed. 01/09/20   [provider]  Cyanocobalamin (VITAMIN B-12) 5000 MCG SUBL Place 1 tablet under the tongue daily.    [provider]  fexofenadine (ALLEGRA) 180 MG tablet Take 180 mg by mouth daily.    [provider]  finasteride (PROSCAR) 5 MG tablet Take 1 tablet (5 mg total) by mouth daily. 04/09/20   Billey Co, MD  ibuprofen (ADVIL,MOTRIN) 200 MG tablet Take 600 mg by mouth daily as needed for moderate pain.     [provider]  Icosapent Ethyl (VASCEPA) 1 G CAPS Take 1 tablet by mouth 4 (four) times daily.    [provider]  montelukast (SINGULAIR) 10 MG tablet Take 10 mg by mouth at bedtime.    [provider]  Multiple Vitamin (MULTIVITAMIN) tablet Take 1 tablet by mouth daily.      [provider]  rosuvastatin (CRESTOR) 5 MG tablet Take 5 mg by mouth daily.    [provider]  SYMBICORT 160-4.5 MCG/ACT inhaler SMARTSIG:2 Puff(s) By Mouth Twice Daily 02/10/20   [provider]  tadalafil (CIALIS) 5 MG tablet Take 1 tablet (5 mg total) by mouth daily. 04/09/20   Billey Co, MD    Allergies Ciprofloxacin hcl and Quinolones  Family History  Problem Relation Age of Onset  . Stroke Mother   . Glaucoma Other        grandmother at 69  . Barrett's esophagus Maternal  Uncle   . Prostate cancer Neg Hx   . Colon cancer Neg Hx   . Colon polyps Neg Hx   . Esophageal cancer Neg Hx   . Rectal cancer Neg Hx   . Stomach cancer Neg Hx     Social History Social History   Tobacco Use  . Smoking status: Never Smoker  . Smokeless tobacco: Never Used  Substance Use Topics  . Alcohol use: Yes    Alcohol/week: 0.0 standard drinks    Comment: beer, occ  . Drug use: No    Review of Systems Constitutional: No fever/chills Eyes: No visual changes. ENT: No sore throat. Cardiovascular: See HPI.  Chest discomfort does not radiate comes about with exertion Respiratory: Denies shortness of breath. Gastrointestinal: No abdominal pain.   Genitourinary: Negative for dysuria. Musculoskeletal: Negative for back pain. Skin: Negative for rash. Neurological: Negative for headaches, areas  of focal weakness or numbness.    ____________________________________________   PHYSICAL EXAM:  VITAL SIGNS: ED Triage Vitals  Enc Vitals Group     BP 05/25/20 1909 (!) 148/82     Pulse Rate 05/25/20 1909 62     Resp 05/25/20 1909 20     Temp 05/25/20 1909 99.1 F (37.3 C)     Temp Source 05/25/20 2313 Oral     SpO2 05/25/20 1909 96 %     Weight 05/25/20 1906 200 lb (90.7 kg)     Height 05/25/20 1906 5\' 6"  (1.676 m)     Head Circumference --      Peak Flow --      Pain Score 05/25/20 1906 0     Pain Loc --      Pain Edu? --      Excl. in [place]? --     Constitutional: Alert and oriented. Well appearing and in no acute distress. Eyes: Conjunctivae are normal. Head: Atraumatic. Nose: No congestion/rhinnorhea. Mouth/Throat: Mucous membranes are moist. Neck: No stridor.  Cardiovascular: Normal rate, regular rhythm. Grossly normal heart sounds.  Good peripheral circulation. Respiratory: Normal respiratory effort.  No retractions. Lungs CTAB. Gastrointestinal: Soft and nontender. No distention. Musculoskeletal: No lower extremity tenderness nor edema. Neurologic:   Normal speech and language. No gross focal neurologic deficits are appreciated.  Skin:  Skin is warm, dry and intact. No rash noted. Psychiatric: Mood and affect are normal. Speech and behavior are normal.  ____________________________________________   LABS (all labs ordered are listed, but only abnormal results are displayed)  Labs Reviewed  TROPONIN I (HIGH SENSITIVITY) - Abnormal; Notable for the following components:      Result Value   Troponin I (High Sensitivity) 35 (*)    All other components within normal limits  RESPIRATORY PANEL BY RT PCR (FLU A&B, COVID)  BRAIN NATRIURETIC PEPTIDE  TROPONIN I (HIGH SENSITIVITY)   ____________________________________________  EKG  ED ECG REPORT I, Delman Kitten, the attending physician, personally viewed and interpreted this ECG.  Date: 05/26/2020 EKG Time: 1910 Rate: 70 Rhythm: normal sinus rhythm QRS Axis: normal Intervals: normal ST/T Wave abnormalities: normal Narrative Interpretation: no evidence of acute ischemia  ____________________________________________  RADIOLOGY  DG Chest 2 View  Result Date: 05/25/2020 CLINICAL DATA:  Chest pain EXAM: CHEST - 2 VIEW COMPARISON:  Chest x-ray 12/15/2005 FINDINGS: The heart size and mediastinal contours are within normal limits. Left base compressive atelectasis. No focal consolidation. No pulmonary edema. Blunting of bilateral costophrenic angles with possible trace pleural effusions bilaterally. No pneumothorax. No acute osseous abnormality. Multilevel degenerative changes of the spine. IMPRESSION: 1. Blunting of bilateral costophrenic angles with possible trace pleural effusions bilaterally. 2. Otherwise no acute cardiopulmonary abnormality. Electronically Signed   By: Iven Finn M.D.   On: 05/25/2020 19:52    X-ray personally viewed by me, very tiny suspected trace pleural effusions. ____________________________________________   PROCEDURES  Procedure(s) performed:  None  Procedures  Critical Care performed: No  ____________________________________________   INITIAL IMPRESSION / ASSESSMENT AND PLAN / ED COURSE  Pertinent labs & imaging results that were available during my care of the patient were reviewed by me and considered in my medical decision making (see chart for details).   Differential diagnosis includes, but is not limited to, ACS, aortic dissection, pulmonary embolism, cardiac tamponade, pneumothorax, pneumonia, pericarditis, myocarditis, GI-related causes including esophagitis/gastritis, and musculoskeletal chest wall pain.    HEAR Score: 5   Based on the patient's symptoms of concerning potential  anginal-like equivalent and known history of coronary disease or at least suspected based on calcium scoring in the past and his mildly elevated troponins discussed with the patient will admit for further work-up including anticipated consultation with cardiology.  Patient understanding comfortable agreeable with plan.  He is currently pain and symptom-free.  Currently no evidence on ECG or by symptoms of ongoing ACS though I do have elevated concern due to his elevated troponins have a potential cardiac pathology  BNP added Discussed with hospitalist Dr. Damita Dunnings     ____________________________________________   FINAL CLINICAL IMPRESSION(S) / ED DIAGNOSES  Final diagnoses:  Chest pain, moderate coronary artery risk        Note:  This document was prepared using Dragon voice recognition software and may include unintentional dictation errors       Delman Kitten, MD 05/26/20 [phone]

## 2020-05-26 NOTE — Consult Note (Signed)
[facility] Consultation Note  Patient ID: Clayton Powell, MRN: [id number], DOB/AGE: 1946-03-12 74 y.o. Admit date: 05/26/2020   Date of Consult: 05/26/2020 Primary Physician: Tonia Ghent, MD Primary Cardiologist: None  Chief Complaint:  Chief Complaint  Patient presents with  . Shortness of Breath  . Chest Pain   Reason for Consult: Chest pain  HPI: 74 y.o. male with severe hyperlipidemia and coronary artery atherosclerosis by CT scan in the past having concerns of chest discomfort when walking doing other activities including golfing.  This is substernal radiating into the upper neck and upper chest and a burning sensation.  This is relieved by rest and has been more significant and progressive over the last several weeks.  The patient has been on appropriate antilipid medication management but is not had any other significant concerns or risk factors.  Currently when he was seen in the emergency room he had an EKG showing normal sinus rhythm with a troponin of 38.  This is most consistent with unstable angina.  Currently he is stable hemodynamically with no further chest distant comfort or issues at rest  Past Medical History:  Diagnosis Date  . Allergy   . BPH (benign prostatic hypertrophy) 6/07  . CAD (coronary artery disease) 2010   dx made by coronary calcium scoring, Normal stress test in 2010.   Marland Kitchen GERD (gastroesophageal reflux disease) 12/05  . HLD (hyperlipidemia) 3/99  . HTN (hypertension) 2002   no meds  . Left rotator cuff tear 08/12/2011   Per Dr. Mardelle Matte 2013 L rotator cuff tear on MRI 2013   . Right rotator cuff tear 05/24/2013  . Wears glasses       Surgical History:  Past Surgical History:  Procedure Laterality Date  . admitted to [facility] chest pain  12/15/05   Dr. Clayborn Bigness  . BLEPHAROPLASTY  10/11   Dr. Loletta Specter   . bone spur removed Right   . COLONOSCOPY  09/24/04   nml; Dr. Epifanio Lesches   . EGD esophagitis by bx  01/26/06   gastritis bx neg  h-pylori  . POLYPECTOMY    . right and left prostate needle bx  05/02/03   acute inflammation; Dr. Gaynelle Arabian   . SHOULDER ARTHROSCOPY W/ ROTATOR CUFF REPAIR  12/2011   left  . SHOULDER ARTHROSCOPY WITH ROTATOR CUFF REPAIR AND SUBACROMIAL DECOMPRESSION Right 05/24/2013   Procedure: RIGHT SHOULDER ARTHROSCOPY WITH ROTATOR CUFF REPAIR AND SUBACROMIAL DECOMPRESSION AND PARTIAL ACROMIOPLSTY WITH CORACOACROMIAL RELEASE, DISTAL CLAVICULECTOMY, LABRIAL DEBRIDEMENT;  Surgeon: Johnny Bridge, MD;  Location: [facility];  Service: Orthopedics;  Laterality: Right;  . spect ETT nml  12/16/05  . UPPER GASTROINTESTINAL ENDOSCOPY    . WISDOM TOOTH EXTRACTION       Home Meds: Prior to Admission medications   Medication Sig Start Date End Date Taking? Authorizing Provider  alfuzosin (UROXATRAL) 10 MG 24 hr tablet Take 1 tablet (10 mg total) by mouth daily with breakfast. 04/09/20  Yes Billey Co, MD  aspirin 81 MG EC tablet Take 81 mg by mouth daily.     Yes [provider]  Azelastine-Fluticasone 137-50 MCG/ACT SUSP Place 1 spray into both nostrils 2 (two) times daily. 12/16/19  Yes [provider]  calcium citrate-vitamin D (CITRACAL+D) 315-200 MG-UNIT tablet Take 1-2 tablets by mouth 2 (two) times daily.   Yes [provider]  Cholecalciferol (VITAMIN D) 2000 UNITS CAPS Take 1 capsule by mouth daily.     Yes [provider]  clindamycin (CLEOCIN T) 1 % external solution Apply topically 2 (two) times daily as needed. 01/09/20  Yes [provider]  Cyanocobalamin (VITAMIN B-12) 5000 MCG SUBL Place 1 tablet under the tongue daily.   Yes [provider]  finasteride (PROSCAR) 5 MG tablet Take 1 tablet (5 mg total) by mouth daily. 04/09/20  Yes Billey Co, MD  ibuprofen (ADVIL,MOTRIN) 200 MG tablet Take 600 mg by mouth daily as needed for moderate pain.    Yes [provider]  Icosapent Ethyl (VASCEPA) 1 G CAPS Take 1 tablet by  mouth 4 (four) times daily.   Yes [provider]  Multiple Vitamin (MULTIVITAMIN) tablet Take 1 tablet by mouth daily.     Yes [provider]  rosuvastatin (CRESTOR) 5 MG tablet Take 5 mg by mouth daily.   Yes [provider]  tadalafil (CIALIS) 5 MG tablet Take 1 tablet (5 mg total) by mouth daily. 04/09/20  Yes Billey Co, MD    Inpatient Medications:  . betamethasone acetate-betamethasone sodium phosphate  3 mg Intramuscular Once  . [START ON 05/27/2020] enoxaparin (LOVENOX) injection  0.5 mg/kg Subcutaneous Q24H   . sodium chloride      Allergies:  Allergies  Allergen Reactions  . Ciprofloxacin Hcl Rash  . Quinolones Rash    Social History   Socioeconomic History  . Marital status: Married    Spouse name: Not on file  . Number of children: Not on file  . Years of education: Not on file  . Highest education level: Not on file  Occupational History  . Not on file  Tobacco Use  . Smoking status: Never Smoker  . Smokeless tobacco: Never Used  Substance and Sexual Activity  . Alcohol use: Yes    Alcohol/week: 0.0 standard drinks    Comment: beer, occ  . Drug use: No  . Sexual activity: Not on file  Other Topics Concern  . 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    Plays guitar   Works out regularly.   Social Determinants of Health   Financial Resource Strain:   . Difficulty of Paying Living Expenses: Not on file  Food Insecurity:   . Worried About Charity fundraiser in the Last Year: Not on file  . Ran Out of Food in the Last Year: Not on file  Transportation Needs:   . Lack of Transportation (Medical): Not on file  . Lack of Transportation (Non-Medical): Not on file  Physical Activity:   . Days of Exercise per Week: Not on file  . Minutes of Exercise per Session: Not on file  Stress:   . Feeling of Stress : Not on file   Social Connections:   . Frequency of Communication with Friends and Family: Not on file  . Frequency of Social Gatherings with Friends and Family: Not on file  . Attends Religious Services: Not on file  . Active Member of Clubs or Organizations: Not on file  . Attends Archivist Meetings: Not on file  . Marital Status: Not on file  Intimate Partner Violence:   . Fear of Current or Ex-Partner: Not on file  . Emotionally Abused: Not on file  . Physically Abused: Not on file  . Sexually Abused: Not on file     Family History  Problem Relation Age of Onset  .  Stroke Mother   . Glaucoma Other        grandmother at 24  . Barrett's esophagus Maternal Uncle   . Prostate cancer Neg Hx   . Colon cancer Neg Hx   . Colon polyps Neg Hx   . Esophageal cancer Neg Hx   . Rectal cancer Neg Hx   . Stomach cancer Neg Hx      Review of Systems Positive for chest pain Negative for: General:  chills, fever, night sweats or weight changes.  Cardiovascular: PND orthopnea syncope dizziness  Dermatological skin lesions rashes Respiratory: Cough congestion Urologic: Frequent urination urination at night and hematuria Abdominal: negative for nausea, vomiting, diarrhea, bright red blood per rectum, melena, or hematemesis Neurologic: negative for visual changes, and/or hearing changes  All other systems reviewed and are otherwise negative except as noted above.  Labs: No results for input(s): CKTOTAL, CKMB, TROPONINI in the last 72 hours. Lab Results  Component Value Date   WBC 5.6 05/26/2020   HGB 13.8 05/26/2020   HCT 40.7 05/26/2020   MCV 91.9 05/26/2020   PLT 211 05/26/2020    Recent Labs  Lab 05/25/20 1715 05/25/20 1715 05/26/20 0615  NA 136  --   --   K 4.1  --   --   CL 100  --   --   CO2 27  --   --   BUN 21  --   --   CREATININE 0.93   < > 0.87  CALCIUM 8.8*  --   --   PROT 7.1  --   --   BILITOT 0.8  --   --   ALKPHOS 73  --   --   ALT 29  --   --   AST 34   --   --   GLUCOSE 90  --   --    < > = values in this interval not displayed.   Lab Results  Component Value Date   CHOL 143 05/26/2020   HDL 52 05/26/2020   LDLCALC 63 05/26/2020   TRIG 141 05/26/2020   No results found for: DDIMER  Radiology/Studies:  DG Chest 2 View  Result Date: 05/25/2020 CLINICAL DATA:  Chest pain EXAM: CHEST - 2 VIEW COMPARISON:  Chest x-ray 12/15/2005 FINDINGS: The heart size and mediastinal contours are within normal limits. Left base compressive atelectasis. No focal consolidation. No pulmonary edema. Blunting of bilateral costophrenic angles with possible trace pleural effusions bilaterally. No pneumothorax. No acute osseous abnormality. Multilevel degenerative changes of the spine. IMPRESSION: 1. Blunting of bilateral costophrenic angles with possible trace pleural effusions bilaterally. 2. Otherwise no acute cardiopulmonary abnormality. Electronically Signed   By: Iven Finn M.D.   On: 05/25/2020 19:52    EKG: Normal sinus rhythm with nonspecific ST changes  Weights: Filed Weights   05/25/20 1906  Weight: 90.7 kg     Physical Exam: Blood pressure (!) 178/85, pulse (!) 49, temperature 98 F (36.7 C), temperature source Oral, resp. rate 18, height 5\' 6"  (1.676 m), weight 90.7 kg, SpO2 96 %. Body mass index is 32.28 kg/m. General: Well developed, well nourished, in no acute distress. Head eyes ears nose throat: Normocephalic, atraumatic, sclera non-icteric, no xanthomas, nares are without discharge. No apparent thyromegaly and/or mass  Lungs: Normal respiratory effort.  no wheezes, no rales, no rhonchi.  Heart: RRR with normal S1 S2. no murmur gallop, no rub, PMI is normal size and placement, carotid upstroke normal without bruit, jugular venous pressure  is normal Abdomen: Soft, non-tender, non-distended with normoactive bowel sounds. No hepatomegaly. No rebound/guarding. No obvious abdominal masses. Abdominal aorta is normal size without  bruit Extremities: No edema. no cyanosis, no clubbing, no ulcers  Peripheral : 2+ bilateral upper extremity pulses, 2+ bilateral femoral pulses, 2+ bilateral dorsal pedal pulse Neuro: Alert and oriented. No facial asymmetry. No focal deficit. Moves all extremities spontaneously. Musculoskeletal: Normal muscle tone without kyphosis Psych:  Responds to questions appropriately with a normal affect.    Assessment: 74 year old male with known cardiovascular disease by previous CT scan with significant hyperlipidemia now having unstable angina with a slightly abnormal EKG  Plan: 1.  Heparin for further risk reduction in cardiovascular disease and/or possible myocardial infarction in addition to aspirin 81 mg 2.  Proceed to cardiac catheterization to assess coronary anatomy and further treatment thereof is necessary.  Patient understands the risk and benefits of cardiac catheterization.  This includes the possibility of death stroke heart attack infection bleeding or blood clot.  Patient is at low risk for conscious sedation  Signed, Corey Skains M.D. [facility] 05/26/2020, 8:57 AM

## 2020-05-26 NOTE — Progress Notes (Signed)
*  PRELIMINARY RESULTS* Echocardiogram 2D Echocardiogram has been performed.  Clayton Powell 05/26/2020, 12:56 PM

## 2020-05-26 NOTE — Progress Notes (Signed)
PHARMACIST - PHYSICIAN COMMUNICATION  CONCERNING:  Enoxaparin (Lovenox) for DVT Prophylaxis    RECOMMENDATION: Patient was prescribed enoxaprin 40mg  q24 hours for VTE prophylaxis.   Filed Weights   05/25/20 1906  Weight: 90.7 kg (200 lb)    Body mass index is 32.28 kg/m.  Estimated Creatinine Clearance: 79.8 mL/min (by C-G formula based on SCr of 0.87 mg/dL).   Based on [place] patient is candidate for enoxaparin 0.5mg /kg TBW SQ every 24 hours based on BMI being >30.   DESCRIPTION: Pharmacy has adjusted enoxaparin dose per [facility] policy.  Patient is now receiving enoxaparin 45 mg every 24 hours    Lu Duffel, PharmD, BCPS Clinical Pharmacist 05/26/2020 8:05 AM

## 2020-05-26 NOTE — H&P (Signed)
History and Physical    Clayton Powell AYT:[id number] DOB: 28-May-1946 DOA: 05/26/2020  PCP: Tonia Ghent, MD   Patient coming from: Home  I have personally briefly reviewed patient's old medical records in [place]  Chief Complaint: Chest pain  HPI: Clayton Powell is a 74 y.o. male with medical history significant for coronary artery disease (diagnosis made by coronary calcium scoring), history of dyslipidemia, hypertension and BPH who presents to the ER for evaluation of chest pain.  Pain is mostly substernal and occurs with exertion but is relieved at rest and has been progressive and more significant over the last couple of weeks.  He denies having any nausea, no vomiting, no diaphoresis, no palpitations with this pain.  He has had no fever or chills, no cough, no dizziness or lightheadedness. He had a twelve-lead EKG which showed no acute findings but his troponin was slightly elevated at 38.  Patient initially thought his symptoms are related to reflux and has been taking omeprazole and Tums without any improvement. Labs show sodium 136, potassium 4.1, chloride 100, bicarb 27, glucose 90, BUN 21, creatinine 0.93, calcium 8.8, alkaline phosphatase 73, albumin 4.4, AST 34, ALT 29, total protein 7.1, troponin 38 >>35>.31, total cholesterol 143, HDL 52, LDL 63, white count 5.6, hemoglobin 13.8, hematocrit 40.7, MCV 91.9, RDW 12.9, platelet count 211 Chest x-ray reviewed by me shows no acute cardiopulmonary disease. Twelve-lead EKG reviewed by me shows normal sinus rhythm with no acute ST-T wave changes.    ED Course: Patient is a 74 year old Caucasian male with a history of hypertension, GERD, dyslipidemia, coronary artery disease who presents to the ER for evaluation of chest pain mostly substernal and worse with exertion.  He has slightly elevated troponin levels and will be admitted to the hospital for further evaluation.  Review of Systems: As per HPI otherwise 10 point  review of systems negative.    Past Medical History:  Diagnosis Date  . Allergy   . BPH (benign prostatic hypertrophy) 6/07  . CAD (coronary artery disease) 2010   dx made by coronary calcium scoring, Normal stress test in 2010.   Marland Kitchen GERD (gastroesophageal reflux disease) 12/05  . HLD (hyperlipidemia) 3/99  . HTN (hypertension) 2002   no meds  . Left rotator cuff tear 08/12/2011   Per Dr. Mardelle Matte 2013 L rotator cuff tear on MRI 2013   . Right rotator cuff tear 05/24/2013  . Wears glasses     Past Surgical History:  Procedure Laterality Date  . admitted to [facility] chest pain  12/15/05   Dr. Clayborn Bigness  . BLEPHAROPLASTY  10/11   Dr. Loletta Specter   . bone spur removed Right   . COLONOSCOPY  09/24/04   nml; Dr. Epifanio Lesches   . EGD esophagitis by bx  01/26/06   gastritis bx neg h-pylori  . POLYPECTOMY    . right and left prostate needle bx  05/02/03   acute inflammation; Dr. Gaynelle Arabian   . SHOULDER ARTHROSCOPY W/ ROTATOR CUFF REPAIR  12/2011   left  . SHOULDER ARTHROSCOPY WITH ROTATOR CUFF REPAIR AND SUBACROMIAL DECOMPRESSION Right 05/24/2013   Procedure: RIGHT SHOULDER ARTHROSCOPY WITH ROTATOR CUFF REPAIR AND SUBACROMIAL DECOMPRESSION AND PARTIAL ACROMIOPLSTY WITH CORACOACROMIAL RELEASE, DISTAL CLAVICULECTOMY, LABRIAL DEBRIDEMENT;  Surgeon: Johnny Bridge, MD;  Location: [facility];  Service: Orthopedics;  Laterality: Right;  . spect ETT nml  12/16/05  . UPPER GASTROINTESTINAL ENDOSCOPY    . WISDOM TOOTH EXTRACTION  reports that he has never smoked. He has never used smokeless tobacco. He reports current alcohol use. He reports that he does not use drugs.  Allergies  Allergen Reactions  . Ciprofloxacin Hcl Rash  . Quinolones Rash    Family History  Problem Relation Age of Onset  . Stroke Mother   . Glaucoma Other        grandmother at 22  . Barrett's esophagus Maternal Uncle   . Prostate cancer Neg Hx   . Colon cancer Neg Hx   . Colon polyps Neg Hx   . Esophageal  cancer Neg Hx   . Rectal cancer Neg Hx   . Stomach cancer Neg Hx      Prior to Admission medications   Medication Sig Start Date End Date Taking? Authorizing Provider  alfuzosin (UROXATRAL) 10 MG 24 hr tablet Take 1 tablet (10 mg total) by mouth daily with breakfast. 04/09/20  Yes Billey Co, MD  aspirin 81 MG EC tablet Take 81 mg by mouth daily.     Yes [provider]  Azelastine-Fluticasone 137-50 MCG/ACT SUSP Place 1 spray into both nostrils 2 (two) times daily. 12/16/19  Yes [provider]  calcium citrate-vitamin D (CITRACAL+D) 315-200 MG-UNIT tablet Take 1-2 tablets by mouth 2 (two) times daily.   Yes [provider]  Cholecalciferol (VITAMIN D) 2000 UNITS CAPS Take 1 capsule by mouth daily.     Yes [provider]  clindamycin (CLEOCIN T) 1 % external solution Apply topically 2 (two) times daily as needed. 01/09/20  Yes [provider]  Cyanocobalamin (VITAMIN B-12) 5000 MCG SUBL Place 1 tablet under the tongue daily.   Yes [provider]  finasteride (PROSCAR) 5 MG tablet Take 1 tablet (5 mg total) by mouth daily. 04/09/20  Yes Billey Co, MD  ibuprofen (ADVIL,MOTRIN) 200 MG tablet Take 600 mg by mouth daily as needed for moderate pain.    Yes [provider]  Icosapent Ethyl (VASCEPA) 1 G CAPS Take 1 tablet by mouth 4 (four) times daily.   Yes [provider]  Multiple Vitamin (MULTIVITAMIN) tablet Take 1 tablet by mouth daily.     Yes [provider]  rosuvastatin (CRESTOR) 5 MG tablet Take 5 mg by mouth daily.   Yes [provider]  tadalafil (CIALIS) 5 MG tablet Take 1 tablet (5 mg total) by mouth daily. 04/09/20  Yes Billey Co, MD    Physical Exam: Vitals:   05/26/20 0500 05/26/20 0600 05/26/20 0630 05/26/20 0800  BP: (!) 186/83 (!) 177/95 (!) 172/74 (!) 178/85  Pulse: 65 (!) 56 (!) 51 (!) 49  Resp: 13 (!) 24 13 18   Temp:      TempSrc:      SpO2: 95% 94% 93% 96%   Weight:      Height:         Vitals:   05/26/20 0500 05/26/20 0600 05/26/20 0630 05/26/20 0800  BP: (!) 186/83 (!) 177/95 (!) 172/74 (!) 178/85  Pulse: 65 (!) 56 (!) 51 (!) 49  Resp: 13 (!) 24 13 18   Temp:      TempSrc:      SpO2: 95% 94% 93% 96%  Weight:      Height:        Constitutional: NAD, alert and oriented x 3 Eyes: PERRL, lids and conjunctivae normal ENMT: Mucous membranes are moist.  Neck: normal, supple, no masses, no thyromegaly Respiratory: clear to auscultation bilaterally, no wheezing, no crackles.  Normal respiratory effort. No accessory muscle use.  Cardiovascular: Regular rate and rhythm, no murmurs / rubs / gallops. No extremity edema. 2+ pedal pulses. No carotid bruits.  Abdomen: no tenderness, no masses palpated. No hepatosplenomegaly. Bowel sounds positive.  Musculoskeletal: no clubbing / cyanosis. No joint deformity upper and lower extremities.  Skin: no rashes, lesions, ulcers.  Neurologic: No gross focal neurologic deficit. Psychiatric: Normal mood and affect.   Labs on Admission: I have personally reviewed following labs and imaging studies  CBC: Recent Labs  Lab 05/25/20 1715 05/26/20 0615  WBC 7.8 5.6  NEUTROABS 4.6  --   HGB 13.9 13.8  HCT 41.8 40.7  MCV 92.1 91.9  PLT 229 542   Basic Metabolic Panel: Recent Labs  Lab 05/25/20 1715 05/26/20 0615  NA 136  --   K 4.1  --   CL 100  --   CO2 27  --   GLUCOSE 90  --   BUN 21  --   CREATININE 0.93 0.87  CALCIUM 8.8*  --    GFR: Estimated Creatinine Clearance: 79.8 mL/min (by C-G formula based on SCr of 0.87 mg/dL). Liver Function Tests: Recent Labs  Lab 05/25/20 1715  AST 34  ALT 29  ALKPHOS 73  BILITOT 0.8  PROT 7.1  ALBUMIN 4.4   No results for input(s): LIPASE, AMYLASE in the last 168 hours. No results for input(s): AMMONIA in the last 168 hours. Coagulation Profile: No results for input(s): INR, PROTIME in the last 168 hours. Cardiac Enzymes: No results for  input(s): CKTOTAL, CKMB, CKMBINDEX, TROPONINI in the last 168 hours. BNP (last 3 results) No results for input(s): PROBNP in the last 8760 hours. HbA1C: No results for input(s): HGBA1C in the last 72 hours. CBG: No results for input(s): GLUCAP in the last 168 hours. Lipid Profile: Recent Labs    05/26/20 0615  CHOL 143  HDL 52  LDLCALC 63  TRIG 141  CHOLHDL 2.8   Thyroid Function Tests: No results for input(s): TSH, T4TOTAL, FREET4, T3FREE, THYROIDAB in the last 72 hours. Anemia Panel: No results for input(s): VITAMINB12, FOLATE, FERRITIN, TIBC, IRON, RETICCTPCT in the last 72 hours. Urine analysis:    Component Value Date/Time   COLORURINE Yellow 05/12/2012 1245   APPEARANCEUR Clear 11/22/2019 1057   LABSPEC 1.016 05/12/2012 1245   PHURINE 9.0 05/12/2012 1245   GLUCOSEU Negative 11/22/2019 1057   GLUCOSEU Negative 05/12/2012 1245   HGBUR 3+ 05/12/2012 1245   BILIRUBINUR Negative 11/22/2019 1057   BILIRUBINUR Negative 05/12/2012 1245   KETONESUR Negative 05/12/2012 1245   PROTEINUR Negative 11/22/2019 1057   PROTEINUR 30 mg/dL 05/12/2012 1245   NITRITE Negative 11/22/2019 1057   NITRITE Negative 05/12/2012 1245   LEUKOCYTESUR Negative 11/22/2019 1057   LEUKOCYTESUR Negative 05/12/2012 1245    Radiological Exams on Admission: DG Chest 2 View  Result Date: 05/25/2020 CLINICAL DATA:  Chest pain EXAM: CHEST - 2 VIEW COMPARISON:  Chest x-ray 12/15/2005 FINDINGS: The heart size and mediastinal contours are within normal limits. Left base compressive atelectasis. No focal consolidation. No pulmonary edema. Blunting of bilateral costophrenic angles with possible trace pleural effusions bilaterally. No pneumothorax. No acute osseous abnormality. Multilevel degenerative changes of the spine. IMPRESSION: 1. Blunting of bilateral costophrenic angles with possible trace pleural effusions bilaterally. 2. Otherwise no acute cardiopulmonary abnormality. Electronically Signed   By:  Iven Finn M.D.   On: 05/25/2020 19:52    EKG: Independently reviewed.  Sinus rhythm  Assessment/Plan Principal Problem:  Angina pectoris ([place]) Active Problems:   CAD, NATIVE VESSEL   GERD   HTN (hypertension)   BPH (benign prostatic hyperplasia)      Angina In a patient with known coronary artery disease who presents for evaluation of substernal chest pain mostly with exertion with increasing frequency. Chest pain is relieved at rest and he denies having any other associated symptoms Twelve-lead EKG is within normal limits troponin is slightly elevated Continue aspirin, metoprolol and statins We'll request cardiology consult    BPH Continue finasteride and Uroxatrol    Dyslipidemia Continue statins and Vascepa   Hypertension Continue metoprolol     DVT prophylaxis: Lovenox Code Status: Full code Family Communication: Greater than 50% of time was spent discussing plan of care with patient at the bedside.  All questions and concerns have been addressed.  He verbalizes understanding and agrees with the plan. Disposition Plan: Back to previous home environment Consults called: Cardiology    Collier Bullock MD [facility]     05/26/2020, 10:44 AM

## 2020-05-26 NOTE — Plan of Care (Signed)
  Problem: Clinical Measurements: Goal: Cardiovascular complication will be avoided Outcome: Progressing   Problem: Clinical Measurements: Goal: Ability to maintain clinical measurements within normal limits will improve Outcome: Progressing   Problem: Clinical Measurements: Goal: Will remain free from infection Outcome: Progressing   Problem: Clinical Measurements: Goal: Diagnostic test results will improve Outcome: Progressing   Problem: Activity: Goal: Risk for activity intolerance will decrease Outcome: Progressing   Problem: Cardiovascular: Goal: Ability to achieve and maintain adequate cardiovascular perfusion will improve Outcome: Progressing   Problem: Activity: Goal: Ability to return to baseline activity level will improve Outcome: Progressing

## 2020-05-26 NOTE — Progress Notes (Signed)
[facility] Encounter Note  Patient: Clayton Powell / Admit Date: 05/26/2020 / Date of Encounter: 05/26/2020, 4:42 PM   Subjective: Patient feels well.  No evidence of chest discomfort unless exerting himself before he came in.  Troponin level minimally elevated more consistent with unstable angina rather than coronary artery disease or myocardial infarction. Echocardiogram showing normal LV systolic function with ejection fraction of 60% and no evidence of significant valvular heart disease Cardiac catheterization showing normal LV systolic function with ejection fraction of 60% Patient has significant proximal coronary artery disease with severe left main ostial LAD ostial circumflex and ostial right coronary artery disease with good distal targets  Review of Systems: Positive for: Shortness of breath Negative for: Vision change, hearing change, syncope, dizziness, nausea, vomiting,diarrhea, bloody stool, stomach pain, cough, congestion, diaphoresis, urinary frequency, urinary pain,skin lesions, skin rashes Others previously listed  Objective: Telemetry: Normal sinus rhythm Physical Exam: Blood pressure (!) 172/88, pulse 65, temperature 98 F (36.7 C), temperature source Oral, resp. rate 14, height 5\' 6"  (1.676 m), weight 90.7 kg, SpO2 99 %. Body mass index is 32.28 kg/m. General: Well developed, well nourished, in no acute distress. Head: Normocephalic, atraumatic, sclera non-icteric, no xanthomas, nares are without discharge. Neck: No apparent masses Lungs: Normal respirations with no wheezes, no rhonchi, no rales , no crackles   Heart: Regular rate and rhythm, normal S1 S2, no murmur, no rub, no gallop, PMI is normal size and placement, carotid upstroke normal without bruit, jugular venous pressure normal Abdomen: Soft, non-tender, non-distended with normoactive bowel sounds. No hepatosplenomegaly. Abdominal aorta is normal size without bruit Extremities: No  edema, no clubbing, no cyanosis, no ulcers,  Peripheral: 2+ radial, 2+ femoral, 2+ dorsal pedal pulses Neuro: Alert and oriented. Moves all extremities spontaneously. Psych:  Responds to questions appropriately with a normal affect.   Intake/Output Summary (Last 24 hours) at 05/26/2020 1642 Last data filed at 05/26/2020 0800 Gross per 24 hour  Intake 0 ml  Output 0 ml  Net 0 ml    Inpatient Medications:  . [MAR Hold] alfuzosin  10 mg Oral q1800  . [START ON 05/27/2020] aspirin  81 mg Oral Pre-Cath  . [MAR Hold] azelastine  1 spray Each Nare BID  . [MAR Hold] calcium-vitamin D  1 tablet Oral BID  . [MAR Hold] cholecalciferol  2,000 Units Oral Daily  . [MAR Hold] enoxaparin (LOVENOX) injection  0.5 mg/kg Subcutaneous Q24H  . [MAR Hold] finasteride  5 mg Oral q1800  . [MAR Hold] fluticasone  1 spray Each Nare BID  . [MAR Hold] metoprolol succinate  25 mg Oral Daily  . [MAR Hold] multivitamin with minerals  1 tablet Oral Daily  . [MAR Hold] omega-3 acid ethyl esters  1 g Oral QID  . [MAR Hold] rosuvastatin  5 mg Oral q1800  . [MAR Hold] sodium chloride flush  3 mL Intravenous Q12H  . [MAR Hold] vitamin B-12  5,000 mcg Oral Daily   Infusions:  . sodium chloride 75 mL/hr at 05/26/20 1000  . sodium chloride    . [START ON 05/27/2020] sodium chloride     Followed by  . [START ON 05/27/2020] sodium chloride      Labs: Recent Labs    05/25/20 1715 05/26/20 0615  NA 136  --   K 4.1  --   CL 100  --   CO2 27  --   GLUCOSE 90  --   BUN 21  --   CREATININE 0.93  0.87  CALCIUM 8.8*  --    Recent Labs    05/25/20 1715  AST 34  ALT 29  ALKPHOS 73  BILITOT 0.8  PROT 7.1  ALBUMIN 4.4   Recent Labs    05/25/20 1715 05/26/20 0615  WBC 7.8 5.6  NEUTROABS 4.6  --   HGB 13.9 13.8  HCT 41.8 40.7  MCV 92.1 91.9  PLT 229 211   No results for input(s): CKTOTAL, CKMB, TROPONINI in the last 72 hours. Invalid input(s): POCBNP No results for input(s): HGBA1C in the last 72 hours.    Weights: Filed Weights   05/25/20 1906  Weight: 90.7 kg     Radiology/Studies:  DG Chest 2 View  Result Date: 05/25/2020 CLINICAL DATA:  Chest pain EXAM: CHEST - 2 VIEW COMPARISON:  Chest x-ray 12/15/2005 FINDINGS: The heart size and mediastinal contours are within normal limits. Left base compressive atelectasis. No focal consolidation. No pulmonary edema. Blunting of bilateral costophrenic angles with possible trace pleural effusions bilaterally. No pneumothorax. No acute osseous abnormality. Multilevel degenerative changes of the spine. IMPRESSION: 1. Blunting of bilateral costophrenic angles with possible trace pleural effusions bilaterally. 2. Otherwise no acute cardiopulmonary abnormality. Electronically Signed   By: Iven Finn M.D.   On: 05/25/2020 19:52   ECHOCARDIOGRAM COMPLETE  Result Date: 05/26/2020    ECHOCARDIOGRAM REPORT   Patient Name:   Clayton Powell Date of Exam: 05/26/2020 Medical Rec #:  [id number]        Height:       66.0 in Accession #:    [id number]       Weight:       200.0 lb Date of Birth:  07/25/1946       BSA:          2.000 m Patient Age:    74 years         BP:           178/85 mmHg Patient Gender: M                HR:           49 bpm. Exam Location:  [facility] Procedure: 2D Echo, Cardiac Doppler and Color Doppler Indications:     Chest pain 786.50  History:         Patient has no prior history of Echocardiogram examinations.                  CAD; Risk Factors:Hypertension and Dyslipidemia.  Sonographer:     Sherrie Sport RDCS (AE) Referring Phys:  [place] Diagnosing Phys: Serafina Royals MD IMPRESSIONS  1. Left ventricular ejection fraction, by estimation, is 60 to 65%. The left ventricle has normal function. The left ventricle has no regional wall motion abnormalities. Left ventricular diastolic parameters were normal.  2. Right ventricular systolic function is normal. The right ventricular size is normal.  3. The mitral valve is normal in structure.  Trivial mitral valve regurgitation.  4. The aortic valve is normal in structure. Aortic valve regurgitation is not visualized. FINDINGS  Left Ventricle: Left ventricular ejection fraction, by estimation, is 60 to 65%. The left ventricle has normal function. The left ventricle has no regional wall motion abnormalities. The left ventricular internal cavity size was normal in size. There is  no left ventricular hypertrophy. Left ventricular diastolic parameters were normal. Right Ventricle: The right ventricular size is normal. No increase in right ventricular wall thickness. Right ventricular systolic function is normal. Left  Atrium: Left atrial size was normal in size. Right Atrium: Right atrial size was normal in size. Pericardium: There is no evidence of pericardial effusion. Mitral Valve: The mitral valve is normal in structure. Trivial mitral valve regurgitation. Tricuspid Valve: The tricuspid valve is normal in structure. Tricuspid valve regurgitation is trivial. Aortic Valve: The aortic valve is normal in structure. Aortic valve regurgitation is not visualized. Aortic valve mean gradient measures 2.0 mmHg. Aortic valve peak gradient measures 3.9 mmHg. Aortic valve area, by VTI measures 4.73 cm. Pulmonic Valve: The pulmonic valve was normal in structure. Pulmonic valve regurgitation is not visualized. Aorta: The aortic root and ascending aorta are structurally normal, with no evidence of dilitation. IAS/Shunts: No atrial level shunt detected by color flow Doppler.  LEFT VENTRICLE PLAX 2D LVIDd:         4.36 cm LVIDs:         2.80 cm LV PW:         1.32 cm LV IVS:        1.21 cm LVOT diam:     2.30 cm LV SV:         95 LV SV Index:   48 LVOT Area:     4.15 cm  RIGHT VENTRICLE RV Basal diam:  3.72 cm RV S prime:     12.20 cm/s TAPSE (M-mode): 3.1 cm LEFT ATRIUM             Index       RIGHT ATRIUM           Index LA diam:        3.80 cm 1.90 cm/m  RA Area:     17.70 cm LA Vol (A2C):   64.8 ml 32.40 ml/m RA  Volume:   48.70 ml  24.35 ml/m LA Vol (A4C):   58.4 ml 29.20 ml/m LA Biplane Vol: 61.7 ml 30.85 ml/m  AORTIC VALVE                   PULMONIC VALVE AV Area (Vmax):    3.60 cm    PV Vmax:        0.57 m/s AV Area (Vmean):   4.05 cm    PV Peak grad:   1.3 mmHg AV Area (VTI):     4.73 cm    RVOT Peak grad: 2 mmHg AV Vmax:           98.80 cm/s AV Vmean:          64.750 cm/s AV VTI:            0.201 m AV Peak Grad:      3.9 mmHg AV Mean Grad:      2.0 mmHg LVOT Vmax:         85.50 cm/s LVOT Vmean:        63.100 cm/s LVOT VTI:          0.229 m LVOT/AV VTI ratio: 1.14  AORTA Ao Root diam: 3.10 cm MITRAL VALVE                TRICUSPID VALVE MV Area (PHT): 2.39 cm     TR Peak grad:   13.1 mmHg MV Decel Time: 318 msec     TR Vmax:        181.00 cm/s MV E velocity: 84.00 cm/s MV A velocity: 116.00 cm/s  SHUNTS MV E/A ratio:  0.72         Systemic VTI:  0.23 m  Systemic Diam: 2.30 cm Serafina Royals MD Electronically signed by Serafina Royals MD Signature Date/Time: 05/26/2020/1:42:02 PM    Final      Assessment and Recommendation  74 y.o. male with known no significant hyperlipidemia borderline hypertension having progression of shortness of breath and chest discomfort consistent with unstable angina with a cardiac catheterization showing critical distal left main ostial LAD ostial circumflex and ostial right coronary atherosclerosis 1.  Continue nitrates for chest discomfort and aspirin 2.  Beta-blocker for LV systolic function anginal symptoms 3.  Transfer for coronary artery bypass grafting 4.  High intensity cholesterol therapy 5.  Cardiac rehabilitation  Signed, Serafina Royals M.D. FACC

## 2020-05-27 ENCOUNTER — Inpatient Hospital Stay (HOSPITAL_COMMUNITY)
Admission: AD | Admit: 2020-05-27 | Discharge: 2020-06-07 | DRG: 236 | Disposition: A | Discharge status: Home / Self Care | Payer: 59 | Source: Other Acute Inpatient Hospital | Attending: Cardiothoracic Surgery | Admitting: Cardiothoracic Surgery

## 2020-05-27 ENCOUNTER — Other Ambulatory Visit: Payer: Self-pay | Admitting: *Deleted

## 2020-05-27 DIAGNOSIS — Z823 Family history of stroke: Secondary | ICD-10-CM | POA: Diagnosis not present

## 2020-05-27 DIAGNOSIS — R001 Bradycardia, unspecified: Secondary | ICD-10-CM | POA: Diagnosis not present

## 2020-05-27 DIAGNOSIS — N401 Enlarged prostate with lower urinary tract symptoms: Secondary | ICD-10-CM | POA: Diagnosis present

## 2020-05-27 DIAGNOSIS — I209 Angina pectoris, unspecified: Secondary | ICD-10-CM | POA: Diagnosis not present

## 2020-05-27 DIAGNOSIS — T8383XA Hemorrhage of genitourinary prosthetic devices, implants and grafts, initial encounter: Secondary | ICD-10-CM | POA: Diagnosis not present

## 2020-05-27 DIAGNOSIS — I2510 Atherosclerotic heart disease of native coronary artery without angina pectoris: Secondary | ICD-10-CM | POA: Diagnosis present

## 2020-05-27 DIAGNOSIS — E7849 Other hyperlipidemia: Secondary | ICD-10-CM | POA: Diagnosis present

## 2020-05-27 DIAGNOSIS — D696 Thrombocytopenia, unspecified: Secondary | ICD-10-CM | POA: Diagnosis not present

## 2020-05-27 DIAGNOSIS — N3289 Other specified disorders of bladder: Secondary | ICD-10-CM | POA: Diagnosis present

## 2020-05-27 DIAGNOSIS — I25110 Atherosclerotic heart disease of native coronary artery with unstable angina pectoris: Principal | ICD-10-CM

## 2020-05-27 DIAGNOSIS — Z79899 Other long term (current) drug therapy: Secondary | ICD-10-CM

## 2020-05-27 DIAGNOSIS — I2582 Chronic total occlusion of coronary artery: Secondary | ICD-10-CM | POA: Diagnosis present

## 2020-05-27 DIAGNOSIS — Z7982 Long term (current) use of aspirin: Secondary | ICD-10-CM

## 2020-05-27 DIAGNOSIS — Z951 Presence of aortocoronary bypass graft: Secondary | ICD-10-CM

## 2020-05-27 DIAGNOSIS — I200 Unstable angina: Secondary | ICD-10-CM | POA: Diagnosis present

## 2020-05-27 DIAGNOSIS — Y846 Urinary catheterization as the cause of abnormal reaction of the patient, or of later complication, without mention of misadventure at the time of the procedure: Secondary | ICD-10-CM | POA: Diagnosis not present

## 2020-05-27 DIAGNOSIS — D62 Acute posthemorrhagic anemia: Secondary | ICD-10-CM | POA: Diagnosis not present

## 2020-05-27 DIAGNOSIS — I25119 Atherosclerotic heart disease of native coronary artery with unspecified angina pectoris: Secondary | ICD-10-CM | POA: Diagnosis not present

## 2020-05-27 DIAGNOSIS — I10 Essential (primary) hypertension: Secondary | ICD-10-CM | POA: Diagnosis present

## 2020-05-27 DIAGNOSIS — I4891 Unspecified atrial fibrillation: Secondary | ICD-10-CM | POA: Diagnosis not present

## 2020-05-27 DIAGNOSIS — R338 Other retention of urine: Secondary | ICD-10-CM | POA: Diagnosis not present

## 2020-05-27 DIAGNOSIS — K219 Gastro-esophageal reflux disease without esophagitis: Secondary | ICD-10-CM | POA: Diagnosis present

## 2020-05-27 DIAGNOSIS — Z09 Encounter for follow-up examination after completed treatment for conditions other than malignant neoplasm: Secondary | ICD-10-CM

## 2020-05-27 MED ORDER — FINASTERIDE 5 MG PO TABS
5.0000 mg | ORAL_TABLET | Freq: Every day | ORAL | Status: DC
  Administered 2020-05-28: 5 mg via ORAL
  Filled 2020-05-27: qty 1

## 2020-05-27 MED ORDER — HEPARIN BOLUS VIA INFUSION
2000.0000 [IU] | Freq: Once | INTRAVENOUS | Status: AC
  Administered 2020-05-27: 2000 [IU] via INTRAVENOUS
  Filled 2020-05-27: qty 2000

## 2020-05-27 MED ORDER — OMEGA-3-ACID ETHYL ESTERS 1 G PO CAPS
1.0000 g | ORAL_CAPSULE | Freq: Four times a day (QID) | ORAL | 0 refills | Status: DC
Start: 2020-05-27 — End: 2020-05-27

## 2020-05-27 MED ORDER — ACETAMINOPHEN 325 MG PO TABS: 650.0000 mg | ORAL_TABLET | ORAL | Status: DC | PRN

## 2020-05-27 MED ORDER — ONDANSETRON HCL 4 MG/2ML IJ SOLN: 4.0000 mg | Freq: Four times a day (QID) | INTRAMUSCULAR | Status: DC | PRN

## 2020-05-27 MED ORDER — HEPARIN (PORCINE) 25000 UT/250ML-% IV SOLN
1000.0000 [IU]/h | INTRAVENOUS | Status: DC
  Administered 2020-05-27 – 2020-05-28 (×2): 1000 [IU]/h via INTRAVENOUS
  Filled 2020-05-27 (×2): qty 250

## 2020-05-27 MED ORDER — ATORVASTATIN CALCIUM 80 MG PO TABS
80.0000 mg | ORAL_TABLET | Freq: Every day | ORAL | Status: DC
  Administered 2020-05-27 – 2020-06-06 (×11): 80 mg via ORAL
  Filled 2020-05-27 (×11): qty 1

## 2020-05-27 MED ORDER — ICOSAPENT ETHYL 1 G PO CAPS
1.0000 g | ORAL_CAPSULE | Freq: Four times a day (QID) | ORAL | Status: DC
  Administered 2020-05-27 – 2020-05-28 (×3): 1 g via ORAL
  Filled 2020-05-27 (×4): qty 1

## 2020-05-27 MED ORDER — METOPROLOL SUCCINATE ER 25 MG PO TB24
25.0000 mg | ORAL_TABLET | Freq: Every day | ORAL | Status: DC
  Administered 2020-05-28: 25 mg via ORAL
  Filled 2020-05-27: qty 1

## 2020-05-27 MED ORDER — METOPROLOL SUCCINATE ER 25 MG PO TB24: 25.0000 mg | ORAL_TABLET | Freq: Every day | ORAL | 0 refills | Status: DC

## 2020-05-27 MED ORDER — ASPIRIN EC 81 MG PO TBEC
81.0000 mg | DELAYED_RELEASE_TABLET | Freq: Every day | ORAL | Status: DC
  Administered 2020-05-28: 81 mg via ORAL
  Filled 2020-05-27: qty 1

## 2020-05-27 MED ORDER — NITROGLYCERIN 0.4 MG SL SUBL: 0.4000 mg | SUBLINGUAL_TABLET | SUBLINGUAL | Status: DC | PRN

## 2020-05-27 NOTE — Progress Notes (Signed)
[facility] Encounter Note  Patient: Clayton Powell / Admit Date: 05/26/2020 / Date of Encounter: 05/27/2020, 8:25 AM   Subjective: Patient feels well.  No evidence of chest discomfort unless exerting himself before he came in.  Troponin level minimally elevated more consistent with unstable angina rather than coronary artery disease or myocardial infarction. Echocardiogram showing normal LV systolic function with ejection fraction of 60% and no evidence of significant valvular heart disease Cardiac catheterization showing normal LV systolic function with ejection fraction of 60% Patient has significant proximal coronary artery disease with severe left main ostial LAD ostial circumflex and ostial right coronary artery disease with good distal targets  Review of Systems: Positive for: None Negative for: Vision change, hearing change, syncope, dizziness, nausea, vomiting,diarrhea, bloody stool, stomach pain, cough, congestion, diaphoresis, urinary frequency, urinary pain,skin lesions, skin rashes Others previously listed  Objective: Telemetry: Normal sinus rhythm Physical Exam: Blood pressure (!) 148/92, pulse (!) 54, temperature 97.9 F (36.6 C), temperature source Oral, resp. rate 18, height 5\' 6"  (1.676 m), weight 93.6 kg, SpO2 94 %. Body mass index is 33.31 kg/m. General: Well developed, well nourished, in no acute distress. Head: Normocephalic, atraumatic, sclera non-icteric, no xanthomas, nares are without discharge. Neck: No apparent masses Lungs: Normal respirations with no wheezes, no rhonchi, no rales , no crackles   Heart: Regular rate and rhythm, normal S1 S2, no murmur, no rub, no gallop, PMI is normal size and placement, carotid upstroke normal without bruit, jugular venous pressure normal Abdomen: Soft, non-tender, non-distended with normoactive bowel sounds. No hepatosplenomegaly. Abdominal aorta is normal size without bruit Extremities: No edema, no  clubbing, no cyanosis, no ulcers,  Peripheral: 2+ radial, 2+ femoral, 2+ dorsal pedal pulses Neuro: Alert and oriented. Moves all extremities spontaneously. Psych:  Responds to questions appropriately with a normal affect.   Intake/Output Summary (Last 24 hours) at 05/27/2020 0825 Last data filed at 05/27/2020 0700 Gross per 24 hour  Intake 799.54 ml  Output 830 ml  Net -30.46 ml    Inpatient Medications:  . alfuzosin  10 mg Oral q1800  . azelastine  1 spray Each Nare BID  . calcium-vitamin D  1 tablet Oral BID  . cholecalciferol  2,000 Units Oral Daily  . enoxaparin (LOVENOX) injection  0.5 mg/kg Subcutaneous Q24H  . finasteride  5 mg Oral q1800  . fluticasone  1 spray Each Nare BID  . metoprolol succinate  25 mg Oral Daily  . multivitamin with minerals  1 tablet Oral Daily  . omega-3 acid ethyl esters  1 g Oral QID  . rosuvastatin  5 mg Oral q1800  . sodium chloride flush  3 mL Intravenous Q12H  . vitamin B-12  5,000 mcg Oral Daily   Infusions:  . sodium chloride 75 mL/hr at 05/26/20 2146    Labs: Recent Labs    05/25/20 1715 05/26/20 0615  NA 136  --   K 4.1  --   CL 100  --   CO2 27  --   GLUCOSE 90  --   BUN 21  --   CREATININE 0.93 0.87  CALCIUM 8.8*  --    Recent Labs    05/25/20 1715  AST 34  ALT 29  ALKPHOS 73  BILITOT 0.8  PROT 7.1  ALBUMIN 4.4   Recent Labs    05/25/20 1715 05/26/20 0615  WBC 7.8 5.6  NEUTROABS 4.6  --   HGB 13.9 13.8  HCT 41.8 40.7  MCV 92.1  91.9  PLT 229 211   No results for input(s): CKTOTAL, CKMB, TROPONINI in the last 72 hours. Invalid input(s): POCBNP No results for input(s): HGBA1C in the last 72 hours.   Weights: Filed Weights   05/25/20 1906 05/26/20 1805 05/27/20 0414  Weight: 90.7 kg 91.1 kg 93.6 kg     Radiology/Studies:  DG Chest 2 View  Result Date: 05/25/2020 CLINICAL DATA:  Chest pain EXAM: CHEST - 2 VIEW COMPARISON:  Chest x-ray 12/15/2005 FINDINGS: The heart size and mediastinal contours are  within normal limits. Left base compressive atelectasis. No focal consolidation. No pulmonary edema. Blunting of bilateral costophrenic angles with possible trace pleural effusions bilaterally. No pneumothorax. No acute osseous abnormality. Multilevel degenerative changes of the spine. IMPRESSION: 1. Blunting of bilateral costophrenic angles with possible trace pleural effusions bilaterally. 2. Otherwise no acute cardiopulmonary abnormality. Electronically Signed   By: Iven Finn M.D.   On: 05/25/2020 19:52   CARDIAC CATHETERIZATION  Result Date: 05/26/2020  Ost LAD to Prox LAD lesion is 95% stenosed.  Prox LAD to Mid LAD lesion is 75% stenosed with 75% stenosed side branch in 1st Sept.  Ramus lesion is 90% stenosed.  Ost Cx to Prox Cx lesion is 99% stenosed.  Mid Cx lesion is 100% stenosed.  Prox RCA lesion is 100% stenosed.  Mid RCA lesion is 100% stenosed.  74 year old male with severe hyperlipidemia hypertension with progressive symptoms of shortness of breath anginal symptoms over the last several weeks with some chest pain at rest and a slight elevation of troponin Cardiogram showing normal LV systolic function with ejection fraction to 60% and no evidence of significant valvular heart disease Left ventricle angiogram showing normal LV systolic function Patient has critical distal left main ostial LAD ostial ramus ostial right coronary artery and ostial circumflex artery stenosis Plan Continue nitrates for chest discomfort Aspirin beta-blocker for hypertension control and cardiovascular disease Discussion for transfer for coronary artery bypass graft Cardiac rehabilitation   ECHOCARDIOGRAM COMPLETE  Result Date: 05/26/2020    ECHOCARDIOGRAM REPORT   Patient Name:   [place] Date of Exam: 05/26/2020 Medical Rec #:  [id number]        Height:       66.0 in Accession #:    [id number]       Weight:       200.0 lb Date of Birth:  Sep 26, 1945       BSA:          2.000 m Patient Age:    74  years         BP:           178/85 mmHg Patient Gender: M                HR:           49 bpm. Exam Location:  [facility] Procedure: 2D Echo, Cardiac Doppler and Color Doppler Indications:     Chest pain 786.50  History:         Patient has no prior history of Echocardiogram examinations.                  CAD; Risk Factors:Hypertension and Dyslipidemia.  Sonographer:     Sherrie Sport RDCS (AE) Referring Phys:  [place] Diagnosing Phys: Serafina Royals MD IMPRESSIONS  1. Left ventricular ejection fraction, by estimation, is 60 to 65%. The left ventricle has normal function. The left ventricle has no regional wall motion abnormalities. Left ventricular diastolic parameters  were normal.  2. Right ventricular systolic function is normal. The right ventricular size is normal.  3. The mitral valve is normal in structure. Trivial mitral valve regurgitation.  4. The aortic valve is normal in structure. Aortic valve regurgitation is not visualized. FINDINGS  Left Ventricle: Left ventricular ejection fraction, by estimation, is 60 to 65%. The left ventricle has normal function. The left ventricle has no regional wall motion abnormalities. The left ventricular internal cavity size was normal in size. There is  no left ventricular hypertrophy. Left ventricular diastolic parameters were normal. Right Ventricle: The right ventricular size is normal. No increase in right ventricular wall thickness. Right ventricular systolic function is normal. Left Atrium: Left atrial size was normal in size. Right Atrium: Right atrial size was normal in size. Pericardium: There is no evidence of pericardial effusion. Mitral Valve: The mitral valve is normal in structure. Trivial mitral valve regurgitation. Tricuspid Valve: The tricuspid valve is normal in structure. Tricuspid valve regurgitation is trivial. Aortic Valve: The aortic valve is normal in structure. Aortic valve regurgitation is not visualized. Aortic valve mean gradient  measures 2.0 mmHg. Aortic valve peak gradient measures 3.9 mmHg. Aortic valve area, by VTI measures 4.73 cm. Pulmonic Valve: The pulmonic valve was normal in structure. Pulmonic valve regurgitation is not visualized. Aorta: The aortic root and ascending aorta are structurally normal, with no evidence of dilitation. IAS/Shunts: No atrial level shunt detected by color flow Doppler.  LEFT VENTRICLE PLAX 2D LVIDd:         4.36 cm LVIDs:         2.80 cm LV PW:         1.32 cm LV IVS:        1.21 cm LVOT diam:     2.30 cm LV SV:         95 LV SV Index:   48 LVOT Area:     4.15 cm  RIGHT VENTRICLE RV Basal diam:  3.72 cm RV S prime:     12.20 cm/s TAPSE (M-mode): 3.1 cm LEFT ATRIUM             Index       RIGHT ATRIUM           Index LA diam:        3.80 cm 1.90 cm/m  RA Area:     17.70 cm LA Vol (A2C):   64.8 ml 32.40 ml/m RA Volume:   48.70 ml  24.35 ml/m LA Vol (A4C):   58.4 ml 29.20 ml/m LA Biplane Vol: 61.7 ml 30.85 ml/m  AORTIC VALVE                   PULMONIC VALVE AV Area (Vmax):    3.60 cm    PV Vmax:        0.57 m/s AV Area (Vmean):   4.05 cm    PV Peak grad:   1.3 mmHg AV Area (VTI):     4.73 cm    RVOT Peak grad: 2 mmHg AV Vmax:           98.80 cm/s AV Vmean:          64.750 cm/s AV VTI:            0.201 m AV Peak Grad:      3.9 mmHg AV Mean Grad:      2.0 mmHg LVOT Vmax:         85.50 cm/s LVOT Vmean:  63.100 cm/s LVOT VTI:          0.229 m LVOT/AV VTI ratio: 1.14  AORTA Ao Root diam: 3.10 cm MITRAL VALVE                TRICUSPID VALVE MV Area (PHT): 2.39 cm     TR Peak grad:   13.1 mmHg MV Decel Time: 318 msec     TR Vmax:        181.00 cm/s MV E velocity: 84.00 cm/s MV A velocity: 116.00 cm/s  SHUNTS MV E/A ratio:  0.72         Systemic VTI:  0.23 m                             Systemic Diam: 2.30 cm Serafina Royals MD Electronically signed by Serafina Royals MD Signature Date/Time: 05/26/2020/1:42:02 PM    Final      Assessment and Recommendation  74 y.o. male with known no significant  hyperlipidemia borderline hypertension having progression of shortness of breath and chest discomfort consistent with unstable angina with a cardiac catheterization showing critical distal left main ostial LAD ostial circumflex and ostial right coronary atherosclerosis 1.  Continue nitrates for chest discomfort as needed 2.  Beta-blocker for antianginal treatment and risk reduction of myocardial infarction 3.  Transfer for coronary artery bypass grafting to [facility] for which transfer orders and EMTALA and discussion with accepting physicians has been performed 4.  High intensity cholesterol therapy 5.  Cardiac rehabilitation  Signed, Serafina Royals M.D. FACC

## 2020-05-27 NOTE — Discharge Summary (Signed)
Physician Discharge Summary  Clayton Powell GQQ:[id number] DOB: [birth year] DOA: 05/26/2020  PCP: Tonia Ghent, MD  Admit date: 05/26/2020 Discharge date: 05/27/2020  Time spent: 40 minutes  Recommendations for Outpatient/[place] Follow-up:  1. Follow with CT surgery at [facility] for eval for CABG 2. Follow cardiology recs at [facility] 3. Continue aspirin, beta blocker and nitrates as needed 4. Transfer for eval for CABG 5. Cardiac rehab  Discharge Diagnoses:  Principal Problem:   Angina pectoris ([place]) Active Problems:   CAD, NATIVE VESSEL   GERD   HTN (hypertension)   BPH (benign prostatic hyperplasia)   Discharge Condition: stable  Diet recommendation: clear liquid -> per accepting physician  Filed Weights   05/25/20 1906 05/26/20 1805 05/27/20 0414  Weight: 90.7 kg 91.1 kg 93.6 kg    History of present illness:  Clayton Powell is Clayton Powell 74 y.o. male with medical history significant for coronary artery disease (diagnosis made by coronary calcium scoring), history of dyslipidemia, hypertension and BPH who presents to the ER for evaluation of chest pain.  He had and elevated troponin and was diagnosed with unstable angina, now s/p cardiac catheterization and plan is for [facility] given findings.  See below for additional details  Hospital Course:  Unstable Angina S/p cardiac catheterization showing critical distal L main ostial LAD ostial circumflex and ostial R coronary atherosclerosis Cardiology recommending transfer for CABG Continue aspirin, statin, beta blocker [facility] can continue to consult as needed at [facility] Echo without RWMA, normal EF  BPH Continue finasteride and uroxatrol  Dyslipidemia Continue vascepa, crestor  Hypertension: Continue metoprolol  Procedures: Echo IMPRESSIONS    1. Left ventricular ejection fraction, by estimation, is 60 to 65%. The  left ventricle has normal function. The left ventricle has no regional  wall  motion abnormalities. Left ventricular diastolic parameters were  normal.  2. Right ventricular systolic function is normal. The right ventricular  size is normal.  3. The mitral valve is normal in structure. Trivial mitral valve  regurgitation.  4. The aortic valve is normal in structure. Aortic valve regurgitation is  not visualized.   Left Heart Cath  Ost LAD to Prox LAD lesion is 95% stenosed.  Prox LAD to Mid LAD lesion is 75% stenosed with 75% stenosed side branch in 1st Sept.  Ramus lesion is 90% stenosed.  Ost Cx to Prox Cx lesion is 99% stenosed.  Mid Cx lesion is 100% stenosed.  Prox RCA lesion is 100% stenosed.  Mid RCA lesion is 100% stenosed.   74 year old male with severe hyperlipidemia hypertension with progressive symptoms of shortness of breath anginal symptoms over the last several weeks with some chest pain at rest and [name] slight elevation of troponin  Cardiogram showing normal LV systolic function with ejection fraction to 60% and no evidence of significant valvular heart disease  Left ventricle angiogram showing normal LV systolic function  Patient has critical distal left main ostial LAD ostial ramus ostial right coronary artery and ostial circumflex artery stenosis   Plan Continue nitrates for chest discomfort Aspirin beta-blocker for hypertension control and cardiovascular disease Discussion for transfer for coronary artery bypass graft Cardiac rehabilitation  Consultations:  cardiology  Discharge Exam: Vitals:   05/27/20 0819 05/27/20 1139  BP: (!) 148/92 (!) 162/79  Pulse: (!) 54 (!) 51  Resp: 18 18  Temp: 97.9 F (36.6 C) 98.4 F (36.9 C)  SpO2: 94% 94%   No new complaints No CP at rest Wife  at bedside  General: No acute distress. Cardiovascular: Heart sounds show [name] regular rate, and rhythm. Lungs: Clear to auscultation bilaterally  Abdomen: Soft, nontender, nondistended  Neurological: Alert and oriented 3. Moves all  extremities 4. Cranial nerves II through XII grossly intact. Skin: Warm and dry. No rashes or lesions. Extremities: No clubbing or cyanosis. No edema.   Discharge Instructions   Discharge Instructions    AMB Referral to Cardiac Rehabilitation - Phase II   Complete by: As directed    Diagnosis: CABG   CABG X ___: 3   Call MD for:  difficulty breathing, headache or visual disturbances   Complete by: As directed    Call MD for:  extreme fatigue   Complete by: As directed    Call MD for:  hives   Complete by: As directed    Call MD for:  persistant dizziness or light-headedness   Complete by: As directed    Call MD for:  persistant nausea and vomiting   Complete by: As directed    Call MD for:  redness, tenderness, or signs of infection (pain, swelling, redness, odor or green/yellow discharge around incision site)   Complete by: As directed    Call MD for:  severe uncontrolled pain   Complete by: As directed    Call MD for:  temperature >100.4   Complete by: As directed    Diet - low sodium heart healthy   Complete by: As directed    Discharge instructions   Complete by: As directed    We're transferring you to [facility] [place] for CT surgery evaluation.   Increase activity slowly   Complete by: As directed    No wound care   Complete by: As directed      Allergies as of 05/27/2020      Reactions   Ciprofloxacin Hcl Rash   Quinolones Rash      Medication List    STOP taking these medications   ibuprofen 200 MG tablet Commonly known as: ADVIL   tadalafil 5 MG tablet Commonly known as: Cialis     TAKE these medications   alfuzosin 10 MG 24 hr tablet Commonly known as: UROXATRAL Take 1 tablet (10 mg total) by mouth daily with breakfast.   aspirin 81 MG EC tablet Take 81 mg by mouth daily.   Azelastine-Fluticasone 137-50 MCG/ACT Susp Place 1 spray into both nostrils 2 (two) times daily.   calcium citrate-vitamin D 315-200 MG-UNIT tablet Commonly known as:  CITRACAL+D Take 1-2 tablets by mouth 2 (two) times daily.   clindamycin 1 % external solution Commonly known as: CLEOCIN T Apply topically 2 (two) times daily as needed.   finasteride 5 MG tablet Commonly known as: PROSCAR Take 1 tablet (5 mg total) by mouth daily.   metoprolol succinate 25 MG 24 hr tablet Commonly known as: TOPROL-XL Take 1 tablet (25 mg total) by mouth daily. Start taking on: May 28, 2020   multivitamin tablet Take 1 tablet by mouth daily.   rosuvastatin 5 MG tablet Commonly known as: CRESTOR Take 5 mg by mouth daily.   Vascepa 1 g capsule Generic drug: icosapent Ethyl Take 1 tablet by mouth 4 (four) times daily.   Vitamin B-12 5000 MCG Subl Place 1 tablet under the tongue daily.   Vitamin D 50 MCG (2000 UT) Caps Take 1 capsule by mouth daily.      Allergies  Allergen Reactions  . Ciprofloxacin Hcl Rash  . Quinolones Rash  Follow-up Information    Corey Skains, MD Follow up in 3 week(s).   Specialty: Cardiology Contact information: [address] [facility] [place] [id number] [phone]                The results of significant diagnostics from this hospitalization (including imaging, microbiology, ancillary and laboratory) are listed below for reference.    Significant Diagnostic Studies: DG Chest 2 View  Result Date: 05/25/2020 CLINICAL DATA:  Chest pain EXAM: CHEST - 2 VIEW COMPARISON:  Chest x-ray 12/15/2005 FINDINGS: The heart size and mediastinal contours are within normal limits. Left base compressive atelectasis. No focal consolidation. No pulmonary edema. Blunting of bilateral costophrenic angles with possible trace pleural effusions bilaterally. No pneumothorax. No acute osseous abnormality. Multilevel degenerative changes of the spine. IMPRESSION: 1. Blunting of bilateral costophrenic angles with possible trace pleural effusions bilaterally. 2. Otherwise no acute cardiopulmonary  abnormality. Electronically Signed   By: Iven Finn M.D.   On: 05/25/2020 19:52   CARDIAC CATHETERIZATION  Result Date: 05/26/2020  Ost LAD to Prox LAD lesion is 95% stenosed.  Prox LAD to Mid LAD lesion is 75% stenosed with 75% stenosed side branch in 1st Sept.  Ramus lesion is 90% stenosed.  Ost Cx to Prox Cx lesion is 99% stenosed.  Mid Cx lesion is 100% stenosed.  Prox RCA lesion is 100% stenosed.  Mid RCA lesion is 100% stenosed.  74 year old male with severe hyperlipidemia hypertension with progressive symptoms of shortness of breath anginal symptoms over the last several weeks with some chest pain at rest and Romin Divita slight elevation of troponin Cardiogram showing normal LV systolic function with ejection fraction to 60% and no evidence of significant valvular heart disease Left ventricle angiogram showing normal LV systolic function Patient has critical distal left main ostial LAD ostial ramus ostial right coronary artery and ostial circumflex artery stenosis Plan Continue nitrates for chest discomfort Aspirin beta-blocker for hypertension control and cardiovascular disease Discussion for transfer for coronary artery bypass graft Cardiac rehabilitation   ECHOCARDIOGRAM COMPLETE  Result Date: 05/26/2020    ECHOCARDIOGRAM REPORT   Patient Name:   Milbank Date of Exam: 05/26/2020 Medical Rec #:  [id number]        Height:       66.0 in Accession #:    [id number]       Weight:       200.0 lb Date of Birth:  26-May-1946       BSA:          2.000 m Patient Age:    74 years         BP:           178/85 mmHg Patient Gender: M                HR:           49 bpm. Exam Location:  [facility] Procedure: 2D Echo, Cardiac Doppler and Color Doppler Indications:     Chest pain 786.50  History:         Patient has no prior history of Echocardiogram examinations.                  CAD; Risk Factors:Hypertension and Dyslipidemia.  Sonographer:     Sherrie Sport RDCS (AE) Referring Phys:  [place]  Diagnosing Phys: Serafina Royals MD IMPRESSIONS  1. Left ventricular ejection fraction, by estimation, is 60 to 65%. The left ventricle has normal function. The  left ventricle has no regional wall motion abnormalities. Left ventricular diastolic parameters were normal.  2. Right ventricular systolic function is normal. The right ventricular size is normal.  3. The mitral valve is normal in structure. Trivial mitral valve regurgitation.  4. The aortic valve is normal in structure. Aortic valve regurgitation is not visualized. FINDINGS  Left Ventricle: Left ventricular ejection fraction, by estimation, is 60 to 65%. The left ventricle has normal function. The left ventricle has no regional wall motion abnormalities. The left ventricular internal cavity size was normal in size. There is  no left ventricular hypertrophy. Left ventricular diastolic parameters were normal. Right Ventricle: The right ventricular size is normal. No increase in right ventricular wall thickness. Right ventricular systolic function is normal. Left Atrium: Left atrial size was normal in size. Right Atrium: Right atrial size was normal in size. Pericardium: There is no evidence of pericardial effusion. Mitral Valve: The mitral valve is normal in structure. Trivial mitral valve regurgitation. Tricuspid Valve: The tricuspid valve is normal in structure. Tricuspid valve regurgitation is trivial. Aortic Valve: The aortic valve is normal in structure. Aortic valve regurgitation is not visualized. Aortic valve mean gradient measures 2.0 mmHg. Aortic valve peak gradient measures 3.9 mmHg. Aortic valve area, by VTI measures 4.73 cm. Pulmonic Valve: The pulmonic valve was normal in structure. Pulmonic valve regurgitation is not visualized. Aorta: The aortic root and ascending aorta are structurally normal, with no evidence of dilitation. IAS/Shunts: No atrial level shunt detected by color flow Doppler.  LEFT VENTRICLE PLAX 2D LVIDd:         4.36 cm  LVIDs:         2.80 cm LV PW:         1.32 cm LV IVS:        1.21 cm LVOT diam:     2.30 cm LV SV:         95 LV SV Index:   48 LVOT Area:     4.15 cm  RIGHT VENTRICLE RV Basal diam:  3.72 cm RV S prime:     12.20 cm/s TAPSE (M-mode): 3.1 cm LEFT ATRIUM             Index       RIGHT ATRIUM           Index LA diam:        3.80 cm 1.90 cm/m  RA Area:     17.70 cm LA Vol (A2C):   64.8 ml 32.40 ml/m RA Volume:   48.70 ml  24.35 ml/m LA Vol (A4C):   58.4 ml 29.20 ml/m LA Biplane Vol: 61.7 ml 30.85 ml/m  AORTIC VALVE                   PULMONIC VALVE AV Area (Vmax):    3.60 cm    PV Vmax:        0.57 m/s AV Area (Vmean):   4.05 cm    PV Peak grad:   1.3 mmHg AV Area (VTI):     4.73 cm    RVOT Peak grad: 2 mmHg AV Vmax:           98.80 cm/s AV Vmean:          64.750 cm/s AV VTI:            0.201 m AV Peak Grad:      3.9 mmHg AV Mean Grad:      2.0 mmHg LVOT Vmax:  85.50 cm/s LVOT Vmean:        63.100 cm/s LVOT VTI:          0.229 m LVOT/AV VTI ratio: 1.14  AORTA Ao Root diam: 3.10 cm MITRAL VALVE                TRICUSPID VALVE MV Area (PHT): 2.39 cm     TR Peak grad:   13.1 mmHg MV Decel Time: 318 msec     TR Vmax:        181.00 cm/s MV E velocity: 84.00 cm/s MV Nelton Amsden velocity: 116.00 cm/s  SHUNTS MV E/Rozanna Cormany ratio:  0.72         Systemic VTI:  0.23 m                             Systemic Diam: 2.30 cm Serafina Royals MD Electronically signed by Serafina Royals MD Signature Date/Time: 05/26/2020/1:42:02 PM    Final     Microbiology: Recent Results (from the past 240 hour(s))  Respiratory Panel by RT PCR (Flu Kierra Jezewski&B, Covid) - Nasopharyngeal Swab     Status: None   Collection Time: 05/26/20  5:29 AM   Specimen: Nasopharyngeal Swab  Result Value Ref Range Status   SARS Coronavirus 2 by RT PCR NEGATIVE NEGATIVE Final    Comment: (NOTE) SARS-CoV-2 target nucleic acids are NOT DETECTED.  The SARS-CoV-2 RNA is generally detectable in upper respiratoy specimens during the acute phase of infection. The  lowest concentration of SARS-CoV-2 viral copies this assay can detect is 131 copies/mL. Cleotha Whalin negative result does not preclude SARS-Cov-2 infection and should not be used as the sole basis for treatment or other patient management decisions. Taejon Irani negative result may occur with  improper specimen collection/handling, submission of specimen other than nasopharyngeal swab, presence of viral mutation(s) within the areas targeted by this assay, and inadequate number of viral copies (<131 copies/mL). Lonnel Gjerde negative result must be combined with clinical observations, patient history, and epidemiological information. The expected result is Negative.  Fact Sheet for Patients:  PinkCheek.be  Fact Sheet for Healthcare Providers:  GravelBags.it  This test is no t yet approved or cleared by the [place] FDA and  has been authorized for detection and/or diagnosis of SARS-CoV-2 by FDA under an Emergency Use Authorization (EUA). This EUA will remain  in effect (meaning this test can be used) for the duration of the COVID-19 declaration under Section 564(b)(1) of the Act, 21 U.S.C. section 360bbb-3(b)(1), unless the authorization is terminated or revoked sooner.     Influenza Solae Norling by PCR NEGATIVE NEGATIVE Final   Influenza B by PCR NEGATIVE NEGATIVE Final    Comment: (NOTE) The Xpert Xpress SARS-CoV-2/FLU/RSV assay is intended as an aid in  the diagnosis of influenza from Nasopharyngeal swab specimens and  should not be used as Qamar Aughenbaugh sole basis for treatment. Nasal washings and  aspirates are unacceptable for Xpert Xpress SARS-CoV-2/FLU/RSV  testing.  Fact Sheet for Patients: PinkCheek.be  Fact Sheet for Healthcare Providers: GravelBags.it  This test is not yet approved or cleared by the [place] FDA and  has been authorized for detection and/or diagnosis of SARS-CoV-2 by  FDA under  an Emergency Use Authorization (EUA). This EUA will remain  in effect (meaning this test can be used) for the duration of the  Covid-19 declaration under Section 564(b)(1) of the Act, 21  U.S.C. section 360bbb-3(b)(1), unless the authorization is  terminated or revoked. Performed  at [facility], [address] [place], [place] [address]      Labs: Basic Metabolic Panel: Recent Labs  Lab 05/25/20 1715 05/26/20 0615  NA 136  --   K 4.1  --   CL 100  --   CO2 27  --   GLUCOSE 90  --   BUN 21  --   CREATININE 0.93 0.87  CALCIUM 8.8*  --    Liver Function Tests: Recent Labs  Lab 05/25/20 1715  AST 34  ALT 29  ALKPHOS 73  BILITOT 0.8  PROT 7.1  ALBUMIN 4.4   No results for input(s): LIPASE, AMYLASE in the last 168 hours. No results for input(s): AMMONIA in the last 168 hours. CBC: Recent Labs  Lab 05/25/20 1715 05/26/20 0615  WBC 7.8 5.6  NEUTROABS 4.6  --   HGB 13.9 13.8  HCT 41.8 40.7  MCV 92.1 91.9  PLT 229 211   Cardiac Enzymes: No results for input(s): CKTOTAL, CKMB, CKMBINDEX, TROPONINI in the last 168 hours. BNP: BNP (last 3 results) No results for input(s): BNP in the last 8760 hours.  ProBNP (last 3 results) No results for input(s): PROBNP in the last 8760 hours.  CBG: No results for input(s): GLUCAP in the last 168 hours.     Signed:  Fayrene Helper MD.  [facility] 05/27/2020, 2:18 PM

## 2020-05-27 NOTE — Consult Note (Addendum)
[address]       [place] [address]             [place] Record #[id number] Date of Birth: [birth year]  Referring: Corey Skains, MD Primary Care: Tonia Ghent, MD Primary Cardiologist:Bruce Kelli Hope, MD  Chief Complaint:    New onset of angina  History of Present Illness:     Patient noted new onset of epigastric discomfort over the past several weeks.  First noted as he was walking across the parking lot to work.  Since he first noticed it, increasingly obvious.  Earlier this week he called his primary care doctor describing the symptoms and was referred to urgent care.  Ultimately he was admitted to [facility] troponin was very minimally elevated.  Yesterday he underwent cardiac catheterization by Dr. Nehemiah Massed, this revealed significant three-vessel disease.  Today he was transferred to [facility] for consideration of coronary artery bypass grafting.   He has a past  medical history for of coronary disease is based on coronary calcium score markedly elevated in 2012.  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   he has known history of dyslipidemia specifically elevated triglycerides. , hypertension, and gastroesophageal reflux.  He did have a normal stress test in 2010.    He presented to the ER with substernal chest pain that was noted to be worse with exertion.  He was admitted with slightly elevated troponin levels for further evaluation and treatment.  An echocardiogram showed normal LV ejection fraction of 60% with no evidence of significant valvular heart disease.     Current Activity/ Functional Status: Patient is independent with mobility/ambulation, transfers, ADL's, IADL's.   Zubrod Score: At the time of surgery this patient's most appropriate activity status/level should be described as: [x]     0    Normal activity, no symptoms []     1    Restricted in  physical strenuous activity but ambulatory, able to do out light work []     2    Ambulatory and capable of self care, unable to do work activities, up and about                 more than 50%  Of the time                            []     3    Only limited self care, in bed greater than 50% of waking hours []     4    Completely disabled, no self care, confined to bed or chair []     5    Moribund  Past Medical History:  Diagnosis Date  . Allergy   . Anginal pain ([place])   . BPH (benign prostatic hypertrophy) 6/07  . CAD (coronary artery disease) 2010   dx made by coronary calcium scoring, Normal stress test in 2010.   Marland Kitchen GERD (gastroesophageal reflux disease) 12/05  . HLD (hyperlipidemia) 3/99  . HTN (hypertension) 2002   no meds  . Left rotator cuff tear 08/12/2011   Per Dr. Mardelle Matte 2013 L rotator cuff tear on MRI 2013   . Right rotator cuff tear 05/24/2013  . Wears glasses     Past Surgical History:  Procedure Laterality Date  . admitted to [facility] chest pain  12/15/05  Dr. Clayborn Bigness  . BLEPHAROPLASTY  10/11   Dr. Loletta Specter   . bone spur removed Right   . COLONOSCOPY  09/24/04   nml; Dr. Epifanio Lesches   . EGD esophagitis by bx  01/26/06   gastritis bx neg h-pylori  . POLYPECTOMY    . right and left prostate needle bx  05/02/03   acute inflammation; Dr. Gaynelle Arabian   . SHOULDER ARTHROSCOPY W/ ROTATOR CUFF REPAIR  12/2011   left  . SHOULDER ARTHROSCOPY WITH ROTATOR CUFF REPAIR AND SUBACROMIAL DECOMPRESSION Right 05/24/2013   Procedure: RIGHT SHOULDER ARTHROSCOPY WITH ROTATOR CUFF REPAIR AND SUBACROMIAL DECOMPRESSION AND PARTIAL ACROMIOPLSTY WITH CORACOACROMIAL RELEASE, DISTAL CLAVICULECTOMY, LABRIAL DEBRIDEMENT;  Surgeon: Johnny Bridge, MD;  Location: [facility];  Service: Orthopedics;  Laterality: Right;  . spect ETT nml  12/16/05  . UPPER GASTROINTESTINAL ENDOSCOPY    . WISDOM TOOTH EXTRACTION      Social History   Tobacco Use  Smoking Status Never Smoker  Smokeless Tobacco  Never Used    Social History   Substance and Sexual Activity  Alcohol Use Yes  . Alcohol/week: 0.0 standard drinks   Comment: beer, occ     Allergies  Allergen Reactions  . Ciprofloxacin Hcl Rash  . Quinolones Rash    Current Facility-Administered Medications  Medication Dose Route Frequency Provider Last Rate Last Admin  . acetaminophen (TYLENOL) tablet 650 mg  650 mg Oral Q4H PRN Cheryln Manly, NP      . Derrill Memo ON 05/28/2020] aspirin EC tablet 81 mg  81 mg Oral Daily Reino Bellis B, NP      . atorvastatin (LIPITOR) tablet 80 mg  80 mg Oral q1800 Reino Bellis B, NP   80 mg at 05/27/20 1700  . [START ON 05/28/2020] finasteride (PROSCAR) tablet 5 mg  5 mg Oral Daily Reino Bellis B, NP      . heparin ADULT infusion 100 units/mL (25000 units/256mL sodium chloride 0.45%)  1,000 Units/hr Intravenous Continuous Sherren Mocha, MD 10 mL/hr at 05/27/20 1752 1,000 Units/hr at 05/27/20 1752  . icosapent Ethyl (VASCEPA) 1 g capsule 1 g  1 g Oral QID Reino Bellis B, NP   1 g at 05/27/20 1754  . [START ON 05/28/2020] metoprolol succinate (TOPROL-XL) 24 hr tablet 25 mg  25 mg Oral Daily Reino Bellis B, NP      . nitroGLYCERIN (NITROSTAT) SL tablet 0.4 mg  0.4 mg Sublingual Q5 Min x 3 PRN Reino Bellis B, NP      . ondansetron [facility]) injection 4 mg  4 mg Intravenous Q6H PRN Cheryln Manly, NP        Facility-Administered Medications Prior to Admission  Medication Dose Route Frequency Provider Last Rate Last Admin  . betamethasone acetate-betamethasone sodium phosphate (CELESTONE) injection 3 mg  3 mg Intramuscular Once Edrick Kins, DPM       Medications Prior to Admission  Medication Sig Dispense Refill Last Dose  . alfuzosin (UROXATRAL) 10 MG 24 hr tablet Take 1 tablet (10 mg total) by mouth daily with breakfast. 90 tablet 3   . aspirin 81 MG EC tablet Take 81 mg by mouth daily.       . Azelastine-Fluticasone 137-50 MCG/ACT SUSP Place 1 spray into both  nostrils 2 (two) times daily.     . calcium citrate-vitamin D (CITRACAL+D) 315-200 MG-UNIT tablet Take 1-2 tablets by mouth 2 (two) times daily.     . Cholecalciferol (VITAMIN D) 2000 UNITS CAPS Take 1 capsule by  mouth daily.       . clindamycin (CLEOCIN T) 1 % external solution Apply topically 2 (two) times daily as needed.     . Cyanocobalamin (VITAMIN B-12) 5000 MCG SUBL Place 1 tablet under the tongue daily.     . finasteride (PROSCAR) 5 MG tablet Take 1 tablet (5 mg total) by mouth daily. 90 tablet 3   . Icosapent Ethyl (VASCEPA) 1 G CAPS Take 1 tablet by mouth 4 (four) times daily.     Derrill Memo ON 05/28/2020] metoprolol succinate (TOPROL-XL) 25 MG 24 hr tablet Take 1 tablet (25 mg total) by mouth daily. 30 tablet 0   . Multiple Vitamin (MULTIVITAMIN) tablet Take 1 tablet by mouth daily.       . rosuvastatin (CRESTOR) 5 MG tablet Take 5 mg by mouth daily.       Family History  Problem Relation Age of Onset  . Stroke Mother   . Glaucoma Other        grandmother at 64  . Barrett's esophagus Maternal Uncle   . Prostate cancer Neg Hx   . Colon cancer Neg Hx   . Colon polyps Neg Hx   . Esophageal cancer Neg Hx   . Rectal cancer Neg Hx   . Stomach cancer Neg Hx      Review of Systems:   Pertinent items are noted in HPI.     Cardiac Review of Systems: Y or  [    ]= no  Chest Pain [  y  ]  Resting SOB [n   ] Exertional SOB  Blue.Reese  ]  Orthopnea [ n ]   Pedal Edema [ n  ]    Palpitations [ n ] Syncope  [ n ]   Presyncope [  n ]  General Review of Systems: [Y] = yes [  ]=no Constitional: recent weight change [  ]; anorexia [  ]; fatigue [  ]; nausea [  ]; night sweats [  ]; fever [  ]; or chills [  ]                                                               Dental: Last Dentist visit:   Eye : blurred vision [  ]; diplopia [   ]; vision changes [  ];  Amaurosis fugax[  ]; Resp: cough [  ];  wheezing[  ];  hemoptysis[  ]; shortness of breath[  ]; paroxysmal nocturnal dyspnea[  ];  dyspnea on exertion[  ]; or orthopnea[  ];  GI:  gallstones[  ], vomiting[  ];  dysphagia[  ]; melena[  ];  hematochezia [  ]; heartburn[  ];   Hx of  Colonoscopy[  ]; GU: kidney stones [  ]; hematuria[  ];   dysuria [  ];  nocturia[  ];  history of     obstruction [  ]; urinary frequency [  ]             Skin: rash, swelling[  ];, hair loss[  ];  peripheral edema[  ];  or itching[  ]; Musculosketetal: myalgias[  ];  joint swelling[  ];  joint erythema[  ];  joint pain[  ];  back pain[  ];  Heme/Lymph: bruising[  ];  bleeding[  ];  anemia[  ];  Neuro: TIA[  ];  headaches[  ];  stroke[  ];  vertigo[  ];  seizures[  ];   paresthesias[  ];  difficulty walking[  ];  Psych:depression[  ]; anxiety[  ];  Endocrine: diabetes[n  ];  thyroid dysfunction[n  ];             Immunization:  Flu  [?  ]   Pneumococcal  ?    Covid  [  y   ]      Physical Exam: BP (!) 145/86 (BP Location: Left Arm)   Pulse 61   Temp 98.3 F (36.8 C) (Oral)   Resp 18   SpO2 96%    General appearance: alert, cooperative and no distress Head: Normocephalic, without obvious abnormality, atraumatic Neck: no adenopathy, no carotid bruit, no JVD, supple, symmetrical, trachea midline and thyroid not enlarged, symmetric, no tenderness/mass/nodules Lymph nodes: Cervical, supraclavicular, and axillary nodes normal. Resp: clear to auscultation bilaterally Back: symmetric, no curvature. ROM normal. No CVA tenderness. Cardio: regular rate and rhythm, S1, S2 normal, no murmur, click, rub or gallop GI: soft, non-tender; bowel sounds normal; no masses,  no organomegaly Extremities: extremities normal, atraumatic, no cyanosis or edema and Homans sign is negative, no sign of DVT Neurologic: Grossly normal  Diagnostic Studies & Laboratory data:     Recent Radiology Findings:   DG Chest 2 View  Result Date: 05/25/2020 CLINICAL DATA:  Chest pain EXAM: CHEST - 2 VIEW COMPARISON:  Chest x-ray 12/15/2005 FINDINGS: The heart size and  mediastinal contours are within normal limits. Left base compressive atelectasis. No focal consolidation. No pulmonary edema. Blunting of bilateral costophrenic angles with possible trace pleural effusions bilaterally. No pneumothorax. No acute osseous abnormality. Multilevel degenerative changes of the spine. IMPRESSION: 1. Blunting of bilateral costophrenic angles with possible trace pleural effusions bilaterally. 2. Otherwise no acute cardiopulmonary abnormality. Electronically Signed   By: Iven Finn M.D.   On: 05/25/2020 19:52   CARDIAC CATHETERIZATION  Result Date: 05/26/2020  Ost LAD to Prox LAD lesion is 95% stenosed.  Prox LAD to Mid LAD lesion is 75% stenosed with 75% stenosed side branch in 1st Sept.  Ramus lesion is 90% stenosed.  Ost Cx to Prox Cx lesion is 99% stenosed.  Mid Cx lesion is 100% stenosed.  Prox RCA lesion is 100% stenosed.  Mid RCA lesion is 100% stenosed.  74 year old male with severe hyperlipidemia hypertension with progressive symptoms of shortness of breath anginal symptoms over the last several weeks with some chest pain at rest and a slight elevation of troponin Cardiogram showing normal LV systolic function with ejection fraction to 60% and no evidence of significant valvular heart disease Left ventricle angiogram showing normal LV systolic function Patient has critical distal left main ostial LAD ostial ramus ostial right coronary artery and ostial circumflex artery stenosis Plan Continue nitrates for chest discomfort Aspirin beta-blocker for hypertension control and cardiovascular disease Discussion for transfer for coronary artery bypass graft Cardiac rehabilitation   ECHOCARDIOGRAM COMPLETE  Result Date: 05/26/2020    ECHOCARDIOGRAM REPORT   Patient Name:   [place] Date of Exam: 05/26/2020 Medical Rec #:  [id number]        Height:       66.0 in Accession #:    [id number]       Weight:       200.0 lb Date of Birth:  Aug 15, 1945       BSA:  2.000  m Patient Age:    57 years         BP:           178/85 mmHg Patient Gender: M                HR:           49 bpm. Exam Location:  [facility] Procedure: 2D Echo, Cardiac Doppler and Color Doppler Indications:     Chest pain 786.50  History:         Patient has no prior history of Echocardiogram examinations.                  CAD; Risk Factors:Hypertension and Dyslipidemia.  Sonographer:     Sherrie Sport RDCS (AE) Referring Phys:  [place] Diagnosing Phys: Serafina Royals MD IMPRESSIONS  1. Left ventricular ejection fraction, by estimation, is 60 to 65%. The left ventricle has normal function. The left ventricle has no regional wall motion abnormalities. Left ventricular diastolic parameters were normal.  2. Right ventricular systolic function is normal. The right ventricular size is normal.  3. The mitral valve is normal in structure. Trivial mitral valve regurgitation.  4. The aortic valve is normal in structure. Aortic valve regurgitation is not visualized. FINDINGS  Left Ventricle: Left ventricular ejection fraction, by estimation, is 60 to 65%. The left ventricle has normal function. The left ventricle has no regional wall motion abnormalities. The left ventricular internal cavity size was normal in size. There is  no left ventricular hypertrophy. Left ventricular diastolic parameters were normal. Right Ventricle: The right ventricular size is normal. No increase in right ventricular wall thickness. Right ventricular systolic function is normal. Left Atrium: Left atrial size was normal in size. Right Atrium: Right atrial size was normal in size. Pericardium: There is no evidence of pericardial effusion. Mitral Valve: The mitral valve is normal in structure. Trivial mitral valve regurgitation. Tricuspid Valve: The tricuspid valve is normal in structure. Tricuspid valve regurgitation is trivial. Aortic Valve: The aortic valve is normal in structure. Aortic valve regurgitation is not visualized. Aortic valve  mean gradient measures 2.0 mmHg. Aortic valve peak gradient measures 3.9 mmHg. Aortic valve area, by VTI measures 4.73 cm. Pulmonic Valve: The pulmonic valve was normal in structure. Pulmonic valve regurgitation is not visualized. Aorta: The aortic root and ascending aorta are structurally normal, with no evidence of dilitation. IAS/Shunts: No atrial level shunt detected by color flow Doppler.  LEFT VENTRICLE PLAX 2D LVIDd:         4.36 cm LVIDs:         2.80 cm LV PW:         1.32 cm LV IVS:        1.21 cm LVOT diam:     2.30 cm LV SV:         95 LV SV Index:   48 LVOT Area:     4.15 cm  RIGHT VENTRICLE RV Basal diam:  3.72 cm RV S prime:     12.20 cm/s TAPSE (M-mode): 3.1 cm LEFT ATRIUM             Index       RIGHT ATRIUM           Index LA diam:        3.80 cm 1.90 cm/m  RA Area:     17.70 cm LA Vol (A2C):   64.8 ml 32.40 ml/m RA Volume:   48.70 ml  24.35 ml/m LA Vol (A4C):   58.4 ml 29.20 ml/m LA Biplane Vol: 61.7 ml 30.85 ml/m  AORTIC VALVE                   PULMONIC VALVE AV Area (Vmax):    3.60 cm    PV Vmax:        0.57 m/s AV Area (Vmean):   4.05 cm    PV Peak grad:   1.3 mmHg AV Area (VTI):     4.73 cm    RVOT Peak grad: 2 mmHg AV Vmax:           98.80 cm/s AV Vmean:          64.750 cm/s AV VTI:            0.201 m AV Peak Grad:      3.9 mmHg AV Mean Grad:      2.0 mmHg LVOT Vmax:         85.50 cm/s LVOT Vmean:        63.100 cm/s LVOT VTI:          0.229 m LVOT/AV VTI ratio: 1.14  AORTA Ao Root diam: 3.10 cm MITRAL VALVE                TRICUSPID VALVE MV Area (PHT): 2.39 cm     TR Peak grad:   13.1 mmHg MV Decel Time: 318 msec     TR Vmax:        181.00 cm/s MV E velocity: 84.00 cm/s MV A velocity: 116.00 cm/s  SHUNTS MV E/A ratio:  0.72         Systemic VTI:  0.23 m                             Systemic Diam: 2.30 cm Serafina Royals MD Electronically signed by Serafina Royals MD Signature Date/Time: 05/26/2020/1:42:02 PM    Final      I have independently reviewed the above  cath films and  reviewed the findings with the  patient .    Recent Lab Findings: Lab Results  Component Value Date   WBC 5.6 05/26/2020   HGB 13.8 05/26/2020   HCT 40.7 05/26/2020   PLT 211 05/26/2020   GLUCOSE 90 05/25/2020   CHOL 143 05/26/2020   TRIG 141 05/26/2020   HDL 52 05/26/2020   LDLCALC 63 05/26/2020   ALT 29 05/25/2020   AST 34 05/25/2020   NA 136 05/25/2020   K 4.1 05/25/2020   CL 100 05/25/2020   CREATININE 0.87 05/26/2020   BUN 21 05/25/2020   CO2 27 05/25/2020   TSH 2.478 02/05/2003      Assessment / Plan:   #1 new onset of angina with significant three-vessel coronary artery disease with left main equivalent and preserved LV function #2 familial hyperlipidemia with well-controlled lipids  I seen the patient reviewed his history and echo and cardiac cath films.  Patient has severe multivessel disease with significant distal left main disease and proximal LAD the right is totally occluded with collateral filling.  With his complex disease coronary artery bypass grafting offers the best long-term treatment both for preservation of LV function and relief of symptoms. Risks and options of surgery and expectations typical postop progression been discussed with he and his wife.  We will obtain carotid Dopplers, and plan to proceed with coronary artery bypass grafting Friday morning November 5.  Grace Isaac MD      [address] [place] [address] Office [phone]     05/27/2020 6:54 PM

## 2020-05-27 NOTE — Consult Note (Deleted)
[address]       [place] [address]             [phone]        Welles M Mahlum [facility] Medical Record #[id number] Date of Birth: 074/21/1947  Referring: Corey Skains, MD Primary Care: Tonia Ghent, MD Primary Cardiologist:Bruce Kelli Hope, MD  Chief Complaint:    Chief Complaint  Patient presents with  . Shortness of Breath  . Chest Pain    History of Present Illness:        Current Activity/ Functional Status: Patient is independent with mobility/ambulation, transfers, ADL's, IADL's.   Zubrod Score: At the time of surgery this patient's most appropriate activity status/level should be described as: []     0    Normal activity, no symptoms []     1    Restricted in physical strenuous activity but ambulatory, able to do out light work []     2    Ambulatory and capable of self care, unable to do work activities, up and about                 more than 50%  Of the time                            []     3    Only limited self care, in bed greater than 50% of waking hours []     4    Completely disabled, no self care, confined to bed or chair []     5    Moribund  Past Medical History:  Diagnosis Date  . Allergy   . Anginal pain ([place])   . BPH (benign prostatic hypertrophy) 6/07  . CAD (coronary artery disease) 2010   dx made by coronary calcium scoring, Normal stress test in 2010.   Marland Kitchen GERD (gastroesophageal reflux disease) 12/05  . HLD (hyperlipidemia) 3/99  . HTN (hypertension) 2002   no meds  . Left rotator cuff tear 08/12/2011   Per Dr. Mardelle Matte 2013 L rotator cuff tear on MRI 2013   . Right rotator cuff tear 05/24/2013  . Wears glasses     Past Surgical History:  Procedure Laterality Date  . admitted to [facility] chest pain  12/15/05   Dr. Clayborn Bigness  . BLEPHAROPLASTY  10/11   Dr. Loletta Specter   . bone spur removed Right   . COLONOSCOPY  09/24/04   nml; Dr. Epifanio Lesches   . EGD esophagitis by bx  01/26/06   gastritis bx neg h-pylori  .  POLYPECTOMY    . right and left prostate needle bx  05/02/03   acute inflammation; Dr. Gaynelle Arabian   . SHOULDER ARTHROSCOPY W/ ROTATOR CUFF REPAIR  12/2011   left  . SHOULDER ARTHROSCOPY WITH ROTATOR CUFF REPAIR AND SUBACROMIAL DECOMPRESSION Right 05/24/2013   Procedure: RIGHT SHOULDER ARTHROSCOPY WITH ROTATOR CUFF REPAIR AND SUBACROMIAL DECOMPRESSION AND PARTIAL ACROMIOPLSTY WITH CORACOACROMIAL RELEASE, DISTAL CLAVICULECTOMY, LABRIAL DEBRIDEMENT;  Surgeon: Johnny Bridge, MD;  Location: [facility];  Service: Orthopedics;  Laterality: Right;  . spect ETT nml  12/16/05  . UPPER GASTROINTESTINAL ENDOSCOPY    . WISDOM TOOTH EXTRACTION      Social History   Tobacco Use  Smoking Status Never Smoker  Smokeless Tobacco Never Used    Social History   Substance and Sexual Activity  Alcohol Use Yes  . Alcohol/week: 0.0 standard drinks  Comment: beer, occ     Allergies  Allergen Reactions  . Ciprofloxacin Hcl Rash  . Quinolones Rash    Current Facility-Administered Medications  Medication Dose Route Frequency Provider Last Rate Last Admin  . 0.9 %  sodium chloride infusion   Intravenous Continuous Athena Masse, MD 75 mL/hr at 05/26/20 2146 New Bag at 05/26/20 2146  . acetaminophen (TYLENOL) tablet 650 mg  650 mg Oral Q4H PRN Athena Masse, MD      . acetaminophen (TYLENOL) tablet 650 mg  650 mg Oral Q4H PRN Corey Skains, MD      . alfuzosin (UROXATRAL) 24 hr tablet 10 mg  10 mg Oral q1800 Agbata, Tochukwu, MD   10 mg at 05/26/20 1838  . azelastine (ASTELIN) 0.1 % nasal spray 1 spray  1 spray Each Nare BID Athena Masse, MD   1 spray at 05/26/20 2138  . calcium-vitamin D (OSCAL WITH D) 500-200 MG-UNIT per tablet 1 tablet  1 tablet Oral BID Agbata, Tochukwu, MD   1 tablet at 05/27/20 1028  . cholecalciferol (VITAMIN D) tablet 2,000 Units  2,000 Units Oral Daily Agbata, Tochukwu, MD   2,000 Units at 05/27/20 1029  . enoxaparin (LOVENOX) injection 45 mg  0.5 mg/kg  Subcutaneous Q24H Lu Duffel, RPH      . finasteride (PROSCAR) tablet 5 mg  5 mg Oral q1800 Agbata, Tochukwu, MD   5 mg at 05/26/20 1838  . fluticasone (FLONASE) 50 MCG/ACT nasal spray 1 spray  1 spray Each Nare BID Athena Masse, MD   1 spray at 05/26/20 2138  . metoprolol succinate (TOPROL-XL) 24 hr tablet 25 mg  25 mg Oral Daily Agbata, Tochukwu, MD   25 mg at 05/27/20 1028  . multivitamin with minerals tablet 1 tablet  1 tablet Oral Daily Agbata, Tochukwu, MD   1 tablet at 05/27/20 1028  . omega-3 acid ethyl esters (LOVAZA) capsule 1 g  1 g Oral QID Lu Duffel, RPH   1 g at 05/27/20 1028  . ondansetron (ZOFRAN) injection 4 mg  4 mg Intravenous Q6H PRN Athena Masse, MD      . ondansetron [facility]) injection 4 mg  4 mg Intravenous Q6H PRN Corey Skains, MD      . rosuvastatin (CRESTOR) tablet 5 mg  5 mg Oral q1800 Agbata, Tochukwu, MD      . sodium chloride flush (NS) 0.9 % injection 3 mL  3 mL Intravenous Q12H Corey Skains, MD   3 mL at 05/26/20 2136  . vitamin B-12 (CYANOCOBALAMIN) tablet 5,000 mcg  5,000 mcg Oral Daily Agbata, Tochukwu, MD   74,000 mcg at 05/27/20 1029    Facility-Administered Medications Prior to Admission  Medication Dose Route Frequency Provider Last Rate Last Admin  . betamethasone acetate-betamethasone sodium phosphate (CELESTONE) injection 3 mg  3 mg Intramuscular Once Edrick Kins, DPM       Medications Prior to Admission  Medication Sig Dispense Refill Last Dose  . alfuzosin (UROXATRAL) 10 MG 24 hr tablet Take 1 tablet (10 mg total) by mouth daily with breakfast. 90 tablet 3 05/24/2020 at 1800  . aspirin 81 MG EC tablet Take 81 mg by mouth daily.     05/24/2020 at 1800  . Azelastine-Fluticasone 137-50 MCG/ACT SUSP Place 1 spray into both nostrils 2 (two) times daily.   05/25/2020 at 0700  . calcium citrate-vitamin D (CITRACAL+D) 315-200 MG-UNIT tablet Take 1-2 tablets by mouth 2 (two) times daily.  05/25/2020 at 0700  .  Cholecalciferol (VITAMIN D) 2000 UNITS CAPS Take 1 capsule by mouth daily.     05/25/2020 at 0700  . clindamycin (CLEOCIN T) 1 % external solution Apply topically 2 (two) times daily as needed.   prn at prn  . Cyanocobalamin (VITAMIN B-12) 5000 MCG SUBL Place 1 tablet under the tongue daily.   05/25/2020 at 0700  . finasteride (PROSCAR) 5 MG tablet Take 1 tablet (5 mg total) by mouth daily. 90 tablet 3 05/24/2020 at 1800  . ibuprofen (ADVIL,MOTRIN) 200 MG tablet Take 600 mg by mouth daily as needed for moderate pain.    prn at prn  . Icosapent Ethyl (VASCEPA) 1 G CAPS Take 1 tablet by mouth 4 (four) times daily.   05/25/2020 at 0700  . Multiple Vitamin (MULTIVITAMIN) tablet Take 1 tablet by mouth daily.     05/25/2020 at 0700  . rosuvastatin (CRESTOR) 5 MG tablet Take 5 mg by mouth daily.   05/24/2020 at 1800  . tadalafil (CIALIS) 5 MG tablet Take 1 tablet (5 mg total) by mouth daily. 90 tablet 3 Past Week at 1800    Family History  Problem Relation Age of Onset  . Stroke Mother   . Glaucoma Other        grandmother at 25  . Barrett's esophagus Maternal Uncle   . Prostate cancer Neg Hx   . Colon cancer Neg Hx   . Colon polyps Neg Hx   . Esophageal cancer Neg Hx   . Rectal cancer Neg Hx   . Stomach cancer Neg Hx      Review of Systems:   Pertinent items are noted in HPI.     Cardiac Review of Systems: Y or  [    ]= no  Chest Pain [    ]  Resting SOB [   ] Exertional SOB  [  ]  Orthopnea [  ]   Pedal Edema [   ]    Palpitations [  ] Syncope  [  ]   Presyncope [   ]  General Review of Systems: [Y] = yes [  ]=no Constitional: recent weight change [  ]; anorexia [  ]; fatigue [  ]; nausea [  ]; night sweats [  ]; fever [  ]; or chills [  ]                                                               Dental: Last Dentist visit:   Eye : blurred vision [  ]; diplopia [   ]; vision changes [  ];  Amaurosis fugax[  ]; Resp: cough [  ];  wheezing[  ];  hemoptysis[  ]; shortness of breath[  ];  paroxysmal nocturnal dyspnea[  ]; dyspnea on exertion[  ]; or orthopnea[  ];  GI:  gallstones[  ], vomiting[  ];  dysphagia[  ]; melena[  ];  hematochezia [  ]; heartburn[  ];   Hx of  Colonoscopy[  ]; GU: kidney stones [  ]; hematuria[  ];   dysuria [  ];  nocturia[  ];  history of     obstruction [  ]; urinary frequency [  ]  Skin: rash, swelling[  ];, hair loss[  ];  peripheral edema[  ];  or itching[  ]; Musculosketetal: myalgias[  ];  joint swelling[  ];  joint erythema[  ];  joint pain[  ];  back pain[  ];  Heme/Lymph: bruising[  ];  bleeding[  ];  anemia[  ];  Neuro: TIA[  ];  headaches[  ];  stroke[  ];  vertigo[  ];  seizures[  ];   paresthesias[  ];  difficulty walking[  ];  Psych:depression[  ]; anxiety[  ];  Endocrine: diabetes[  ];  thyroid dysfunction[  ];             Immunization:  Flu  [  ]   Pneumococcal      Covid  [     ]      Physical Exam: BP (!) 148/92 (BP Location: Left Arm)   Pulse (!) 54   Temp 97.9 F (36.6 C) (Oral)   Resp 18   Ht 5\' 6"  (1.676 m)   Wt 93.6 kg   SpO2 94%   BMI 33.31 kg/m      Diagnostic Studies & Laboratory data:     Recent Radiology Findings:   DG Chest 2 View  Result Date: 05/25/2020 CLINICAL DATA:  Chest pain EXAM: CHEST - 2 VIEW COMPARISON:  Chest x-ray 12/15/2005 FINDINGS: The heart size and mediastinal contours are within normal limits. Left base compressive atelectasis. No focal consolidation. No pulmonary edema. Blunting of bilateral costophrenic angles with possible trace pleural effusions bilaterally. No pneumothorax. No acute osseous abnormality. Multilevel degenerative changes of the spine. IMPRESSION: 1. Blunting of bilateral costophrenic angles with possible trace pleural effusions bilaterally. 2. Otherwise no acute cardiopulmonary abnormality. Electronically Signed   By: Iven Finn M.D.   On: 05/25/2020 19:52   CARDIAC CATHETERIZATION  Result Date: 05/26/2020  Ost LAD to Prox LAD lesion is 95% stenosed.   Prox LAD to Mid LAD lesion is 75% stenosed with 75% stenosed side branch in 1st Sept.  Ramus lesion is 90% stenosed.  Ost Cx to Prox Cx lesion is 99% stenosed.  Mid Cx lesion is 100% stenosed.  Prox RCA lesion is 100% stenosed.  Mid RCA lesion is 100% stenosed.  74 year old male with severe hyperlipidemia hypertension with progressive symptoms of shortness of breath anginal symptoms over the last several weeks with some chest pain at rest and a slight elevation of troponin Cardiogram showing normal LV systolic function with ejection fraction to 60% and no evidence of significant valvular heart disease Left ventricle angiogram showing normal LV systolic function Patient has critical distal left main ostial LAD ostial ramus ostial right coronary artery and ostial circumflex artery stenosis Plan Continue nitrates for chest discomfort Aspirin beta-blocker for hypertension control and cardiovascular disease Discussion for transfer for coronary artery bypass graft Cardiac rehabilitation   ECHOCARDIOGRAM COMPLETE  Result Date: 05/26/2020    ECHOCARDIOGRAM REPORT   Patient Name:   [place] Date of Exam: 05/26/2020 Medical Rec #:  [id number]        Height:       66.0 in Accession #:    [id number]       Weight:       200.0 lb Date of Birth:  [birth year]       BSA:          2.000 m Patient Age:    74 years         BP:  178/85 mmHg Patient Gender: M                HR:           49 bpm. Exam Location:  [facility] Procedure: 2D Echo, Cardiac Doppler and Color Doppler Indications:     Chest pain 786.50  History:         Patient has no prior history of Echocardiogram examinations.                  CAD; Risk Factors:Hypertension and Dyslipidemia.  Sonographer:     Sherrie Sport RDCS (AE) Referring Phys:  Hansen Diagnosing Phys: Serafina Royals MD IMPRESSIONS  1. Left ventricular ejection fraction, by estimation, is 60 to 65%. The left ventricle has normal function. The left ventricle has no regional  wall motion abnormalities. Left ventricular diastolic parameters were normal.  2. Right ventricular systolic function is normal. The right ventricular size is normal.  3. The mitral valve is normal in structure. Trivial mitral valve regurgitation.  4. The aortic valve is normal in structure. Aortic valve regurgitation is not visualized. FINDINGS  Left Ventricle: Left ventricular ejection fraction, by estimation, is 60 to 65%. The left ventricle has normal function. The left ventricle has no regional wall motion abnormalities. The left ventricular internal cavity size was normal in size. There is  no left ventricular hypertrophy. Left ventricular diastolic parameters were normal. Right Ventricle: The right ventricular size is normal. No increase in right ventricular wall thickness. Right ventricular systolic function is normal. Left Atrium: Left atrial size was normal in size. Right Atrium: Right atrial size was normal in size. Pericardium: There is no evidence of pericardial effusion. Mitral Valve: The mitral valve is normal in structure. Trivial mitral valve regurgitation. Tricuspid Valve: The tricuspid valve is normal in structure. Tricuspid valve regurgitation is trivial. Aortic Valve: The aortic valve is normal in structure. Aortic valve regurgitation is not visualized. Aortic valve mean gradient measures 2.0 mmHg. Aortic valve peak gradient measures 3.9 mmHg. Aortic valve area, by VTI measures 4.73 cm. Pulmonic Valve: The pulmonic valve was normal in structure. Pulmonic valve regurgitation is not visualized. Aorta: The aortic root and ascending aorta are structurally normal, with no evidence of dilitation. IAS/Shunts: No atrial level shunt detected by color flow Doppler.  LEFT VENTRICLE PLAX 2D LVIDd:         4.36 cm LVIDs:         2.80 cm LV PW:         1.32 cm LV IVS:        1.21 cm LVOT diam:     2.30 cm LV SV:         95 LV SV Index:   48 LVOT Area:     4.15 cm  RIGHT VENTRICLE RV Basal diam:  3.72 cm RV  S prime:     12.20 cm/s TAPSE (M-mode): 3.1 cm LEFT ATRIUM             Index       RIGHT ATRIUM           Index LA diam:        3.80 cm 1.90 cm/m  RA Area:     17.70 cm LA Vol (A2C):   64.8 ml 32.40 ml/m RA Volume:   48.70 ml  24.35 ml/m LA Vol (A4C):   58.4 ml 29.20 ml/m LA Biplane Vol: 61.7 ml 30.85 ml/m  AORTIC VALVE  PULMONIC VALVE AV Area (Vmax):    3.60 cm    PV Vmax:        0.57 m/s AV Area (Vmean):   4.05 cm    PV Peak grad:   1.3 mmHg AV Area (VTI):     4.73 cm    RVOT Peak grad: 2 mmHg AV Vmax:           98.80 cm/s AV Vmean:          64.750 cm/s AV VTI:            0.201 m AV Peak Grad:      3.9 mmHg AV Mean Grad:      2.0 mmHg LVOT Vmax:         85.50 cm/s LVOT Vmean:        63.100 cm/s LVOT VTI:          0.229 m LVOT/AV VTI ratio: 1.14  AORTA Ao Root diam: 3.10 cm MITRAL VALVE                TRICUSPID VALVE MV Area (PHT): 2.39 cm     TR Peak grad:   13.1 mmHg MV Decel Time: 318 msec     TR Vmax:        181.00 cm/s MV E velocity: 84.00 cm/s MV A velocity: 116.00 cm/s  SHUNTS MV E/A ratio:  0.72         Systemic VTI:  0.23 m                             Systemic Diam: 2.30 cm Serafina Royals MD Electronically signed by Serafina Royals MD Signature Date/Time: 05/26/2020/1:42:02 PM    Final        Recent Lab Findings: Lab Results  Component Value Date   WBC 5.6 05/26/2020   HGB 13.8 05/26/2020   HCT 40.7 05/26/2020   PLT 211 05/26/2020   GLUCOSE 90 05/25/2020   CHOL 143 05/26/2020   TRIG 141 05/26/2020   HDL 52 05/26/2020   LDLCALC 63 05/26/2020   ALT 29 05/25/2020   AST 34 05/25/2020   NA 136 05/25/2020   K 4.1 05/25/2020   CL 100 05/25/2020   CREATININE 0.87 05/26/2020   BUN 21 05/25/2020   CO2 27 05/25/2020   TSH 2.478 02/05/2003      Assessment / Plan:          I  spent  minutes counseling the patient face to face.   Grace Isaac MD      [address] [place] [address] Office [phone]     05/27/2020 11:26  AM

## 2020-05-27 NOTE — Progress Notes (Signed)
Pt received from [facility] via carelink to 4e16. Oriented x4. CHG bath complete. VSS.  Tele box connected, CCMD called. Call bell in reach, will continue to monitor.  Arletta Bale, RN

## 2020-05-27 NOTE — Progress Notes (Signed)
ANTICOAGULATION CONSULT NOTE - Initial Consult  Pharmacy Consult for IV Heparin Indication: chest pain/ACS  Allergies  Allergen Reactions  . Ciprofloxacin Hcl Rash  . Quinolones Rash    Patient Measurements: Total Body Weight: 93.6 kg Height: 66 inches Heparin Dosing Weight: 83 kg  Vital Signs: Temp: 98.3 F (36.8 C) (11/03 1558) Temp Source: Oral (11/03 1558) BP: 145/86 (11/03 1558) Pulse Rate: 61 (11/03 1558)  Labs: Recent Labs    05/25/20 1715 05/25/20 1912 05/25/20 2103 05/26/20 0615  HGB 13.9  --   --  13.8  HCT 41.8  --   --  40.7  PLT 229  --   --  211  CREATININE 0.93  --   --  0.87  TROPONINIHS 38* 35* 31*  --     Estimated Creatinine Clearance: 81 mL/min (by C-G formula based on SCr of 0.87 mg/dL).   Medical History: Past Medical History:  Diagnosis Date  . Allergy   . Anginal pain ([place])   . BPH (benign prostatic hypertrophy) 6/07  . CAD (coronary artery disease) 2010   dx made by coronary calcium scoring, Normal stress test in 2010.   Marland Kitchen GERD (gastroesophageal reflux disease) 12/05  . HLD (hyperlipidemia) 3/99  . HTN (hypertension) 2002   no meds  . Left rotator cuff tear 08/12/2011   Per Dr. Mardelle Matte 2013 L rotator cuff tear on MRI 2013   . Right rotator cuff tear 05/24/2013  . Wears glasses     Assessment: 74 yr old male with newly-diagnosed severe 3-vessel CAD transferring from [facility] to [facility] for CABG evaluation. Pharmacy was consulted for heparin dosing. Pt did not receive heparin at [facility], per RN, and pt was on no anticoagulants PTA to [facility], per med rec. Pt rec'd enoxaparin 45 mg SQ today at 1406 PM at [facility] for VTE prophylaxis.  H/H, platelets WNL; CrCl 81 ml/min  Goal of Therapy:  Heparin level 0.3-0.7 units/ml Monitor platelets by anticoagulation protocol: Yes   Plan:  Heparin 2000 units IV bolus X 1 (giving reduced bolus since pt rec'd enoxaparin dose for VTE prophylaxis this afternoon), followed by heparin infusion at  1000 units/hr Check heparin level in ~8 hrs Monitor daily heparin level, CBC Monitor for signs/symptoms of bleeding F/U CABG evaluation  Gillermina Hu, PharmD, BCPS, [facility] Clinical Pharmacist 05/27/2020,4:27 PM

## 2020-05-27 NOTE — H&P (Signed)
Cardiology Admission History and Physical:   Patient ID: [name] MRN: [id number]; DOB: Feb 21, 1946   Admission date: 05/27/2020  Primary Care Provider: Tonia Ghent, MD [facility] Cardiologist: Corey Skains, MD  [place] Electrophysiologist:  None   Chief Complaint:  Chest pain  Patient Profile:   Clayton Powell is a 74 y.o. male with newly diagnosed severe three-vessel coronary artery disease presenting in transfer for CABG evaluation  History of Present Illness:   [name] is a 74 year old gentleman with familial dyslipidemia and coronary atherosclerosis by CT scanning in the past.  He has developed recent onset of exertional chest discomfort that occurs with low to moderate level activity.  He describes a pressure-like sensation in the middle of the chest, nonradiating.  There is no associated dyspnea, lightheadedness, or syncope.  He has no heart palpitations, orthopnea, or PND.  He underwent diagnostic cardiac catheterization yesterday by Dr. Nehemiah Massed at [facility].  This demonstrated severe ostial LAD stenosis, severe proximal left circumflex stenosis, total occlusion of the mid circumflex, severe ramus intermedius stenosis, and total occlusion of a nondominant RCA with right to right collaterals.  Because of very high risk coronary anatomy with severe left main equivalent disease, he is transferred for consideration of CABG.  The patient denies any resting angina.  His symptoms have progressed over the past month.   Past Medical History:  Diagnosis Date  . Allergy   . Anginal pain ([place])   . BPH (benign prostatic hypertrophy) 6/07  . CAD (coronary artery disease) 2010   dx made by coronary calcium scoring, Normal stress test in 2010.   Marland Kitchen GERD (gastroesophageal reflux disease) 12/05  . HLD (hyperlipidemia) 3/99  . HTN (hypertension) 2002   no meds  . Left rotator cuff tear 08/12/2011   Per Dr. Mardelle Matte 2013 L rotator cuff tear on  MRI 2013   . Right rotator cuff tear 05/24/2013  . Wears glasses     Past Surgical History:  Procedure Laterality Date  . admitted to [facility] chest pain  12/15/05   Dr. Clayborn Bigness  . BLEPHAROPLASTY  10/11   Dr. Loletta Specter   . bone spur removed Right   . COLONOSCOPY  09/24/04   nml; Dr. Epifanio Lesches   . EGD esophagitis by bx  01/26/06   gastritis bx neg h-pylori  . POLYPECTOMY    . right and left prostate needle bx  05/02/03   acute inflammation; Dr. Gaynelle Arabian   . SHOULDER ARTHROSCOPY W/ ROTATOR CUFF REPAIR  12/2011   left  . SHOULDER ARTHROSCOPY WITH ROTATOR CUFF REPAIR AND SUBACROMIAL DECOMPRESSION Right 05/24/2013   Procedure: RIGHT SHOULDER ARTHROSCOPY WITH ROTATOR CUFF REPAIR AND SUBACROMIAL DECOMPRESSION AND PARTIAL ACROMIOPLSTY WITH CORACOACROMIAL RELEASE, DISTAL CLAVICULECTOMY, LABRIAL DEBRIDEMENT;  Surgeon: Johnny Bridge, MD;  Location: [place];  Service: Orthopedics;  Laterality: Right;  . spect ETT nml  12/16/05  . UPPER GASTROINTESTINAL ENDOSCOPY    . WISDOM TOOTH EXTRACTION       Medications Prior to Admission: Prior to Admission medications   Medication Sig Start Date End Date Taking? Authorizing Provider  alfuzosin (UROXATRAL) 10 MG 24 hr tablet Take 1 tablet (10 mg total) by mouth daily with breakfast. 04/09/20   Billey Co, MD  aspirin 81 MG EC tablet Take 81 mg by mouth daily.      [provider]  Azelastine-Fluticasone 137-50 MCG/ACT SUSP Place 1 spray into both nostrils 2 (two) times daily. 12/16/19   [provider]  calcium citrate-vitamin D (CITRACAL+D) 315-200 MG-UNIT tablet Take 1-2 tablets by mouth 2 (two) times daily.    [provider]  Cholecalciferol (VITAMIN D) 2000 UNITS CAPS Take 1 capsule by mouth daily.      [provider]  clindamycin (CLEOCIN T) 1 % external solution Apply topically 2 (two) times daily as needed. 01/09/20   [provider]  Cyanocobalamin (VITAMIN B-12) 5000 MCG SUBL Place 1  tablet under the tongue daily.    [provider]  finasteride (PROSCAR) 5 MG tablet Take 1 tablet (5 mg total) by mouth daily. 04/09/20   Billey Co, MD  Icosapent Ethyl (VASCEPA) 1 G CAPS Take 1 tablet by mouth 4 (four) times daily.    [provider]  metoprolol succinate (TOPROL-XL) 25 MG 24 hr tablet Take 1 tablet (25 mg total) by mouth daily. 05/28/20 06/27/20  Elodia Florence., MD  Multiple Vitamin (MULTIVITAMIN) tablet Take 1 tablet by mouth daily.      [provider]  rosuvastatin (CRESTOR) 5 MG tablet Take 5 mg by mouth daily.    [provider]     Allergies:    Allergies  Allergen Reactions  . Ciprofloxacin Hcl Rash  . Quinolones Rash    Social History:   Social History   Socioeconomic History  . Marital status: Married    Spouse name: Not on file  . Number of children: Not on file  . Years of education: Not on file  . Highest education level: Not on file  Occupational History  . Not on file  Tobacco Use  . Smoking status: Never Smoker  . Smokeless tobacco: Never Used  Substance and Sexual Activity  . Alcohol use: Yes    Alcohol/week: 0.0 standard drinks    Comment: beer, occ  . Drug use: No  . Sexual activity: Yes  Other Topics Concern  . 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    Plays guitar   Works out regularly.   Social Determinants of Health   Financial Resource Strain:   . Difficulty of Paying Living Expenses: Not on file  Food Insecurity:   . Worried About Charity fundraiser in the Last Year: Not on file  . Ran Out of Food in the Last Year: Not on file  Transportation Needs:   . Lack of Transportation (Medical): Not on file  . Lack of Transportation (Non-Medical): Not on file  Physical Activity:   . Days of Exercise per Week: Not on file  . Minutes of Exercise per Session: Not on file   Stress:   . Feeling of Stress : Not on file  Social Connections:   . Frequency of Communication with Friends and Family: Not on file  . Frequency of Social Gatherings with Friends and Family: Not on file  . Attends Religious Services: Not on file  . Active Member of Clubs or Organizations: Not on file  . Attends Archivist Meetings: Not on file  . Marital Status: Not on file  Intimate Partner Violence:   . Fear of Current or Ex-Partner: Not on file  . Emotionally Abused: Not on file  . Physically Abused: Not on file  . Sexually Abused: Not on file    Family History:   The patient's family history includes Barrett's esophagus in his maternal  uncle; Glaucoma in an other family member; Stroke in his mother. There is no history of Prostate cancer, Colon cancer, Colon polyps, Esophageal cancer, Rectal cancer, or Stomach cancer.    ROS:  Please see the history of present illness.  Positive for BPH symptoms.  All other ROS reviewed and negative.     Physical Exam/Data:   Vitals:   05/27/20 1558  BP: (!) 145/86  Pulse: 61  Resp: 18  Temp: 98.3 F (36.8 C)  TempSrc: Oral  SpO2: 96%   No intake or output data in the 24 hours ending 05/27/20 1620 Last 3 Weights 05/27/2020 05/26/2020 05/25/2020  Weight (lbs) 206 lb 5.6 oz 200 lb 14.4 oz 200 lb  Weight (kg) 93.6 kg 91.128 kg 90.719 kg     There is no height or weight on file to calculate BMI.  General:  Well nourished, well developed, in no acute distress HEENT: normal Lymph: no adenopathy Neck: no JVD Endocrine:  No thryomegaly Vascular: No carotid bruits; FA pulses 2+ bilaterally, right groin cath site clear Cardiac:  normal S1, S2; RRR; no murmur  Lungs:  clear to auscultation bilaterally, no wheezing, rhonchi or rales  Abd: soft, nontender, no hepatomegaly  Ext: no edema Musculoskeletal:  No deformities, BUE and BLE strength normal and equal Skin: warm and dry  Neuro:  CNs 2-12 intact, no focal abnormalities  noted Psych:  Normal affect    EKG:  The ECG that was done 05/25/2020 was personally reviewed and demonstrates normal sinus rhythm 67 bpm, within normal limits  Relevant CV Studies: Echocardiogram 05/26/2020: IMPRESSIONS    1. Left ventricular ejection fraction, by estimation, is 60 to 65%. The  left ventricle has normal function. The left ventricle has no regional  wall motion abnormalities. Left ventricular diastolic parameters were  normal.  2. Right ventricular systolic function is normal. The right ventricular  size is normal.  3. The mitral valve is normal in structure. Trivial mitral valve  regurgitation.  4. The aortic valve is normal in structure. Aortic valve regurgitation is  not visualized.   Cardiac catheterization 05/26/2020: Conclusion    Ost LAD to Prox LAD lesion is 95% stenosed.  Prox LAD to Mid LAD lesion is 75% stenosed with 75% stenosed side branch in 1st Sept.  Ramus lesion is 90% stenosed.  Ost Cx to Prox Cx lesion is 99% stenosed.  Mid Cx lesion is 100% stenosed.  Prox RCA lesion is 100% stenosed.  Mid RCA lesion is 100% stenosed.   74 year old male with severe hyperlipidemia hypertension with progressive symptoms of shortness of breath anginal symptoms over the last several weeks with some chest pain at rest and a slight elevation of troponin  Cardiogram showing normal LV systolic function with ejection fraction to 60% and no evidence of significant valvular heart disease  Left ventricle angiogram showing normal LV systolic function  Patient has critical distal left main ostial LAD ostial ramus ostial right coronary artery and ostial circumflex artery stenosis   Plan Continue nitrates for chest discomfort Aspirin beta-blocker for hypertension control and cardiovascular disease Discussion for transfer for coronary artery bypass graft Cardiac rehabilitation  Laboratory Data:  High Sensitivity Troponin:   Recent Labs  Lab  05/25/20 1715 05/25/20 1912 05/25/20 2103  TROPONINIHS 38* 35* 31*      Chemistry Recent Labs  Lab 05/25/20 1715 05/26/20 0615  NA 136  --   K 4.1  --   CL 100  --   CO2 27  --  GLUCOSE 90  --   BUN 21  --   CREATININE 0.93 0.87  CALCIUM 8.8*  --   GFRNONAA >60 >60  ANIONGAP 9  --     Recent Labs  Lab 05/25/20 1715  PROT 7.1  ALBUMIN 4.4  AST 34  ALT 29  ALKPHOS 73  BILITOT 0.8   Hematology Recent Labs  Lab 05/25/20 1715 05/26/20 0615  WBC 7.8 5.6  RBC 4.54 4.43  HGB 13.9 13.8  HCT 41.8 40.7  MCV 92.1 91.9  MCH 30.6 31.2  MCHC 33.3 33.9  RDW 12.7 12.9  PLT 229 211   BNPNo results for input(s): BNP, PROBNP in the last 168 hours.  DDimer No results for input(s): DDIMER in the last 168 hours.   Radiology/Studies:  CARDIAC CATHETERIZATION  Result Date: 05/26/2020  Ost LAD to Prox LAD lesion is 95% stenosed.  Prox LAD to Mid LAD lesion is 75% stenosed with 75% stenosed side branch in 1st Sept.  Ramus lesion is 90% stenosed.  Ost Cx to Prox Cx lesion is 99% stenosed.  Mid Cx lesion is 100% stenosed.  Prox RCA lesion is 100% stenosed.  Mid RCA lesion is 100% stenosed.  74 year old male with severe hyperlipidemia hypertension with progressive symptoms of shortness of breath anginal symptoms over the last several weeks with some chest pain at rest and a slight elevation of troponin Cardiogram showing normal LV systolic function with ejection fraction to 60% and no evidence of significant valvular heart disease Left ventricle angiogram showing normal LV systolic function Patient has critical distal left main ostial LAD ostial ramus ostial right coronary artery and ostial circumflex artery stenosis Plan Continue nitrates for chest discomfort Aspirin beta-blocker for hypertension control and cardiovascular disease Discussion for transfer for coronary artery bypass graft Cardiac rehabilitation     Assessment and Plan:   1. CAD with unstable angina 2. Mixed  hyperlipidemia (familial) with well-controlled lipids, LDL 63 mg/dL  Cardiac catheterization films reviewed.  Agree the patient has severe multivessel coronary disease with severe proximal stenosis and left main equivalent disease and a left dominant coronary system.  He has symptoms of unstable angina and it is appropriate to consider him for CABG.  He will be treated with IV heparin.  He will be continued on aspirin, atorvastatin, and a beta-blocker.  LV function is preserved by echo and he has no significant valvular disease.  Formal cardiac surgical consultation will be obtained for consideration of CABG.      TIMI Risk Score for Unstable Angina or Non-ST Elevation MI:   The patient's TIMI risk score is 4, which indicates a 20% risk of all cause mortality, new or recurrent myocardial infarction or need for urgent revascularization in the next 14 days.       For questions or updates, please contact [place] Please consult [email or web address] for contact info under     Signed, Sherren Mocha, MD  05/27/2020 4:20 PM

## 2020-05-28 ENCOUNTER — Inpatient Hospital Stay (HOSPITAL_COMMUNITY): Payer: 59

## 2020-05-28 ENCOUNTER — Ambulatory Visit: Payer: 59 | Admitting: Physician Assistant

## 2020-05-28 ENCOUNTER — Encounter: Payer: Self-pay | Admitting: Internal Medicine

## 2020-05-28 DIAGNOSIS — I2510 Atherosclerotic heart disease of native coronary artery without angina pectoris: Secondary | ICD-10-CM

## 2020-05-28 DIAGNOSIS — I25110 Atherosclerotic heart disease of native coronary artery with unstable angina pectoris: Secondary | ICD-10-CM | POA: Diagnosis not present

## 2020-05-28 LAB — URINALYSIS, ROUTINE W REFLEX MICROSCOPIC
Bilirubin Urine: NEGATIVE
Glucose, UA: NEGATIVE mg/dL
Hgb urine dipstick: NEGATIVE
Ketones, ur: NEGATIVE mg/dL
Leukocytes,Ua: NEGATIVE
Nitrite: NEGATIVE
Protein, ur: NEGATIVE mg/dL
Specific Gravity, Urine: 1.02 (ref 1.005–1.030)
pH: 6 (ref 5.0–8.0)

## 2020-05-28 LAB — BLOOD GAS, ARTERIAL
Acid-Base Excess: 1.2 mmol/L (ref 0.0–2.0)
Bicarbonate: 25.1 mmol/L (ref 20.0–28.0)
Drawn by: 331761
FIO2: 21
O2 Saturation: 94.1 %
Patient temperature: 36.6
pCO2 arterial: 38.1 mmHg (ref 32.0–48.0)
pH, Arterial: 7.432 (ref 7.350–7.450)
pO2, Arterial: 69.2 mmHg — ABNORMAL LOW (ref 83.0–108.0)

## 2020-05-28 LAB — PULMONARY FUNCTION TEST
FEF 25-75 Pre: 1.02 L/sec
FEF2575-%Pred-Pre: 51 %
FEV1-%Pred-Pre: 74 %
FEV1-Pre: 1.97 L
FEV1FVC-%Pred-Pre: 91 %
FEV6-%Pred-Pre: 85 %
FEV6-Pre: 2.91 L
FEV6FVC-%Pred-Pre: 105 %
FVC-%Pred-Pre: 81 %
FVC-Pre: 2.97 L
Pre FEV1/FVC ratio: 67 %
Pre FEV6/FVC Ratio: 98 %

## 2020-05-28 LAB — BASIC METABOLIC PANEL
Anion gap: 9 (ref 5–15)
BUN: 11 mg/dL (ref 8–23)
CO2: 24 mmol/L (ref 22–32)
Calcium: 8.8 mg/dL — ABNORMAL LOW (ref 8.9–10.3)
Chloride: 105 mmol/L (ref 98–111)
Creatinine, Ser: 0.97 mg/dL (ref 0.61–1.24)
GFR, Estimated: >60 mL/min (ref >60)
Glucose, Bld: 91 mg/dL (ref 70–99)
Potassium: 4 mmol/L (ref 3.5–5.1)
Sodium: 138 mmol/L (ref 135–145)

## 2020-05-28 LAB — HEPARIN LEVEL (UNFRACTIONATED)
Heparin Unfractionated: 0.6 IU/mL (ref 0.30–0.70)
Heparin Unfractionated: 0.44 IU/mL (ref 0.30–0.70)

## 2020-05-28 LAB — CBC
HCT: 42.7 % (ref 39.0–52.0)
Hemoglobin: 14 g/dL (ref 13.0–17.0)
MCH: 30.9 pg (ref 26.0–34.0)
MCHC: 32.8 g/dL (ref 30.0–36.0)
MCV: 94.3 fL (ref 80.0–100.0)
Platelets: 221 10*3/uL (ref 150–400)
RBC: 4.53 MIL/uL (ref 4.22–5.81)
RDW: 12.9 % (ref 11.5–15.5)
WBC: 8 10*3/uL (ref 4.0–10.5)
nRBC: 0 % (ref 0.0–0.2)

## 2020-05-28 LAB — APTT: aPTT: 68 seconds — ABNORMAL HIGH (ref 24–36)

## 2020-05-28 LAB — TYPE AND SCREEN
ABO/RH(D): O POS
Antibody Screen: NEGATIVE

## 2020-05-28 LAB — ABO/RH: ABO/RH(D): O POS

## 2020-05-28 LAB — HEMOGLOBIN A1C
Hgb A1c MFr Bld: 5.4 % (ref 4.8–5.6)
Mean Plasma Glucose: 108.28 mg/dL

## 2020-05-28 LAB — SURGICAL PCR SCREEN
MRSA, PCR: NEGATIVE
Staphylococcus aureus: NEGATIVE

## 2020-05-28 LAB — PROTIME-INR
INR: 1.1 (ref 0.8–1.2)
Prothrombin Time: 13.6 seconds (ref 11.4–15.2)

## 2020-05-28 MED ORDER — PHENYLEPHRINE HCL-NACL 20-0.9 MG/250ML-% IV SOLN
30.0000 ug/min | INTRAVENOUS | Status: AC
  Administered 2020-05-29: 20 ug/min via INTRAVENOUS
  Filled 2020-05-28: qty 250

## 2020-05-28 MED ORDER — BISACODYL 5 MG PO TBEC: 5.0000 mg | DELAYED_RELEASE_TABLET | Freq: Once | ORAL | Status: DC

## 2020-05-28 MED ORDER — CHLORHEXIDINE GLUCONATE CLOTH 2 % EX PADS
6.0000 | MEDICATED_PAD | Freq: Once | CUTANEOUS | Status: AC
  Administered 2020-05-29: 6 via TOPICAL

## 2020-05-28 MED ORDER — POTASSIUM CHLORIDE 2 MEQ/ML IV SOLN
80.0000 meq | INTRAVENOUS | Status: DC
  Filled 2020-05-28: qty 40

## 2020-05-28 MED ORDER — INSULIN REGULAR(HUMAN) IN NACL 100-0.9 UT/100ML-% IV SOLN
INTRAVENOUS | Status: AC
  Administered 2020-05-29: .9 [IU]/h via INTRAVENOUS
  Filled 2020-05-28: qty 100

## 2020-05-28 MED ORDER — TRANEXAMIC ACID (OHS) PUMP PRIME SOLUTION
2.0000 mg/kg | INTRAVENOUS | Status: DC
  Filled 2020-05-28: qty 1.87

## 2020-05-28 MED ORDER — ALFUZOSIN HCL ER 10 MG PO TB24
10.0000 mg | ORAL_TABLET | Freq: Every day | ORAL | Status: DC
  Administered 2020-05-28 – 2020-06-06 (×10): 10 mg via ORAL
  Filled 2020-05-28 (×13): qty 1

## 2020-05-28 MED ORDER — TRANEXAMIC ACID 1000 MG/10ML IV SOLN
1.5000 mg/kg/h | INTRAVENOUS | Status: AC
  Administered 2020-05-29: 1.5 mg/kg/h via INTRAVENOUS
  Filled 2020-05-28: qty 25

## 2020-05-28 MED ORDER — METOPROLOL TARTRATE 12.5 MG HALF TABLET
12.5000 mg | ORAL_TABLET | Freq: Once | ORAL | Status: AC
  Administered 2020-05-29: 12.5 mg via ORAL
  Filled 2020-05-28: qty 1

## 2020-05-28 MED ORDER — MAGNESIUM SULFATE 50 % IJ SOLN
40.0000 meq | INTRAMUSCULAR | Status: DC
  Filled 2020-05-28: qty 9.85

## 2020-05-28 MED ORDER — NOREPINEPHRINE 4 MG/250ML-% IV SOLN
0.0000 ug/min | INTRAVENOUS | Status: DC
  Filled 2020-05-28: qty 250

## 2020-05-28 MED ORDER — SODIUM CHLORIDE 0.9 % IV SOLN
INTRAVENOUS | Status: DC
  Filled 2020-05-28: qty 30

## 2020-05-28 MED ORDER — FINASTERIDE 5 MG PO TABS
5.0000 mg | ORAL_TABLET | Freq: Every day | ORAL | Status: DC
  Administered 2020-05-29 – 2020-06-06 (×9): 5 mg via ORAL
  Filled 2020-05-28 (×9): qty 1

## 2020-05-28 MED ORDER — SODIUM CHLORIDE 0.9 % IV SOLN
750.0000 mg | INTRAVENOUS | Status: AC
  Administered 2020-05-29: 750 mg via INTRAVENOUS
  Filled 2020-05-28: qty 750

## 2020-05-28 MED ORDER — ICOSAPENT ETHYL 1 G PO CAPS
2.0000 g | ORAL_CAPSULE | Freq: Two times a day (BID) | ORAL | Status: DC
  Administered 2020-05-28 – 2020-06-07 (×19): 2 g via ORAL
  Filled 2020-05-28 (×23): qty 2

## 2020-05-28 MED ORDER — PLASMA-LYTE 148 IV SOLN
INTRAVENOUS | Status: DC
  Filled 2020-05-28: qty 2.5

## 2020-05-28 MED ORDER — TEMAZEPAM 15 MG PO CAPS
15.0000 mg | ORAL_CAPSULE | Freq: Once | ORAL | Status: AC | PRN
  Administered 2020-05-28: 15 mg via ORAL
  Filled 2020-05-28: qty 1

## 2020-05-28 MED ORDER — MILRINONE LACTATE IN DEXTROSE 20-5 MG/100ML-% IV SOLN
0.3000 ug/kg/min | INTRAVENOUS | Status: DC
  Filled 2020-05-28: qty 100

## 2020-05-28 MED ORDER — CHLORHEXIDINE GLUCONATE CLOTH 2 % EX PADS
6.0000 | MEDICATED_PAD | Freq: Once | CUTANEOUS | Status: AC
  Administered 2020-05-28: 6 via TOPICAL

## 2020-05-28 MED ORDER — CHLORHEXIDINE GLUCONATE 0.12 % MT SOLN
15.0000 mL | Freq: Once | OROMUCOSAL | Status: AC
  Administered 2020-05-29: 15 mL via OROMUCOSAL
  Filled 2020-05-28: qty 15

## 2020-05-28 MED ORDER — NITROGLYCERIN IN D5W 200-5 MCG/ML-% IV SOLN
2.0000 ug/min | INTRAVENOUS | Status: AC
  Administered 2020-05-29: 20 ug/min via INTRAVENOUS
  Filled 2020-05-28: qty 250

## 2020-05-28 MED ORDER — DEXMEDETOMIDINE HCL IN NACL 400 MCG/100ML IV SOLN
0.1000 ug/kg/h | INTRAVENOUS | Status: AC
  Administered 2020-05-29: .3 ug/kg/h via INTRAVENOUS
  Filled 2020-05-28: qty 100

## 2020-05-28 MED ORDER — TRANEXAMIC ACID (OHS) BOLUS VIA INFUSION
15.0000 mg/kg | INTRAVENOUS | Status: AC
  Administered 2020-05-29: 1404 mg via INTRAVENOUS
  Filled 2020-05-28: qty 1404

## 2020-05-28 MED ORDER — EPINEPHRINE HCL 5 MG/250ML IV SOLN IN NS
0.0000 ug/min | INTRAVENOUS | Status: DC
  Filled 2020-05-28: qty 250

## 2020-05-28 MED ORDER — VANCOMYCIN HCL 1500 MG/300ML IV SOLN
1500.0000 mg | INTRAVENOUS | Status: AC
  Administered 2020-05-29: 1500 mg via INTRAVENOUS
  Filled 2020-05-28: qty 300

## 2020-05-28 MED ORDER — SODIUM CHLORIDE 0.9 % IV SOLN
1.5000 g | INTRAVENOUS | Status: AC
  Administered 2020-05-29: 1.5 g via INTRAVENOUS
  Filled 2020-05-28: qty 1.5

## 2020-05-28 MED ORDER — ALFUZOSIN HCL ER 10 MG PO TB24
10.0000 mg | ORAL_TABLET | Freq: Every day | ORAL | Status: DC
  Filled 2020-05-28: qty 1

## 2020-05-28 NOTE — Progress Notes (Signed)
VASCULAR LAB    Pre CABG Dopplers have been performed.  See CV proc for preliminary results.   Arretta Toenjes, RVT 05/28/2020, 11:11 AM

## 2020-05-28 NOTE — Progress Notes (Signed)
CARDIAC REHAB PHASE I   Preop ed completed with pt. Pt given IS, able to demonstrate ~2000. Pt educated on importance of sternal precautions, walks, and IS use postop. Pt given in-the-tube sheet along with cardiac surgery booklet and [facility] care guide. Pt states wife will be with him after discharge. Pt denies further questions or concerns at this time. Will continue to follow throughout his hospital stay.  [phone] Rufina Falco, RN BSN 05/28/2020 9:25 AM

## 2020-05-28 NOTE — Progress Notes (Addendum)
Progress Note  Patient Name: Clayton Powell Date of Encounter: 05/28/2020  [facility] Cardiologist: Corey Skains, MD   Subjective   Feeling well this morning.   Inpatient Medications    Scheduled Meds: . aspirin EC  81 mg Oral Daily  . atorvastatin  80 mg Oral q1800  . finasteride  5 mg Oral Daily  . icosapent Ethyl  1 g Oral QID  . metoprolol succinate  25 mg Oral Daily   Continuous Infusions: . heparin 1,000 Units/hr (05/28/20 0658)   PRN Meds: acetaminophen, nitroGLYCERIN, ondansetron (ZOFRAN) IV   Vital Signs    Vitals:   05/27/20 2031 05/27/20 2324 05/28/20 0349 05/28/20 0713  BP: 138/78 136/87 (!) 109/59 (!) 149/88  Pulse:  62  62  Resp: 16 18 16 17   Temp: 98 F (36.7 C) 98.1 F (36.7 C) 98 F (36.7 C) 98.2 F (36.8 C)  TempSrc: Oral Oral Oral Oral  SpO2: 95% 96% 94% 95%    Intake/Output Summary (Last 24 hours) at 05/28/2020 0824 Last data filed at 05/28/2020 0715 Gross per 24 hour  Intake 120 ml  Output 1350 ml  Net -1230 ml   Last 3 Weights 05/27/2020 05/26/2020 05/25/2020  Weight (lbs) 206 lb 5.6 oz 200 lb 14.4 oz 200 lb  Weight (kg) 93.6 kg 91.128 kg 90.719 kg      Telemetry    SR - Personally Reviewed  ECG    No new tracing this morning  Physical Exam  Pleasant older male GEN: No acute distress.   Neck: No JVD Cardiac: RRR, no murmurs, rubs, or gallops.  Respiratory: Clear to auscultation bilaterally. GI: Soft, nontender, non-distended  MS: No edema; No deformity. Neuro:  Nonfocal  Psych: Normal affect   Labs    High Sensitivity Troponin:   Recent Labs  Lab 05/25/20 1715 05/25/20 1912 05/25/20 2103  TROPONINIHS 38* 35* 31*      Chemistry Recent Labs  Lab 05/25/20 1715 05/26/20 0615 05/28/20 0102  NA 136  --  138  K 4.1  --  4.0  CL 100  --  105  CO2 27  --  24  GLUCOSE 90  --  91  BUN 21  --  11  CREATININE 0.93 0.87 0.97  CALCIUM 8.8*  --  8.8*  PROT 7.1  --   --   ALBUMIN 4.4  --   --   AST 34  --    --   ALT 29  --   --   ALKPHOS 73  --   --   BILITOT 0.8  --   --   GFRNONAA >60 >60 >60  ANIONGAP 9  --  9     Hematology Recent Labs  Lab 05/25/20 1715 05/26/20 0615 05/28/20 0102  WBC 7.8 5.6 8.0  RBC 4.54 4.43 4.53  HGB 13.9 13.8 14.0  HCT 41.8 40.7 42.7  MCV 92.1 91.9 94.3  MCH 30.6 31.2 30.9  MCHC 33.3 33.9 32.8  RDW 12.7 12.9 12.9  PLT 229 211 221    BNPNo results for input(s): BNP, PROBNP in the last 168 hours.   DDimer No results for input(s): DDIMER in the last 168 hours.   Radiology    CARDIAC CATHETERIZATION  Result Date: 05/26/2020  Ost LAD to Prox LAD lesion is 95% stenosed.  Prox LAD to Mid LAD lesion is 75% stenosed with 75% stenosed side branch in 1st Sept.  Ramus lesion is 90% stenosed.  Ost Cx to  Prox Cx lesion is 99% stenosed.  Mid Cx lesion is 100% stenosed.  Prox RCA lesion is 100% stenosed.  Mid RCA lesion is 100% stenosed.  74 year old male with severe hyperlipidemia hypertension with progressive symptoms of shortness of breath anginal symptoms over the last several weeks with some chest pain at rest and a slight elevation of troponin Cardiogram showing normal LV systolic function with ejection fraction to 60% and no evidence of significant valvular heart disease Left ventricle angiogram showing normal LV systolic function Patient has critical distal left main ostial LAD ostial ramus ostial right coronary artery and ostial circumflex artery stenosis Plan Continue nitrates for chest discomfort Aspirin beta-blocker for hypertension control and cardiovascular disease Discussion for transfer for coronary artery bypass graft Cardiac rehabilitation   ECHOCARDIOGRAM COMPLETE  Result Date: 05/26/2020    ECHOCARDIOGRAM REPORT   Patient Name:   [name] Date of Exam: 05/26/2020 Medical Rec #:  [id number]        Height:       66.0 in Accession #:    [id number]       Weight:       200.0 lb Date of Birth:  [birth year]       BSA:          2.000 m Patient  Age:    74 years         BP:           178/85 mmHg Patient Gender: M                HR:           49 bpm. Exam Location:  [facility] Procedure: 2D Echo, Cardiac Doppler and Color Doppler Indications:     Chest pain 786.50  History:         Patient has no prior history of Echocardiogram examinations.                  CAD; Risk Factors:Hypertension and Dyslipidemia.  Sonographer:     Sherrie Sport RDCS (AE) Referring Phys:  [place] Diagnosing Phys: Serafina Royals MD IMPRESSIONS  1. Left ventricular ejection fraction, by estimation, is 60 to 65%. The left ventricle has normal function. The left ventricle has no regional wall motion abnormalities. Left ventricular diastolic parameters were normal.  2. Right ventricular systolic function is normal. The right ventricular size is normal.  3. The mitral valve is normal in structure. Trivial mitral valve regurgitation.  4. The aortic valve is normal in structure. Aortic valve regurgitation is not visualized. FINDINGS  Left Ventricle: Left ventricular ejection fraction, by estimation, is 60 to 65%. The left ventricle has normal function. The left ventricle has no regional wall motion abnormalities. The left ventricular internal cavity size was normal in size. There is  no left ventricular hypertrophy. Left ventricular diastolic parameters were normal. Right Ventricle: The right ventricular size is normal. No increase in right ventricular wall thickness. Right ventricular systolic function is normal. Left Atrium: Left atrial size was normal in size. Right Atrium: Right atrial size was normal in size. Pericardium: There is no evidence of pericardial effusion. Mitral Valve: The mitral valve is normal in structure. Trivial mitral valve regurgitation. Tricuspid Valve: The tricuspid valve is normal in structure. Tricuspid valve regurgitation is trivial. Aortic Valve: The aortic valve is normal in structure. Aortic valve regurgitation is not visualized. Aortic valve mean  gradient measures 2.0 mmHg. Aortic valve peak gradient measures 3.9 mmHg. Aortic valve area, by VTI measures  4.73 cm. Pulmonic Valve: The pulmonic valve was normal in structure. Pulmonic valve regurgitation is not visualized. Aorta: The aortic root and ascending aorta are structurally normal, with no evidence of dilitation. IAS/Shunts: No atrial level shunt detected by color flow Doppler.  LEFT VENTRICLE PLAX 2D LVIDd:         4.36 cm LVIDs:         2.80 cm LV PW:         1.32 cm LV IVS:        1.21 cm LVOT diam:     2.30 cm LV SV:         95 LV SV Index:   48 LVOT Area:     4.15 cm  RIGHT VENTRICLE RV Basal diam:  3.72 cm RV S prime:     12.20 cm/s TAPSE (M-mode): 3.1 cm LEFT ATRIUM             Index       RIGHT ATRIUM           Index LA diam:        3.80 cm 1.90 cm/m  RA Area:     17.70 cm LA Vol (A2C):   64.8 ml 32.40 ml/m RA Volume:   48.70 ml  24.35 ml/m LA Vol (A4C):   58.4 ml 29.20 ml/m LA Biplane Vol: 61.7 ml 30.85 ml/m  AORTIC VALVE                   PULMONIC VALVE AV Area (Vmax):    3.60 cm    PV Vmax:        0.57 m/s AV Area (Vmean):   4.05 cm    PV Peak grad:   1.3 mmHg AV Area (VTI):     4.73 cm    RVOT Peak grad: 2 mmHg AV Vmax:           98.80 cm/s AV Vmean:          64.750 cm/s AV VTI:            0.201 m AV Peak Grad:      3.9 mmHg AV Mean Grad:      2.0 mmHg LVOT Vmax:         85.50 cm/s LVOT Vmean:        63.100 cm/s LVOT VTI:          0.229 m LVOT/AV VTI ratio: 1.14  AORTA Ao Root diam: 3.10 cm MITRAL VALVE                TRICUSPID VALVE MV Area (PHT): 2.39 cm     TR Peak grad:   13.1 mmHg MV Decel Time: 318 msec     TR Vmax:        181.00 cm/s MV E velocity: 84.00 cm/s MV A velocity: 116.00 cm/s  SHUNTS MV E/A ratio:  0.72         Systemic VTI:  0.23 m                             Systemic Diam: 2.30 cm Serafina Royals MD Electronically signed by Serafina Royals MD Signature Date/Time: 05/26/2020/1:42:02 PM    Final     Cardiac Studies   Cath: 05/26/20   Ost LAD to Prox LAD lesion  is 95% stenosed.  Prox LAD to Mid LAD lesion is 75% stenosed with 75% stenosed side branch in 1st Sept.  Ramus lesion is 90% stenosed.  Ost Cx to Prox Cx lesion is 99% stenosed.  Mid Cx lesion is 100% stenosed.  Prox RCA lesion is 100% stenosed.  Mid RCA lesion is 100% stenosed.   74 year old male with severe hyperlipidemia hypertension with progressive symptoms of shortness of breath anginal symptoms over the last several weeks with some chest pain at rest and a slight elevation of troponin  Cardiogram showing normal LV systolic function with ejection fraction to 60% and no evidence of significant valvular heart disease  Left ventricle angiogram showing normal LV systolic function  Patient has critical distal left main ostial LAD ostial ramus ostial right coronary artery and ostial circumflex artery stenosis   Plan Continue nitrates for chest discomfort Aspirin beta-blocker for hypertension control and cardiovascular disease Discussion for transfer for coronary artery bypass graft Cardiac rehabilitation  Diagnostic Dominance: Left   Echo: 05/26/20  IMPRESSIONS    1. Left ventricular ejection fraction, by estimation, is 60 to 65%. The  left ventricle has normal function. The left ventricle has no regional  wall motion abnormalities. Left ventricular diastolic parameters were  normal.  2. Right ventricular systolic function is normal. The right ventricular  size is normal.  3. The mitral valve is normal in structure. Trivial mitral valve  regurgitation.  4. The aortic valve is normal in structure. Aortic valve regurgitation is  not visualized.   FINDINGS  Left Ventricle: Left ventricular ejection fraction, by estimation, is 60  to 65%. The left ventricle has normal function. The left ventricle has no  regional wall motion abnormalities. The left ventricular internal cavity  size was normal in size. There is  no left ventricular hypertrophy. Left  ventricular diastolic parameters  were normal.   Right Ventricle: The right ventricular size is normal. No increase in  right ventricular wall thickness. Right ventricular systolic function is  normal.   Left Atrium: Left atrial size was normal in size.   Right Atrium: Right atrial size was normal in size.   Pericardium: There is no evidence of pericardial effusion.   Mitral Valve: The mitral valve is normal in structure. Trivial mitral  valve regurgitation.   Tricuspid Valve: The tricuspid valve is normal in structure. Tricuspid  valve regurgitation is trivial.   Aortic Valve: The aortic valve is normal in structure. Aortic valve  regurgitation is not visualized. Aortic valve mean gradient measures 2.0  mmHg. Aortic valve peak gradient measures 3.9 mmHg. Aortic valve area, by  VTI measures 4.73 cm.   Pulmonic Valve: The pulmonic valve was normal in structure. Pulmonic valve  regurgitation is not visualized.   Aorta: The aortic root and ascending aorta are structurally normal, with  no evidence of dilitation.   IAS/Shunts: No atrial level shunt detected by color flow Doppler.   Patient Profile     74 y.o. male with BPH, HLD newly diagnosed severe three-vessel coronary artery disease presenting in transfer for CABG evaluation.   Assessment & Plan    1. CAD with unable angina: cath noted above with severe multivessel CAD with proximal LAD stenosis with left equivalent. Transferred for TCTS evaluation. Normal EF with no rWMA. CABG planned tomorrow morning. No chest pain overnight -- remains on IV heparin, asa, statin, Vascepa and BB  2. Mixed HLD: on high dose statin and Vascepa. LDL 63  3. BPH: continue alfuzosin, proscar   For questions or updates, please contact [place] Please consult [email or web address] for contact info under    Signed, Reino Bellis, NP  05/28/2020, 8:24 AM    Patient seen, examined. Available data reviewed. Agree with findings, assessment,  and plan as outlined by Reino Bellis, NP.  The patient is independently interviewed and examined.  He remains clinically stable with no resting chest discomfort.  He has been evaluated by Dr. Servando Snare with plans for cardiac surgery tomorrow.  On my exam this evening, he is alert, oriented, in no distress.  Lungs are clear, heart is regular rate and rhythm, there are no murmurs or gallops.  Abdomen is soft nontender, extremities have no edema.  Medications are outlined above.  No changes are recommended.  He is scheduled for first case in the operating room tomorrow.  Sherren Mocha, M.D. 05/28/2020 4:34 PM

## 2020-05-28 NOTE — Progress Notes (Signed)
[place] for Heparin Indication: chest pain/ACS  Allergies  Allergen Reactions  . Ciprofloxacin Hcl Rash  . Quinolones Rash    Patient Measurements: Total Body Weight: 93.6 kg Height: 66 inches Heparin Dosing Weight: 83 kg  Vital Signs: Temp: 98.1 F (36.7 C) (11/03 2324) Temp Source: Oral (11/03 2324) BP: 136/87 (11/03 2324) Pulse Rate: 62 (11/03 2324)  Labs: Recent Labs    05/25/20 1715 05/25/20 1715 05/25/20 1912 05/25/20 2103 05/26/20 0615 05/28/20 0102  HGB 13.9   < >  --   --  13.8 14.0  HCT 41.8  --   --   --  40.7 42.7  PLT 229  --   --   --  211 221  HEPARINUNFRC  --   --   --   --   --  0.60  CREATININE 0.93  --   --   --  0.87 0.97  TROPONINIHS 38*  --  35* 31*  --   --    < > = values in this interval not displayed.    Estimated Creatinine Clearance: 72.6 mL/min (by C-G formula based on SCr of 0.97 mg/dL).  Assessment: 74 y.o. male with CAD awaiting CABG for heparin  Goal of Therapy:  Heparin level 0.3-0.7 units/ml Monitor platelets by anticoagulation protocol: Yes   Plan:  Continue Heparin at current rate   Phillis Knack, PharmD, BCPS  05/28/2020,3:03 AM

## 2020-05-28 NOTE — Progress Notes (Signed)
[address]       [place] [address]             [phone]                   Procedure(s) (LRB): CORONARY ARTERY BYPASS GRAFTING (CABG) (N/A) TRANSESOPHAGEAL ECHOCARDIOGRAM (TEE) (N/A)  LOS: 1 day   Subjective: Stable day no complaints  Objective: Vital signs in last 24 hours: Patient Vitals for the past 24 hrs:  BP Temp Temp src Pulse Resp SpO2  05/28/20 1509 (!) 141/81 98.7 F (37.1 C) Oral -- 18 94 %  05/28/20 1141 (!) 143/79 97.8 F (36.6 C) Oral -- 18 95 %  05/28/20 0713 (!) 149/88 98.2 F (36.8 C) Oral 62 17 95 %  05/28/20 0349 (!) 109/59 98 F (36.7 C) Oral -- 16 94 %  05/27/20 2324 136/87 98.1 F (36.7 C) Oral 62 18 96 %  05/27/20 2031 138/78 98 F (36.7 C) Oral -- 16 95 %    There were no vitals filed for this visit.  Hemodynamic parameters for last 24 hours:    Intake/Output from previous day: 11/03 0701 - 11/04 0700 In: 120 [I.V.:120] Out: 1350 [Urine:1350] Intake/Output this shift: No intake/output data recorded.  Scheduled Meds: . alfuzosin  10 mg Oral QHS  . aspirin EC  81 mg Oral Daily  . atorvastatin  80 mg Oral q1800  . [START ON 05/29/2020] epinephrine  0-10 mcg/min Intravenous To OR  . [START ON 05/29/2020] finasteride  5 mg Oral QHS  . [START ON 05/29/2020] heparin-papaverine-plasmalyte irrigation   Irrigation To OR  . icosapent Ethyl  2 g Oral BID  . [START ON 05/29/2020] insulin   Intravenous To OR  . [START ON 05/29/2020] magnesium sulfate  40 mEq Other To OR  . metoprolol succinate  25 mg Oral Daily  . [START ON 05/29/2020] phenylephrine  30-200 mcg/min Intravenous To OR  . [START ON 05/29/2020] potassium chloride  80 mEq Other To OR  . [START ON 05/29/2020] tranexamic acid  15 mg/kg Intravenous To OR  . [START ON 05/29/2020] tranexamic acid  2 mg/kg Intracatheter To OR   Continuous Infusions: . [START ON 05/29/2020] cefUROXime (ZINACEF)  IV    . [START ON 05/29/2020] cefUROXime (ZINACEF)  IV    . [START ON  05/29/2020] dexmedetomidine    . [START ON 05/29/2020] heparin 30,000 units/NS 1000 mL solution for CELLSAVER    . heparin 1,000 Units/hr (05/28/20 1133)  . [START ON 05/29/2020] milrinone    . [START ON 05/29/2020] nitroGLYCERIN    . [START ON 05/29/2020] norepinephrine    . [START ON 05/29/2020] tranexamic acid (CYKLOKAPRON) infusion ([facility])    . [START ON 05/29/2020] vancomycin     PRN Meds:.acetaminophen, nitroGLYCERIN, ondansetron (ZOFRAN) IV    Lab Results: CBC: Recent Labs    05/26/20 0615 05/28/20 0102  WBC 5.6 8.0  HGB 13.8 14.0  HCT 40.7 42.7  PLT 211 221   BMET:  Recent Labs    05/26/20 0615 05/28/20 0102  NA  --  138  K  --  4.0  CL  --  105  CO2  --  24  GLUCOSE  --  91  BUN  --  11  CREATININE 0.87 0.97  CALCIUM  --  8.8*    PT/INR:  Recent Labs    05/28/20 1218  LABPROT 13.6  INR 1.1     Radiology VAS US DOPPLER PRE CABG  Result Date: 05/28/2020 PREOPERATIVE VASCULAR EVALUATION  Indications:      Pre-CABG. Risk Factors:     Hypertension, hyperlipidemia, coronary artery disease. Comparison Study: No prior study on file Performing Technologist: Sharion Dove RVS  Examination Guidelines: A complete evaluation includes B-mode imaging, spectral Doppler, color Doppler, and power Doppler as needed of all accessible portions of each vessel. Bilateral testing is considered an integral part of a complete examination. Limited examinations for reoccurring indications may be performed as noted.  Right Carotid Findings: +----------+--------+--------+--------+--------+------------------+           PSV cm/sEDV cm/sStenosisDescribeComments           +----------+--------+--------+--------+--------+------------------+ CCA Prox  117     15                      intimal thickening +----------+--------+--------+--------+--------+------------------+ CCA Distal86      25                      intimal thickening  +----------+--------+--------+--------+--------+------------------+ ICA Prox  72      23              calcific                   +----------+--------+--------+--------+--------+------------------+ ICA Distal72      22                                         +----------+--------+--------+--------+--------+------------------+ ECA       109     11                                         +----------+--------+--------+--------+--------+------------------+ Portions of this table do not appear on this page. +----------+--------+-------+--------+------------+           PSV cm/sEDV cmsDescribeArm Pressure +----------+--------+-------+--------+------------+ Subclavian71                                  +----------+--------+-------+--------+------------+ +---------+--------+--+--------+--+ VertebralPSV cm/s44EDV cm/s11 +---------+--------+--+--------+--+ Left Carotid Findings: +----------+--------+--------+--------+------------+------------------+           PSV cm/sEDV cm/sStenosisDescribe    Comments           +----------+--------+--------+--------+------------+------------------+ CCA Prox  89      18                          intimal thickening +----------+--------+--------+--------+------------+------------------+ CCA Distal99      22                          intimal thickening +----------+--------+--------+--------+------------+------------------+ ICA Prox  76      27              heterogenous                   +----------+--------+--------+--------+------------+------------------+ ICA Distal86      27                                             +----------+--------+--------+--------+------------+------------------+ ECA       51  9                                              +----------+--------+--------+--------+------------+------------------+ +----------+--------+--------+--------+------------+ SubclavianPSV cm/sEDV cm/sDescribeArm  Pressure +----------+--------+--------+--------+------------+           102                                  +----------+--------+--------+--------+------------+ +---------+--------+--+--------+--+ VertebralPSV cm/s45EDV cm/s12 +---------+--------+--+--------+--+  ABI Findings: +--------+------------------+-----+-----------+--------+ Right   Rt Pressure (mmHg)IndexWaveform   Comment  +--------+------------------+-----+-----------+--------+ [id number]                    triphasic           +--------+------------------+-----+-----------+--------+ PTA                            multiphasic         +--------+------------------+-----+-----------+--------+ DP                             multiphasic         +--------+------------------+-----+-----------+--------+ +--------+------------------+-----+-----------+-------+ Left    Lt Pressure (mmHg)IndexWaveform   Comment +--------+------------------+-----+-----------+-------+ [id number]                    triphasic          +--------+------------------+-----+-----------+-------+ PTA                            multiphasic        +--------+------------------+-----+-----------+-------+ DP                             multiphasic        +--------+------------------+-----+-----------+-------+  Right Doppler Findings: +--------+--------+-----+-----------+--------+ Site    PressureIndexDoppler    Comments +--------+--------+-----+-----------+--------+ [id number]          triphasic           +--------+--------+-----+-----------+--------+ Radial               multiphasic         +--------+--------+-----+-----------+--------+ Ulnar                multiphasic         +--------+--------+-----+-----------+--------+  Left Doppler Findings: +--------+--------+-----+-----------+--------+ Site    PressureIndexDoppler    Comments +--------+--------+-----+-----------+--------+ [id number]           triphasic           +--------+--------+-----+-----------+--------+ Radial               multiphasic         +--------+--------+-----+-----------+--------+ Ulnar                multiphasic         +--------+--------+-----+-----------+--------+  Summary: Right Carotid: The extracranial vessels were near-normal with only minimal wall                thickening or plaque. Left Carotid: The extracranial vessels were near-normal with only minimal wall               thickening or plaque. Right Upper Extremity: Doppler waveforms remain within normal limits with right radial compression. Doppler waveforms remain within normal  limits with right ulnar compression. Left Upper Extremity: Doppler waveforms remain within normal limits with left radial compression. Doppler waveforms remain within normal limits with left ulnar compression.     Preliminary      Assessment/Plan: S/P Procedure(s) (LRB): CORONARY ARTERY BYPASS GRAFTING (CABG) (N/A) TRANSESOPHAGEAL ECHOCARDIOGRAM (TEE) (N/A) Plan to proceed with CABG in am Patient agreeable and questions answered Dopplers of carotid ok   Grace Isaac MD 05/28/2020 5:27 PM

## 2020-05-28 NOTE — Progress Notes (Signed)
      [address]       [place] [address]             [phone]      Today's Vitals   05/27/20 2031 05/27/20 2324 05/28/20 0349 05/28/20 0713  BP: 138/78 136/87 (!) 109/59 (!) 149/88  Pulse:  62  62  Resp: 16 18 16 17   Temp: 98 F (36.7 C) 98.1 F (36.7 C) 98 F (36.7 C) 98.2 F (36.8 C)  TempSrc: Oral Oral Oral Oral  SpO2: 95% 96% 94% 95%  PainSc:    0-No pain   There is no height or weight on file to calculate BMI.    Very stable without CP or SOB For [place] E Brace Welte, PA-C

## 2020-05-28 NOTE — Progress Notes (Signed)
[place] for Heparin Indication: chest pain/ACS  Allergies  Allergen Reactions  . Ciprofloxacin Hcl Rash  . Quinolones Rash    Patient Measurements: Total Body Weight: 93.6 kg Height: 66 inches Heparin Dosing Weight: 83 kg  Vital Signs: Temp: 98.2 F (36.8 C) (11/04 0713) Temp Source: Oral (11/04 0713) BP: 149/88 (11/04 0713) Pulse Rate: 62 (11/04 0713)  Labs: Recent Labs    05/25/20 1715 05/25/20 1715 05/25/20 1912 05/25/20 2103 05/26/20 0615 05/28/20 0102  HGB 13.9   < >  --   --  13.8 14.0  HCT 41.8  --   --   --  40.7 42.7  PLT 229  --   --   --  211 221  HEPARINUNFRC  --   --   --   --   --  0.60  CREATININE 0.93  --   --   --  0.87 0.97  TROPONINIHS 38*  --  35* 31*  --   --    < > = values in this interval not displayed.    Estimated Creatinine Clearance: 72.6 mL/min (by C-G formula based on SCr of 0.97 mg/dL).  Assessment: 74 y.o. male with CAD awaiting CABG in am.  Confirmatory heparin level at goal. No bleeding issues noted.   Goal of Therapy:  Heparin level 0.3-0.7 units/ml Monitor platelets by anticoagulation protocol: Yes   Plan:  Continue Heparin at current rate   Erin Hearing PharmD., BCPS Clinical Pharmacist 05/28/2020 10:47 AM

## 2020-05-29 ENCOUNTER — Inpatient Hospital Stay (HOSPITAL_COMMUNITY): Payer: 59 | Admitting: Certified Registered Nurse Anesthetist

## 2020-05-29 ENCOUNTER — Inpatient Hospital Stay (HOSPITAL_COMMUNITY)
Admission: AD | Disposition: A | Discharge status: Home / Self Care | Payer: Self-pay | Source: Other Acute Inpatient Hospital | Attending: Cardiothoracic Surgery

## 2020-05-29 ENCOUNTER — Inpatient Hospital Stay (HOSPITAL_COMMUNITY): Payer: 59

## 2020-05-29 ENCOUNTER — Encounter (HOSPITAL_COMMUNITY): Payer: Self-pay | Admitting: Cardiovascular Disease

## 2020-05-29 DIAGNOSIS — I25110 Atherosclerotic heart disease of native coronary artery with unstable angina pectoris: Secondary | ICD-10-CM

## 2020-05-29 DIAGNOSIS — Z951 Presence of aortocoronary bypass graft: Secondary | ICD-10-CM

## 2020-05-29 HISTORY — PX: TEE WITHOUT CARDIOVERSION: SHX5443

## 2020-05-29 HISTORY — PX: ENDOVEIN HARVEST OF GREATER SAPHENOUS VEIN: SHX5059

## 2020-05-29 HISTORY — PX: CORONARY ARTERY BYPASS GRAFT: SHX141

## 2020-05-29 LAB — CBC
HCT: 40.9 % (ref 39.0–52.0)
HCT: 38.6 % — ABNORMAL LOW (ref 39.0–52.0)
HCT: 35.5 % — ABNORMAL LOW (ref 39.0–52.0)
Hemoglobin: 13.7 g/dL (ref 13.0–17.0)
Hemoglobin: 12.9 g/dL — ABNORMAL LOW (ref 13.0–17.0)
Hemoglobin: 11.6 g/dL — ABNORMAL LOW (ref 13.0–17.0)
MCH: 31.6 pg (ref 26.0–34.0)
MCH: 31.9 pg (ref 26.0–34.0)
MCH: 30.9 pg (ref 26.0–34.0)
MCHC: 33.5 g/dL (ref 30.0–36.0)
MCHC: 33.4 g/dL (ref 30.0–36.0)
MCHC: 32.7 g/dL (ref 30.0–36.0)
MCV: 94.2 fL (ref 80.0–100.0)
MCV: 95.5 fL (ref 80.0–100.0)
MCV: 94.7 fL (ref 80.0–100.0)
Platelets: 200 10*3/uL (ref 150–400)
Platelets: 124 10*3/uL — ABNORMAL LOW (ref 150–400)
Platelets: 127 10*3/uL — ABNORMAL LOW (ref 150–400)
RBC: 4.34 MIL/uL (ref 4.22–5.81)
RBC: 4.04 MIL/uL — ABNORMAL LOW (ref 4.22–5.81)
RBC: 3.75 MIL/uL — ABNORMAL LOW (ref 4.22–5.81)
RDW: 12.4 % (ref 11.5–15.5)
RDW: 12.6 % (ref 11.5–15.5)
RDW: 12.6 % (ref 11.5–15.5)
WBC: 7.9 10*3/uL (ref 4.0–10.5)
WBC: 13.3 10*3/uL — ABNORMAL HIGH (ref 4.0–10.5)
WBC: 11.5 10*3/uL — ABNORMAL HIGH (ref 4.0–10.5)
nRBC: 0 % (ref 0.0–0.2)
nRBC: 0 % (ref 0.0–0.2)
nRBC: 0 % (ref 0.0–0.2)

## 2020-05-29 LAB — POCT I-STAT, CHEM 8
BUN: 22 mg/dL (ref 8–23)
BUN: 21 mg/dL (ref 8–23)
BUN: 19 mg/dL (ref 8–23)
BUN: 19 mg/dL (ref 8–23)
BUN: 20 mg/dL (ref 8–23)
Calcium, Ion: 1.26 mmol/L (ref 1.15–1.40)
Calcium, Ion: 1.24 mmol/L (ref 1.15–1.40)
Calcium, Ion: 1.09 mmol/L — ABNORMAL LOW (ref 1.15–1.40)
Calcium, Ion: 1.14 mmol/L — ABNORMAL LOW (ref 1.15–1.40)
Calcium, Ion: 1.14 mmol/L — ABNORMAL LOW (ref 1.15–1.40)
Chloride: 102 mmol/L (ref 98–111)
Chloride: 103 mmol/L (ref 98–111)
Chloride: 100 mmol/L (ref 98–111)
Chloride: 102 mmol/L (ref 98–111)
Chloride: 103 mmol/L (ref 98–111)
Creatinine, Ser: 1 mg/dL (ref 0.61–1.24)
Creatinine, Ser: 0.9 mg/dL (ref 0.61–1.24)
Creatinine, Ser: 0.9 mg/dL (ref 0.61–1.24)
Creatinine, Ser: 0.9 mg/dL (ref 0.61–1.24)
Creatinine, Ser: 0.9 mg/dL (ref 0.61–1.24)
Glucose, Bld: 110 mg/dL — ABNORMAL HIGH (ref 70–99)
Glucose, Bld: 118 mg/dL — ABNORMAL HIGH (ref 70–99)
Glucose, Bld: 124 mg/dL — ABNORMAL HIGH (ref 70–99)
Glucose, Bld: 146 mg/dL — ABNORMAL HIGH (ref 70–99)
Glucose, Bld: 151 mg/dL — ABNORMAL HIGH (ref 70–99)
HCT: 39 % (ref 39.0–52.0)
HCT: 36 % — ABNORMAL LOW (ref 39.0–52.0)
HCT: 29 % — ABNORMAL LOW (ref 39.0–52.0)
HCT: 32 % — ABNORMAL LOW (ref 39.0–52.0)
HCT: 33 % — ABNORMAL LOW (ref 39.0–52.0)
Hemoglobin: 13.3 g/dL (ref 13.0–17.0)
Hemoglobin: 12.2 g/dL — ABNORMAL LOW (ref 13.0–17.0)
Hemoglobin: 9.9 g/dL — ABNORMAL LOW (ref 13.0–17.0)
Hemoglobin: 10.9 g/dL — ABNORMAL LOW (ref 13.0–17.0)
Hemoglobin: 11.2 g/dL — ABNORMAL LOW (ref 13.0–17.0)
Potassium: 4 mmol/L (ref 3.5–5.1)
Potassium: 4 mmol/L (ref 3.5–5.1)
Potassium: 4.4 mmol/L (ref 3.5–5.1)
Potassium: 4.3 mmol/L (ref 3.5–5.1)
Potassium: 4.8 mmol/L (ref 3.5–5.1)
Sodium: 140 mmol/L (ref 135–145)
Sodium: 138 mmol/L (ref 135–145)
Sodium: 137 mmol/L (ref 135–145)
Sodium: 138 mmol/L (ref 135–145)
Sodium: 138 mmol/L (ref 135–145)
TCO2: 25 mmol/L (ref 22–32)
TCO2: 24 mmol/L (ref 22–32)
TCO2: 24 mmol/L (ref 22–32)
TCO2: 21 mmol/L — ABNORMAL LOW (ref 22–32)
TCO2: 23 mmol/L (ref 22–32)

## 2020-05-29 LAB — POCT I-STAT 7, (LYTES, BLD GAS, ICA,H+H)
Acid-Base Excess: 0 mmol/L (ref 0.0–2.0)
Acid-Base Excess: 1 mmol/L (ref 0.0–2.0)
Acid-base deficit: 1 mmol/L (ref 0.0–2.0)
Acid-base deficit: 3 mmol/L — ABNORMAL HIGH (ref 0.0–2.0)
Acid-base deficit: 3 mmol/L — ABNORMAL HIGH (ref 0.0–2.0)
Acid-base deficit: 3 mmol/L — ABNORMAL HIGH (ref 0.0–2.0)
Acid-base deficit: 5 mmol/L — ABNORMAL HIGH (ref 0.0–2.0)
Bicarbonate: 25.9 mmol/L (ref 20.0–28.0)
Bicarbonate: 25.6 mmol/L (ref 20.0–28.0)
Bicarbonate: 23.7 mmol/L (ref 20.0–28.0)
Bicarbonate: 22 mmol/L (ref 20.0–28.0)
Bicarbonate: 24.2 mmol/L (ref 20.0–28.0)
Bicarbonate: 22.2 mmol/L (ref 20.0–28.0)
Bicarbonate: 19.9 mmol/L — ABNORMAL LOW (ref 20.0–28.0)
Calcium, Ion: 1.22 mmol/L (ref 1.15–1.40)
Calcium, Ion: 0.94 mmol/L — ABNORMAL LOW (ref 1.15–1.40)
Calcium, Ion: 1.11 mmol/L — ABNORMAL LOW (ref 1.15–1.40)
Calcium, Ion: 1.14 mmol/L — ABNORMAL LOW (ref 1.15–1.40)
Calcium, Ion: 1.42 mmol/L — ABNORMAL HIGH (ref 1.15–1.40)
Calcium, Ion: 1.2 mmol/L (ref 1.15–1.40)
Calcium, Ion: 1.19 mmol/L (ref 1.15–1.40)
HCT: 39 % (ref 39.0–52.0)
HCT: 30 % — ABNORMAL LOW (ref 39.0–52.0)
HCT: 32 % — ABNORMAL LOW (ref 39.0–52.0)
HCT: 33 % — ABNORMAL LOW (ref 39.0–52.0)
HCT: 38 % — ABNORMAL LOW (ref 39.0–52.0)
HCT: 35 % — ABNORMAL LOW (ref 39.0–52.0)
HCT: 33 % — ABNORMAL LOW (ref 39.0–52.0)
Hemoglobin: 13.3 g/dL (ref 13.0–17.0)
Hemoglobin: 10.2 g/dL — ABNORMAL LOW (ref 13.0–17.0)
Hemoglobin: 10.9 g/dL — ABNORMAL LOW (ref 13.0–17.0)
Hemoglobin: 11.2 g/dL — ABNORMAL LOW (ref 13.0–17.0)
Hemoglobin: 12.9 g/dL — ABNORMAL LOW (ref 13.0–17.0)
Hemoglobin: 11.9 g/dL — ABNORMAL LOW (ref 13.0–17.0)
Hemoglobin: 11.2 g/dL — ABNORMAL LOW (ref 13.0–17.0)
O2 Saturation: 100 %
O2 Saturation: 100 %
O2 Saturation: 100 %
O2 Saturation: 94 %
O2 Saturation: 98 %
O2 Saturation: 96 %
O2 Saturation: 97 %
Patient temperature: 36.6
Patient temperature: 36.7
Patient temperature: 37.1
Potassium: 4 mmol/L (ref 3.5–5.1)
Potassium: 4.2 mmol/L (ref 3.5–5.1)
Potassium: 4.6 mmol/L (ref 3.5–5.1)
Potassium: 4.3 mmol/L (ref 3.5–5.1)
Potassium: 4.2 mmol/L (ref 3.5–5.1)
Potassium: 4.8 mmol/L (ref 3.5–5.1)
Potassium: 4.5 mmol/L (ref 3.5–5.1)
Sodium: 139 mmol/L (ref 135–145)
Sodium: 139 mmol/L (ref 135–145)
Sodium: 137 mmol/L (ref 135–145)
Sodium: 139 mmol/L (ref 135–145)
Sodium: 140 mmol/L (ref 135–145)
Sodium: 140 mmol/L (ref 135–145)
Sodium: 140 mmol/L (ref 135–145)
TCO2: 27 mmol/L (ref 22–32)
TCO2: 27 mmol/L (ref 22–32)
TCO2: 25 mmol/L (ref 22–32)
TCO2: 23 mmol/L (ref 22–32)
TCO2: 26 mmol/L (ref 22–32)
TCO2: 23 mmol/L (ref 22–32)
TCO2: 21 mmol/L — ABNORMAL LOW (ref 22–32)
pCO2 arterial: 46.3 mmHg (ref 32.0–48.0)
pCO2 arterial: 39.3 mmHg (ref 32.0–48.0)
pCO2 arterial: 36.6 mmHg (ref 32.0–48.0)
pCO2 arterial: 40.7 mmHg (ref 32.0–48.0)
pCO2 arterial: 48.4 mmHg — ABNORMAL HIGH (ref 32.0–48.0)
pCO2 arterial: 38.8 mmHg (ref 32.0–48.0)
pCO2 arterial: 35.6 mmHg (ref 32.0–48.0)
pH, Arterial: 7.356 (ref 7.350–7.450)
pH, Arterial: 7.421 (ref 7.350–7.450)
pH, Arterial: 7.42 (ref 7.350–7.450)
pH, Arterial: 7.342 — ABNORMAL LOW (ref 7.350–7.450)
pH, Arterial: 7.305 — ABNORMAL LOW (ref 7.350–7.450)
pH, Arterial: 7.363 (ref 7.350–7.450)
pH, Arterial: 7.356 (ref 7.350–7.450)
pO2, Arterial: 414 mmHg — ABNORMAL HIGH (ref 83.0–108.0)
pO2, Arterial: 403 mmHg — ABNORMAL HIGH (ref 83.0–108.0)
pO2, Arterial: 319 mmHg — ABNORMAL HIGH (ref 83.0–108.0)
pO2, Arterial: 77 mmHg — ABNORMAL LOW (ref 83.0–108.0)
pO2, Arterial: 106 mmHg (ref 83.0–108.0)
pO2, Arterial: 85 mmHg (ref 83.0–108.0)
pO2, Arterial: 93 mmHg (ref 83.0–108.0)

## 2020-05-29 LAB — BASIC METABOLIC PANEL
Anion gap: 10 (ref 5–15)
Anion gap: 9 (ref 5–15)
BUN: 22 mg/dL (ref 8–23)
BUN: 16 mg/dL (ref 8–23)
CO2: 21 mmol/L — ABNORMAL LOW (ref 22–32)
CO2: 19 mmol/L — ABNORMAL LOW (ref 22–32)
Calcium: 8.6 mg/dL — ABNORMAL LOW (ref 8.9–10.3)
Calcium: 8 mg/dL — ABNORMAL LOW (ref 8.9–10.3)
Chloride: 106 mmol/L (ref 98–111)
Chloride: 107 mmol/L (ref 98–111)
Creatinine, Ser: 1.02 mg/dL (ref 0.61–1.24)
Creatinine, Ser: 0.96 mg/dL (ref 0.61–1.24)
GFR, Estimated: >60 mL/min (ref >60)
GFR, Estimated: >60 mL/min (ref >60)
Glucose, Bld: 99 mg/dL (ref 70–99)
Glucose, Bld: 132 mg/dL — ABNORMAL HIGH (ref 70–99)
Potassium: 4.2 mmol/L (ref 3.5–5.1)
Potassium: 4.4 mmol/L (ref 3.5–5.1)
Sodium: 137 mmol/L (ref 135–145)
Sodium: 135 mmol/L (ref 135–145)

## 2020-05-29 LAB — GLUCOSE, CAPILLARY
Glucose-Capillary: 115 mg/dL — ABNORMAL HIGH (ref 70–99)
Glucose-Capillary: 126 mg/dL — ABNORMAL HIGH (ref 70–99)
Glucose-Capillary: 124 mg/dL — ABNORMAL HIGH (ref 70–99)
Glucose-Capillary: 125 mg/dL — ABNORMAL HIGH (ref 70–99)
Glucose-Capillary: 134 mg/dL — ABNORMAL HIGH (ref 70–99)
Glucose-Capillary: 148 mg/dL — ABNORMAL HIGH (ref 70–99)
Glucose-Capillary: 137 mg/dL — ABNORMAL HIGH (ref 70–99)
Glucose-Capillary: 119 mg/dL — ABNORMAL HIGH (ref 70–99)

## 2020-05-29 LAB — HEMOGLOBIN AND HEMATOCRIT, BLOOD
HCT: 31.4 % — ABNORMAL LOW (ref 39.0–52.0)
Hemoglobin: 10.6 g/dL — ABNORMAL LOW (ref 13.0–17.0)

## 2020-05-29 LAB — ECHO INTRAOPERATIVE TEE
AV Mean grad: 4 mmHg
Height: 66 in
Weight: 3153.6 oz

## 2020-05-29 LAB — POCT I-STAT EG7
Acid-base deficit: 1 mmol/L (ref 0.0–2.0)
Bicarbonate: 23.8 mmol/L (ref 20.0–28.0)
Calcium, Ion: 1.02 mmol/L — ABNORMAL LOW (ref 1.15–1.40)
HCT: 30 % — ABNORMAL LOW (ref 39.0–52.0)
Hemoglobin: 10.2 g/dL — ABNORMAL LOW (ref 13.0–17.0)
O2 Saturation: 77 %
Potassium: 4 mmol/L (ref 3.5–5.1)
Sodium: 136 mmol/L (ref 135–145)
TCO2: 25 mmol/L (ref 22–32)
pCO2, Ven: 39.8 mmHg — ABNORMAL LOW (ref 44.0–60.0)
pH, Ven: 7.385 (ref 7.250–7.430)
pO2, Ven: 42 mmHg (ref 32.0–45.0)

## 2020-05-29 LAB — APTT: aPTT: 30 seconds (ref 24–36)

## 2020-05-29 LAB — PLATELET COUNT: Platelets: 161 10*3/uL (ref 150–400)

## 2020-05-29 LAB — MAGNESIUM: Magnesium: 2.4 mg/dL (ref 1.7–2.4)

## 2020-05-29 LAB — PROTIME-INR
INR: 1.4 — ABNORMAL HIGH (ref 0.8–1.2)
Prothrombin Time: 16.5 seconds — ABNORMAL HIGH (ref 11.4–15.2)

## 2020-05-29 LAB — HEPARIN LEVEL (UNFRACTIONATED): Heparin Unfractionated: 0.3 IU/mL (ref 0.30–0.70)

## 2020-05-29 SURGERY — CORONARY ARTERY BYPASS GRAFTING (CABG)
Anesthesia: General | Site: Leg Upper | Laterality: Right

## 2020-05-29 MED ORDER — FAMOTIDINE IN NACL 20-0.9 MG/50ML-% IV SOLN
20.0000 mg | Freq: Two times a day (BID) | INTRAVENOUS | Status: AC
  Administered 2020-05-29 (×2): 20 mg via INTRAVENOUS
  Filled 2020-05-29 (×2): qty 50

## 2020-05-29 MED ORDER — METOPROLOL TARTRATE 25 MG/10 ML ORAL SUSPENSION: 12.5000 mg | Freq: Two times a day (BID) | ORAL | Status: DC

## 2020-05-29 MED ORDER — CHLORHEXIDINE GLUCONATE 0.12% ORAL RINSE (MEDLINE KIT): 15.0000 mL | Freq: Two times a day (BID) | OROMUCOSAL | Status: DC

## 2020-05-29 MED ORDER — VANCOMYCIN HCL IN DEXTROSE 1-5 GM/200ML-% IV SOLN
1000.0000 mg | Freq: Once | INTRAVENOUS | Status: AC
  Administered 2020-05-29: 1000 mg via INTRAVENOUS
  Filled 2020-05-29: qty 200

## 2020-05-29 MED ORDER — ARTIFICIAL TEARS OPHTHALMIC OINT
TOPICAL_OINTMENT | OPHTHALMIC | Status: AC
  Filled 2020-05-29: qty 3.5

## 2020-05-29 MED ORDER — SODIUM CHLORIDE 0.9 % IV SOLN: INTRAVENOUS | Status: DC | PRN

## 2020-05-29 MED ORDER — MIDAZOLAM HCL (PF) 5 MG/ML IJ SOLN
INTRAMUSCULAR | Status: DC | PRN
  Administered 2020-05-29 (×2): 2 mg via INTRAVENOUS

## 2020-05-29 MED ORDER — PHENYLEPHRINE HCL-NACL 10-0.9 MG/250ML-% IV SOLN
INTRAVENOUS | Status: DC | PRN
  Administered 2020-05-29: 20 ug/min via INTRAVENOUS

## 2020-05-29 MED ORDER — ACETAMINOPHEN 500 MG PO TABS
1000.0000 mg | ORAL_TABLET | Freq: Four times a day (QID) | ORAL | Status: DC
  Administered 2020-05-30 – 2020-05-31 (×6): 1000 mg via ORAL
  Filled 2020-05-29 (×6): qty 2

## 2020-05-29 MED ORDER — EPHEDRINE SULFATE-NACL 50-0.9 MG/10ML-% IV SOSY
PREFILLED_SYRINGE | INTRAVENOUS | Status: DC | PRN
  Administered 2020-05-29: 5 mg via INTRAVENOUS
  Administered 2020-05-29: 10 mg via INTRAVENOUS
  Administered 2020-05-29: 5 mg via INTRAVENOUS
  Administered 2020-05-29: 10 mg via INTRAVENOUS
  Administered 2020-05-29: 5 mg via INTRAVENOUS

## 2020-05-29 MED ORDER — DOCUSATE SODIUM 100 MG PO CAPS
200.0000 mg | ORAL_CAPSULE | Freq: Every day | ORAL | Status: DC
  Administered 2020-05-30: 200 mg via ORAL
  Filled 2020-05-29: qty 2

## 2020-05-29 MED ORDER — DEXMEDETOMIDINE HCL IN NACL 400 MCG/100ML IV SOLN
0.0000 ug/kg/h | INTRAVENOUS | Status: DC
  Administered 2020-05-29: 0.6 ug/kg/h via INTRAVENOUS
  Filled 2020-05-29: qty 100

## 2020-05-29 MED ORDER — SODIUM CHLORIDE 0.9 % IV SOLN: INTRAVENOUS | Status: DC

## 2020-05-29 MED ORDER — BISACODYL 10 MG RE SUPP: 10.0000 mg | Freq: Every day | RECTAL | Status: DC

## 2020-05-29 MED ORDER — ACETAMINOPHEN 650 MG RE SUPP: 650.0000 mg | Freq: Once | RECTAL | Status: AC

## 2020-05-29 MED ORDER — ORAL CARE MOUTH RINSE
15.0000 mL | OROMUCOSAL | Status: DC
  Administered 2020-05-29: 15 mL via OROMUCOSAL

## 2020-05-29 MED ORDER — LACTATED RINGERS IV SOLN: INTRAVENOUS | Status: DC | PRN

## 2020-05-29 MED ORDER — FENTANYL CITRATE (PF) 250 MCG/5ML IJ SOLN
INTRAMUSCULAR | Status: AC
  Filled 2020-05-29: qty 25

## 2020-05-29 MED ORDER — CHLORHEXIDINE GLUCONATE 0.12 % MT SOLN
15.0000 mL | Freq: Two times a day (BID) | OROMUCOSAL | Status: DC
  Administered 2020-05-29 – 2020-06-07 (×17): 15 mL via OROMUCOSAL
  Filled 2020-05-29 (×17): qty 15

## 2020-05-29 MED ORDER — INSULIN REGULAR(HUMAN) IN NACL 100-0.9 UT/100ML-% IV SOLN: INTRAVENOUS | Status: DC

## 2020-05-29 MED ORDER — OXYCODONE HCL 5 MG PO TABS
5.0000 mg | ORAL_TABLET | ORAL | Status: DC | PRN
  Administered 2020-05-30 (×3): 10 mg via ORAL
  Filled 2020-05-29 (×3): qty 2

## 2020-05-29 MED ORDER — SODIUM CHLORIDE 0.9% FLUSH: 3.0000 mL | INTRAVENOUS | Status: DC | PRN

## 2020-05-29 MED ORDER — VASOPRESSIN 20 UNIT/ML IV SOLN
INTRAVENOUS | Status: AC
  Filled 2020-05-29: qty 1

## 2020-05-29 MED ORDER — ROCURONIUM BROMIDE 10 MG/ML (PF) SYRINGE
PREFILLED_SYRINGE | INTRAVENOUS | Status: DC | PRN
  Administered 2020-05-29 (×2): 100 mg via INTRAVENOUS
  Administered 2020-05-29: 50 mg via INTRAVENOUS
  Administered 2020-05-29: 30 mg via INTRAVENOUS

## 2020-05-29 MED ORDER — MIDAZOLAM HCL 2 MG/2ML IJ SOLN: 2.0000 mg | INTRAMUSCULAR | Status: DC | PRN

## 2020-05-29 MED ORDER — NITROGLYCERIN IN D5W 200-5 MCG/ML-% IV SOLN
INTRAVENOUS | Status: DC | PRN
  Administered 2020-05-29: 40 mg via INTRAVENOUS
  Administered 2020-05-29: 20 mg via INTRAVENOUS
  Administered 2020-05-29: 40 mg via INTRAVENOUS

## 2020-05-29 MED ORDER — ACETAMINOPHEN 160 MG/5ML PO SOLN
650.0000 mg | Freq: Once | ORAL | Status: AC
  Administered 2020-05-29: 650 mg

## 2020-05-29 MED ORDER — PHENYLEPHRINE 40 MCG/ML (10ML) SYRINGE FOR IV PUSH (FOR BLOOD PRESSURE SUPPORT)
PREFILLED_SYRINGE | INTRAVENOUS | Status: DC | PRN
  Administered 2020-05-29: 10 ug via INTRAVENOUS

## 2020-05-29 MED ORDER — SODIUM CHLORIDE 0.45 % IV SOLN: INTRAVENOUS | Status: DC | PRN

## 2020-05-29 MED ORDER — PROPOFOL 10 MG/ML IV BOLUS
INTRAVENOUS | Status: DC | PRN
  Administered 2020-05-29 (×2): 50 mg via INTRAVENOUS
  Administered 2020-05-29: 100 mg via INTRAVENOUS

## 2020-05-29 MED ORDER — SODIUM CHLORIDE 0.9% FLUSH
10.0000 mL | Freq: Two times a day (BID) | INTRAVENOUS | Status: DC
  Administered 2020-05-29: 10 mL
  Administered 2020-05-30: 20 mL
  Administered 2020-05-30: 10 mL

## 2020-05-29 MED ORDER — CHLORHEXIDINE GLUCONATE 0.12 % MT SOLN
15.0000 mL | OROMUCOSAL | Status: AC
  Administered 2020-05-29: 15 mL via OROMUCOSAL

## 2020-05-29 MED ORDER — PANTOPRAZOLE SODIUM 40 MG PO TBEC: 40.0000 mg | DELAYED_RELEASE_TABLET | Freq: Every day | ORAL | Status: DC

## 2020-05-29 MED ORDER — 0.9 % SODIUM CHLORIDE (POUR BTL) OPTIME
TOPICAL | Status: DC | PRN
  Administered 2020-05-29: 5000 mL

## 2020-05-29 MED ORDER — MORPHINE SULFATE (PF) 2 MG/ML IV SOLN
1.0000 mg | INTRAVENOUS | Status: DC | PRN
  Administered 2020-05-29: 2 mg via INTRAVENOUS
  Administered 2020-05-29: 4 mg via INTRAVENOUS
  Administered 2020-05-29 (×2): 1 mg via INTRAVENOUS
  Administered 2020-05-30: 4 mg via INTRAVENOUS
  Filled 2020-05-29: qty 2
  Filled 2020-05-29: qty 1
  Filled 2020-05-29: qty 2
  Filled 2020-05-29 (×2): qty 1

## 2020-05-29 MED ORDER — ARTIFICIAL TEARS OPHTHALMIC OINT
TOPICAL_OINTMENT | OPHTHALMIC | Status: DC | PRN
  Administered 2020-05-29: 1 via OPHTHALMIC

## 2020-05-29 MED ORDER — METOPROLOL TARTRATE 5 MG/5ML IV SOLN: 2.5000 mg | INTRAVENOUS | Status: DC | PRN

## 2020-05-29 MED ORDER — ACETAMINOPHEN 160 MG/5ML PO SOLN: 1000.0000 mg | Freq: Four times a day (QID) | ORAL | Status: DC

## 2020-05-29 MED ORDER — FENTANYL CITRATE (PF) 100 MCG/2ML IJ SOLN
INTRAMUSCULAR | Status: DC | PRN
  Administered 2020-05-29 (×2): 100 ug via INTRAVENOUS
  Administered 2020-05-29: 150 ug via INTRAVENOUS
  Administered 2020-05-29 (×2): 100 ug via INTRAVENOUS
  Administered 2020-05-29 (×3): 150 ug via INTRAVENOUS
  Administered 2020-05-29: 100 ug via INTRAVENOUS

## 2020-05-29 MED ORDER — MAGNESIUM SULFATE 4 GM/100ML IV SOLN
4.0000 g | Freq: Once | INTRAVENOUS | Status: AC
  Administered 2020-05-29: 4 g via INTRAVENOUS
  Filled 2020-05-29: qty 100

## 2020-05-29 MED ORDER — SODIUM CHLORIDE (PF) 0.9 % IJ SOLN
OROMUCOSAL | Status: DC | PRN
  Administered 2020-05-29 (×3): 4 mL via TOPICAL

## 2020-05-29 MED ORDER — EPHEDRINE 5 MG/ML INJ
INTRAVENOUS | Status: AC
  Filled 2020-05-29: qty 10

## 2020-05-29 MED ORDER — ASPIRIN 81 MG PO CHEW: 324.0000 mg | CHEWABLE_TABLET | Freq: Every day | ORAL | Status: DC

## 2020-05-29 MED ORDER — LACTATED RINGERS IV SOLN: 500.0000 mL | Freq: Once | INTRAVENOUS | Status: DC | PRN

## 2020-05-29 MED ORDER — SODIUM CHLORIDE 0.9% FLUSH: 10.0000 mL | INTRAVENOUS | Status: DC | PRN

## 2020-05-29 MED ORDER — BISACODYL 5 MG PO TBEC
10.0000 mg | DELAYED_RELEASE_TABLET | Freq: Every day | ORAL | Status: DC
  Filled 2020-05-29: qty 2

## 2020-05-29 MED ORDER — ALBUMIN HUMAN 5 % IV SOLN: INTRAVENOUS | Status: DC | PRN

## 2020-05-29 MED ORDER — HEPARIN SODIUM (PORCINE) 1000 UNIT/ML IJ SOLN
INTRAMUSCULAR | Status: DC | PRN
  Administered 2020-05-29: 29000 [IU] via INTRAVENOUS

## 2020-05-29 MED ORDER — ALBUMIN HUMAN 5 % IV SOLN
250.0000 mL | INTRAVENOUS | Status: AC | PRN
  Administered 2020-05-29 (×3): 12.5 g via INTRAVENOUS
  Filled 2020-05-29: qty 250

## 2020-05-29 MED ORDER — LACTATED RINGERS IV SOLN: INTRAVENOUS | Status: DC

## 2020-05-29 MED ORDER — ASPIRIN EC 325 MG PO TBEC
325.0000 mg | DELAYED_RELEASE_TABLET | Freq: Every day | ORAL | Status: DC
  Administered 2020-05-30: 325 mg via ORAL
  Filled 2020-05-29: qty 1

## 2020-05-29 MED ORDER — SODIUM CHLORIDE 0.9% FLUSH
3.0000 mL | Freq: Two times a day (BID) | INTRAVENOUS | Status: DC
  Administered 2020-05-30: 10 mL via INTRAVENOUS
  Administered 2020-05-30: 3 mL via INTRAVENOUS

## 2020-05-29 MED ORDER — PHENYLEPHRINE HCL-NACL 20-0.9 MG/250ML-% IV SOLN: 0.0000 ug/min | INTRAVENOUS | Status: DC

## 2020-05-29 MED ORDER — PROTAMINE SULFATE 10 MG/ML IV SOLN
INTRAVENOUS | Status: DC | PRN
  Administered 2020-05-29: 290 mg via INTRAVENOUS

## 2020-05-29 MED ORDER — VASOPRESSIN 20 UNIT/ML IV SOLN
INTRAVENOUS | Status: DC | PRN
  Administered 2020-05-29: 1 [IU] via INTRAVENOUS

## 2020-05-29 MED ORDER — SODIUM CHLORIDE 0.9 % IV SOLN
1.5000 g | Freq: Two times a day (BID) | INTRAVENOUS | Status: AC
  Administered 2020-05-29 – 2020-05-31 (×4): 1.5 g via INTRAVENOUS
  Filled 2020-05-29 (×4): qty 1.5

## 2020-05-29 MED ORDER — POTASSIUM CHLORIDE 10 MEQ/50ML IV SOLN: 10.0000 meq | INTRAVENOUS | Status: AC

## 2020-05-29 MED ORDER — CALCIUM CHLORIDE 10 % IV SOLN
INTRAVENOUS | Status: DC | PRN
  Administered 2020-05-29: 100 mg via INTRAVENOUS
  Administered 2020-05-29: 900 mg via INTRAVENOUS

## 2020-05-29 MED ORDER — MIDAZOLAM HCL (PF) 10 MG/2ML IJ SOLN
INTRAMUSCULAR | Status: AC
  Filled 2020-05-29: qty 2

## 2020-05-29 MED ORDER — ROCURONIUM BROMIDE 10 MG/ML (PF) SYRINGE
PREFILLED_SYRINGE | INTRAVENOUS | Status: AC
  Filled 2020-05-29: qty 20

## 2020-05-29 MED ORDER — DEXTROSE 50 % IV SOLN: 0.0000 mL | INTRAVENOUS | Status: DC | PRN

## 2020-05-29 MED ORDER — SODIUM CHLORIDE 0.9 % IV SOLN: 250.0000 mL | INTRAVENOUS | Status: DC

## 2020-05-29 MED ORDER — NITROGLYCERIN IN D5W 200-5 MCG/ML-% IV SOLN: 0.0000 ug/min | INTRAVENOUS | Status: DC

## 2020-05-29 MED ORDER — TRAMADOL HCL 50 MG PO TABS
50.0000 mg | ORAL_TABLET | ORAL | Status: DC | PRN
  Administered 2020-05-30: 100 mg via ORAL
  Filled 2020-05-29: qty 2

## 2020-05-29 MED ORDER — PLASMA-LYTE 148 IV SOLN
INTRAVENOUS | Status: DC | PRN
  Administered 2020-05-29: 500 mL via INTRAVASCULAR

## 2020-05-29 MED ORDER — CHLORHEXIDINE GLUCONATE CLOTH 2 % EX PADS
6.0000 | MEDICATED_PAD | Freq: Every day | CUTANEOUS | Status: DC
  Administered 2020-05-29 – 2020-05-30 (×2): 6 via TOPICAL

## 2020-05-29 MED ORDER — ONDANSETRON HCL 4 MG/2ML IJ SOLN: 4.0000 mg | Freq: Four times a day (QID) | INTRAMUSCULAR | Status: DC | PRN

## 2020-05-29 MED ORDER — HEMOSTATIC AGENTS (NO CHARGE) OPTIME
TOPICAL | Status: DC | PRN
  Administered 2020-05-29: 1 via TOPICAL

## 2020-05-29 MED ORDER — PROPOFOL 10 MG/ML IV BOLUS
INTRAVENOUS | Status: AC
  Filled 2020-05-29: qty 20

## 2020-05-29 MED ORDER — ORAL CARE MOUTH RINSE
15.0000 mL | Freq: Two times a day (BID) | OROMUCOSAL | Status: DC
  Administered 2020-05-30 – 2020-06-07 (×11): 15 mL via OROMUCOSAL

## 2020-05-29 MED ORDER — METOPROLOL TARTRATE 12.5 MG HALF TABLET
12.5000 mg | ORAL_TABLET | Freq: Two times a day (BID) | ORAL | Status: DC
  Administered 2020-05-30 (×2): 12.5 mg via ORAL
  Filled 2020-05-29 (×2): qty 1

## 2020-05-29 SURGICAL SUPPLY — 76 items
BAG DECANTER FOR FLEXI CONT (MISCELLANEOUS) ×4 IMPLANT
BLADE CLIPPER SURG (BLADE) ×4 IMPLANT
BLADE STERNUM SYSTEM 6 (BLADE) ×4 IMPLANT
BNDG ELASTIC 4X5.8 VLCR STR LF (GAUZE/BANDAGES/DRESSINGS) ×4 IMPLANT
BNDG ELASTIC 6X5.8 VLCR STR LF (GAUZE/BANDAGES/DRESSINGS) ×4 IMPLANT
BNDG GAUZE ELAST 4 BULKY (GAUZE/BANDAGES/DRESSINGS) ×4 IMPLANT
CABLE PACING FASLOC BIEGE (MISCELLANEOUS) ×4 IMPLANT
CANISTER SUCT 3000ML PPV (MISCELLANEOUS) ×4 IMPLANT
CATH CPB KIT GERHARDT (MISCELLANEOUS) ×4 IMPLANT
CATH THORACIC 28FR (CATHETERS) ×4 IMPLANT
CNTNR URN SCR LID CUP LEK RST (MISCELLANEOUS) ×3 IMPLANT
CONT SPEC 4OZ STRL OR WHT (MISCELLANEOUS) ×4
DERMABOND ADHESIVE PROPEN (GAUZE/BANDAGES/DRESSINGS) ×1
DERMABOND ADVANCED .7 DNX6 (GAUZE/BANDAGES/DRESSINGS) ×3 IMPLANT
DRAIN CHANNEL 28F RND 3/8 FF (WOUND CARE) ×4 IMPLANT
DRAPE CARDIOVASCULAR INCISE (DRAPES) ×4
DRAPE SLUSH/WARMER DISC (DRAPES) ×4 IMPLANT
DRAPE SRG 135X102X78XABS (DRAPES) ×3 IMPLANT
DRSG AQUACEL AG ADV 3.5X14 (GAUZE/BANDAGES/DRESSINGS) ×4 IMPLANT
ELECT BLADE 4.0 EZ CLEAN MEGAD (MISCELLANEOUS) ×4
ELECT REM PT RETURN 9FT ADLT (ELECTROSURGICAL) ×8
ELECTRODE BLDE 4.0 EZ CLN MEGD (MISCELLANEOUS) ×3 IMPLANT
ELECTRODE REM PT RTRN 9FT ADLT (ELECTROSURGICAL) ×6 IMPLANT
FELT TEFLON 1X6 (MISCELLANEOUS) ×8 IMPLANT
FILTER SMOKE EVAC ULPA (FILTER) ×4 IMPLANT
GAUZE SPONGE 4X4 12PLY STRL (GAUZE/BANDAGES/DRESSINGS) ×8 IMPLANT
GAUZE SPONGE 4X4 12PLY STRL LF (GAUZE/BANDAGES/DRESSINGS) ×8 IMPLANT
GLOVE BIO SURGEON STRL SZ 6.5 (GLOVE) ×24 IMPLANT
GOWN STRL REUS W/ TWL LRG LVL3 (GOWN DISPOSABLE) ×12 IMPLANT
GOWN STRL REUS W/TWL LRG LVL3 (GOWN DISPOSABLE) ×16
HEMOSTAT POWDER SURGIFOAM 1G (HEMOSTASIS) ×12 IMPLANT
HEMOSTAT SURGICEL 2X14 (HEMOSTASIS) ×4 IMPLANT
KIT BASIN OR (CUSTOM PROCEDURE TRAY) ×4 IMPLANT
KIT CATH SUCT 8FR (CATHETERS) ×4 IMPLANT
KIT SUCTION CATH 14FR (SUCTIONS) ×8 IMPLANT
KIT TURNOVER KIT B (KITS) ×4 IMPLANT
KIT VASOVIEW HEMOPRO 2 VH 4000 (KITS) ×8 IMPLANT
LEAD PACING MYOCARDI (MISCELLANEOUS) ×4 IMPLANT
MARKER GRAFT CORONARY BYPASS (MISCELLANEOUS) ×12 IMPLANT
NS IRRIG 1000ML POUR BTL (IV SOLUTION) ×20 IMPLANT
PACK E OPEN HEART (SUTURE) ×4 IMPLANT
PACK OPEN HEART (CUSTOM PROCEDURE TRAY) ×4 IMPLANT
PAD ARMBOARD 7.5X6 YLW CONV (MISCELLANEOUS) ×8 IMPLANT
PAD ELECT DEFIB RADIOL ZOLL (MISCELLANEOUS) ×4 IMPLANT
PENCIL BUTTON HOLSTER BLD 10FT (ELECTRODE) ×4 IMPLANT
PENCIL SMOKE EVACUATOR (MISCELLANEOUS) ×4 IMPLANT
POSITIONER HEAD DONUT 9IN (MISCELLANEOUS) ×4 IMPLANT
PUNCH AORTIC ROTATE 4.0MM (MISCELLANEOUS) ×4 IMPLANT
SET CARDIOPLEGIA MPS 5001102 (MISCELLANEOUS) ×4 IMPLANT
SLEEVE SUCTION 125 (MISCELLANEOUS) ×4 IMPLANT
SPONGE LAP 18X18 RF (DISPOSABLE) IMPLANT
SPONGE LAP 18X18 X RAY DECT (DISPOSABLE) ×12 IMPLANT
SUT BONE WAX W31G (SUTURE) ×4 IMPLANT
SUT PROLENE 3 0 SH1 36 (SUTURE) ×4 IMPLANT
SUT PROLENE 4 0 TF (SUTURE) ×8 IMPLANT
SUT PROLENE 6 0 C 1 30 (SUTURE) ×8 IMPLANT
SUT PROLENE 6 0 CC (SUTURE) ×8 IMPLANT
SUT PROLENE 7 0 BV 1 (SUTURE) ×4 IMPLANT
SUT PROLENE 7 0 BV1 MDA (SUTURE) ×4 IMPLANT
SUT PROLENE 8 0 BV175 6 (SUTURE) ×12 IMPLANT
SUT SILK 2 0 SH CR/8 (SUTURE) ×4 IMPLANT
SUT STEEL 6MS V (SUTURE) ×4 IMPLANT
SUT STEEL SZ 6 DBL 3X14 BALL (SUTURE) ×4 IMPLANT
SUT VIC AB 1 CTX 18 (SUTURE) ×8 IMPLANT
SUT VIC AB 2-0 CT1 27 (SUTURE) ×8
SUT VIC AB 2-0 CT1 TAPERPNT 27 (SUTURE) ×6 IMPLANT
SYSTEM SAHARA CHEST DRAIN ATS (WOUND CARE) ×4 IMPLANT
TAPE CLOTH SURG 4X10 WHT LF (GAUZE/BANDAGES/DRESSINGS) ×4 IMPLANT
TAPE PAPER 2X10 WHT MICROPORE (GAUZE/BANDAGES/DRESSINGS) ×4 IMPLANT
TOWEL GREEN STERILE (TOWEL DISPOSABLE) ×4 IMPLANT
TOWEL GREEN STERILE FF (TOWEL DISPOSABLE) ×4 IMPLANT
TRAP FLUID SMOKE EVACUATOR (MISCELLANEOUS) ×4 IMPLANT
TRAY FOLEY SLVR 16FR TEMP STAT (SET/KITS/TRAYS/PACK) ×4 IMPLANT
TUBING LAP HI FLOW INSUFFLATIO (TUBING) ×4 IMPLANT
UNDERPAD 30X36 HEAVY ABSORB (UNDERPADS AND DIAPERS) ×4 IMPLANT
WATER STERILE IRR 1000ML POUR (IV SOLUTION) ×8 IMPLANT

## 2020-05-29 NOTE — Anesthesia Procedure Notes (Signed)
Central Venous Catheter Insertion Performed by: Roberts Gaudy, MD, anesthesiologist Start/End11/11/2019 6:50 AM, 05/29/2020 6:55 AM Patient location: Pre-op. Preanesthetic checklist: patient identified, IV checked, site marked, risks and benefits discussed, surgical consent, monitors and equipment checked, pre-op evaluation, timeout performed and anesthesia consent Hand hygiene performed  and maximum sterile barriers used  PA cath was placed.Swan type:thermodilution Procedure performed without using ultrasound guided technique. Attempts: 1 Following insertion, line sutured, dressing applied and Biopatch. Post procedure assessment: blood return through all ports  Patient tolerated the procedure well with no immediate complications. Additional procedure comments: Inserted to 50 cm at insertion site.

## 2020-05-29 NOTE — Anesthesia Procedure Notes (Signed)
Procedure Name: Intubation Date/Time: 05/29/2020 7:41 AM Performed by: Dorthea Cove, CRNA Pre-anesthesia Checklist: Patient identified, Emergency Drugs available, Suction available and Patient being monitored Patient Re-evaluated:Patient Re-evaluated prior to induction Oxygen Delivery Method: Circle System Utilized Preoxygenation: Pre-oxygenation with 100% oxygen Induction Type: IV induction Ventilation: Mask ventilation without difficulty Laryngoscope Size: Mac and 4 Grade View: Grade I Tube type: Oral Tube size: 8.0 mm Number of attempts: 1 Airway Equipment and Method: Stylet and Oral airway Placement Confirmation: ETT inserted through vocal cords under direct vision,  positive ETCO2 and breath sounds checked- equal and bilateral Secured at: 21 cm Tube secured with: Tape Dental Injury: Teeth and Oropharynx as per pre-operative assessment

## 2020-05-29 NOTE — Anesthesia Postprocedure Evaluation (Signed)
Anesthesia Post Note  Patient: Clayton Powell  Procedure(s) Performed: CORONARY ARTERY BYPASS GRAFTING (CABG) TIMES THREE USING LIMA to LAD; ENDOSCOPIC HARVESTED RIGHT GREATER SAPHENOUS VEIN: SVG to PD; SVG to RAMUS. (N/A Chest) TRANSESOPHAGEAL ECHOCARDIOGRAM (TEE) (N/A ) ENDOVEIN HARVEST OF GREATER SAPHENOUS VEIN (Right Leg Upper)     Patient location during evaluation: SICU Anesthesia Type: General Level of consciousness: sedated Pain management: pain level controlled Vital Signs Assessment: post-procedure vital signs reviewed and stable Respiratory status: patient remains intubated per anesthesia plan Cardiovascular status: stable Postop Assessment: no apparent nausea or vomiting Anesthetic complications: no   No complications documented.  Last Vitals:  Vitals:   05/29/20 1545 05/29/20 1600  BP:  (!) 134/93  Pulse: 70 70  Resp: 18 (!) 21  Temp: 36.5 C (!) 36.4 C  SpO2: 95% 96%    Last Pain:  Vitals:   05/29/20 1400  TempSrc: Core (Comment)  PainSc:                  Effie Berkshire

## 2020-05-29 NOTE — Progress Notes (Signed)
Pre Procedure note for inpatients:   Clayton Powell has been scheduled for Procedure(s): CORONARY ARTERY BYPASS GRAFTING (CABG) (N/A) TRANSESOPHAGEAL ECHOCARDIOGRAM (TEE) (N/A) today. The various methods of treatment have been discussed with the patient. After consideration of the risks, benefits and treatment options the patient has consented to the planned procedure.   The patient has been seen and labs reviewed. There are no changes in the patient's condition to prevent proceeding with the planned procedure today.  Recent labs:  Lab Results  Component Value Date   WBC 7.9 05/29/2020   HGB 13.7 05/29/2020   HCT 40.9 05/29/2020   PLT 200 05/29/2020   GLUCOSE 99 05/29/2020   CHOL 143 05/26/2020   TRIG 141 05/26/2020   HDL 52 05/26/2020   LDLCALC 63 05/26/2020   ALT 29 05/25/2020   AST 34 05/25/2020   NA 137 05/29/2020   K 4.2 05/29/2020   CL 106 05/29/2020   CREATININE 1.02 05/29/2020   BUN 22 05/29/2020   CO2 21 (L) 05/29/2020   TSH 2.478 02/05/2003   PSA 4.0 12/26/2005   INR 1.1 05/28/2020   HGBA1C 5.4 05/28/2020    Grace Isaac, MD 05/29/2020 7:15 AM

## 2020-05-29 NOTE — Brief Op Note (Signed)
      [address]       ,[place] [address]             [phone]       05/29/2020  2:30 PM  PATIENT:  Clayton Powell  74 y.o. male  PRE-OPERATIVE DIAGNOSIS:  CORONARY ARTERY DISEASE  POST-OPERATIVE DIAGNOSIS:  CORONARY ARTERY DISEASE  PROCEDURE:  Procedure(s): CORONARY ARTERY BYPASS GRAFTING (CABG) TIMES THREE USING LIMA to LAD; ENDOSCOPIC HARVESTED RIGHT GREATER SAPHENOUS VEIN: SVG to PD; SVG to RAMUS. (N/A) TRANSESOPHAGEAL ECHOCARDIOGRAM (TEE) (N/A) ENDOVEIN HARVEST OF GREATER SAPHENOUS VEIN (Right)  SURGEON:  Surgeon(s) and Role:    * Grace Isaac, MD - Primary  PHYSICIAN ASSISTANT: WAYNE GOLD PA-C   ASSISTANTS: STAFF   ANESTHESIA:   general  EBL:  600 ml  BLOOD ADMINISTERED:none  DRAINS: LEFT PLEURAL AND MEDIASTINAL CHEST TUBES   LOCAL MEDICATIONS USED:  NONE  SPECIMEN:  Source of Specimen:  INCIDENTAL LEFT MAMMARY LN  DISPOSITION OF SPECIMEN:  PATHOLOGY  COUNTS:  YES   DICTATION: .Other Dictation: Dictation Number PENDING  PLAN OF CARE: Admit to inpatient   PATIENT DISPOSITION:  ICU - intubated and hemodynamically stable.   Delay start of Pharmacological VTE agent (>24hrs) due to surgical blood loss or risk of bleeding: yes  COMPLICATIONS: NO KNOWN

## 2020-05-29 NOTE — Plan of Care (Signed)
  Problem: Clinical Measurements: Goal: Ability to maintain clinical measurements within normal limits will improve Outcome: Progressing Goal: Will remain free from infection Outcome: Progressing Goal: Respiratory complications will improve Outcome: Progressing Goal: Cardiovascular complication will be avoided Outcome: Progressing   Problem: Coping: Goal: Level of anxiety will decrease Outcome: Progressing   Problem: Pain Managment: Goal: General experience of comfort will improve Outcome: Progressing   Problem: Cardiac: Goal: Will achieve and/or maintain hemodynamic stability Outcome: Progressing   Problem: Clinical Measurements: Goal: Postoperative complications will be avoided or minimized Outcome: Progressing   Problem: Respiratory: Goal: Respiratory status will improve Outcome: Progressing

## 2020-05-29 NOTE — Anesthesia Procedure Notes (Signed)
Arterial Line Insertion Start/End11/11/2019 6:45 AM, 05/29/2020 7:00 AM Performed by: Dorthea Cove, CRNA, CRNA  Patient location: Pre-op. Preanesthetic checklist: patient identified, IV checked, site marked, risks and benefits discussed, surgical consent, monitors and equipment checked, pre-op evaluation, timeout performed and anesthesia consent Lidocaine 1% used for infiltration radial was placed Catheter size: 20 Fr Hand hygiene performed  and maximum sterile barriers used   Attempts: 1 Procedure performed without using ultrasound guided technique. Following insertion, dressing applied and Biopatch. Post procedure assessment: normal and unchanged  Patient tolerated the procedure well with no immediate complications.

## 2020-05-29 NOTE — Progress Notes (Signed)
Patient ID: Clayton Powell, male   DOB: 15-Aug-1945, 74 y.o.   MRN: [id number] EVENING ROUNDS NOTE :     [address]       [place] [address]             [phone]                 Day of Surgery Procedure(s) (LRB): CORONARY ARTERY BYPASS GRAFTING (CABG) TIMES THREE USING LIMA to LAD; ENDOSCOPIC HARVESTED RIGHT GREATER SAPHENOUS VEIN: SVG to PD; SVG to RAMUS. (N/A) TRANSESOPHAGEAL ECHOCARDIOGRAM (TEE) (N/A) ENDOVEIN HARVEST OF GREATER SAPHENOUS VEIN (Right)  Total Length of Stay:  LOS: 2 days  BP (!) 134/93   Pulse 70   Temp 97.9 F (36.6 C)   Resp 20   Ht 5\' 6"  (1.676 m)   Wt 89.4 kg   SpO2 96%   BMI 31.81 kg/m   .Intake/Output      11/04 0701 - 11/05 0700 11/05 0701 - 11/06 0700   P.O. 360    I.V. (mL/kg) 231.1 (2.6) 1991.8 (22.3)   Blood  850   IV Piggyback  429.9   Total Intake(mL/kg) 591.1 (6.6) 3271.7 (36.6)   Urine (mL/kg/hr) 576 (0.3) 925 (1.1)   Emesis/NG output  0   Chest Tube  48   Total Output 576 973   Net +15.1 +2298.7          . sodium chloride 20 mL/hr at 05/29/20 1600  . [START ON 05/30/2020] sodium chloride    . sodium chloride    . albumin human 12.5 g (05/29/20 1553)  . cefUROXime (ZINACEF)  IV    . dexmedetomidine (PRECEDEX) IV infusion 0.2 mcg/kg/hr (05/29/20 1600)  . famotidine (PEPCID) IV Stopped (05/29/20 1430)  . insulin 0.9 mL/hr at 05/29/20 1600  . lactated ringers    . lactated ringers 20 mL/hr at 05/29/20 1600  . lactated ringers    . magnesium sulfate 20 mL/hr at 05/29/20 1600  . nitroGLYCERIN 10 mcg/min (05/29/20 1600)  . phenylephrine (NEO-SYNEPHRINE) Adult infusion Stopped (05/29/20 1341)  . potassium chloride    . vancomycin       Lab Results  Component Value Date   WBC 13.3 (H) 05/29/2020   HGB 12.9 (L) 05/29/2020   HCT 38.6 (L) 05/29/2020   PLT 124 (L) 05/29/2020   GLUCOSE 146 (H) 05/29/2020   CHOL 143 05/26/2020   TRIG 141 05/26/2020   HDL 52 05/26/2020   LDLCALC 63 05/26/2020   ALT 29  05/25/2020   AST 34 05/25/2020   NA 138 05/29/2020   K 4.3 05/29/2020   CL 103 05/29/2020   CREATININE 0.90 05/29/2020   BUN 19 05/29/2020   CO2 21 (L) 05/29/2020   TSH 2.478 02/05/2003   PSA 4.0 12/26/2005   INR 1.4 (H) 05/29/2020   HGBA1C 5.4 05/28/2020   Early postop , remains on vent Not bleeding wakening up neuro intact Weaning vent   Grace Isaac MD  Beeper [phone] Office [phone] 05/29/2020 4:39 PM

## 2020-05-29 NOTE — Plan of Care (Signed)
  Problem: Education: Goal: Knowledge of General Education information will improve Description: Including pain rating scale, medication(s)/side effects and non-pharmacologic comfort measures Outcome: Progressing   Problem: Health Behavior/Discharge Planning: Goal: Ability to manage health-related needs will improve Outcome: Progressing   Problem: Clinical Measurements: Goal: Ability to maintain clinical measurements within normal limits will improve Outcome: Progressing Goal: Will remain free from infection Outcome: Progressing Goal: Diagnostic test results will improve Outcome: Progressing Goal: Respiratory complications will improve Outcome: Progressing Goal: Cardiovascular complication will be avoided Outcome: Progressing   Problem: Activity: Goal: Risk for activity intolerance will decrease Outcome: Progressing   Problem: Nutrition: Goal: Adequate nutrition will be maintained Outcome: Progressing   Problem: Coping: Goal: Level of anxiety will decrease Outcome: Progressing   Problem: Elimination: Goal: Will not experience complications related to bowel motility Outcome: Progressing Goal: Will not experience complications related to urinary retention Outcome: Progressing   Problem: Pain Managment: Goal: General experience of comfort will improve Outcome: Progressing   Problem: Safety: Goal: Ability to remain free from injury will improve Outcome: Progressing   Problem: Skin Integrity: Goal: Risk for impaired skin integrity will decrease Outcome: Progressing   Problem: Education: Goal: Will demonstrate proper wound care and an understanding of methods to prevent future damage Outcome: Progressing Goal: Knowledge of disease or condition will improve Outcome: Progressing Goal: Knowledge of the prescribed therapeutic regimen will improve Outcome: Progressing Goal: Individualized Educational Video(s) Outcome: Progressing   Problem: Activity: Goal: Risk for  activity intolerance will decrease Outcome: Progressing   Problem: Cardiac: Goal: Will achieve and/or maintain hemodynamic stability Outcome: Progressing   Problem: Clinical Measurements: Goal: Postoperative complications will be avoided or minimized Outcome: Progressing   Problem: Respiratory: Goal: Respiratory status will improve Outcome: Progressing   Problem: Skin Integrity: Goal: Wound healing without signs and symptoms of infection Outcome: Progressing Goal: Risk for impaired skin integrity will decrease Outcome: Progressing   Problem: Urinary Elimination: Goal: Ability to achieve and maintain adequate renal perfusion and functioning will improve Outcome: Progressing   Problem: Education: Goal: Will demonstrate proper wound care and an understanding of methods to prevent future damage Outcome: Progressing Goal: Knowledge of disease or condition will improve Outcome: Progressing Goal: Knowledge of the prescribed therapeutic regimen will improve Outcome: Progressing Goal: Individualized Educational Video(s) Outcome: Progressing   Problem: Activity: Goal: Risk for activity intolerance will decrease Outcome: Progressing   Problem: Cardiac: Goal: Will achieve and/or maintain hemodynamic stability Outcome: Progressing   Problem: Clinical Measurements: Goal: Postoperative complications will be avoided or minimized Outcome: Progressing   Problem: Respiratory: Goal: Respiratory status will improve Outcome: Progressing   Problem: Skin Integrity: Goal: Wound healing without signs and symptoms of infection Outcome: Progressing Goal: Risk for impaired skin integrity will decrease Outcome: Progressing   Problem: Urinary Elimination: Goal: Ability to achieve and maintain adequate renal perfusion and functioning will improve Outcome: Progressing   

## 2020-05-29 NOTE — Addendum Note (Signed)
Addendum  created 05/29/20 1613 by Effie Berkshire, MD   Clinical Note Signed, Delete clinical note

## 2020-05-29 NOTE — Procedures (Signed)
Extubation Procedure Note  Patient Details:   Name: Clayton Powell DOB: 11/26/1945 MRN: [id number]   Airway Documentation:    Vent end date: 05/29/20 Vent end time: 1700   Evaluation  O2 sats: stable throughout Complications: No apparent complications Patient did tolerate procedure well. Bilateral Breath Sounds: Clear   Yes   Patient extubated per rapid wean protocol. Positive cuff leak. No stridor noted. Vitals are stable on 3L [place]. VC 1.8L, NIF -22. RN at bedside.  Herbie Saxon Louann Hopson 05/29/2020, 5:01 PM

## 2020-05-29 NOTE — Anesthesia Preprocedure Evaluation (Addendum)
Anesthesia Evaluation  Patient identified by MRN, date of birth, ID band Patient awake    Reviewed: Allergy & Precautions, NPO status , Patient's Chart, lab work & pertinent test results  Airway Mallampati: II  TM Distance: >3 FB Neck ROM: Full    Dental  (+) Teeth Intact, Dental Advisory Given   Pulmonary neg pulmonary ROS,    breath sounds clear to auscultation       Cardiovascular hypertension, + CAD   Rhythm:Regular Rate:Normal     Neuro/Psych PSYCHIATRIC DISORDERS negative neurological ROS     GI/Hepatic Neg liver ROS, GERD  ,  Endo/Other  negative endocrine ROS  Renal/GU negative Renal ROS     Musculoskeletal negative musculoskeletal ROS (+)   Abdominal Normal abdominal exam  (+)   Peds  Hematology negative hematology ROS (+)   Anesthesia Other Findings   Reproductive/Obstetrics                            Anesthesia Physical Anesthesia Plan  ASA: IV  Anesthesia Plan: General   Post-op Pain Management:    Induction: Intravenous  PONV Risk Score and Plan: 2 and Ondansetron and Treatment may vary due to age or medical condition  Airway Management Planned: Oral ETT  Additional Equipment: Arterial line, CVP, TEE, PA Cath and Ultrasound Guidance Line Placement  Intra-op Plan:   Post-operative Plan: Post-operative intubation/ventilation  Informed Consent: I have reviewed the patients History and Physical, chart, labs and discussed the procedure including the risks, benefits and alternatives for the proposed anesthesia with the patient or authorized representative who has indicated his/her understanding and acceptance.     Dental advisory given  Plan Discussed with: CRNA  Anesthesia Plan Comments: (Lab Results      Component                Value               Date                      WBC                      7.9                 05/29/2020                HGB                       13.7                05/29/2020                HCT                      40.9                05/29/2020                MCV                      94.2                05/29/2020                PLT  200                 05/29/2020             Echo: 1. Left ventricular ejection fraction, by estimation, is 60 to 65%. The  left ventricle has normal function. The left ventricle has no regional  wall motion abnormalities. Left ventricular diastolic parameters were  normal.  2. Right ventricular systolic function is normal. The right ventricular  size is normal.  3. The mitral valve is normal in structure. Trivial mitral valve  regurgitation.  4. The aortic valve is normal in structure. Aortic valve regurgitation is  not visualized. )       Anesthesia Quick Evaluation

## 2020-05-29 NOTE — Anesthesia Procedure Notes (Signed)
Central Venous Catheter Insertion Performed by: Roberts Gaudy, MD, anesthesiologist Start/End11/11/2019 6:45 AM, 05/29/2020 6:50 AM Patient location: Pre-op. Preanesthetic checklist: patient identified, IV checked, site marked, risks and benefits discussed, surgical consent, monitors and equipment checked, pre-op evaluation, timeout performed and anesthesia consent Lidocaine 1% used for infiltration and patient sedated Hand hygiene performed  and maximum sterile barriers used  Catheter size: 9 Fr Sheath introducer Procedure performed using ultrasound guided technique. Ultrasound Notes:anatomy identified, needle tip was noted to be adjacent to the nerve/plexus identified, no ultrasound evidence of intravascular and/or intraneural injection and image(s) printed for medical record Attempts: 1 Following insertion, line sutured and dressing applied. Post procedure assessment: blood return through all ports, free fluid flow and no air  Patient tolerated the procedure well with no immediate complications.

## 2020-05-29 NOTE — Anesthesia Postprocedure Evaluation (Deleted)
Anesthesia Post Note  Patient: Clayton Powell  Procedure(s) Performed: CORONARY ARTERY BYPASS GRAFTING (CABG) TIMES THREE USING LIMA to LAD; ENDOSCOPIC HARVESTED RIGHT GREATER SAPHENOUS VEIN: SVG to PD; SVG to RAMUS. (N/A Chest) TRANSESOPHAGEAL ECHOCARDIOGRAM (TEE) (N/A ) ENDOVEIN HARVEST OF GREATER SAPHENOUS VEIN (Right Leg Upper)     Patient location during evaluation: PACU Anesthesia Type: General Level of consciousness: awake and alert Pain management: pain level controlled Vital Signs Assessment: post-procedure vital signs reviewed and stable Respiratory status: spontaneous breathing, nonlabored ventilation, respiratory function stable and patient connected to nasal cannula oxygen Cardiovascular status: blood pressure returned to baseline and stable Postop Assessment: no apparent nausea or vomiting Anesthetic complications: no   No complications documented.  Last Vitals:  Vitals:   05/29/20 1545 05/29/20 1600  BP:  (!) 134/93  Pulse: 70 70  Resp: 18 (!) 21  Temp: 36.5 C (!) 36.4 C  SpO2: 95% 96%    Last Pain:  Vitals:   05/29/20 1400  TempSrc: Core (Comment)  PainSc:                  Effie Berkshire

## 2020-05-29 NOTE — Transfer of Care (Signed)
Immediate Anesthesia Transfer of Care Note  Patient: Clayton Powell  Procedure(s) Performed: CORONARY ARTERY BYPASS GRAFTING (CABG) TIMES THREE USING LIMA to LAD; ENDOSCOPIC HARVESTED RIGHT GREATER SAPHENOUS VEIN: SVG to PD; SVG to RAMUS. (N/A Chest) TRANSESOPHAGEAL ECHOCARDIOGRAM (TEE) (N/A ) ENDOVEIN HARVEST OF GREATER SAPHENOUS VEIN (Right Leg Upper)  Patient Location: ICU  Anesthesia Type:General  Level of Consciousness: Patient remains intubated per anesthesia plan  Airway & Oxygen Therapy: Patient remains intubated per anesthesia plan and Patient placed on Ventilator (see vital sign flow sheet for setting)  Post-op Assessment: Report given to RN and Post -op Vital signs reviewed and stable  Post vital signs: Reviewed and stable  Last Vitals:  Vitals Value Taken Time  BP 113/60 (70) 05/29/20 1338  Temp 36.6 C 05/29/20 1352  Pulse 90 05/29/20 1352  Resp 12 05/29/20 1352  SpO2 96 % 05/29/20 1352  Vitals shown include unvalidated device data.  Last Pain:  Vitals:   05/29/20 0353  TempSrc: Oral  PainSc: 0-No pain      Patients Stated Pain Goal: 0 (43/14/27 6701)  Complications: No complications documented.

## 2020-05-30 ENCOUNTER — Inpatient Hospital Stay (HOSPITAL_COMMUNITY): Payer: 59

## 2020-05-30 ENCOUNTER — Other Ambulatory Visit: Payer: Self-pay

## 2020-05-30 LAB — GLUCOSE, CAPILLARY
Glucose-Capillary: 105 mg/dL — ABNORMAL HIGH (ref 70–99)
Glucose-Capillary: 109 mg/dL — ABNORMAL HIGH (ref 70–99)
Glucose-Capillary: 111 mg/dL — ABNORMAL HIGH (ref 70–99)
Glucose-Capillary: 115 mg/dL — ABNORMAL HIGH (ref 70–99)
Glucose-Capillary: 116 mg/dL — ABNORMAL HIGH (ref 70–99)
Glucose-Capillary: 127 mg/dL — ABNORMAL HIGH (ref 70–99)
Glucose-Capillary: 133 mg/dL — ABNORMAL HIGH (ref 70–99)
Glucose-Capillary: 146 mg/dL — ABNORMAL HIGH (ref 70–99)
Glucose-Capillary: 167 mg/dL — ABNORMAL HIGH (ref 70–99)
Glucose-Capillary: 72 mg/dL (ref 70–99)
Glucose-Capillary: 79 mg/dL (ref 70–99)

## 2020-05-30 LAB — BASIC METABOLIC PANEL
Anion gap: 7 (ref 5–15)
Anion gap: 7 (ref 5–15)
BUN: 17 mg/dL (ref 8–23)
BUN: 18 mg/dL (ref 8–23)
CO2: 21 mmol/L — ABNORMAL LOW (ref 22–32)
CO2: 24 mmol/L (ref 22–32)
Calcium: 7.9 mg/dL — ABNORMAL LOW (ref 8.9–10.3)
Calcium: 8.2 mg/dL — ABNORMAL LOW (ref 8.9–10.3)
Chloride: 107 mmol/L (ref 98–111)
Chloride: 106 mmol/L (ref 98–111)
Creatinine, Ser: 1.01 mg/dL (ref 0.61–1.24)
Creatinine, Ser: 0.97 mg/dL (ref 0.61–1.24)
GFR, Estimated: >60 mL/min (ref >60)
GFR, Estimated: >60 mL/min (ref >60)
Glucose, Bld: 116 mg/dL — ABNORMAL HIGH (ref 70–99)
Glucose, Bld: 148 mg/dL — ABNORMAL HIGH (ref 70–99)
Potassium: 4.4 mmol/L (ref 3.5–5.1)
Potassium: 4.1 mmol/L (ref 3.5–5.1)
Sodium: 135 mmol/L (ref 135–145)
Sodium: 137 mmol/L (ref 135–145)

## 2020-05-30 LAB — CBC
HCT: 34.4 % — ABNORMAL LOW (ref 39.0–52.0)
HCT: 35.3 % — ABNORMAL LOW (ref 39.0–52.0)
Hemoglobin: 11.1 g/dL — ABNORMAL LOW (ref 13.0–17.0)
Hemoglobin: 11.5 g/dL — ABNORMAL LOW (ref 13.0–17.0)
MCH: 30.7 pg (ref 26.0–34.0)
MCH: 31.5 pg (ref 26.0–34.0)
MCHC: 32.3 g/dL (ref 30.0–36.0)
MCHC: 32.6 g/dL (ref 30.0–36.0)
MCV: 95 fL (ref 80.0–100.0)
MCV: 96.7 fL (ref 80.0–100.0)
Platelets: 124 10*3/uL — ABNORMAL LOW (ref 150–400)
Platelets: 132 10*3/uL — ABNORMAL LOW (ref 150–400)
RBC: 3.62 MIL/uL — ABNORMAL LOW (ref 4.22–5.81)
RBC: 3.65 MIL/uL — ABNORMAL LOW (ref 4.22–5.81)
RDW: 12.8 % (ref 11.5–15.5)
RDW: 13 % (ref 11.5–15.5)
WBC: 9.6 10*3/uL (ref 4.0–10.5)
WBC: 10.2 10*3/uL (ref 4.0–10.5)
nRBC: 0 % (ref 0.0–0.2)
nRBC: 0 % (ref 0.0–0.2)

## 2020-05-30 LAB — MAGNESIUM
Magnesium: 2.2 mg/dL (ref 1.7–2.4)
Magnesium: 2.2 mg/dL (ref 1.7–2.4)

## 2020-05-30 MED ORDER — ENOXAPARIN SODIUM 40 MG/0.4ML ~~LOC~~ SOLN
40.0000 mg | Freq: Every day | SUBCUTANEOUS | Status: DC
  Administered 2020-05-30 – 2020-06-06 (×8): 40 mg via SUBCUTANEOUS
  Filled 2020-05-30 (×8): qty 0.4

## 2020-05-30 MED ORDER — INSULIN ASPART 100 UNIT/ML ~~LOC~~ SOLN
0.0000 [IU] | SUBCUTANEOUS | Status: DC
  Administered 2020-05-30: 4 [IU] via SUBCUTANEOUS
  Administered 2020-05-30 – 2020-05-31 (×3): 2 [IU] via SUBCUTANEOUS

## 2020-05-30 NOTE — Progress Notes (Signed)
Patient ID: Clayton Powell, male   DOB: 07-01-1946, 74 y.o.   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 (N/A) TRANSESOPHAGEAL ECHOCARDIOGRAM (TEE) (N/A) ENDOVEIN HARVEST OF GREATER SAPHENOUS VEIN (Right)  Total Length of Stay:  LOS: 3 days   Subjective: Patient extubated last night without difficulty, awake alert neurologically intact this a.m.  Objective: Vital signs in last 24 hours: Temp:  [97 F (36.1 C)-99.7 F (37.6 C)] 99.5 F (37.5 C) (11/06 0600) Pulse Rate:  [58-90] 61 (11/06 0600) Cardiac Rhythm: Normal sinus rhythm (11/06 0400) Resp:  [0-29] 18 (11/06 0600) BP: (109-156)/(64-93) 128/68 (11/06 0600) SpO2:  [94 %-99 %] 95 % (11/06 0600) Arterial Line BP: (86-162)/(48-106) 120/92 (11/06 0600) FiO2 (%):  [40 %-50 %] 40 % (11/05 1625) Weight:  [93.3 kg] 93.3 kg (11/06 0500)  Filed Weights   05/29/20 0530 05/30/20 0500  Weight: 89.4 kg 93.3 kg    Weight change: 3.896 kg   Hemodynamic parameters for last 24 hours: PAP: (8-29)/(2-16) 29/13 CO:  [4.6 L/min-7.2 L/min] 5.8 L/min CI:  [2.3 L/min/m2-3.6 L/min/m2] 2.9 L/min/m2  Intake/Output from previous day: 11/05 0701 - 11/06 0700 In: 4809.2 [P.O.:120; I.V.:2606.5; Blood:850; IV Piggyback:1232.7] Out: 2011 [Urine:1635; Chest Tube:376]  Intake/Output this shift: No intake/output data recorded.  Current Meds: Scheduled Meds: . acetaminophen  1,000 mg Oral Q6H   Or  . acetaminophen (TYLENOL) oral liquid 160 mg/5 mL  1,000 mg Per Tube Q6H  . alfuzosin  10 mg Oral QHS  . aspirin EC  325 mg Oral Daily   Or  . aspirin  324 mg Per Tube Daily  . atorvastatin  80 mg Oral q1800  . bisacodyl  10 mg Oral Daily   Or  . bisacodyl  10  mg Rectal Daily  . chlorhexidine  15 mL Mouth Rinse BID  . Chlorhexidine Gluconate Cloth  6 each Topical Daily  . docusate sodium  200 mg Oral Daily  . finasteride  5 mg Oral QHS  . icosapent Ethyl  2 g Oral BID  . mouth rinse  15 mL Mouth Rinse q12n4p  . metoprolol tartrate  12.5 mg Oral BID   Or  . metoprolol tartrate  12.5 mg Per Tube BID  . [START ON 05/31/2020] pantoprazole  40 mg Oral Daily  . sodium chloride flush  10-40 mL Intracatheter Q12H  . sodium chloride flush  3 mL Intravenous Q12H   Continuous Infusions: . sodium chloride 20 mL/hr at 05/30/20 0500  . sodium chloride    . sodium chloride    . albumin human 12.5 g (05/29/20 1553)  . cefUROXime (ZINACEF)  IV Stopped (05/30/20 0428)  . dexmedetomidine (PRECEDEX) IV infusion Stopped (05/29/20 1602)  . insulin 0.5 mL/hr at 05/30/20 0500  . lactated ringers    . lactated ringers 20 mL/hr at 05/30/20 0500  . nitroGLYCERIN Stopped (05/30/20 0443)  . phenylephrine (NEO-SYNEPHRINE) Adult infusion Stopped (05/29/20 1341)   PRN Meds:.sodium chloride, albumin human, dextrose, lactated ringers, metoprolol tartrate, midazolam, morphine injection, ondansetron (ZOFRAN) IV, oxyCODONE, sodium chloride flush, sodium chloride flush,  traMADol  General appearance: alert, cooperative and no distress Neurologic: intact Heart: regular rate and rhythm, S1, S2 normal, no murmur, click, rub or gallop Lungs: diminished breath sounds bibasilar Abdomen: soft, non-tender; bowel sounds normal; no masses,  no organomegaly Extremities: extremities normal, atraumatic, no cyanosis or edema and Homans sign is negative, no sign of DVT Wound: Dressings intact sternum stable  Lab Results: CBC: Recent Labs    05/29/20 2000 05/30/20 0342  WBC 11.5* 9.6  HGB 11.6* 11.1*  HCT 35.5* 34.4*  PLT 127* 124*   BMET:  Recent Labs    05/29/20 2000 05/30/20 0342  NA 135 135  K 4.4 4.4  CL 107 107  CO2 19* 21*  GLUCOSE 132* 116*  BUN 16 17   CREATININE 0.96 1.01  CALCIUM 8.0* 7.9*    CMET: Lab Results  Component Value Date   WBC 9.6 05/30/2020   HGB 11.1 (L) 05/30/2020   HCT 34.4 (L) 05/30/2020   PLT 124 (L) 05/30/2020   GLUCOSE 116 (H) 05/30/2020   CHOL 143 05/26/2020   TRIG 141 05/26/2020   HDL 52 05/26/2020   LDLCALC 63 05/26/2020   ALT 29 05/25/2020   AST 34 05/25/2020   NA 135 05/30/2020   K 4.4 05/30/2020   CL 107 05/30/2020   CREATININE 1.01 05/30/2020   BUN 17 05/30/2020   CO2 21 (L) 05/30/2020   TSH 2.478 02/05/2003   PSA 4.0 12/26/2005   INR 1.4 (H) 05/29/2020   HGBA1C 5.4 05/28/2020      PT/INR:  Recent Labs    05/29/20 1412  LABPROT 16.5*  INR 1.4*   Radiology: DG Chest Port 1 View  Result Date: 05/29/2020 CLINICAL DATA:  CABG.  Intubation. EXAM: PORTABLE CHEST 1 VIEW COMPARISON:  05/25/2020. FINDINGS: Endotracheal tube noted with its tip 3 cm above the carina. NG tube tip noted just below left hemidiaphragm. Side hole is just above the gastroesophageal junction. Advancement of the NG tube approximately 10 cm should be considered. Swan-Ganz catheter noted with tip coiled in the pulmonary outflow tract. Mediastinal drainage tube and left chest tube in good anatomic position. Very tiny left apical pneumothorax cannot be excluded. Low lung volumes with bibasilar atelectasis. No pleural effusion. IMPRESSION: 1. Endotracheal tube tip 3 cm above the carina. Swan-Ganz catheter noted with tip coiled in the pulmonary outflow tract. Mediastinal drainage tube and left chest tube in good anatomic position. Very tiny left apical pneumothorax cannot be excluded. 2. NG tube tip noted just below left hemidiaphragm. Side hole is just above the gastroesophageal junction. Advancement of the NG tube approximately 10 cm should be considered. 3. Prior CABG.  Heart size stable. 4. Low lung volumes with bibasilar atelectasis. These results will be called to the ordering clinician or representative by the Radiologist  Assistant, and communication documented in the PACS or [employer]. 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 (N/A) TRANSESOPHAGEAL ECHOCARDIOGRAM (TEE) (N/A) ENDOVEIN HARVEST OF GREATER SAPHENOUS VEIN (Right) Mobilize Diuresis d/c tubes/lines See progression orders Expected Acute  Blood - loss Anemia- continue to monitor so far no blood products given     Grace Isaac 05/30/2020 8:26 AM

## 2020-05-30 NOTE — Progress Notes (Signed)
Per Servando Snare MD verbal order, pt's foley stays in until tomorrow due to hx of BPH.  Jill Poling, RN 05/30/2020

## 2020-05-30 NOTE — Plan of Care (Signed)
  Problem: Education: Goal: Knowledge of General Education information will improve Description: Including pain rating scale, medication(s)/side effects and non-pharmacologic comfort measures Outcome: Progressing   Problem: Health Behavior/Discharge Planning: Goal: Ability to manage health-related needs will improve Outcome: Progressing   Problem: Clinical Measurements: Goal: Ability to maintain clinical measurements within normal limits will improve Outcome: Progressing Goal: Will remain free from infection Outcome: Progressing Goal: Diagnostic test results will improve Outcome: Progressing Goal: Respiratory complications will improve Outcome: Progressing Note: Encouraging pulmonary toileting, congested cough, good effort, titrating necessary oxygen level down Goal: Cardiovascular complication will be avoided Outcome: Progressing   Problem: Activity: Goal: Risk for activity intolerance will decrease Outcome: Progressing   Problem: Nutrition: Goal: Adequate nutrition will be maintained Outcome: Progressing   Problem: Coping: Goal: Level of anxiety will decrease Outcome: Progressing   Problem: Elimination: Goal: Will not experience complications related to bowel motility Outcome: Not Progressing Note: Passing flatus, medications for return of bowel function given today Goal: Will not experience complications related to urinary retention Outcome: Progressing Note: Pt started on medications for BPH, will keep Foley until tomorrow   Problem: Pain Managment: Goal: General experience of comfort will improve Outcome: Progressing   Problem: Safety: Goal: Ability to remain free from injury will improve Outcome: Progressing   Problem: Skin Integrity: Goal: Risk for impaired skin integrity will decrease Outcome: Progressing   Problem: Activity: Goal: Risk for activity intolerance will decrease Outcome: Progressing   Problem: Cardiac: Goal: Will achieve and/or maintain  hemodynamic stability Outcome: Progressing   Problem: Clinical Measurements: Goal: Postoperative complications will be avoided or minimized Outcome: Progressing   Problem: Respiratory: Goal: Respiratory status will improve Outcome: Progressing   Problem: Skin Integrity: Goal: Wound healing without signs and symptoms of infection Outcome: Progressing Goal: Risk for impaired skin integrity will decrease Outcome: Progressing   Problem: Activity: Goal: Risk for activity intolerance will decrease Outcome: Progressing   Problem: Cardiac: Goal: Will achieve and/or maintain hemodynamic stability Outcome: Progressing   Problem: Clinical Measurements: Goal: Postoperative complications will be avoided or minimized Outcome: Progressing   Problem: Respiratory: Goal: Respiratory status will improve Outcome: Progressing   Problem: Skin Integrity: Goal: Wound healing without signs and symptoms of infection Outcome: Progressing Goal: Risk for impaired skin integrity will decrease Outcome: Progressing

## 2020-05-30 NOTE — Progress Notes (Signed)
Patient ID: Clayton Powell, male   DOB: 03-Jun-1946, 74 y.o.   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 (N/A) TRANSESOPHAGEAL ECHOCARDIOGRAM (TEE) (N/A) ENDOVEIN HARVEST OF GREATER SAPHENOUS VEIN (Right)  Total Length of Stay:  LOS: 3 days  BP 129/72   Pulse 72   Temp 98.1 F (36.7 C)   Resp (!) 22   Ht 5\' 6"  (1.676 m)   Wt 93.3 kg   SpO2 95%   BMI 33.20 kg/m   .Intake/Output      11/05 0701 - 11/06 0700 11/06 0701 - 11/07 0700   P.O. 120 360   I.V. (mL/kg) 2606.5 (27.9) 235.9 (2.5)   Blood 850    IV Piggyback 1232.7 128.6   Total Intake(mL/kg) 4809.2 (51.5) 724.5 (7.8)   Urine (mL/kg/hr) 1635 (0.7) 270 (0.2)   Emesis/NG output 0    Chest Tube 376 40   Total Output 2011 310   Net +2798.2 +414.5          . sodium chloride Stopped (05/30/20 1034)  . sodium chloride    . sodium chloride 10 mL/hr at 05/30/20 1622  . cefUROXime (ZINACEF)  IV Stopped (05/30/20 1656)  . dexmedetomidine (PRECEDEX) IV infusion Stopped (05/29/20 1602)  . lactated ringers    . lactated ringers Stopped (05/30/20 1036)  . nitroGLYCERIN Stopped (05/30/20 0443)  . phenylephrine (NEO-SYNEPHRINE) Adult infusion Stopped (05/29/20 1341)     Lab Results  Component Value Date   WBC 10.2 05/30/2020   HGB 11.5 (L) 05/30/2020   HCT 35.3 (L) 05/30/2020   PLT 132 (L) 05/30/2020   GLUCOSE 148 (H) 05/30/2020   CHOL 143 05/26/2020   TRIG 141 05/26/2020   HDL 52 05/26/2020   LDLCALC 63 05/26/2020   ALT 29 05/25/2020   AST 34 05/25/2020   NA 137 05/30/2020   K 4.1 05/30/2020   CL 106 05/30/2020   CREATININE 0.97 05/30/2020   BUN 18 05/30/2020   CO2 24 05/30/2020   TSH 2.478 02/05/2003   PSA 4.0 12/26/2005   INR 1.4 (H)  05/29/2020   HGBA1C 5.4 05/28/2020   sinus Feels better with tubes out    Grace Isaac MD  Beeper [phone] Office [phone] 05/30/2020 6:44 PM

## 2020-05-31 ENCOUNTER — Inpatient Hospital Stay (HOSPITAL_COMMUNITY): Payer: 59

## 2020-05-31 ENCOUNTER — Encounter (HOSPITAL_COMMUNITY): Payer: Self-pay | Admitting: Cardiothoracic Surgery

## 2020-05-31 LAB — GLUCOSE, CAPILLARY
Glucose-Capillary: 118 mg/dL — ABNORMAL HIGH (ref 70–99)
Glucose-Capillary: 119 mg/dL — ABNORMAL HIGH (ref 70–99)
Glucose-Capillary: 126 mg/dL — ABNORMAL HIGH (ref 70–99)
Glucose-Capillary: 129 mg/dL — ABNORMAL HIGH (ref 70–99)
Glucose-Capillary: 134 mg/dL — ABNORMAL HIGH (ref 70–99)
Glucose-Capillary: 99 mg/dL (ref 70–99)

## 2020-05-31 LAB — BASIC METABOLIC PANEL
Anion gap: 7 (ref 5–15)
BUN: 21 mg/dL (ref 8–23)
CO2: 24 mmol/L (ref 22–32)
Calcium: 8.1 mg/dL — ABNORMAL LOW (ref 8.9–10.3)
Chloride: 105 mmol/L (ref 98–111)
Creatinine, Ser: 0.98 mg/dL (ref 0.61–1.24)
GFR, Estimated: >60 mL/min (ref >60)
Glucose, Bld: 115 mg/dL — ABNORMAL HIGH (ref 70–99)
Potassium: 4.1 mmol/L (ref 3.5–5.1)
Sodium: 136 mmol/L (ref 135–145)

## 2020-05-31 LAB — CBC
HCT: 31.4 % — ABNORMAL LOW (ref 39.0–52.0)
Hemoglobin: 10.1 g/dL — ABNORMAL LOW (ref 13.0–17.0)
MCH: 31.5 pg (ref 26.0–34.0)
MCHC: 32.2 g/dL (ref 30.0–36.0)
MCV: 97.8 fL (ref 80.0–100.0)
Platelets: 117 10*3/uL — ABNORMAL LOW (ref 150–400)
RBC: 3.21 MIL/uL — ABNORMAL LOW (ref 4.22–5.81)
RDW: 12.9 % (ref 11.5–15.5)
WBC: 9.5 10*3/uL (ref 4.0–10.5)
nRBC: 0 % (ref 0.0–0.2)

## 2020-05-31 MED ORDER — ~~LOC~~ CARDIAC SURGERY, PATIENT & FAMILY EDUCATION: Freq: Once | Status: AC

## 2020-05-31 MED ORDER — GUAIFENESIN ER 600 MG PO TB12
600.0000 mg | ORAL_TABLET | Freq: Two times a day (BID) | ORAL | Status: DC | PRN
  Administered 2020-05-31 – 2020-06-07 (×4): 600 mg via ORAL
  Filled 2020-05-31 (×4): qty 1

## 2020-05-31 MED ORDER — TRAMADOL HCL 50 MG PO TABS
50.0000 mg | ORAL_TABLET | ORAL | Status: DC | PRN
  Administered 2020-06-01 – 2020-06-02 (×2): 100 mg via ORAL
  Administered 2020-06-02 – 2020-06-03 (×2): 50 mg via ORAL
  Administered 2020-06-05 (×2): 100 mg via ORAL
  Filled 2020-05-31: qty 1
  Filled 2020-05-31 (×4): qty 2
  Filled 2020-05-31: qty 1
  Filled 2020-05-31: qty 2

## 2020-05-31 MED ORDER — ONDANSETRON HCL 4 MG/2ML IJ SOLN: 4.0000 mg | Freq: Four times a day (QID) | INTRAMUSCULAR | Status: DC | PRN

## 2020-05-31 MED ORDER — BISACODYL 5 MG PO TBEC
10.0000 mg | DELAYED_RELEASE_TABLET | Freq: Every day | ORAL | Status: DC | PRN
  Filled 2020-05-31: qty 2

## 2020-05-31 MED ORDER — BISACODYL 10 MG RE SUPP
10.0000 mg | Freq: Every day | RECTAL | Status: DC | PRN
  Administered 2020-06-02: 10 mg via RECTAL
  Filled 2020-05-31: qty 1

## 2020-05-31 MED ORDER — SODIUM CHLORIDE 0.9% FLUSH: 3.0000 mL | INTRAVENOUS | Status: DC | PRN

## 2020-05-31 MED ORDER — PANTOPRAZOLE SODIUM 40 MG PO TBEC
40.0000 mg | DELAYED_RELEASE_TABLET | Freq: Every day | ORAL | Status: DC
  Administered 2020-06-01 – 2020-06-07 (×7): 40 mg via ORAL
  Filled 2020-05-31 (×7): qty 1

## 2020-05-31 MED ORDER — ONDANSETRON HCL 4 MG PO TABS: 4.0000 mg | ORAL_TABLET | Freq: Four times a day (QID) | ORAL | Status: DC | PRN

## 2020-05-31 MED ORDER — SODIUM CHLORIDE 0.9% FLUSH
3.0000 mL | Freq: Two times a day (BID) | INTRAVENOUS | Status: DC
  Administered 2020-05-31 – 2020-06-07 (×14): 3 mL via INTRAVENOUS

## 2020-05-31 MED ORDER — SODIUM CHLORIDE 0.9 % IV SOLN: 250.0000 mL | INTRAVENOUS | Status: DC | PRN

## 2020-05-31 MED ORDER — ALUM & MAG HYDROXIDE-SIMETH 200-200-20 MG/5ML PO SUSP: 15.0000 mL | ORAL | Status: DC | PRN

## 2020-05-31 MED ORDER — INSULIN ASPART 100 UNIT/ML ~~LOC~~ SOLN
0.0000 [IU] | Freq: Three times a day (TID) | SUBCUTANEOUS | Status: DC
  Administered 2020-05-31: 2 [IU] via SUBCUTANEOUS
  Administered 2020-06-01: 4 [IU] via SUBCUTANEOUS
  Administered 2020-06-03 – 2020-06-04 (×2): 2 [IU] via SUBCUTANEOUS

## 2020-05-31 MED ORDER — MAGNESIUM HYDROXIDE 400 MG/5ML PO SUSP: 30.0000 mL | Freq: Every day | ORAL | Status: DC | PRN

## 2020-05-31 MED ORDER — ACETAMINOPHEN 325 MG PO TABS
650.0000 mg | ORAL_TABLET | Freq: Four times a day (QID) | ORAL | Status: DC | PRN
  Administered 2020-05-31 – 2020-06-07 (×8): 650 mg via ORAL
  Filled 2020-05-31 (×9): qty 2

## 2020-05-31 MED ORDER — METOPROLOL TARTRATE 12.5 MG HALF TABLET
12.5000 mg | ORAL_TABLET | Freq: Two times a day (BID) | ORAL | Status: DC
  Administered 2020-05-31 – 2020-06-05 (×11): 12.5 mg via ORAL
  Filled 2020-05-31 (×11): qty 1

## 2020-05-31 MED ORDER — ASPIRIN EC 325 MG PO TBEC
325.0000 mg | DELAYED_RELEASE_TABLET | Freq: Every day | ORAL | Status: DC
  Administered 2020-05-31 – 2020-06-07 (×8): 325 mg via ORAL
  Filled 2020-05-31 (×8): qty 1

## 2020-05-31 MED ORDER — DOCUSATE SODIUM 100 MG PO CAPS
200.0000 mg | ORAL_CAPSULE | Freq: Every day | ORAL | Status: DC
  Administered 2020-05-31 – 2020-06-06 (×7): 200 mg via ORAL
  Filled 2020-05-31 (×8): qty 2

## 2020-05-31 NOTE — Progress Notes (Signed)
Arrived from [facility] in [facility].   CHG completed.  Vitals signs taken.  CCMD notified portable box # [id number] Oriented to room.  Wife at bedside

## 2020-05-31 NOTE — Progress Notes (Signed)
Patient ID: Clayton Powell, male   DOB: [birth year], 74 y.o.   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 (N/A) TRANSESOPHAGEAL ECHOCARDIOGRAM (TEE) (N/A) ENDOVEIN HARVEST OF GREATER SAPHENOUS VEIN (Right)  Total Length of Stay:  LOS: 4 days   Subjective: Patient awake alert neurologically intact, sitting in chair.   Objective: Vital signs in last 24 hours: Temp:  [97.7 F (36.5 C)-99.3 F (37.4 C)] 98.7 F (37.1 C) (11/07 0809) Pulse Rate:  [58-81] 66 (11/07 0800) Cardiac Rhythm: Normal sinus rhythm (11/06 2000) Resp:  [0-28] 17 (11/07 0800) BP: (95-161)/(59-118) 95/66 (11/07 0800) SpO2:  [76 %-100 %] 94 % (11/07 0800) Arterial Line BP: (87)/(73) 87/73 (11/06 0900) Weight:  [92.4 kg] 92.4 kg (11/07 0600)  Filed Weights   05/29/20 0530 05/30/20 0500 05/31/20 0600  Weight: 89.4 kg 93.3 kg 92.4 kg    Weight change: -0.902 kg   Hemodynamic parameters for last 24 hours: PAP: (26-27)/(8-12) 26/8  Intake/Output from previous day: 11/06 0701 - 11/07 0700 In: 824.5 [P.O.:360; I.V.:235.9; IV Piggyback:228.6] Out: 2045 [Urine:2005; Chest Tube:40]  Intake/Output this shift: Total I/O In: 100 [P.O.:100] Out: -   Current Meds: Scheduled Meds: . acetaminophen  1,000 mg Oral Q6H   Or  . acetaminophen (TYLENOL) oral liquid 160 mg/5 mL  1,000 mg Per Tube Q6H  . alfuzosin  10 mg Oral QHS  . aspirin EC  325 mg Oral Daily   Or  . aspirin  324 mg Per Tube Daily  . atorvastatin  80 mg Oral q1800  . bisacodyl  10 mg Oral Daily   Or  . bisacodyl  10 mg Rectal Daily  . chlorhexidine  15 mL Mouth Rinse BID  . Chlorhexidine Gluconate Cloth  6 each Topical Daily  . docusate sodium  200 mg Oral  Daily  . enoxaparin (LOVENOX) injection  40 mg Subcutaneous QHS  . finasteride  5 mg Oral QHS  . icosapent Ethyl  2 g Oral BID  . insulin aspart  0-24 Units Subcutaneous Q4H  . mouth rinse  15 mL Mouth Rinse q12n4p  . metoprolol tartrate  12.5 mg Oral BID   Or  . metoprolol tartrate  12.5 mg Per Tube BID  . pantoprazole  40 mg Oral Daily  . sodium chloride flush  10-40 mL Intracatheter Q12H  . sodium chloride flush  3 mL Intravenous Q12H   Continuous Infusions: . sodium chloride Stopped (05/30/20 1034)  . sodium chloride    . sodium chloride 10 mL/hr at 05/30/20 1622  . dexmedetomidine (PRECEDEX) IV infusion Stopped (05/29/20 1602)  . lactated ringers    . lactated ringers Stopped (05/30/20 1036)  . nitroGLYCERIN Stopped (05/30/20 0443)  . phenylephrine (NEO-SYNEPHRINE) Adult infusion Stopped (05/29/20 1341)   PRN Meds:.sodium chloride, lactated ringers, metoprolol tartrate, midazolam, morphine injection, ondansetron (ZOFRAN) IV, oxyCODONE, sodium chloride flush, sodium chloride flush, traMADol  General appearance: alert, cooperative and no distress Neurologic: intact Heart: regular rate and rhythm, S1, S2 normal, no murmur, click, rub or gallop Lungs: clear to auscultation bilaterally Abdomen: soft, non-tender;  bowel sounds normal; no masses,  no organomegaly Extremities: extremities normal, atraumatic, no cyanosis or edema and Homans sign is negative, no sign of DVT Wound: Sternum intact dressing in place Some blood-tinged urine after pulling on catheter last night  Lab Results: CBC: Recent Labs    05/30/20 1710 05/31/20 0401  WBC 10.2 9.5  HGB 11.5* 10.1*  HCT 35.3* 31.4*  PLT 132* 117*   BMET:  Recent Labs    05/30/20 1710 05/31/20 0401  NA 137 136  K 4.1 4.1  CL 106 105  CO2 24 24  GLUCOSE 148* 115*  BUN 18 21  CREATININE 0.97 0.98  CALCIUM 8.2* 8.1*    CMET: Lab Results  Component Value Date   WBC 9.5 05/31/2020   HGB 10.1 (L) 05/31/2020   HCT  31.4 (L) 05/31/2020   PLT 117 (L) 05/31/2020   GLUCOSE 115 (H) 05/31/2020   CHOL 143 05/26/2020   TRIG 141 05/26/2020   HDL 52 05/26/2020   LDLCALC 63 05/26/2020   ALT 29 05/25/2020   AST 34 05/25/2020   NA 136 05/31/2020   K 4.1 05/31/2020   CL 105 05/31/2020   CREATININE 0.98 05/31/2020   BUN 21 05/31/2020   CO2 24 05/31/2020   TSH 2.478 02/05/2003   PSA 4.0 12/26/2005   INR 1.4 (H) 05/29/2020   HGBA1C 5.4 05/28/2020      PT/INR:  Recent Labs    05/29/20 1412  LABPROT 16.5*  INR 1.4*   Radiology: No results found.   Assessment/Plan: S/P Procedure(s) (LRB): CORONARY ARTERY BYPASS GRAFTING (CABG) TIMES THREE USING LIMA to LAD; ENDOSCOPIC HARVESTED RIGHT GREATER SAPHENOUS VEIN: SVG to PD; SVG to RAMUS. (N/A) TRANSESOPHAGEAL ECHOCARDIOGRAM (TEE) (N/A) ENDOVEIN HARVEST OF GREATER SAPHENOUS VEIN (Right) Mobilize Diuresis d/c tubes/lines Plan for transfer to step-down: see transfer orders Mild hematuria secondary to catheter trauma, renal function good urine output good  Grace Isaac 05/31/2020 8:32 AM

## 2020-06-01 ENCOUNTER — Inpatient Hospital Stay (HOSPITAL_COMMUNITY): Payer: 59

## 2020-06-01 LAB — BASIC METABOLIC PANEL
Anion gap: 7 (ref 5–15)
BUN: 21 mg/dL (ref 8–23)
CO2: 24 mmol/L (ref 22–32)
Calcium: 8.3 mg/dL — ABNORMAL LOW (ref 8.9–10.3)
Chloride: 106 mmol/L (ref 98–111)
Creatinine, Ser: 0.84 mg/dL (ref 0.61–1.24)
GFR, Estimated: >60 mL/min (ref >60)
Glucose, Bld: 127 mg/dL — ABNORMAL HIGH (ref 70–99)
Potassium: 4.3 mmol/L (ref 3.5–5.1)
Sodium: 137 mmol/L (ref 135–145)

## 2020-06-01 LAB — CBC
HCT: 32.9 % — ABNORMAL LOW (ref 39.0–52.0)
Hemoglobin: 10.8 g/dL — ABNORMAL LOW (ref 13.0–17.0)
MCH: 31.1 pg (ref 26.0–34.0)
MCHC: 32.8 g/dL (ref 30.0–36.0)
MCV: 94.8 fL (ref 80.0–100.0)
Platelets: 141 10*3/uL — ABNORMAL LOW (ref 150–400)
RBC: 3.47 MIL/uL — ABNORMAL LOW (ref 4.22–5.81)
RDW: 13.1 % (ref 11.5–15.5)
WBC: 9.5 10*3/uL (ref 4.0–10.5)
nRBC: 0 % (ref 0.0–0.2)

## 2020-06-01 LAB — GLUCOSE, CAPILLARY
Glucose-Capillary: 111 mg/dL — ABNORMAL HIGH (ref 70–99)
Glucose-Capillary: 107 mg/dL — ABNORMAL HIGH (ref 70–99)
Glucose-Capillary: 162 mg/dL — ABNORMAL HIGH (ref 70–99)

## 2020-06-01 LAB — SURGICAL PATHOLOGY

## 2020-06-01 MED ORDER — CHLORHEXIDINE GLUCONATE CLOTH 2 % EX PADS
6.0000 | MEDICATED_PAD | Freq: Every day | CUTANEOUS | Status: DC
  Administered 2020-06-01 – 2020-06-07 (×7): 6 via TOPICAL

## 2020-06-01 MED ORDER — AMIODARONE IV BOLUS ONLY 150 MG/100ML
150.0000 mg | Freq: Once | INTRAVENOUS | Status: AC
  Administered 2020-06-01: 150 mg via INTRAVENOUS
  Filled 2020-06-01: qty 100

## 2020-06-01 MED ORDER — AMIODARONE LOAD VIA INFUSION
150.0000 mg | Freq: Once | INTRAVENOUS | Status: DC
  Filled 2020-06-01 (×2): qty 83.34

## 2020-06-01 MED ORDER — AMIODARONE HCL 200 MG PO TABS
400.0000 mg | ORAL_TABLET | Freq: Once | ORAL | Status: AC
  Administered 2020-06-01: 400 mg via ORAL
  Filled 2020-06-01: qty 2

## 2020-06-01 MED ORDER — AMIODARONE HCL 200 MG PO TABS
400.0000 mg | ORAL_TABLET | Freq: Two times a day (BID) | ORAL | Status: DC
  Administered 2020-06-02 – 2020-06-05 (×7): 400 mg via ORAL
  Filled 2020-06-01 (×7): qty 2

## 2020-06-01 MED FILL — Magnesium Sulfate Inj 50%: INTRAMUSCULAR | Qty: 20 | Status: CN

## 2020-06-01 MED FILL — Heparin Sodium (Porcine) Inj 1000 Unit/ML: INTRAMUSCULAR | Qty: 30 | Status: CN

## 2020-06-01 MED FILL — Magnesium Sulfate Inj 50%: INTRAMUSCULAR | Qty: 10 | Status: CN

## 2020-06-01 MED FILL — Potassium Chloride Inj 2 mEq/ML: INTRAVENOUS | Qty: 40 | Status: AC

## 2020-06-01 MED FILL — Heparin Sodium (Porcine) Inj 1000 Unit/ML: INTRAMUSCULAR | Qty: 30 | Status: AC

## 2020-06-01 MED FILL — Potassium Chloride Inj 2 mEq/ML: INTRAVENOUS | Qty: 40 | Status: CN

## 2020-06-01 MED FILL — Magnesium Sulfate Inj 50%: INTRAMUSCULAR | Qty: 10 | Status: AC

## 2020-06-01 NOTE — Progress Notes (Addendum)
[address]       [place] [address]             [phone]      3 Days Post-Op Procedure(s) (LRB): CORONARY ARTERY BYPASS GRAFTING (CABG) TIMES THREE USING LIMA to LAD; ENDOSCOPIC HARVESTED RIGHT GREATER SAPHENOUS VEIN: SVG to PD; SVG to RAMUS. (N/A) TRANSESOPHAGEAL ECHOCARDIOGRAM (TEE) (N/A) ENDOVEIN HARVEST OF GREATER SAPHENOUS VEIN (Right) Subjective:  uncomfortable from not being able to void  Objective: Vital signs in last 24 hours: Temp:  [97.9 F (36.6 C)-98.7 F (37.1 C)] 98.4 F (36.9 C) (11/08 0400) Pulse Rate:  [65-73] 66 (11/08 0400) Cardiac Rhythm: Normal sinus rhythm (11/07 1942) Resp:  [17-24] 21 (11/08 0400) BP: (95-133)/(59-89) 117/63 (11/08 0400) SpO2:  [91 %-99 %] 96 % (11/08 0400) Weight:  [89.8 kg-92.8 kg] 92.8 kg (11/08 0500)  Hemodynamic parameters for last 24 hours:    Intake/Output from previous day: 11/07 0701 - 11/08 0700 In: 200 [P.O.:200] Out: 650 [Urine:650] Intake/Output this shift: No intake/output data recorded.  General appearance: alert, cooperative and no distress Heart: regular rate and rhythm Lungs: mildl dim l>R base, o/w clear Abdomen: benign Extremities: no edema Wound: chest dressing CDI, evh site looks good  Lab Results: Recent Labs    05/31/20 0401 06/01/20 0221  WBC 9.5 9.5  HGB 10.1* 10.8*  HCT 31.4* 32.9*  PLT 117* 141*   BMET:  Recent Labs    05/31/20 0401 06/01/20 0221  NA 136 137  K 4.1 4.3  CL 105 106  CO2 24 24  GLUCOSE 115* 127*  BUN 21 21  CREATININE 0.98 0.84  CALCIUM 8.1* 8.3*    PT/INR:  Recent Labs    05/29/20 1412  LABPROT 16.5*  INR 1.4*   ABG    Component Value Date/Time   PHART 7.356 05/29/2020 1802   HCO3 19.9 (L) 05/29/2020 1802   TCO2 21 (L) 05/29/2020 1802   ACIDBASEDEF 5.0 (H) 05/29/2020 1802   O2SAT 97.0 05/29/2020 1802   CBG (last 3)  Recent Labs    05/31/20 1749 05/31/20 2057 06/01/20 0638  GLUCAP 134* 129* 111*     Meds Scheduled Meds: . alfuzosin  10 mg Oral QHS  . aspirin EC  325 mg Oral Daily  . atorvastatin  80 mg Oral q1800  . chlorhexidine  15 mL Mouth Rinse BID  . docusate sodium  200 mg Oral Daily  . enoxaparin (LOVENOX) injection  40 mg Subcutaneous QHS  . finasteride  5 mg Oral QHS  . icosapent Ethyl  2 g Oral BID  . insulin aspart  0-24 Units Subcutaneous TID AC & HS  . mouth rinse  15 mL Mouth Rinse q12n4p  . metoprolol tartrate  12.5 mg Oral BID  . pantoprazole  40 mg Oral QAC breakfast  . sodium chloride flush  3 mL Intravenous Q12H   Continuous Infusions: . sodium chloride     PRN Meds:.sodium chloride, acetaminophen, alum & mag hydroxide-simeth, bisacodyl **OR** bisacodyl, guaiFENesin, magnesium hydroxide, ondansetron **OR** ondansetron (ZOFRAN) IV, sodium chloride flush, traMADol  Xrays DG Chest Port 1 View  Result Date: 05/31/2020 CLINICAL DATA:  Postop EXAM: PORTABLE CHEST 1 VIEW COMPARISON:  Portable exam 0516 hours compared to 05/30/2020 FINDINGS: Interval removal of LEFT thoracostomy tube, mediastinal drain, and Swan-Ganz catheter. RIGHT jugular catheter with tip projecting over SVC. Upper normal heart size post CABG. Mediastinal contours and pulmonary vascularity normal. Bibasilar atelectasis. Lungs otherwise clear. No acute infiltrate, pleural  effusion, or pneumothorax. IMPRESSION: Bibasilar atelectasis. Electronically Signed   By: Lavonia Dana M.D.   On: 05/31/2020 12:31    Assessment/Plan: S/P Procedure(s) (LRB): CORONARY ARTERY BYPASS GRAFTING (CABG) TIMES THREE USING LIMA to LAD; ENDOSCOPIC HARVESTED RIGHT GREATER SAPHENOUS VEIN: SVG to PD; SVG to RAMUS. (N/A) TRANSESOPHAGEAL ECHOCARDIOGRAM (TEE) (N/A) ENDOVEIN HARVEST OF GREATER SAPHENOUS VEIN (Right)  POD#3 1 afeb , VSS 2 sats good on # liters 3 sinus rhythm 4 UOP- not fully recorded, notmal renal fxn 5 minor expected acute blood loss anemia- stable 7 no leukocytosis 8 thrombocytopenia improving trend 9  BS well controlled- not a diabetic 10 some urinary retention from prostate issues/foley, had to be I/O cath x 1, hopefully can avoid foley, on uroxatrol/proscar     LOS: 5 days    [name] PA-C Pager [phone] 06/01/2020  Trouble with urination this am, I/o cath x1 this am 400 ml I have seen and examined [name] and agree with the above assessment  and plan.  Grace Isaac MD Beeper [phone] Office [phone] 06/01/2020 10:07 AM

## 2020-06-01 NOTE — Discharge Summary (Signed)
Physician Discharge Summary  Patient ID: Clayton Powell MRN: [id number] DOB/AGE: [id number] 74 y.o.  Admit date: 05/27/2020 Discharge date: 06/07/2020  Admission Diagnoses:  Discharge Diagnoses:  Active Problems:   CAD (coronary artery disease)   S/P CABG x 3  Patient Active Problem List   Diagnosis Date Noted  . 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  . Unstable angina ([place]) 05/27/2020  . CAD (coronary artery disease) 05/27/2020  . Angina pectoris ([place]) 05/26/2020  . Hand numbness 07/15/2018  . Impacted cerumen of both ears 04/23/2018  . Acute gastroenteritis 04/12/2017  . Left knee pain 09/13/2016  . Tympanic membrane perforation, marginal, right 09/02/2016  . 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 Ear fullness, bilateral 05/24/2016  . 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 Dysfunction of both eustachian tubes 12/02/2015  . ETD (eustachian tube dysfunction) 09/24/2015  . Adjustment disorder 10/27/2014  . Advance care planning 10/26/2014  . Right rotator cuff tear 05/24/2013  . Benign prostatic hyperplasia with urinary obstruction 05/18/2012  . Abnormal prostate specific antigen 05/16/2012  . 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 Incomplete bladder emptying 05/16/2012  . Murmur 10/21/2011  . Routine general medical examination at a health care facility 08/12/2011  . Left rotator cuff tear 08/12/2011  . RAYNAUD'S SYNDROME 08/10/2010  . CAD, NATIVE VESSEL 03/16/2009  . UNSPECIFIED HEART DISEASE 03/12/2009  . METABOLIC SYNDROME X 73/41/9379  . GERD 02/18/2007  . ALLERGIC RHINITIS, CHRONIC 02/16/2007  . PROSTATITIS, CHRONIC 02/16/2007  . HTN (hypertension) 2002  . BPH (benign prostatic hyperplasia) 2002    History of Present Illness:     Patient noted new onset of epigastric discomfort over the past several weeks.  First noted as he was walking across the parking lot to work.  Since he first noticed it, increasingly obvious.  Earlier this week he  called his primary care doctor describing the symptoms and was referred to urgent care.  Ultimately he was admitted to [facility] troponin was very minimally elevated.  Yesterday he underwent cardiac catheterization by Dr. Nehemiah Massed, this revealed significant three-vessel disease.  Today he was transferredto [facility] for consideration of coronary artery bypass grafting.   He has a pastmedical history for of coronary disease is based on coronary calcium score markedly elevated in 2012.  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   he has known history of dyslipidemia specifically elevated triglycerides. , hypertension, and gastroesophageal reflux.He did have a normal stress test in 2010.   He presented to the ER with substernal chest pain that was noted to be worse with exertion. He was admitted with slightly elevated troponin levels for further evaluation and treatment. An echocardiogram showed normal LV ejection fraction of 60% with no evidence of significant valvular heart disease.   Patient was medically stabilized and transferred to [facility] for cardiothoracic surgical evaluation for consideration of coronary artery surgical revascularization.  He was seen in consultation by Dr. Servando Snare who evaluated the patient and his studies and recommended proceeding with CABG.  Discharged Condition: good  Hospital Course: The patient was transferred from [facility] for definitive surgical intervention.  On 05/29/2020 he was taken to the operating room where he underwent the below described procedure.  He tolerated it well was taken to the surgical intensive care unit in stable condition.  Postoperative hospital course:  Patient has done well.  He was extubated using standard cardiac surgical protocols without difficulty.  He has  remained neurologically intact.  He does have an expected acute blood loss anemia which is mild.   Renal function has remained within normal limits.  He has had some urinary retention in the early postoperative period and was resumed on his preoperative medications for BPH.  He was also seen by urology due to the fact that the Foley catheter had to be replaced.  He developed spasms in his urinary tract and was started on the B&O suppositories and his symptoms have improved. The Foley was eventually able to be removed and he does have a low-grade hematuria but has had no difficulty passing urine. He has urology follow-up this week. He developed postoperative atrial fibrillation but has subsequently been chemically cardioverted to sinus rhythm with amiodarone. He then developed symptomatic bradycardia and both beta-blocker and amiodarone have been discontinued. He is maintaining sinus rhythm in the 60s primarily for over 24 hours. He has had hypertension during the postoperative period and has been started on both lisinopril and Norvasc. He will need close attention as an outpatient to this. Incisions are noted to be healing well without evidence of infection. He is tolerating diet. He is tolerating routine activity commensurate for level of postoperative convalescence. Oxygen has been weaned and he maintains good saturations on room air. At the time of discharge she is felt to be quite stable.  Consults: urology  Significant Diagnostic Studies: routine post-op serial labs and CXR's  Treatments: surgery:  OPERATIVE REPORT  DATE OF PROCEDURE:  05/29/2020  PREOPERATIVE DIAGNOSIS:  Coronary occlusive disease with unstable angina.  POSTOPERATIVE DIAGNOSIS:  Coronary occlusive disease with unstable angina.  SURGICAL PROCEDURE:  Coronary artery bypass grafting x3 with the left internal mammary to the left anterior descending coronary artery, reverse saphenous vein graft to the ramus coronary artery, reverse saphenous vein graft to the distal right coronary  artery, with right greater saphenous  endoscopic vein harvesting, right thigh and calf, and incidental lymph node biopsy of internal mammary cleft lymph node.  SURGEON:  Lanelle Bal, MD  FIRST ASSISTANT:  Jadene Pierini, PA-C   Discharge Exam: Blood pressure (!) 151/80, pulse 65, temperature 98.3 F (36.8 C), temperature source Oral, resp. rate 17, height 5\' 6"  (1.676 m), weight 89.3 kg, SpO2 95 %.  General appearance: alert, cooperative and no distress Heart: regular rate and rhythm Lungs: clear to auscultation bilaterally Abdomen: benign Extremities: no edema Wound: incis healing well Disposition:  Discharge disposition: 01-Home or Self Care       Discharge Instructions    Amb Referral to Cardiac Rehabilitation   Complete by: As directed    Diagnosis: CABG   CABG X ___: 3   After initial evaluation and assessments completed: Virtual Based Care may be provided alone or in conjunction with Phase 2 Cardiac Rehab based on patient barriers.: Yes   Discharge patient   Complete by: As directed    Discharge disposition: 01-Home or Self Care   Discharge patient date: 06/07/2020     Allergies as of 06/07/2020      Reactions   Ciprofloxacin Hcl Rash   Quinolones Rash      Medication List    STOP taking these medications   metoprolol succinate 25 MG 24 hr tablet Commonly known as: TOPROL-XL   rosuvastatin 5 MG tablet Commonly known as: CRESTOR     TAKE these medications   alfuzosin 10 MG 24 hr tablet Commonly known as: UROXATRAL Take 1 tablet (10 mg total) by mouth daily with breakfast.   amLODipine  5 MG tablet Commonly known as: NORVASC Take 1 tablet (5 mg total) by mouth daily.   aspirin 325 MG EC tablet Take 1 tablet (325 mg total) by mouth daily. What changed:   medication strength  how much to take   atorvastatin 80 MG tablet Commonly known as: LIPITOR Take 1 tablet (80 mg total) by mouth daily at 6 PM.   Azelastine-Fluticasone 137-50 MCG/ACT Susp Place 1 spray into both nostrils 2  (two) times daily.   calcium citrate-vitamin D 315-200 MG-UNIT tablet Commonly known as: CITRACAL+D Take 1-2 tablets by mouth 2 (two) times daily.   clindamycin 1 % external solution Commonly known as: CLEOCIN T Apply topically 2 (two) times daily as needed.   finasteride 5 MG tablet Commonly known as: PROSCAR Take 1 tablet (5 mg total) by mouth daily.   guaiFENesin 600 MG 12 hr tablet Commonly known as: [employer] Mucus Extended Release Take 1 tablet (600 mg total) by mouth 2 (two) times daily.   lisinopril 20 MG tablet Commonly known as: ZESTRIL Take 1 tablet (20 mg total) by mouth daily.   multivitamin tablet Take 1 tablet by mouth daily.   opium-belladonna 16.2-60 MG suppository Commonly known as: B&O SUPPRETTES Place 1 suppository rectally every 8 (eight) hours as needed for bladder spasms.   traMADol 50 MG tablet Commonly known as: ULTRAM Take 1 tablet (50 mg total) by mouth every 6 (six) hours as needed for moderate pain.   Vascepa 1 g capsule Generic drug: icosapent Ethyl Take 1 tablet by mouth 4 (four) times daily.   Vitamin B-12 5000 MCG Subl Place 1 tablet under the tongue daily.   Vitamin D 50 MCG (2000 UT) Caps Take 1 capsule by mouth daily.       Follow-up Information    Corey Skains, MD Follow up.   Specialty: Cardiology Why: Appointment with Dr. Serafina Royals June 09, 2020 11:45 PM [facility] clinic, [address], [place], [place] [address] Contact information: [address] [facility] [place] [address] [phone]        Grace Isaac, MD Follow up.   Specialty: Cardiothoracic Surgery Why: Please see discharge paperwork for follow-up appointment with surgeon.  Please obtain a chest x-ray at [place] 1/2-hour prior to appointment.  It is located in the same office complex. Contact information: [place] [place] [address] [phone]              The patient has  been discharged on:   1.Beta Blocker:  Yes [ y  ]                              No   [   ]                              If No, reason:  2.Ace Inhibitor/ARB: Yes [ y  ]                                     No  [    ]                                     If No,  reason:  3.Statin:   Yes [ y  ]                  No  [   ]                  If No, reason:  4.Ecasa:  Yes  [ y  ]                  No   [   ]                  If No, reason:    Signed: [name] PA-C 06/07/2020, 7:53 AM

## 2020-06-01 NOTE — Progress Notes (Signed)
CARDIAC REHAB PHASE I   PRE:  Rate/Rhythm: 73 SR  BP:  Sitting: 113/69      SaO2: 98 3L  MODE:  Ambulation: 200 ft   POST:  Rate/Rhythm: 83 SR  BP:  Sitting: in BR    SaO2: 92 RA  Pt ambulated 241ft in hallway standby assist with front wheel walker. Pt denies pain, dizziness, or SOB. Pt returned to BR. BR call light within reach. Encouraged continued ambulation, IS use, and coughs. Will continue to follow.  [id number] Rufina Falco, RN BSN 06/01/2020 10:20 AM

## 2020-06-01 NOTE — Progress Notes (Addendum)
   06/01/20 1915  Assess: MEWS Score  Temp 98.1 F (36.7 C)  BP (!) 157/86  Pulse Rate (!) 117  ECG Heart Rate (!) 110  Resp (!) 25  Level of Consciousness Alert  SpO2 100 %  O2 Device Nasal Cannula  O2 Flow Rate (L/min) 2 L/min  Assess: MEWS Score  MEWS Temp 0  MEWS Systolic 0  MEWS Pulse 1  MEWS RR 1  MEWS LOC 0  MEWS Score 2  MEWS Score Color Yellow  Assess: if the MEWS score is Yellow or Red  Were vital signs taken at a resting state? Yes  Focused Assessment Change from prior assessment (see assessment flowsheet)  Early Detection of Sepsis Score *See Row Information* Low  MEWS guidelines implemented *See Row Information* Yes  Treat  MEWS Interventions Other (Comment) (placed on 2l O2)  Pain Score 2  Pain Type Surgical pain  Pain Location Chest  Pain Orientation Mid  Pain Intervention(s) Emotional support  Take Vital Signs  Increase Vital Sign Frequency  Yellow: Q 2hr X 2 then Q 4hr X 2, if remains yellow, continue Q 4hrs  Notify: Charge Nurse/RN  Name of Charge Nurse/RN Notified SHARON RN  Date Charge Nurse/RN Notified 06/01/20  Time Charge Nurse/RN Notified 2003  Notify: Provider  Provider Name/Title Byrd Hesselbach MD  Date Provider Notified 06/01/20  Time Provider Notified 1955  Notification Type Page  Notification Reason Change in status  Response See new orders  Date of Provider Response 06/01/20  Time of Provider Response 2000  patient went into A flutter, patient c/o SOB, denied chest pain or dizziness, placed on 2L nasal canula, Jadene Pierini PA paged, verbal order received for Amiodarone 150mg  IV bolus, amiodarone 400 mg now and amiodarone 400 mg BID. Will continue to monitor.

## 2020-06-01 NOTE — Progress Notes (Signed)
Mobility Specialist: Progress Note   06/01/20 1422  Mobility  Activity Ambulated in hall  Level of Assistance Modified independent, requires aide device or extra time  Assistive Device Front wheel walker  Distance Ambulated (ft) 400 ft  Mobility Response Tolerated well  Mobility performed by Family member   Pt seen ambulating in hallway with family member. Pt had no c/o. Will f/u as time allows.   [facility] Raziel Koenigs Mobility Specialist

## 2020-06-01 NOTE — Discharge Instructions (Signed)
[facility] office [phone]   Endoscopic Saphenous Vein Harvesting, Care After This sheet gives you information about how to care for yourself after your procedure. Your health care provider may also give you more specific instructions. If you have problems or questions, contact your health care provider. What can I expect after the procedure? After the procedure, it is common to have:  Pain.  Bruising.  Swelling.  Numbness. Follow these instructions at home: Incision care   Follow instructions from your health care provider about how to take care of your incisions. Make sure you: ? Wash your hands with soap and water before and after you change your bandages (dressings). If soap and water are not available, use hand sanitizer. ? Change your dressings as told by your health care provider. ? Leave stitches (sutures), skin glue, or adhesive strips in place. These skin closures may need to stay in place for 2 weeks or longer. If adhesive strip edges start to loosen and curl up, you may trim the loose edges. Do not remove adhesive strips completely unless your health care provider tells you to do that.  Check your incision areas every day for signs of infection. Check for: ? More redness, swelling, or pain. ? Fluid or blood. ? Warmth. ? Pus or a bad smell. Medicines  Take over-the-counter and prescription medicines only as told by your health care provider.  Ask your health care provider if the medicine prescribed to you requires you to avoid driving or using heavy machinery. General instructions  Raise (elevate) your legs above the level of your heart while you are sitting or lying down.  Avoid crossing your legs.  Avoid sitting for long periods of time. Change positions every 30 minutes.  Do any exercises your health care providers have given you. These may include deep breathing, coughing, and walking exercises.  Do not take baths, swim, or use a hot tub until your health care  provider approves. Ask your health care provider if you may take showers. You may only be allowed to take sponge baths.  Wear compression stockings as told by your health care provider. These stockings help to prevent blood clots and reduce swelling in your legs.  Keep all follow-up visits as told by your health care provider. This is important. Contact a health care provider if:  Medicine does not help your pain.  Your pain gets worse.  You have new leg bruises or your leg bruises get bigger.  Your leg feels numb.  You have more redness, swelling, or pain around your incision.  You have fluid or blood coming from your incision.  Your incision feels warm to the touch.  You have pus or a bad smell coming from your incision.  You have a fever. Get help right away if:  Your pain is severe.  You develop pain, tenderness, warmth, redness, or swelling in any part of your leg.  You have chest pain.  You have trouble breathing. Summary  Raise (elevate) your legs above the level of your heart while you are sitting or lying down.  Wear compression stockings as told by your health care provider.  Make sure you know which symptoms should prompt you to contact your health care provider.  Keep all follow-up visits as told by your health care provider. This information is not intended to replace advice given to you by your health care provider. Make sure you discuss any questions you have with your health care provider. Document Revised: 06/18/2018 Document  Reviewed: 06/18/2018 Elsevier Patient Education  [place]. Coronary Artery Bypass Grafting, Care After This sheet gives you information about how to care for yourself after your procedure. Your doctor may also give you more specific instructions. If you have problems or questions, call your doctor. What can I expect after the procedure? After the procedure, it is common to:  Feel sick to your stomach (nauseous).  Not  want to eat as much as normal (lack of appetite).  Have trouble pooping (constipation).  Have weakness and tiredness (fatigue).  Feel sad (depressed) or grouchy (irritable).  Have pain or discomfort around the cuts from surgery (incisions). Follow these instructions at home: Medicines  Take over-the-counter and prescription medicines only as told by your doctor. Do not stop taking medicines or start any new medicines unless your doctor says it is okay.  If you were prescribed an antibiotic medicine, take it as told by your doctor. Do not stop taking the antibiotic even if you start to feel better. Incision care   Follow instructions from your doctor about how to take care of your cuts from surgery. Make sure you: ? Wash your hands with soap and water before and after you change your bandage (dressing). If you cannot use soap and water, use hand sanitizer. ? Change your bandage as told by your doctor. ? Leave stitches (sutures), skin glue, or skin tape (adhesive) strips in place. They may need to stay in place for 2 weeks or longer. If tape strips get loose and curl up, you may trim the loose edges. Do not remove tape strips completely unless your doctor says it is okay.  Make sure the surgery cuts are clean, dry, and protected.  Check your cut areas every day for signs of infection. Check for: ? More redness, swelling, or pain. ? More fluid or blood. ? Warmth. ? Pus or a bad smell.  If cuts were made in your legs: ? Avoid crossing your legs. ? Avoid sitting for long periods of time. Change positions every 30 minutes. ? Raise (elevate) your legs when you are sitting. Bathing  Do not take baths, swim, or use a hot tub until your doctor says it is okay.  You may shower . Pat the surgery cuts dry. Do not rub the cuts to dry. Eating and drinking   Eat foods that are high in fiber, such as beans, nuts, whole grains, and raw fruits and vegetables. Any meats you eat should be lean  cut. Avoid canned, processed, and fried foods. This can help prevent trouble pooping. This is also a part of a heart-healthy diet.  Drink enough fluid to keep your pee (urine) pale yellow.  Do not drink alcohol until you are fully recovered. Ask your doctor when it is safe to drink alcohol. Activity  Rest and limit your activity as told by your doctor. You may be told to: ? Stop any activity right away if you have chest pain, shortness of breath, irregular heartbeats, or dizziness. Get help right away if you have any of these symptoms. ? Move around often for short periods or take short walks as told by your doctor. Slowly increase your activities. ? Avoid lifting, pushing, or pulling anything that is heavier than 10 lb (4.5 kg) for at least 6 weeks or as told by your doctor.  Do physical therapy or a cardiac rehab (cardiac rehabilitation) program as told by your doctor. ? Physical therapy involves doing exercises to maintain movement and build strength  and endurance. ? A cardiac rehab program includes:  Exercise training.  Education.  Counseling.  Do not drive until your doctor says it is okay.  Ask your doctor when you can go back to work.  Ask your doctor when you can be sexually active. General instructions  Do not drive or use heavy machinery while taking prescription pain medicine.  Do not use any products that contain nicotine or tobacco. These include cigarettes, e-cigarettes, and chewing tobacco. If you need help quitting, ask your doctor.  Take 2-3 deep breaths every few hours during the day while you get better. This helps expand your lungs and prevent problems.  If you were given a device called an incentive spirometer, use it several times a day to practice deep breathing. Support your chest with a pillow or your arms when you take deep breaths or cough.  Wear compression stockings as told by your doctor.  Weigh yourself every day. This helps to see if your body is  holding (retaining) fluid that may make your heart and lungs work harder.  Keep all follow-up visits as told by your doctor. This is important. Contact a doctor if:  You have more redness, swelling, or pain around any cut.  You have more fluid or blood coming from any cut.  Any cut feels warm to the touch.  You have pus or a bad smell coming from any cut.  You have a fever.  You have swelling in your ankles or legs.  You have pain in your legs.  You gain 2 lb (0.9 kg) or more a day.  You feel sick to your stomach or you throw up (vomit).  You have watery poop (diarrhea). Get help right away if:  You have chest pain that goes to your jaw or arms.  You are short of breath.  You have a fast or irregular heartbeat.  You notice a "clicking" in your breastbone (sternum) when you move.  You have any signs of a stroke. "BE FAST" is an easy way to remember the main warning signs: ? B - Balance. Signs are dizziness, sudden trouble walking, or loss of balance. ? E - Eyes. Signs are trouble seeing or a change in how you see. ? F - Face. Signs are sudden weakness or loss of feeling of the face, or the face or eyelid drooping on one side. ? A - Arms. Signs are weakness or loss of feeling in an arm. This happens suddenly and usually on one side of the body. ? S - Speech. Signs are sudden trouble speaking, slurred speech, or trouble understanding what people say. ? T - Time. Time to call emergency services. Write down what time symptoms started.  You have other signs of a stroke, such as: ? A sudden, very bad headache with no known cause. ? Feeling sick to your stomach. ? Throwing up. ? Jerky movements you cannot control (seizure). These symptoms may be an emergency. Do not wait to see if the symptoms will go away. Get medical help right away. Call your local emergency services (911 in the [place] Do not drive yourself to the hospital. Summary  After the procedure, it is common to  have pain or discomfort in the cuts from surgery (incisions).  Do not take baths, swim, or use a hot tub until your doctor says it is okay.  Slowly increase your activities. You may need physical therapy or cardiac rehab.  Weigh yourself every day. This helps to see if  your body is holding fluid. This information is not intended to replace advice given to you by your health care provider. Make sure you discuss any questions you have with your health care provider. Document Revised: 03/20/2018 Document Reviewed: 03/20/2018 Elsevier Patient Education  2020 [employer].

## 2020-06-01 NOTE — Progress Notes (Signed)
CARDIAC REHAB PHASE I   Offered to walk with pt. Pt requesting some more time to rest since he had a rough night. Helped pt order breakfast. Will return after pt eats and encourage ambulation.  Rufina Falco, RN BSN 06/01/2020 8:29 AM

## 2020-06-02 LAB — BASIC METABOLIC PANEL
Anion gap: 9 (ref 5–15)
BUN: 18 mg/dL (ref 8–23)
CO2: 25 mmol/L (ref 22–32)
Calcium: 8.3 mg/dL — ABNORMAL LOW (ref 8.9–10.3)
Chloride: 106 mmol/L (ref 98–111)
Creatinine, Ser: 0.84 mg/dL (ref 0.61–1.24)
GFR, Estimated: >60 mL/min (ref >60)
Glucose, Bld: 102 mg/dL — ABNORMAL HIGH (ref 70–99)
Potassium: 3.9 mmol/L (ref 3.5–5.1)
Sodium: 140 mmol/L (ref 135–145)

## 2020-06-02 LAB — GLUCOSE, CAPILLARY
Glucose-Capillary: 102 mg/dL — ABNORMAL HIGH (ref 70–99)
Glucose-Capillary: 91 mg/dL (ref 70–99)
Glucose-Capillary: 107 mg/dL — ABNORMAL HIGH (ref 70–99)

## 2020-06-02 LAB — TSH: TSH: 4.344 u[IU]/mL (ref 0.350–4.500)

## 2020-06-02 LAB — MAGNESIUM: Magnesium: 1.8 mg/dL (ref 1.7–2.4)

## 2020-06-02 MED ORDER — OXYBUTYNIN CHLORIDE 5 MG PO TABS
5.0000 mg | ORAL_TABLET | Freq: Three times a day (TID) | ORAL | Status: DC
  Administered 2020-06-02 (×2): 5 mg via ORAL
  Filled 2020-06-02 (×3): qty 1

## 2020-06-02 MED ORDER — BELLADONNA ALKALOIDS-OPIUM 16.2-60 MG RE SUPP
1.0000 | Freq: Three times a day (TID) | RECTAL | Status: DC | PRN
  Administered 2020-06-03 – 2020-06-05 (×3): 1 via RECTAL
  Filled 2020-06-02 (×4): qty 1

## 2020-06-02 MED ORDER — MAGNESIUM OXIDE 400 (241.3 MG) MG PO TABS
400.0000 mg | ORAL_TABLET | Freq: Two times a day (BID) | ORAL | Status: DC
  Administered 2020-06-02 – 2020-06-07 (×11): 400 mg via ORAL
  Filled 2020-06-02 (×11): qty 1

## 2020-06-02 MED ORDER — AMIODARONE IV BOLUS ONLY 150 MG/100ML
150.0000 mg | Freq: Once | INTRAVENOUS | Status: AC
  Administered 2020-06-02: 150 mg via INTRAVENOUS
  Filled 2020-06-02: qty 100

## 2020-06-02 MED FILL — Sodium Chloride IV Soln 0.9%: INTRAVENOUS | Qty: 2000 | Status: AC

## 2020-06-02 MED FILL — Lidocaine HCl Local Soln Prefilled Syringe 100 MG/5ML (2%): INTRAMUSCULAR | Qty: 5 | Status: AC

## 2020-06-02 MED FILL — Heparin Sodium (Porcine) Inj 1000 Unit/ML: INTRAMUSCULAR | Qty: 20 | Status: AC

## 2020-06-02 MED FILL — Electrolyte-R (PH 7.4) Solution: INTRAVENOUS | Qty: 4000 | Status: AC

## 2020-06-02 MED FILL — Mannitol IV Soln 20%: INTRAVENOUS | Qty: 500 | Status: AC

## 2020-06-02 MED FILL — Sodium Bicarbonate IV Soln 8.4%: INTRAVENOUS | Qty: 50 | Status: AC

## 2020-06-02 NOTE — Progress Notes (Addendum)
Pt noted increases HR.  Patient noted pale, diaphoretic.   States he is dizzy & feels funny.  Patient stated he was "drifting away" PA notified.  New order Amiodarone 150 mg bolus. Vitals monitoring.

## 2020-06-02 NOTE — Progress Notes (Signed)
CARDIAC REHAB PHASE I   Returned to offer to walk with pt, RN asking to hold ambulation at this time. Pt currently receiving amio bolus. Will f/u tomorrow as appropriate.  Rufina Falco, RN BSN 06/02/2020 1:47 PM

## 2020-06-02 NOTE — Consult Note (Signed)
I have been asked to see the patient by Dr. Lanelle Bal, for evaluation and management of urinary retention and hematuria.  History of present illness: 74 year old man with a extensive history of BPH who was admitted and subsequently underwent a CABG surgery for unstable angina. He has recovered well from his procedure, and initially was able to void on his own after his catheter was removed postop. However, he did have some problems with urinary retention and elevated PVRs. He subsequently requested that the catheter be replaced. The patient recalls the catheter being tugged quite forcefully away from his body when he was being transferred out of the ICU. Since that time the patient has noted significant gross hematuria as well as severe bladder spasms. The catheter has been draining, and has not required any irrigation. The spasms however have been quite severe and very often.  The patient's past urologic history is significant for BPH. He has a very large prostate and has had voiding symptoms for quite some time. He has had problems with urinary retention when taking cough medicine with pseudoephedrine. He takes both alfuzosin and finasteride. He has been on these medications for a while. He recently was seen in [place] and at one time was scheduled for a holmium laser enucleation of the prostate. He sees Dr. Glori Luis.  Review of systems: A 12 point comprehensive review of systems was obtained and is negative unless otherwise stated in the history of present illness.  Patient Active Problem List   Diagnosis Date Noted  . 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  . Unstable angina ([place]) 05/27/2020  . CAD (coronary artery disease) 05/27/2020  . Angina pectoris ([place]) 05/26/2020  . Hand numbness 07/15/2018  . Impacted cerumen of both ears 04/23/2018  . Acute gastroenteritis 04/12/2017  . Left knee pain 09/13/2016  . Tympanic membrane perforation, marginal, right 09/02/2016  . 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 Ear  fullness, bilateral 05/24/2016  . 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 Dysfunction of both eustachian tubes 12/02/2015  . ETD (eustachian tube dysfunction) 09/24/2015  . Adjustment disorder 10/27/2014  . Advance care planning 10/26/2014  . Right rotator cuff tear 05/24/2013  . Benign prostatic hyperplasia with urinary obstruction 05/18/2012  . Abnormal prostate specific antigen 05/16/2012  . 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 Incomplete bladder emptying 05/16/2012  . Murmur 10/21/2011  . Routine general medical examination at a health care facility 08/12/2011  . Left rotator cuff tear 08/12/2011  . RAYNAUD'S SYNDROME 08/10/2010  . CAD, NATIVE VESSEL 03/16/2009  . UNSPECIFIED HEART DISEASE 03/12/2009  . METABOLIC SYNDROME X 93/71/6967  . GERD 02/18/2007  . ALLERGIC RHINITIS, CHRONIC 02/16/2007  . PROSTATITIS, CHRONIC 02/16/2007  . HTN (hypertension) 2002  . BPH (benign prostatic hyperplasia) 2002    No current facility-administered medications on file prior to encounter.   Current Outpatient Medications on File Prior to Encounter  Medication Sig Dispense Refill  . alfuzosin (UROXATRAL) 10 MG 24 hr tablet Take 1 tablet (10 mg total) by mouth daily with breakfast. 90 tablet 3  . aspirin 81 MG EC tablet Take 81 mg by mouth daily.      . Azelastine-Fluticasone 137-50 MCG/ACT SUSP Place 1 spray into both nostrils 2 (two) times daily.    . calcium citrate-vitamin D (CITRACAL+D) 315-200 MG-UNIT tablet Take 1-2 tablets by mouth 2 (two) times daily.    . Cholecalciferol (VITAMIN D) 2000 UNITS CAPS Take 1 capsule by mouth daily.      . clindamycin (  CLEOCIN T) 1 % external solution Apply topically 2 (two) times daily as needed.    . Cyanocobalamin (VITAMIN B-12) 5000 MCG SUBL Place 1 tablet under the tongue daily.    . finasteride (PROSCAR) 5 MG tablet Take 1 tablet (5 mg total) by mouth daily. 90 tablet 3  . Icosapent Ethyl (VASCEPA) 1 G  CAPS Take 1 tablet by mouth 4 (four) times daily.    . metoprolol succinate (TOPROL-XL) 25 MG 24 hr tablet Take 1 tablet (25 mg total) by mouth daily. 30 tablet 0  . Multiple Vitamin (MULTIVITAMIN) tablet Take 1 tablet by mouth daily.      . rosuvastatin (CRESTOR) 5 MG tablet Take 5 mg by mouth daily.      Past Medical History:  Diagnosis Date  . Allergy   . Anginal pain ([place])   . BPH (benign prostatic hypertrophy) 6/07  . CAD (coronary artery disease) 2010   dx made by coronary calcium scoring, Normal stress test in 2010.   Marland Kitchen GERD (gastroesophageal reflux disease) 12/05  . HLD (hyperlipidemia) 3/99  . HTN (hypertension) 2002   no meds  . Left rotator cuff tear 08/12/2011   Per Dr. Mardelle Matte 2013 L rotator cuff tear on MRI 2013   . Right rotator cuff tear 05/24/2013  . Wears glasses     Past Surgical History:  Procedure Laterality Date  . admitted to [facility] chest pain  12/15/05   Dr. Clayborn Bigness  . BLEPHAROPLASTY  10/11   Dr. Loletta Specter   . bone spur removed Right   . COLONOSCOPY  09/24/04   nml; Dr. Epifanio Lesches   . CORONARY ARTERY BYPASS GRAFT N/A 05/29/2020   Procedure: CORONARY ARTERY BYPASS GRAFTING (CABG) TIMES THREE USING LIMA to LAD; ENDOSCOPIC HARVESTED RIGHT GREATER SAPHENOUS VEIN: SVG to PD; SVG to RAMUS.;  Surgeon: Grace Isaac, MD;  Location: [place];  Service: Open Heart Surgery;  Laterality: N/A;  . EGD esophagitis by bx  01/26/06   gastritis bx neg h-pylori  . ENDOVEIN HARVEST OF GREATER SAPHENOUS VEIN Right 05/29/2020   Procedure: ENDOVEIN HARVEST OF GREATER SAPHENOUS VEIN;  Surgeon: Grace Isaac, MD;  Location: [place];  Service: Open Heart Surgery;  Laterality: Right;  . LEFT HEART CATH AND CORONARY ANGIOGRAPHY N/A 05/26/2020   Procedure: LEFT HEART CATH AND CORONARY ANGIOGRAPHY;  Surgeon: Corey Skains, MD;  Location: [facility];  Service: Cardiovascular;  Laterality: N/A;  . POLYPECTOMY    . right and left prostate needle bx  05/02/03   acute inflammation; Dr.  Gaynelle Arabian   . SHOULDER ARTHROSCOPY W/ ROTATOR CUFF REPAIR  12/2011   left  . SHOULDER ARTHROSCOPY WITH ROTATOR CUFF REPAIR AND SUBACROMIAL DECOMPRESSION Right 05/24/2013   Procedure: RIGHT SHOULDER ARTHROSCOPY WITH ROTATOR CUFF REPAIR AND SUBACROMIAL DECOMPRESSION AND PARTIAL ACROMIOPLSTY WITH CORACOACROMIAL RELEASE, DISTAL CLAVICULECTOMY, LABRIAL DEBRIDEMENT;  Surgeon: Johnny Bridge, MD;  Location: [place];  Service: Orthopedics;  Laterality: Right;  . spect ETT nml  12/16/05  . TEE WITHOUT CARDIOVERSION N/A 05/29/2020   Procedure: TRANSESOPHAGEAL ECHOCARDIOGRAM (TEE);  Surgeon: Grace Isaac, MD;  Location: [place];  Service: Open Heart Surgery;  Laterality: N/A;  . UPPER GASTROINTESTINAL ENDOSCOPY    . WISDOM TOOTH EXTRACTION      Social History   Tobacco Use  . Smoking status: Never Smoker  . Smokeless tobacco: Never Used  Substance Use Topics  . Alcohol use: Yes    Alcohol/week: 0.0 standard drinks    Comment:  beer, occ  . Drug use: No    Family History  Problem Relation Age of Onset  . Stroke Mother   . Glaucoma Other        grandmother at 52  . Barrett's esophagus Maternal Uncle   . Prostate cancer Neg Hx   . Colon cancer Neg Hx   . Colon polyps Neg Hx   . Esophageal cancer Neg Hx   . Rectal cancer Neg Hx   . Stomach cancer Neg Hx     PE: Vitals:   06/02/20 1240 06/02/20 1242 06/02/20 1300 06/02/20 1541  BP: (!) 142/83 (!) 150/78 137/75 139/70  Pulse: 82 82 98 86  Resp: 12 20 (!) 23 20  Temp:    98.4 F (36.9 C)  TempSrc:    Oral  SpO2: 99% 97% 99% 98%  Weight:      Height:       Patient appears to be in no acute distress  patient is alert and oriented x3 Atraumatic normocephalic head No cervical or supraclavicular lymphadenopathy appreciated No increased work of breathing, the patient does have audible wheezes and a productive cough Regular sinus rhythm/rate Abdomen is soft, nontender, nondistended Lower extremities are symmetric  without appreciable edema The patient's Foley catheter is draining brown urine. Grossly neurologically intact No identifiable skin lesions  Recent Labs    05/31/20 0401 06/01/20 0221  WBC 9.5 9.5  HGB 10.1* 10.8*  HCT 31.4* 32.9*   Recent Labs    05/31/20 0401 06/01/20 0221 06/02/20 0824  NA 136 137 140  K 4.1 4.3 3.9  CL 105 106 106  CO2 24 24 25   GLUCOSE 115* 127* 102*  BUN 21 21 18   CREATININE 0.98 0.84 0.84  CALCIUM 8.1* 8.3* 8.3*   No results for input(s): LABPT, INR in the last 72 hours. No results for input(s): LABURIN in the last 72 hours. Results for orders placed or performed during the hospital encounter of 05/27/20  Surgical pcr screen     Status: None   Collection Time: 05/28/20  6:43 PM   Specimen: Nasal Mucosa; Nasal Swab  Result Value Ref Range Status   MRSA, PCR NEGATIVE NEGATIVE Final   Staphylococcus aureus NEGATIVE NEGATIVE Final    Comment: (NOTE) The Xpert SA Assay (FDA approved for NASAL specimens in patients 67 years of age and older), is one component of a comprehensive surveillance program. It is not intended to diagnose infection nor to guide or monitor treatment. Performed at [facility], [place] [address], [place], [place] [address]     Imaging: none  Imp: I suspect that the etiology of the patient's hematuria and bladder spasms was due to the catheter being pulled into the patient's prostate during the transfer from the ICU. I was able to deflate the Foley catheter balloon and easily push it back into the patient's bladder. At that time, there was a significant amount of urine that was easily drained. The patient felt some pressure relieved as well. Hopefully, this will also reduce or prevent additional bladder spasms.  Recommendations: Given that the patient did not have severe urinary retention but only difficulty emptying his bladder more completely I do not think he needs the Foley catheter for a prolonged time. However, he is  likely to have some prosthetic swelling and edema from the trauma associated with the balloon being pulled into his prostate. I recommended that the patient keep his Foley catheter until Friday morning. If the patient has been discharged  home by this time, I instructed the wife on how to remove the Foley catheter. If the patient cannot void by at 1 PM he will proceed to the [facility] for instructions on how to perform clean intermittent catheterization.  In addition, I have discontinued the patient's oxybutynin in favor of B&O suppositories. The patient can get these every 8 as needed for bladder spasms and pelvic pain. The patient should not get these within 12 hours for at least 12 hours prior to his Foley catheter being removed.  The patient will follow up with his primary urologist in [place] once he gets stronger and has recovered from his heart surgery for further discussion on his prostate procedure.   Thank you for involving me in this patient's care, please page with any further questions or concerns.   Ardis Hughs

## 2020-06-02 NOTE — Progress Notes (Signed)
CARDIAC REHAB PHASE I   Offered to walk with pt. Pt declining at this time due to intermittent bladder spasms, which are a 10/10 at their worst. Pt HR alternating between Aflutter, Afib, and SR with PACS. Encouraged pt to rest, will return to ambulate after lunch.  [phone] Rufina Falco, RN BSN 06/02/2020 11:55 AM

## 2020-06-02 NOTE — Progress Notes (Addendum)
[address]       [place] [address]             [phone]      4 Days Post-Op Procedure(s) (LRB): CORONARY ARTERY BYPASS GRAFTING (CABG) TIMES THREE USING LIMA to LAD; ENDOSCOPIC HARVESTED RIGHT GREATER SAPHENOUS VEIN: SVG to PD; SVG to RAMUS. (N/A) TRANSESOPHAGEAL ECHOCARDIOGRAM (TEE) (N/A) ENDOVEIN HARVEST OF GREATER SAPHENOUS VEIN (Right) Subjective: C/O urinary tract discomfort/spasm  Objective: Vital signs in last 24 hours: Temp:  [98.1 F (36.7 C)-98.8 F (37.1 C)] 98.7 F (37.1 C) (11/09 0524) Pulse Rate:  [54-117] 90 (11/09 0300) Cardiac Rhythm: Atrial flutter (11/09 0144) Resp:  [18-25] 20 (11/09 0333) BP: (108-158)/(69-86) 134/76 (11/09 0524) SpO2:  [90 %-100 %] 92 % (11/09 0524) Weight:  [92.2 kg] 92.2 kg (11/09 0333)  Hemodynamic parameters for last 24 hours:    Intake/Output from previous day: 11/08 0701 - 11/09 0700 In: 570.2 [P.O.:480; I.V.:90.2] Out: 1020 [Urine:1020] Intake/Output this shift: No intake/output data recorded.  General appearance: alert, cooperative and no distress Heart: irregularly irregular rhythm Lungs: clear to auscultation bilaterally Abdomen: bening Extremities: no edema Wound: incis healing well  Lab Results: Recent Labs    05/31/20 0401 06/01/20 0221  WBC 9.5 9.5  HGB 10.1* 10.8*  HCT 31.4* 32.9*  PLT 117* 141*   BMET:  Recent Labs    05/31/20 0401 06/01/20 0221  NA 136 137  K 4.1 4.3  CL 105 106  CO2 24 24  GLUCOSE 115* 127*  BUN 21 21  CREATININE 0.98 0.84  CALCIUM 8.1* 8.3*    PT/INR: No results for input(s): LABPROT, INR in the last 72 hours. ABG    Component Value Date/Time   PHART 7.356 05/29/2020 1802   HCO3 19.9 (L) 05/29/2020 1802   TCO2 21 (L) 05/29/2020 1802   ACIDBASEDEF 5.0 (H) 05/29/2020 1802   O2SAT 97.0 05/29/2020 1802   CBG (last 3)  Recent Labs    06/01/20 1319 06/01/20 2228 06/02/20 0602  GLUCAP 107* 162* 102*    Meds Scheduled Meds: .  alfuzosin  10 mg Oral QHS  . amiodarone  400 mg Oral BID  . aspirin EC  325 mg Oral Daily  . atorvastatin  80 mg Oral q1800  . chlorhexidine  15 mL Mouth Rinse BID  . Chlorhexidine Gluconate Cloth  6 each Topical Daily  . docusate sodium  200 mg Oral Daily  . enoxaparin (LOVENOX) injection  40 mg Subcutaneous QHS  . finasteride  5 mg Oral QHS  . icosapent Ethyl  2 g Oral BID  . insulin aspart  0-24 Units Subcutaneous TID AC & HS  . mouth rinse  15 mL Mouth Rinse q12n4p  . metoprolol tartrate  12.5 mg Oral BID  . pantoprazole  40 mg Oral QAC breakfast  . sodium chloride flush  3 mL Intravenous Q12H   Continuous Infusions: . sodium chloride     PRN Meds:.sodium chloride, acetaminophen, alum & mag hydroxide-simeth, bisacodyl **OR** bisacodyl, guaiFENesin, magnesium hydroxide, ondansetron **OR** ondansetron (ZOFRAN) IV, sodium chloride flush, traMADol  Xrays DG Chest 2 View  Result Date: 06/01/2020 CLINICAL DATA:  Status post coronary artery bypass grafting EXAM: CHEST - 2 VIEW COMPARISON:  May 31, 2020 FINDINGS: Cordis has been removed. Temporary pacemaker wires are attached to the right heart. No pneumothorax. There is atelectatic change in the lung bases. There is a small left pleural effusion. Heart is upper normal in size with pulmonary vascularity  normal. Status post coronary artery bypass grafting. There is degenerative change in the thoracic spine IMPRESSION: Small left pleural effusion with bibasilar atelectasis. No pneumothorax. Heart upper normal in size. Postoperative changes noted. Electronically Signed   By: Lowella Grip III M.D.   On: 06/01/2020 07:57    Assessment/Plan: S/P Procedure(s) (LRB): CORONARY ARTERY BYPASS GRAFTING (CABG) TIMES THREE USING LIMA to LAD; ENDOSCOPIC HARVESTED RIGHT GREATER SAPHENOUS VEIN: SVG to PD; SVG to RAMUS. (N/A) TRANSESOPHAGEAL ECHOCARDIOGRAM (TEE) (N/A) ENDOVEIN HARVEST OF GREATER SAPHENOUS VEIN (Right)  1 developed afib cont po  amio- rate currently controlled 2 needed foley placed- will get urol consult, having spasms- will order ditropan 3 afeb, some HTN at times- mostly well controlled 4 sats ok on RA 5 no new labs- will add, BMET, MG++, TSH 5 BS controlled     LOS: 6 days 2   [name] PA-C Pager [phone] 06/02/2020 Now with foley in place and bloody urine afib started last night, no on po Cordarone, rate slowed but still afib Urology and Cardiology to see I have seen and examined Clayton Powell and agree with the above assessment  and plan.  Grace Isaac MD Beeper [phone] Office [phone] 06/02/2020 4:14 PM

## 2020-06-02 NOTE — Progress Notes (Signed)
Patient c/o  difficulty urinating, patient noted to be restless, patient requesting for a foley saying that  this is a reoccurring issue, bladder scan done, verbal order received from [facility] [place] to insert foley, foley inserted without difficulty will continue to monitor.

## 2020-06-02 NOTE — Progress Notes (Signed)
Mobility Specialist - Progress Note   06/02/20 1705  Mobility  Activity Transferred to/from [facility]  Level of Assistance Standby assist, set-up cues, supervision of patient - no hands on  Assistive Device None  Mobility Response Tolerated well  Mobility performed by Mobility specialist  $Mobility charge 1 Mobility   Asked by RN to assist pt to/from [facility]. He was asx throughout. HR remained in 60s, SpO2 was 100%.  Pricilla Handler Mobility Specialist Mobility Specialist Phone: [phone]

## 2020-06-02 NOTE — Op Note (Signed)
NAME: Clayton Powell, Clayton Powell RECORD IO:[id number] ACCOUNT [id number] DATE OF BIRTH:Nov 27, 1945 FACILITY: [facility] LOCATION: [id number] PHYSICIAN:Deania Siguenza Maryruth Bun, MD  OPERATIVE REPORT  DATE OF PROCEDURE:  05/29/2020  PREOPERATIVE DIAGNOSIS:  Coronary occlusive disease with unstable angina.  POSTOPERATIVE DIAGNOSIS:  Coronary occlusive disease with unstable angina.  SURGICAL PROCEDURE:  Coronary artery bypass grafting x3 with the left internal mammary to the left anterior descending coronary artery, reverse saphenous vein graft to the ramus coronary artery, reverse saphenous vein graft to the distal right coronary  artery, with right greater saphenous endoscopic vein harvesting, right thigh and calf, and incidental lymph node biopsy of internal mammary cleft lymph node.  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  Over the past several months the patient had noted increasing shortness of breath and vague chest discomfort as he walked across the parking lot to work.  Several days before transfer to [facility] this had become much more symptomatic.  He  contacted his primary care doctor and was immediately evaluated by cardiology and had cardiac catheterization, which demonstrated severe 3-vessel coronary artery disease with a small right coronary artery totally occluded, complex disease involving the  proximal ramus branch and LAD.  There was very small circumflex branches that faintly filled late with the predominant supplier of the lateral wall being a large ramus.  Risks and options of coronary artery bypass grafting were discussed with the patient  after transfer to [facility] for further care and evaluation.   Coronary artery bypass grafting was recommended to the patient who agreed and signed informed consent.  DESCRIPTION OF PROCEDURE:  With Swan-Ganz and arterial line monitors in place, the patient underwent general endotracheal anesthesia without incident.  Skin of chest and legs was prepped with Betadine, draped in the usual sterile manner.  TEE probe was  placed by Dr. Smith Robert.  This showed preserved LV function without significant valvular abnormality.  Appropriate timeout was performed and then we proceeded with endoscopic vein harvesting of the right greater saphenous vein from the thigh and upper calf.   This vein was of excellent quality and caliber.  Median sternotomy was performed.  The left internal mammary artery was dissected down as a pedicle graft.  The mammary artery was relatively small, but had excellent free flow.  The vessel was  hydrostatically dilated with heparinized saline.  Pericardium was opened.  The patient's ascending aorta was of normal size.  He was systemically heparinized.  The ascending aorta was cannulated.  The right atrium was cannulated with a dual-stage venous  cannula.  The heart was then elevated and the distal right coronary artery, which was a small vessel, was identified and appeared suitable for bypass.  The heart was elevated.  There was a large partially intramyocardial ramus branch that was the primary  supplier of the lateral wall.  It was a small bifurcated very distal circumflex arising from the AV groove that was less than 1 mm in size and not bypassable.  Sites of anastomosis were selected and dissected at the epicardium.  The patient's body  temperature was cooled to 32 degrees.  Aortic crossclamp was applied and 600 mL of cold blood potassium cardioplegia was administered antegrade.  Myocardial septal temperature was monitored throughout  the crossclamp.  Topical cold was used.  We turned  our attention first to the distal right coronary artery, which was  opened and admitted 1.5 mm probe proximally and distally.  Using a running 7-0 Prolene, distal anastomosis was performed with a segment of reverse saphenous vein graft.  The heart was  then elevated and the large ramus branch was opened and easily admitted a 1.5 mm probe distally.  Using a running 7-0 Prolene a distal anastomosis with a segment of reverse saphenous vein graft was done.  We then turned our attention to the left anterior  descending coronary artery.  The midportion of the vessel was partially calcified.  Just distal to this, the vessel was suitable and was opened and admitted 1.5 mm probe proximally and distally.  Using a running 8-0 Prolene, the left internal mammary  artery was anastomosed to left anterior descending coronary artery.  With cross-clamp still in place, 2 punch aortotomies were performed in the ascending aorta and each of the 2 vein grafts were anastomosed to the ascending aorta.  The bulldog on the  mammary artery was removed with prompt rise in myocardial septal temperature.  The heart was allowed to passively fill and the proximal anastomoses were completed after de-airing the heart.  Aortic crossclamp was removed with total crossclamp time of 83  minutes.  The patient spontaneously converted to a sinus rhythm.  Sites of anastomosis were inspected and were free of bleeding.  The patient's body temperature was rewarmed to 37 degrees.  He was then ventilated and weaned from cardiopulmonary bypass  without difficulty.  He was decannulated in the usual fashion.  Protamine sulfate was administered.  Atrial and ventricular pacing wires were applied.  Graft markers were applied.  A left pleural tube and a Blake mediastinal drain were left in place.   Pericardium was loosely reapproximated.  Sternum was closed with #6 stainless steel wire.  Fascia closed with interrupted 0 Vicryl, running 3-0 Vicryl in subcutaneous tissue, 3-0 subcuticular stitch in skin edges.  Dry dressings were  applied.  Sponge and  needle count was reported as correct at completion of the procedure.  The patient tolerated the procedure without obvious complication and was transferred to the surgical intensive care unit for further postoperative care.  He did not require any blood  bank blood products.  It should be noted that as we were dissecting down the left internal mammary artery, there was a somewhat enlarged lymph node along the mammary chain.  This was removed and sent for permanent analysis by pathology.  CN/NUANCE  D:06/01/2020 T:06/01/2020 JOB:[id number]

## 2020-06-02 NOTE — Progress Notes (Signed)
Mobility Specialist - Progress Note   06/02/20 1356  Mobility  Activity Contraindicated/medical hold    Informed by cardiac rehab to hold mobility.   Pricilla Handler Mobility Specialist Mobility Specialist Phone: [phone]

## 2020-06-03 LAB — GLUCOSE, CAPILLARY
Glucose-Capillary: 109 mg/dL — ABNORMAL HIGH (ref 70–99)
Glucose-Capillary: 122 mg/dL — ABNORMAL HIGH (ref 70–99)
Glucose-Capillary: 98 mg/dL (ref 70–99)
Glucose-Capillary: 104 mg/dL — ABNORMAL HIGH (ref 70–99)

## 2020-06-03 MED ORDER — LISINOPRIL 5 MG PO TABS
5.0000 mg | ORAL_TABLET | Freq: Every day | ORAL | Status: DC
  Administered 2020-06-03: 5 mg via ORAL
  Filled 2020-06-03: qty 1

## 2020-06-03 NOTE — Progress Notes (Signed)
Mobility Specialist - Progress Note   06/03/20 1354  Mobility  Activity Ambulated in hall  Level of Assistance Standby assist, set-up cues, supervision of patient - no hands on  Assistive Device Front wheel walker  Distance Ambulated (ft) 490 ft  Mobility Response Tolerated well  Mobility performed by Mobility specialist  $Mobility charge 1 Mobility    Pre-mobility: 63 HR, 147/86 BP During mobility: 67 HR Post-mobility: 62 HR, 139/81 BP  Pt c/o dizziness when standing initially which resolved w/ time. He says this is the first day he has "felt normal" in a while. Pt asx throughout ambulation.   Pricilla Handler Mobility Specialist Mobility Specialist Phone: [phone]

## 2020-06-03 NOTE — Plan of Care (Signed)
  Problem: Education: Goal: Knowledge of General Education information will improve Description: Including pain rating scale, medication(s)/side effects and non-pharmacologic comfort measures Outcome: Progressing   Problem: Health Behavior/Discharge Planning: Goal: Ability to manage health-related needs will improve Outcome: Progressing   Problem: Clinical Measurements: Goal: Ability to maintain clinical measurements within normal limits will improve Outcome: Progressing Goal: Will remain free from infection Outcome: Progressing Goal: Diagnostic test results will improve Outcome: Progressing Goal: Respiratory complications will improve Outcome: Progressing Goal: Cardiovascular complication will be avoided Outcome: Progressing   Problem: Activity: Goal: Risk for activity intolerance will decrease Outcome: Progressing   Problem: Nutrition: Goal: Adequate nutrition will be maintained Outcome: Progressing   Problem: Coping: Goal: Level of anxiety will decrease Outcome: Progressing   Problem: Elimination: Goal: Will not experience complications related to bowel motility Outcome: Progressing Goal: Will not experience complications related to urinary retention Outcome: Progressing   Problem: Pain Managment: Goal: General experience of comfort will improve Outcome: Progressing   Problem: Safety: Goal: Ability to remain free from injury will improve Outcome: Progressing   Problem: Skin Integrity: Goal: Risk for impaired skin integrity will decrease Outcome: Progressing   Problem: Education: Goal: Will demonstrate proper wound care and an understanding of methods to prevent future damage Outcome: Progressing Goal: Knowledge of disease or condition will improve Outcome: Progressing Goal: Knowledge of the prescribed therapeutic regimen will improve Outcome: Progressing Goal: Individualized Educational Video(s) Outcome: Progressing   Problem: Activity: Goal: Risk for  activity intolerance will decrease Outcome: Progressing   Problem: Cardiac: Goal: Will achieve and/or maintain hemodynamic stability Outcome: Progressing   Problem: Clinical Measurements: Goal: Postoperative complications will be avoided or minimized Outcome: Progressing   Problem: Respiratory: Goal: Respiratory status will improve Outcome: Progressing   Problem: Skin Integrity: Goal: Wound healing without signs and symptoms of infection Outcome: Progressing Goal: Risk for impaired skin integrity will decrease Outcome: Progressing   Problem: Urinary Elimination: Goal: Ability to achieve and maintain adequate renal perfusion and functioning will improve Outcome: Progressing   Problem: Education: Goal: Will demonstrate proper wound care and an understanding of methods to prevent future damage Outcome: Progressing Goal: Knowledge of disease or condition will improve Outcome: Progressing Goal: Knowledge of the prescribed therapeutic regimen will improve Outcome: Progressing Goal: Individualized Educational Video(s) Outcome: Progressing   Problem: Activity: Goal: Risk for activity intolerance will decrease Outcome: Progressing   Problem: Cardiac: Goal: Will achieve and/or maintain hemodynamic stability Outcome: Progressing   Problem: Clinical Measurements: Goal: Postoperative complications will be avoided or minimized Outcome: Progressing   Problem: Respiratory: Goal: Respiratory status will improve Outcome: Progressing   Problem: Skin Integrity: Goal: Wound healing without signs and symptoms of infection Outcome: Progressing Goal: Risk for impaired skin integrity will decrease Outcome: Progressing   Problem: Urinary Elimination: Goal: Ability to achieve and maintain adequate renal perfusion and functioning will improve Outcome: Progressing   

## 2020-06-03 NOTE — Progress Notes (Signed)
CARDIAC REHAB PHASE I   PRE:  Rate/Rhythm: 62 SR     SaO2: 96 RA  MODE:  Ambulation: 300 ft   POST:  Rate/Rhythm: 66 SR  BP:  Sitting: 118/66    SaO2: 94 RA  Pt helped to [facility] than ambulated 319ft in hallway standby assist with front wheel walker. Pt states more weakness today, but was not able to walk in the hallway yesterday. Pt states some dizziness at end of walk. BP stable. Pt returned to BR. BR call ligth within reach. Encouraged continued IS use and walks. Will continue to follow.  [phone] Rufina Falco, RN BSN 06/03/2020 11:58 AM

## 2020-06-03 NOTE — Progress Notes (Addendum)
[address]       [place] [address]             [phone]      5 Days Post-Op Procedure(s) (LRB): CORONARY ARTERY BYPASS GRAFTING (CABG) TIMES THREE USING LIMA to LAD; ENDOSCOPIC HARVESTED RIGHT GREATER SAPHENOUS VEIN: SVG to PD; SVG to RAMUS. (N/A) TRANSESOPHAGEAL ECHOCARDIOGRAM (TEE) (N/A) ENDOVEIN HARVEST OF GREATER SAPHENOUS VEIN (Right) Subjective:  seen by Urology - appreciate  Feeling better overall Now in sinus rhythm Objective: Vital signs in last 24 hours: Temp:  [98.1 F (36.7 C)-99.8 F (37.7 C)] 98.3 F (36.8 C) (11/10 0400) Pulse Rate:  [62-115] 63 (11/10 0400) Cardiac Rhythm: Normal sinus rhythm (11/09 1949) Resp:  [12-28] 21 (11/10 0400) BP: (110-163)/(64-98) 127/74 (11/10 0400) SpO2:  [93 %-99 %] 94 % (11/10 0400) Weight:  [91.7 kg] 91.7 kg (11/10 0412)  Hemodynamic parameters for last 24 hours:    Intake/Output from previous day: 11/09 0701 - 11/10 0700 In: 100 [P.O.:100] Out: 1301 [Urine:1300; Stool:1] Intake/Output this shift: No intake/output data recorded.  General appearance: alert, cooperative and no distress Heart: regular rate and rhythm Lungs: clear to auscultation bilaterally Abdomen: benign Extremities: no edema Wound: incis healing well  Lab Results: Recent Labs    06/01/20 0221  WBC 9.5  HGB 10.8*  HCT 32.9*  PLT 141*   BMET:  Recent Labs    06/01/20 0221 06/02/20 0824  NA 137 140  K 4.3 3.9  CL 106 106  CO2 24 25  GLUCOSE 127* 102*  BUN 21 18  CREATININE 0.84 0.84  CALCIUM 8.3* 8.3*    PT/INR: No results for input(s): LABPROT, INR in the last 72 hours. ABG    Component Value Date/Time   PHART 7.356 05/29/2020 1802   HCO3 19.9 (L) 05/29/2020 1802   TCO2 21 (L) 05/29/2020 1802   ACIDBASEDEF 5.0 (H) 05/29/2020 1802   O2SAT 97.0 05/29/2020 1802   CBG (last 3)  Recent Labs    06/02/20 1116 06/02/20 2055 06/03/20 0620  GLUCAP 91 107* 109*    Meds Scheduled Meds: . alfuzosin   10 mg Oral QHS  . amiodarone  400 mg Oral BID  . aspirin EC  325 mg Oral Daily  . atorvastatin  80 mg Oral q1800  . chlorhexidine  15 mL Mouth Rinse BID  . Chlorhexidine Gluconate Cloth  6 each Topical Daily  . docusate sodium  200 mg Oral Daily  . enoxaparin (LOVENOX) injection  40 mg Subcutaneous QHS  . finasteride  5 mg Oral QHS  . icosapent Ethyl  2 g Oral BID  . insulin aspart  0-24 Units Subcutaneous TID AC & HS  . magnesium oxide  400 mg Oral BID  . mouth rinse  15 mL Mouth Rinse q12n4p  . metoprolol tartrate  12.5 mg Oral BID  . pantoprazole  40 mg Oral QAC breakfast  . sodium chloride flush  3 mL Intravenous Q12H   Continuous Infusions: . sodium chloride     PRN Meds:.sodium chloride, acetaminophen, alum & mag hydroxide-simeth, bisacodyl **OR** bisacodyl, guaiFENesin, magnesium hydroxide, ondansetron **OR** ondansetron (ZOFRAN) IV, opium-belladonna, sodium chloride flush, traMADol  Xrays No results found.  Assessment/Plan: S/P Procedure(s) (LRB): CORONARY ARTERY BYPASS GRAFTING (CABG) TIMES THREE USING LIMA to LAD; ENDOSCOPIC HARVESTED RIGHT GREATER SAPHENOUS VEIN: SVG to PD; SVG to RAMUS. (N/A) TRANSESOPHAGEAL ECHOCARDIOGRAM (TEE) (N/A) ENDOVEIN HARVEST OF GREATER SAPHENOUS VEIN (Right)  1 Tmax 99.8, VSS 2 has converted to  sinus rhythm 3 sats good on RA 4 Normal renal fxn- adeq UOP with foley in- plan to leave till Friday 5 will d/c epw's today  LOS: 7 days    Clayton Giovanni PA-C Pager [phone] 06/03/2020  Feels better today  Urology has seen and left recommendations Likely home Friday hope without foley Holding sinus now  I have seen and examined Clayton Powell and agree with the above assessment  and plan.  Grace Isaac MD Beeper [phone] Office [phone] 06/03/2020 9:30 AM

## 2020-06-04 LAB — GLUCOSE, CAPILLARY
Glucose-Capillary: 137 mg/dL — ABNORMAL HIGH (ref 70–99)
Glucose-Capillary: 97 mg/dL (ref 70–99)
Glucose-Capillary: 144 mg/dL — ABNORMAL HIGH (ref 70–99)
Glucose-Capillary: 105 mg/dL — ABNORMAL HIGH (ref 70–99)

## 2020-06-04 MED ORDER — DIPHENHYDRAMINE-ZINC ACETATE 2-0.1 % EX CREA
TOPICAL_CREAM | Freq: Three times a day (TID) | CUTANEOUS | Status: DC | PRN
  Administered 2020-06-04 – 2020-06-07 (×4): 1 via TOPICAL
  Filled 2020-06-04: qty 28

## 2020-06-04 MED ORDER — LISINOPRIL 10 MG PO TABS
20.0000 mg | ORAL_TABLET | Freq: Every day | ORAL | Status: DC
  Administered 2020-06-04 – 2020-06-07 (×4): 20 mg via ORAL
  Filled 2020-06-04 (×4): qty 2

## 2020-06-04 NOTE — Progress Notes (Signed)
Called to patient's room stated he is leaking around foley and wet again. When checked, pt had leaked very small amount and foley was still draining small amount of urine. Pt requested suppository for bladder spasm, administered as ordered. Instructed pt to call back if he leaks again and I will flush it again before calling urology again. Pt voiced understanding. When went to check on pt an hour after , he was resting and stated feels better. Will continue to monitor.

## 2020-06-04 NOTE — Plan of Care (Signed)
  Problem: Education: Goal: Knowledge of General Education information will improve Description: Including pain rating scale, medication(s)/side effects and non-pharmacologic comfort measures Outcome: Progressing   Problem: Health Behavior/Discharge Planning: Goal: Ability to manage health-related needs will improve Outcome: Progressing   Problem: Clinical Measurements: Goal: Ability to maintain clinical measurements within normal limits will improve Outcome: Progressing Goal: Will remain free from infection Outcome: Progressing Goal: Diagnostic test results will improve Outcome: Progressing Goal: Respiratory complications will improve Outcome: Progressing Goal: Cardiovascular complication will be avoided Outcome: Progressing   Problem: Activity: Goal: Risk for activity intolerance will decrease Outcome: Progressing   Problem: Nutrition: Goal: Adequate nutrition will be maintained Outcome: Progressing   Problem: Coping: Goal: Level of anxiety will decrease Outcome: Progressing   Problem: Elimination: Goal: Will not experience complications related to bowel motility Outcome: Progressing Goal: Will not experience complications related to urinary retention Outcome: Progressing   Problem: Pain Managment: Goal: General experience of comfort will improve Outcome: Progressing   Problem: Safety: Goal: Ability to remain free from injury will improve Outcome: Progressing   Problem: Skin Integrity: Goal: Risk for impaired skin integrity will decrease Outcome: Progressing   Problem: Education: Goal: Will demonstrate proper wound care and an understanding of methods to prevent future damage Outcome: Progressing Goal: Knowledge of disease or condition will improve Outcome: Progressing Goal: Knowledge of the prescribed therapeutic regimen will improve Outcome: Progressing Goal: Individualized Educational Video(s) Outcome: Progressing   Problem: Activity: Goal: Risk for  activity intolerance will decrease Outcome: Progressing   Problem: Cardiac: Goal: Will achieve and/or maintain hemodynamic stability Outcome: Progressing   Problem: Clinical Measurements: Goal: Postoperative complications will be avoided or minimized Outcome: Progressing   Problem: Respiratory: Goal: Respiratory status will improve Outcome: Progressing   Problem: Skin Integrity: Goal: Wound healing without signs and symptoms of infection Outcome: Progressing Goal: Risk for impaired skin integrity will decrease Outcome: Progressing   Problem: Urinary Elimination: Goal: Ability to achieve and maintain adequate renal perfusion and functioning will improve Outcome: Progressing   Problem: Education: Goal: Will demonstrate proper wound care and an understanding of methods to prevent future damage Outcome: Progressing Goal: Knowledge of disease or condition will improve Outcome: Progressing Goal: Knowledge of the prescribed therapeutic regimen will improve Outcome: Progressing Goal: Individualized Educational Video(s) Outcome: Progressing   Problem: Activity: Goal: Risk for activity intolerance will decrease Outcome: Progressing   Problem: Cardiac: Goal: Will achieve and/or maintain hemodynamic stability Outcome: Progressing   Problem: Clinical Measurements: Goal: Postoperative complications will be avoided or minimized Outcome: Progressing   Problem: Respiratory: Goal: Respiratory status will improve Outcome: Progressing   Problem: Skin Integrity: Goal: Wound healing without signs and symptoms of infection Outcome: Progressing Goal: Risk for impaired skin integrity will decrease Outcome: Progressing   Problem: Urinary Elimination: Goal: Ability to achieve and maintain adequate renal perfusion and functioning will improve Outcome: Progressing   

## 2020-06-04 NOTE — Progress Notes (Signed)
CARDIAC REHAB PHASE I   PRE:  Rate/Rhythm: 53 SB    BP: sitting 141/82    SaO2: 94 RA  MODE:  Ambulation: 800 ft   POST:  Rate/Rhythm: 68 SR    BP: sitting 146/74     SaO2: 96 RA  Pt moving well. Ambulated with RW at steady pace. Could try without RW next walk. To recliner. VSS. Discussed education with pt and wife. Discussed IS, sternal precautions, diet, exercise, and CRPII. Pt and wife very receptive, will refer to [place]. [place], ACSM 06/04/2020 12:11 PM

## 2020-06-04 NOTE — Progress Notes (Addendum)
[address]       [place] [address]             [phone]      6 Days Post-Op Procedure(s) (LRB): CORONARY ARTERY BYPASS GRAFTING (CABG) TIMES THREE USING LIMA to LAD; ENDOSCOPIC HARVESTED RIGHT GREATER SAPHENOUS VEIN: SVG to PD; SVG to RAMUS. (N/A) TRANSESOPHAGEAL ECHOCARDIOGRAM (TEE) (N/A) ENDOVEIN HARVEST OF GREATER SAPHENOUS VEIN (Right) Subjective: Difficult night d/t clots in foley requiring irrigation  Objective: Vital signs in last 24 hours: Temp:  [98.2 F (36.8 C)-98.6 F (37 C)] 98.3 F (36.8 C) (11/11 0436) Pulse Rate:  [60-69] 69 (11/11 0436) Cardiac Rhythm: Normal sinus rhythm (11/10 1900) Resp:  [15-24] 15 (11/11 0436) BP: (118-151)/(66-89) 130/82 (11/11 0436) SpO2:  [93 %-100 %] 94 % (11/11 0436) Weight:  [91.1 kg] 91.1 kg (11/11 0436)  Hemodynamic parameters for last 24 hours:    Intake/Output from previous day: 11/10 0701 - 11/11 0700 In: 620 [P.O.:360] Out: 1450 [Urine:1450] Intake/Output this shift: No intake/output data recorded.  General appearance: alert, cooperative and no distress Heart: regular rate and rhythm Lungs: slightly dim in bases Abdomen: benign Extremities: no edema or calf tenderness Wound: incis healing well  Lab Results: No results for input(s): WBC, HGB, HCT, PLT in the last 72 hours. BMET: Recent Labs    06/02/20 0824  NA 140  K 3.9  CL 106  CO2 25  GLUCOSE 102*  BUN 18  CREATININE 0.84  CALCIUM 8.3*    PT/INR: No results for input(s): LABPROT, INR in the last 72 hours. ABG    Component Value Date/Time   PHART 7.356 05/29/2020 1802   HCO3 19.9 (L) 05/29/2020 1802   TCO2 21 (L) 05/29/2020 1802   ACIDBASEDEF 5.0 (H) 05/29/2020 1802   O2SAT 97.0 05/29/2020 1802   CBG (last 3)  Recent Labs    06/03/20 1735 06/03/20 2059 06/04/20 0614  GLUCAP 98 104* 137*    Meds Scheduled Meds: . alfuzosin  10 mg Oral QHS  . amiodarone  400 mg Oral BID  . aspirin EC  325 mg Oral Daily  .  atorvastatin  80 mg Oral q1800  . chlorhexidine  15 mL Mouth Rinse BID  . Chlorhexidine Gluconate Cloth  6 each Topical Daily  . docusate sodium  200 mg Oral Daily  . enoxaparin (LOVENOX) injection  40 mg Subcutaneous QHS  . finasteride  5 mg Oral QHS  . icosapent Ethyl  2 g Oral BID  . insulin aspart  0-24 Units Subcutaneous TID AC & HS  . lisinopril  5 mg Oral Daily  . magnesium oxide  400 mg Oral BID  . mouth rinse  15 mL Mouth Rinse q12n4p  . metoprolol tartrate  12.5 mg Oral BID  . pantoprazole  40 mg Oral QAC breakfast  . sodium chloride flush  3 mL Intravenous Q12H   Continuous Infusions: . sodium chloride     PRN Meds:.sodium chloride, acetaminophen, alum & mag hydroxide-simeth, bisacodyl **OR** bisacodyl, guaiFENesin, magnesium hydroxide, ondansetron **OR** ondansetron (ZOFRAN) IV, opium-belladonna, sodium chloride flush, traMADol  Xrays No results found.  Assessment/Plan: S/P Procedure(s) (LRB): CORONARY ARTERY BYPASS GRAFTING (CABG) TIMES THREE USING LIMA to LAD; ENDOSCOPIC HARVESTED RIGHT GREATER SAPHENOUS VEIN: SVG to PD; SVG to RAMUS. (N/A) TRANSESOPHAGEAL ECHOCARDIOGRAM (TEE) (N/A) ENDOVEIN HARVEST OF GREATER SAPHENOUS VEIN (Right)  1 doing well 2 sinus rhythm- conts po amio and beta blocker as current 3 + HTN, lisinopril low dose started yesterday,  will increase 4 adeq UOP, will need to ask urology to recheck as may need 3 way catheter for irrigation 5 no new labs  LOS: 8 days    Clayton Giovanni PA-C Pager [phone] 06/04/2020  Patient had difficult night with blood clots in his Foley catheter, I recontacted urology to address We will hold on removal of the catheter at this point Patient holding sinus rhythm I have seen and examined [name] and agree with the above assessment  and plan.  Grace Isaac MD Beeper [phone] Office [phone] 06/04/2020 1:04 PM

## 2020-06-04 NOTE — Consult Note (Signed)
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 TRANSESOPHAGEAL ECHOCARDIOGRAM (TEE) ENDOVEIN HARVEST OF GREATER SAPHENOUS VEIN  7 Days Post-Op   Intv/Subj: PAtient had spasms night of 11/10 into 11/11 which the nursing staff irrigated catheter. When I saw patient (yesterday AM) he was doing better - urine was straw colored, no clots.  Active Problems:   CAD (coronary artery disease)   S/P CABG x 3  Current Facility-Administered Medications  Medication Dose Route Frequency Provider Last Rate Last Admin   0.9 %  sodium chloride infusion  250 mL Intravenous PRN Grace Isaac, MD       acetaminophen (TYLENOL) tablet 650 mg  650 mg Oral Q6H PRN Grace Isaac, MD   650 mg at 06/04/20 0749   alfuzosin (UROXATRAL) 24 hr tablet 10 mg  10 mg Oral QHS Grace Isaac, MD   10 mg at 06/04/20 2131   alum & mag hydroxide-simeth (MAALOX/MYLANTA) 200-200-20 MG/5ML suspension 15-30 mL  15-30 mL Oral Q4H PRN Grace Isaac, MD       amiodarone (PACERONE) tablet 400 mg  400 mg Oral BID Gold, Wayne E, PA-C   400 mg at 06/04/20 2131   aspirin EC tablet 325 mg  325 mg Oral Daily Grace Isaac, MD   325 mg at 06/04/20 0949   atorvastatin (LIPITOR) tablet 80 mg  80 mg Oral q1800 Grace Isaac, MD   80 mg at 06/04/20 1806   bisacodyl (DULCOLAX) EC tablet 10 mg  10 mg Oral Daily PRN Grace Isaac, MD       Or   bisacodyl (DULCOLAX) suppository 10 mg  10 mg Rectal Daily PRN Grace Isaac, MD   10 mg at 06/02/20 5284   chlorhexidine (PERIDEX) 0.12 % solution 15 mL  15 mL Mouth Rinse BID Grace Isaac, MD   15 mL at 06/04/20 2130   Chlorhexidine Gluconate Cloth 2 % PADS 6 each  6 each Topical Daily Grace Isaac, MD   6 each at 06/04/20 0930    diphenhydrAMINE-zinc acetate (BENADRYL) 2-0.1 % cream   Topical TID PRN Jadene Pierini E, PA-C   1 application at 13/24/40 2131   docusate sodium (COLACE) capsule 200 mg  200 mg Oral Daily Grace Isaac, MD   200 mg at 06/04/20 0950   enoxaparin (LOVENOX) injection 40 mg  40 mg Subcutaneous QHS Grace Isaac, MD   40 mg at 06/04/20 2130   finasteride (PROSCAR) tablet 5 mg  5 mg Oral QHS Grace Isaac, MD   5 mg at 06/04/20 2131   guaiFENesin (MUCINEX) 12 hr tablet 600 mg  600 mg Oral Q12H PRN Grace Isaac, MD   600 mg at 06/02/20 1027   icosapent Ethyl (VASCEPA) 1 g capsule 2 g  2 g Oral BID Grace Isaac, MD   2 g at 06/04/20 2130   insulin aspart (novoLOG) injection 0-24 Units  0-24 Units Subcutaneous TID AC & HS Grace Isaac, MD   2 Units at 06/04/20 1703   lisinopril (ZESTRIL) tablet 20 mg  20 mg Oral Daily Gold, Wayne E, PA-C   20 mg at 06/04/20 0949   magnesium hydroxide (MILK OF MAGNESIA) suspension 30 mL  30 mL Oral Daily PRN Grace Isaac, MD  magnesium oxide (MAG-OX) tablet 400 mg  400 mg Oral BID Gold, Wayne E, PA-C   400 mg at 06/04/20 2131   MEDLINE mouth rinse  15 mL Mouth Rinse q12n4p Grace Isaac, MD   15 mL at 06/04/20 1650   metoprolol tartrate (LOPRESSOR) tablet 12.5 mg  12.5 mg Oral BID Grace Isaac, MD   12.5 mg at 06/04/20 2131   ondansetron (ZOFRAN) tablet 4 mg  4 mg Oral Q6H PRN Grace Isaac, MD       Or   ondansetron [facility]) injection 4 mg  4 mg Intravenous Q6H PRN Grace Isaac, MD       opium-belladonna (B&O) suppository 16.2-60mg   1 suppository Rectal Q8H PRN Ardis Hughs, MD   1 suppository at 06/05/20 0552   pantoprazole (PROTONIX) EC tablet 40 mg  40 mg Oral QAC breakfast Grace Isaac, MD   40 mg at 06/04/20 0749   sodium chloride flush (NS) 0.9 % injection 3 mL  3 mL Intravenous Q12H Grace Isaac, MD   3 mL at 06/04/20 2144   sodium chloride flush (NS) 0.9 % injection 3 mL  3 mL  Intravenous PRN Grace Isaac, MD       traMADol Veatrice Bourbon) tablet 50-100 mg  50-100 mg Oral Q4H PRN Grace Isaac, MD   100 mg at 06/05/20 0439     Objective: Vital: Vitals:   06/04/20 2130 06/05/20 0012 06/05/20 0449 06/05/20 0632  BP: (!) 166/80 128/66 (!) 154/71   Pulse: 65 60    Resp:  19 (!) 23   Temp:  97.9 F (36.6 C) 98.1 F (36.7 C)   TempSrc:  Oral Oral   SpO2:  94% 95%   Weight:    200 lb 6.4 oz (90.9 kg)  Height:       I/Os: I/O last 3 completed shifts: In: 560 [P.O.:240; Other:320] Out: 1776 [QJJHE:1740; Stool:1]  Physical Exam:  General: Patient is in no apparent distress Lungs: Normal respiratory effort, chest expands symmetrically. GI: soft Foley: straw colored urine. Ext: lower extremities symmetric  Lab Results: No results for input(s): WBC, HGB, HCT in the last 72 hours. Recent Labs    06/02/20 0824  NA 140  K 3.9  CL 106  CO2 25  GLUCOSE 102*  BUN 18  CREATININE 0.84  CALCIUM 8.3*   No results for input(s): LABPT, INR in the last 72 hours. No results for input(s): LABURIN in the last 72 hours. Results for orders placed or performed during the hospital encounter of 05/27/20  Surgical pcr screen     Status: None   Collection Time: 05/28/20  6:43 PM   Specimen: Nasal Mucosa; Nasal Swab  Result Value Ref Range Status   MRSA, PCR NEGATIVE NEGATIVE Final   Staphylococcus aureus NEGATIVE NEGATIVE Final    Comment: (NOTE) The Xpert SA Assay (FDA approved for NASAL specimens in patients 108 years of age and older), is one component of a comprehensive surveillance program. It is not intended to diagnose infection nor to guide or monitor treatment. Performed at [facility], [place] [address] [place], [place] [address]     Studies/Results: No results found.  Assessment: Procedure(s): CORONARY ARTERY BYPASS GRAFTING (CABG) TIMES THREE USING LIMA to LAD; ENDOSCOPIC HARVESTED RIGHT GREATER SAPHENOUS VEIN: SVG to PD; SVG to  RAMUS. TRANSESOPHAGEAL ECHOCARDIOGRAM (TEE) ENDOVEIN HARVEST OF GREATER SAPHENOUS VEIN, 7 Days Post-Op  doing well.  Bladder spasms and persistent hematuria.  Plan:  Discussed options for the patient including inserting a bigger foley and irrigating any remaining clots vs. Leaving current foley one more day and removing it Friday AM as planned.  Based on color of urine and symptoms I don't think he has on-going bleeding and just old clots in his bladder.  The clots and the foley balloon likely creating irritation resulting in bladder spasms.  Together we opted  to leave catheter and get through one more day.  D/c catheter this AM.  Would subsequently closely monitor UOP and symptoms.  If he fails, he can either learn CIC or have another catheter placed and f/u in [facility] for a voiding trial next week.  PAtient doesn't need a hematuria eval - he had a cystoscopy at [facility] several months prior and his bladder was essentially normal - findings consistent with enlarged obstructing prostate.   PAtient to return to see Dr. Diamantina Providence at [facility]) who has been updated and is already aware of his current urologic circumstance.  My apologies for not getting this note done yesterday.  Please page or call with any additional questions or concerns.   Louis Meckel, MD Urology 06/05/2020, 8:15 AM

## 2020-06-04 NOTE — Progress Notes (Signed)
0050: called to pt's room stating he is leaking again, charge nurse aware. When checked pt had small amount of urine leaked around foley, foley bag had 50 cc pink tinged urine in the bag. Pt was mashing around the bladder and was saying "nothing  is happening when I press down ".  And stated its tender when he pushes down which he thinks is little more tender than during the day. Instructed pt not to mash down, pt thinks he is trying to urinate but its not coming out Explained pt that I will scan the bladder, flush it one more time and if he is still having problem, I will notify urology again. Pt agreed.   0100: bladder scan showed 148 cc. Flushed with 60 cc normal saline with new sterile syringe, small visible clots came out. After that pt urinated 140 cc. Flushed with additional 60 and 40 cc respectively with tiny clots coming out. Total flush was 160 and output was 300 cc . Connected to new foley bag. Pt tolerated well, pt stated he doesn't feel the pressure anymore. Small amount of pink tinged urine visible in the tubing.  Instructed pt that he has not had  Much water  nor he has fluids running to make large amount of urine. Apple juice given per pt's request. Instructed pt to call back if it still leaks or if he has spasms, and we will get in touch with urology again. Pt voiced understanding. Will continue to monitor.

## 2020-06-04 NOTE — Progress Notes (Signed)
At shift change report pt was in bathroom, bed was wet. Pt stated it felt like he had to urinate and when he stood up foley leaked . He still had some pink tinged urine in the bag and tubing. Foley care /pericare given, pt was sitting in the recliner, foley was still draining with small visible clots in the tubing . Instructed pt to call back if that happens again.About 40 min later, pt called back again and said foley leaked around and his gown is wet again. HE stated, " I want someone from urology right now". Pt seemed very anxious and upset, stated he is tired of the issue. Pt stated he felt little pain , that went away when urine leaked out.   2117: This RN called on call urology Dr. Ermelinda Das . MD stated disconnect the foley and flush with 60 cc normal saline, not more than 180 cc at once. Can repeat the procedure as needed. MD stated its common to leak around cathter with bladder spasms, and if foley is dranning some its ok.   2140: explained the pt about the procedure, pt was upset and frustrated. Pt was hoping urology will come and see him at bedside. Charge nurse was in the room with this RN, bladder scan showed 0cc. Pt had 300 cc of pink tinged urine in the foley bag. Disconnected the foley at seal, blood clot was visible at connection site, flushed cathter with 60 cc normal saline, followed by 40 cc, pt stated he felt little pain so I stopped. 100 cc of saline came back . Connected to new foley bag. Pt stated he doesn't need medication for spasm. Foley was draning small amount of pink tinged urine. Instructed pt to call if it leaks around again.  Will continue to monitor.

## 2020-06-04 NOTE — Progress Notes (Signed)
Pt w/ complaints of pain and bladder spasms. Foley was flushed x1 at 6 PM w/ 60 cc's of NS. Pink tinged urine returned w/ a few blood clots. Foley was checked at 7 pm w/ 175 cc's of red urine in foley bag. Pt still uncomfortable and says he "feels like he has to pee". Foley flushed again w/ 60 cc's of NS w/ no urine return. Pt "forced himself to pee, urine leaked around the catheter at the insertion site, and then urine being to flow into the bag.

## 2020-06-04 NOTE — Progress Notes (Signed)
Mobility Specialist - Progress Note   06/04/20 1414  Mobility  Activity Ambulated in hall  Level of Assistance Modified independent, requires aide device or extra time  Assistive Device Front wheel walker  Distance Ambulated (ft) 1200 ft  Mobility Response Tolerated well  Mobility performed by Mobility specialist  $Mobility charge 1 Mobility    Pre-mobility: 63 HR During mobility: 67 HR Post-mobility: 62 HR  Pt asx throughout ambulation.   Pricilla Handler Mobility Specialist Mobility Specialist Phone: [phone]

## 2020-06-05 LAB — GLUCOSE, CAPILLARY
Glucose-Capillary: 93 mg/dL (ref 70–99)
Glucose-Capillary: 120 mg/dL — ABNORMAL HIGH (ref 70–99)

## 2020-06-05 MED ORDER — AMIODARONE HCL 200 MG PO TABS: 200.0000 mg | ORAL_TABLET | Freq: Two times a day (BID) | ORAL | Status: DC

## 2020-06-05 NOTE — Progress Notes (Signed)
Pt's Foley catheter removed this morning per MD order.  Pt tolerated well.  Pt urinated shortly after.  Urine still bloody with some clots.

## 2020-06-05 NOTE — Progress Notes (Signed)
Pt experiencing on and off 8/10 burning spasms in his genital area. Pt states that after the pain subsides urine floods out around his catheter and down his leg. Urine is tinged red with occasional small clots. Pt states B&O suppository helps reduce the pain and frequency of the spasms but that they do not go away. Will continue to monitor. Adella Hare, RN

## 2020-06-05 NOTE — Progress Notes (Addendum)
[name]       [place] [address]             [phone]      7 Days Post-Op Procedure(s) (LRB): CORONARY ARTERY BYPASS GRAFTING (CABG) TIMES THREE USING LIMA to LAD; ENDOSCOPIC HARVESTED RIGHT GREATER SAPHENOUS VEIN: SVG to PD; SVG to RAMUS. (N/A) TRANSESOPHAGEAL ECHOCARDIOGRAM (TEE) (N/A) ENDOVEIN HARVEST OF GREATER SAPHENOUS VEIN (Right) Subjective: Frustrated to some degree with prostate/foley issues- appreciate urology F/U  Objective: Vital signs in last 24 hours: Temp:  [97.9 F (36.6 C)-98.8 F (37.1 C)] 98.1 F (36.7 C) (11/12 0449) Pulse Rate:  [54-65] 60 (11/12 0012) Cardiac Rhythm: Normal sinus rhythm (11/11 2025) Resp:  [18-23] 23 (11/12 0449) BP: (116-168)/(66-82) 154/71 (11/12 0449) SpO2:  [94 %-97 %] 95 % (11/12 0449) Weight:  [90.9 kg] 90.9 kg (11/12 0632)  Hemodynamic parameters for last 24 hours:    Intake/Output from previous day: 11/11 0701 - 11/12 0700 In: 180 [P.O.:120] Out: 826 [Urine:825; Stool:1] Intake/Output this shift: No intake/output data recorded.  General appearance: alert, cooperative and no distress Heart: regular rate and rhythm Lungs: clear to auscultation bilaterally Abdomen: benign Extremities: no edema  Wound: incis healing well  Lab Results: No results for input(s): WBC, HGB, HCT, PLT in the last 72 hours. BMET: Recent Labs    06/02/20 0824  NA 140  K 3.9  CL 106  CO2 25  GLUCOSE 102*  BUN 18  CREATININE 0.84  CALCIUM 8.3*    PT/INR: No results for input(s): LABPROT, INR in the last 72 hours. ABG    Component Value Date/Time   PHART 7.356 05/29/2020 1802   HCO3 19.9 (L) 05/29/2020 1802   TCO2 21 (L) 05/29/2020 1802   ACIDBASEDEF 5.0 (H) 05/29/2020 1802   O2SAT 97.0 05/29/2020 1802   CBG (last 3)  Recent Labs    06/04/20 1637 06/04/20 2157 06/05/20 0629  GLUCAP 144* 105* 93    Meds Scheduled Meds: . alfuzosin  10 mg Oral QHS  . amiodarone  400 mg Oral BID  . aspirin EC   325 mg Oral Daily  . atorvastatin  80 mg Oral q1800  . chlorhexidine  15 mL Mouth Rinse BID  . Chlorhexidine Gluconate Cloth  6 each Topical Daily  . docusate sodium  200 mg Oral Daily  . enoxaparin (LOVENOX) injection  40 mg Subcutaneous QHS  . finasteride  5 mg Oral QHS  . icosapent Ethyl  2 g Oral BID  . insulin aspart  0-24 Units Subcutaneous TID AC & HS  . lisinopril  20 mg Oral Daily  . magnesium oxide  400 mg Oral BID  . mouth rinse  15 mL Mouth Rinse q12n4p  . metoprolol tartrate  12.5 mg Oral BID  . pantoprazole  40 mg Oral QAC breakfast  . sodium chloride flush  3 mL Intravenous Q12H   Continuous Infusions: . sodium chloride     PRN Meds:.sodium chloride, acetaminophen, alum & mag hydroxide-simeth, bisacodyl **OR** bisacodyl, diphenhydrAMINE-zinc acetate, guaiFENesin, magnesium hydroxide, ondansetron **OR** ondansetron (ZOFRAN) IV, opium-belladonna, sodium chloride flush, traMADol  Xrays No results found.  Assessment/Plan: S/P Procedure(s) (LRB): CORONARY ARTERY BYPASS GRAFTING (CABG) TIMES THREE USING LIMA to LAD; ENDOSCOPIC HARVESTED RIGHT GREATER SAPHENOUS VEIN: SVG to PD; SVG to RAMUS. (N/A) TRANSESOPHAGEAL ECHOCARDIOGRAM (TEE) (N/A) ENDOVEIN HARVEST OF GREATER SAPHENOUS VEIN (Right)  1 afeb, VSS except some HTN at times, may need additional agent  2 maintaining sinus rhythm. Sinus brady,  will keep current beta blocker dose for now 3 sats good on RA 4 conts to have some difficulty with catheter- will remove today and see how voiding goes- appreciate Dr Herrick's care 5 poss home 1-2 days      LOS: 9 days    Clayton Giovanni PA-C Pager [phone] 06/05/2020   situation reviewed by urology- to remove foley today , consider home tomorrow if voiding without difficult  I have seen and examined Clayton Powell and agree with the above assessment  and plan.  Grace Isaac MD Beeper [phone] Office [phone] 06/05/2020 10:34 AM

## 2020-06-05 NOTE — Progress Notes (Signed)
Pt HR consistently in the 50s, occasionally dropping as low as 49 bpm. HR currently 53 bpm. Paged MD Roxan Hockey about nightly Amiodarone. MD Roxan Hockey gave verbal order to hold nightly Amiodarone. Will continue to monitor. Adella Hare, RN

## 2020-06-05 NOTE — Hospital Course (Signed)
    History of Present Illness:     Patient noted new onset of epigastric discomfort over the past several weeks.  First noted as he was walking across the parking lot to work.  Since he first noticed it, increasingly obvious.  Earlier this week he called his primary care doctor describing the symptoms and was referred to urgent care.  Ultimately he was admitted to [facility] troponin was very minimally elevated.  Yesterday he underwent cardiac catheterization by Dr. Nehemiah Massed, this revealed significant three-vessel disease.  Today he was transferred to [facility] for consideration of coronary artery bypass grafting.    He has a past  medical history for of coronary disease is based on coronary calcium score markedly elevated in 2012.  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   he has known history of dyslipidemia specifically elevated triglycerides. , hypertension, and gastroesophageal reflux.  He did have a normal stress test in 2010.     He presented to the ER with substernal chest pain that was noted to be worse with exertion.  He was admitted with slightly elevated troponin levels for further evaluation and treatment.  An echocardiogram showed normal LV ejection fraction of 60% with no evidence of significant valvular heart disease.     Patient was medically stabilized and transferred to [facility] for cardiothoracic surgical evaluation for consideration of coronary artery surgical revascularization.  He was seen in consultation by Dr. Servando Snare who evaluated the patient and his studies and recommended proceeding with CABG.    Hospital Course: The patient was transferred from [place] [facility] to [facility] for definitive surgical intervention.  On 05/29/2020 he was taken to the operating room where he underwent the below described procedure.  He tolerated it well was taken to the surgical intensive care unit in stable condition.   Postoperative  hospital course:   Patient has done well.  He was extubated using standard cardiac surgical protocols without difficulty.  He has remained neurologically intact.  He does have an expected acute blood loss anemia which is mild.  Renal function has remained within normal limits.  He has had some urinary retention in the early postoperative period and was resumed on his preoperative medications for BPH.  He was also seen by urology due to the fact that the Foley catheter had to be replaced.  He developed spasms in his urinary tract and was started on the B&O suppositories and his symptoms have improved.  He developed postoperative atrial fibrillation but has subsequently been chemically cardioverted to sinus rhythm with amiodarone

## 2020-06-05 NOTE — Progress Notes (Signed)
Mobility Specialist - Progress Note   06/05/20 1125  Mobility  Activity Ambulated in hall  Level of Assistance Standby assist, set-up cues, supervision of patient - no hands on  Assistive Device Front wheel walker  Distance Ambulated (ft) 400 ft  Mobility Response Tolerated well  Mobility performed by Mobility specialist  $Mobility charge 1 Mobility    Pre-mobility: 68 HR During mobility: 68 HR Post-mobility: 62 HR  Pt limited ambulation distance as he is fatigued from not sleeping well last night. He was asx throughout ambulation.  Pricilla Handler Mobility Specialist Mobility Specialist Phone: [phone]

## 2020-06-05 NOTE — Progress Notes (Signed)
Pt became bradycardic in the 50's with occasional dips into high 40's HR.  Upon assessment Pt was feeling lightheaded and was diaphoretic, but A&O X4 and neuro intact.  All other vitals within normal range. PA notified and verbal orders placed for amiodarone reduction to 200 mg BID and to hold Lopressor with HR less than 60 BPM.  Pt will be closely monitored.

## 2020-06-06 NOTE — Progress Notes (Addendum)
[address]       [place] [address]             [phone]      8 Days Post-Op Procedure(s) (LRB): CORONARY ARTERY BYPASS GRAFTING (CABG) TIMES THREE USING LIMA to LAD; ENDOSCOPIC HARVESTED RIGHT GREATER SAPHENOUS VEIN: SVG to PD; SVG to RAMUS. (N/A) TRANSESOPHAGEAL ECHOCARDIOGRAM (TEE) (N/A) ENDOVEIN HARVEST OF GREATER SAPHENOUS VEIN (Right) Subjective:  some issues with bradycardia- meds have been adjusted  Objective: Vital signs in last 24 hours: Temp:  [97.6 F (36.4 C)-98.7 F (37.1 C)] 98.1 F (36.7 C) (11/13 0513) Pulse Rate:  [52-66] 63 (11/13 0513) Cardiac Rhythm: Sinus bradycardia (11/12 2006) Resp:  [15-20] 20 (11/13 0636) BP: (98-144)/(64-74) 137/72 (11/13 0513) SpO2:  [93 %-95 %] 95 % (11/13 0513) Weight:  [90.9 kg] 90.9 kg (11/13 0636)  Hemodynamic parameters for last 24 hours:    Intake/Output from previous day: 11/12 0701 - 11/13 0700 In: 290 [P.O.:120; I.V.:120] Out: 335 [Urine:335] Intake/Output this shift: No intake/output data recorded.  General appearance: alert, cooperative and no distress Heart: regular rate and rhythm Lungs: min dim in bases Abdomen: benign Extremities: no edema Wound: incis healing well   Lab Results: No results for input(s): WBC, HGB, HCT, PLT in the last 72 hours. BMET: No results for input(s): NA, K, CL, CO2, GLUCOSE, BUN, CREATININE, CALCIUM in the last 72 hours.  PT/INR: No results for input(s): LABPROT, INR in the last 72 hours. ABG    Component Value Date/Time   PHART 7.356 05/29/2020 1802   HCO3 19.9 (L) 05/29/2020 1802   TCO2 21 (L) 05/29/2020 1802   ACIDBASEDEF 5.0 (H) 05/29/2020 1802   O2SAT 97.0 05/29/2020 1802   CBG (last 3)  Recent Labs    06/04/20 2157 06/05/20 0629 06/05/20 1411  GLUCAP 105* 93 120*    Meds Scheduled Meds: . alfuzosin  10 mg Oral QHS  . aspirin EC  325 mg Oral Daily  . atorvastatin  80 mg Oral q1800  . chlorhexidine  15 mL Mouth Rinse BID  .  Chlorhexidine Gluconate Cloth  6 each Topical Daily  . docusate sodium  200 mg Oral Daily  . enoxaparin (LOVENOX) injection  40 mg Subcutaneous QHS  . finasteride  5 mg Oral QHS  . icosapent Ethyl  2 g Oral BID  . lisinopril  20 mg Oral Daily  . magnesium oxide  400 mg Oral BID  . mouth rinse  15 mL Mouth Rinse q12n4p  . metoprolol tartrate  12.5 mg Oral BID  . pantoprazole  40 mg Oral QAC breakfast  . sodium chloride flush  3 mL Intravenous Q12H   Continuous Infusions: . sodium chloride     PRN Meds:.sodium chloride, acetaminophen, alum & mag hydroxide-simeth, bisacodyl **OR** bisacodyl, diphenhydrAMINE-zinc acetate, guaiFENesin, magnesium hydroxide, ondansetron **OR** ondansetron (ZOFRAN) IV, opium-belladonna, sodium chloride flush, traMADol  Xrays No results found.  Assessment/Plan: S/P Procedure(s) (LRB): CORONARY ARTERY BYPASS GRAFTING (CABG) TIMES THREE USING LIMA to LAD; ENDOSCOPIC HARVESTED RIGHT GREATER SAPHENOUS VEIN: SVG to PD; SVG to RAMUS. (N/A) TRANSESOPHAGEAL ECHOCARDIOGRAM (TEE) (N/A) ENDOVEIN HARVEST OF GREATER SAPHENOUS VEIN (Right)  1 afeb, VSS except some symptomatic bradycardia- will stop lopressor and hold amio for now 2 sats ok on ra 3 appears to be voiding ok without foley, no further clots but red in color 4 BP control is reasonable, will hold on additional agent for now 5 hopefully home in am     LOS:  10 days    [name] PA-C Pager [phone] 06/06/2020 Patient seen and examined, agree with plan as outlined above  Remo Lipps C. Roxan Hockey, MD [facility] [phone]

## 2020-06-06 NOTE — Progress Notes (Signed)
Mobility Specialist: Progress Note   06/06/20 1141  Mobility  Activity Ambulated in hall  Level of [place] wheel walker  Distance Ambulated (ft) 430 ft  Mobility Response Tolerated well  Mobility performed by Mobility specialist  Bed Position Chair  $Mobility charge 1 Mobility   Pre-Mobility: 64 HR, 121/51 BP, 91% SpO2 Post-Mobility: 71 HR, 124/66 BP, 90% SpO2  Pt c/o feeling a little "foggy" in his head today. Pt c/o feeling slightly SOB during ambulation as well. I was unable to get a reading on the pulse oximeter during ambulation. Pt's sats maintaining 90-92% while resting in chair, RN notified.   [facility] Clayton Powell Mobility Specialist

## 2020-06-06 NOTE — Progress Notes (Signed)
CARDIAC REHAB PHASE I   PRE:  Rate/Rhythm: 73 SB  MODE:  Ambulation: 800 ft   POST:  Rate/Rhythm: 74 SR  BP:  Sitting: 120/66   Pt ambulated 826ft in hallway standby assist with front wheel walker. Pt denies pain, dizziness, or SOB. Pt returned to recliner. Call bell and phone within reach. Encouraged continued ambulation and IS use. Hopeful for home tomorrow.  [phone] Rufina Falco, RN BSN 06/06/2020 9:25 AM

## 2020-06-07 ENCOUNTER — Other Ambulatory Visit: Payer: Self-pay | Admitting: Surgical

## 2020-06-07 MED ORDER — BELLADONNA ALKALOIDS-OPIUM 16.2-60 MG RE SUPP: 1.0000 | Freq: Three times a day (TID) | RECTAL | 0 refills | Status: DC | PRN

## 2020-06-07 MED ORDER — AMLODIPINE BESYLATE 5 MG PO TABS
5.0000 mg | ORAL_TABLET | Freq: Every day | ORAL | Status: DC
  Administered 2020-06-07: 5 mg via ORAL
  Filled 2020-06-07: qty 1

## 2020-06-07 MED ORDER — GUAIFENESIN ER 600 MG PO TB12
600.0000 mg | ORAL_TABLET | Freq: Two times a day (BID) | ORAL | 0 refills | Status: DC
Start: 2020-06-07 — End: 2020-06-25

## 2020-06-07 MED ORDER — LISINOPRIL 20 MG PO TABS
20.0000 mg | ORAL_TABLET | Freq: Every day | ORAL | 1 refills | Status: DC
Start: 2020-06-07 — End: 2020-07-30

## 2020-06-07 MED ORDER — AMLODIPINE BESYLATE 5 MG PO TABS
5.0000 mg | ORAL_TABLET | Freq: Every day | ORAL | 1 refills | Status: DC
Start: 2020-06-07 — End: 2020-06-25

## 2020-06-07 MED ORDER — ASPIRIN 325 MG PO TBEC
325.0000 mg | DELAYED_RELEASE_TABLET | Freq: Every day | ORAL | Status: DC
Start: 2020-06-07 — End: 2020-07-30

## 2020-06-07 MED ORDER — AMLODIPINE BESYLATE 5 MG PO TABS
5.0000 mg | ORAL_TABLET | Freq: Every day | ORAL | 1 refills | Status: DC
Start: 2020-06-07 — End: 2020-06-07

## 2020-06-07 MED ORDER — TRAMADOL HCL 50 MG PO TABS: 50.0000 mg | ORAL_TABLET | Freq: Four times a day (QID) | ORAL | 0 refills | Status: DC | PRN

## 2020-06-07 MED ORDER — LISINOPRIL 20 MG PO TABS
20.0000 mg | ORAL_TABLET | Freq: Every day | ORAL | 1 refills | Status: DC
Start: 2020-06-07 — End: 2020-06-07

## 2020-06-07 MED ORDER — ATORVASTATIN CALCIUM 80 MG PO TABS
80.0000 mg | ORAL_TABLET | Freq: Every day | ORAL | 1 refills | Status: DC
Start: 2020-06-07 — End: 2020-07-30

## 2020-06-07 NOTE — Progress Notes (Signed)
Pt discharged today to home with wife.  Pt taken off telemetry and CCMD notified.  Pt's IV removed.  Pt left with all of their personal belongings.  AVS documentation reviewed with Pt and wife and all questions answered.

## 2020-06-07 NOTE — Progress Notes (Signed)
Resent RX to patients pharmacy electronically. Not clear why they were note received before  [name], PA-C

## 2020-06-07 NOTE — Progress Notes (Signed)
Mobility Specialist: Progress Note   06/07/20 1137  Mobility  Activity Ambulated in hall  Level of Assistance Independent  Assistive Device Front wheel walker  Distance Ambulated (ft) 470 ft  Mobility Response Tolerated well  Mobility performed by Mobility specialist  Bed Position Chair  $Mobility charge 1 Mobility   Pre-Mobility: 70 HR, 127/72 BP, 94% SpO2 Post-Mobility: 76 HR, 140/68 BP, 97% SpO2  Pt said he felt a little "foggy" again today. Pt said he didn't sleep well last night either. Pt is waiting to be discharged.   [facility] Clayton Powell Mobility Specialist

## 2020-06-07 NOTE — Progress Notes (Addendum)
[address]       [place] [address]             [phone]      9 Days Post-Op Procedure(s) (LRB): CORONARY ARTERY BYPASS GRAFTING (CABG) TIMES THREE USING LIMA to LAD; ENDOSCOPIC HARVESTED RIGHT GREATER SAPHENOUS VEIN: SVG to PD; SVG to RAMUS. (N/A) TRANSESOPHAGEAL ECHOCARDIOGRAM (TEE) (N/A) ENDOVEIN HARVEST OF GREATER SAPHENOUS VEIN (Right) Subjective: conts to improve  Objective: Vital signs in last 24 hours: Temp:  [97.9 F (36.6 C)-98.4 F (36.9 C)] 98.3 F (36.8 C) (11/14 0518) Pulse Rate:  [57-67] 65 (11/14 0518) Cardiac Rhythm: Normal sinus rhythm (11/13 1859) Resp:  [13-18] 17 (11/14 0518) BP: (121-159)/(51-82) 151/80 (11/14 0518) SpO2:  [93 %-96 %] 95 % (11/14 0518) Weight:  [89.3 kg] 89.3 kg (11/14 0518)  Hemodynamic parameters for last 24 hours:    Intake/Output from previous day: 11/13 0701 - 11/14 0700 In: 540 [P.O.:540] Out: -  Intake/Output this shift: No intake/output data recorded.  General appearance: alert, cooperative and no distress Heart: regular rate and rhythm Lungs: clear to auscultation bilaterally Abdomen: benign Extremities: no edema Wound: incis healing well  Lab Results: No results for input(s): WBC, HGB, HCT, PLT in the last 72 hours. BMET: No results for input(s): NA, K, CL, CO2, GLUCOSE, BUN, CREATININE, CALCIUM in the last 72 hours.  PT/INR: No results for input(s): LABPROT, INR in the last 72 hours. ABG    Component Value Date/Time   PHART 7.356 05/29/2020 1802   HCO3 19.9 (L) 05/29/2020 1802   TCO2 21 (L) 05/29/2020 1802   ACIDBASEDEF 5.0 (H) 05/29/2020 1802   O2SAT 97.0 05/29/2020 1802   CBG (last 3)  Recent Labs    06/04/20 2157 06/05/20 0629 06/05/20 1411  GLUCAP 105* 93 120*    Meds Scheduled Meds: . alfuzosin  10 mg Oral QHS  . aspirin EC  325 mg Oral Daily  . atorvastatin  80 mg Oral q1800  . chlorhexidine  15 mL Mouth Rinse BID  . Chlorhexidine Gluconate Cloth  6 each Topical  Daily  . docusate sodium  200 mg Oral Daily  . enoxaparin (LOVENOX) injection  40 mg Subcutaneous QHS  . finasteride  5 mg Oral QHS  . icosapent Ethyl  2 g Oral BID  . lisinopril  20 mg Oral Daily  . magnesium oxide  400 mg Oral BID  . mouth rinse  15 mL Mouth Rinse q12n4p  . pantoprazole  40 mg Oral QAC breakfast  . sodium chloride flush  3 mL Intravenous Q12H   Continuous Infusions: . sodium chloride     PRN Meds:.sodium chloride, acetaminophen, alum & mag hydroxide-simeth, bisacodyl **OR** bisacodyl, diphenhydrAMINE-zinc acetate, guaiFENesin, magnesium hydroxide, ondansetron **OR** ondansetron (ZOFRAN) IV, opium-belladonna, sodium chloride flush, traMADol  Xrays No results found.  Assessment/Plan: S/P Procedure(s) (LRB): CORONARY ARTERY BYPASS GRAFTING (CABG) TIMES THREE USING LIMA to LAD; ENDOSCOPIC HARVESTED RIGHT GREATER SAPHENOUS VEIN: SVG to PD; SVG to RAMUS. (N/A) TRANSESOPHAGEAL ECHOCARDIOGRAM (TEE) (N/A) ENDOVEIN HARVEST OF GREATER SAPHENOUS VEIN (Right)  1 afeb, VSS, BP remains elevated, will ad norvasc as is more persistent 2 sats good on RA 3 conts to be able to void, some blood tinge, no clots 4 no new labs 5 will not resume amio/beta blocker at this time- has appt with cardiology on tuesday 6 stable for d/c  LOS: 11 days    [name] PA-C Pager [phone] 06/07/2020 Patient seen and examined, agree  with above OK for [place]. Roxan Hockey, MD [facility] [phone]

## 2020-06-07 NOTE — Progress Notes (Signed)
Pt's chest tube sutures removed per PA verbal order.  Steri-strips applied to mid incision, lateral incision did not need steri-strip.  Gauze dressing applied to both.

## 2020-06-09 ENCOUNTER — Telehealth: Payer: Self-pay

## 2020-06-09 NOTE — Telephone Encounter (Signed)
FMLA/Attending MD Statement completed and faxed to the [employer] @ [phone]. Beginning leave 05/26/20 through 08/24/20. Original form mail to home address.

## 2020-06-24 ENCOUNTER — Other Ambulatory Visit: Payer: Self-pay | Admitting: Cardiothoracic Surgery

## 2020-06-24 DIAGNOSIS — Z951 Presence of aortocoronary bypass graft: Secondary | ICD-10-CM

## 2020-06-24 NOTE — Progress Notes (Addendum)
[address]       [place] [address]             [phone]      Bleu M Benefiel Popponesset Medical Record #[id number] Date of Birth: [birth year]  Referring: Tonia Ghent, MD Primary Care: Tonia Ghent, MD Primary Cardiologist: Corey Skains, MD   Chief Complaint:   POST OP FOLLOW UP OPERATIVE REPORT DATE OF PROCEDURE:  05/29/2020 PREOPERATIVE DIAGNOSIS:  Coronary occlusive disease with unstable angina. POSTOPERATIVE DIAGNOSIS:  Coronary occlusive disease with unstable angina. SURGICAL PROCEDURE:  Coronary artery bypass grafting x3 with the left internal mammary to the left anterior descending coronary artery, reverse saphenous vein graft to the ramus coronary artery, reverse saphenous vein graft to the distal right coronary  artery, with right greater saphenous endoscopic vein harvesting, right thigh and calf, and incidental lymph node biopsy of internal mammary left lymph node  History of Present Illness:     Patient returns to the office today for follow-up postop check.  He underwent coronary artery bypass grafting for unstable angina May 29, 2020.  His major postoperative difficulty was Foley catheter trauma, hematuria and urinary retention.  He notes that the symptoms have now resolved not having further difficulty. He is increasing his strength appropriately, is ready to enroll in cardiac rehab program.    Past Medical History:  Diagnosis Date  . Allergy   . Anginal pain ([place])   . BPH (benign prostatic hypertrophy) 6/07  . CAD (coronary artery disease) 2010   dx made by coronary calcium scoring, Normal stress test in 2010.   Marland Kitchen GERD (gastroesophageal reflux disease) 12/05  . HLD (hyperlipidemia) 3/99  . HTN (hypertension) 2002   no meds  . Left rotator cuff tear 08/12/2011   Per Dr. Mardelle Matte 2013 L rotator cuff tear on MRI 2013   . Right rotator cuff tear 05/24/2013  . Wears glasses      Social History   Tobacco Use  Smoking  Status Never Smoker  Smokeless Tobacco Never Used    Social History   Substance and Sexual Activity  Alcohol Use Yes  . Alcohol/week: 0.0 standard drinks   Comment: beer, occ     Allergies  Allergen Reactions  . Ciprofloxacin Hcl Rash  . Quinolones Rash    Current Outpatient Medications  Medication Sig Dispense Refill  . alfuzosin (UROXATRAL) 10 MG 24 hr tablet Take 1 tablet (10 mg total) by mouth daily with breakfast. 90 tablet 3  . aspirin EC 325 MG EC tablet Take 1 tablet (325 mg total) by mouth daily.    . Azelastine-Fluticasone 137-50 MCG/ACT SUSP Place 1 spray into both nostrils 2 (two) times daily.    . calcium citrate-vitamin D (CITRACAL+D) 315-200 MG-UNIT tablet Take 1-2 tablets by mouth 2 (two) times daily.    . Cholecalciferol (VITAMIN D) 2000 UNITS CAPS Take 1 capsule by mouth daily.      . clindamycin (CLEOCIN T) 1 % external solution Apply topically 2 (two) times daily as needed.    . Cyanocobalamin (VITAMIN B-12) 5000 MCG SUBL Place 1 tablet under the tongue daily.    . finasteride (PROSCAR) 5 MG tablet Take 1 tablet (5 mg total) by mouth daily. 90 tablet 3  . Icosapent Ethyl (VASCEPA) 1 G CAPS Take 1 tablet by mouth 4 (four) times daily.    Marland Kitchen lisinopril (ZESTRIL) 20 MG tablet Take 1 tablet (20 mg total) by mouth daily. 30 tablet  1  . Multiple Vitamin (MULTIVITAMIN) tablet Take 1 tablet by mouth daily.      . rosuvastatin (CRESTOR) 40 MG tablet Take 40 mg by mouth daily.    . traMADol (ULTRAM) 50 MG tablet Take 1 tablet (50 mg total) by mouth every 6 (six) hours as needed for moderate pain. 28 tablet 0  . atorvastatin (LIPITOR) 80 MG tablet Take 1 tablet (80 mg total) by mouth daily at 6 PM. 30 tablet 1  . opium-belladonna (B&O SUPPRETTES) 16.2-60 MG suppository Place 1 suppository rectally every 8 (eight) hours as needed for bladder spasms. 6 suppository 0   Current Facility-Administered Medications  Medication Dose Route Frequency Provider Last Rate Last Admin   . betamethasone acetate-betamethasone sodium phosphate (CELESTONE) injection 3 mg  3 mg Intramuscular Once Edrick Kins, DPM           Physical Exam: BP 109/73   Pulse 73   Resp 20   Ht 5\' 6"  (1.676 m)   Wt 192 lb (87.1 kg)   SpO2 97% Comment: RA  BMI 30.99 kg/m   General appearance: alert, cooperative and no distress Neurologic: intact Heart: regular rate and rhythm, S1, S2 normal, no murmur, click, rub or gallop Lungs: clear to auscultation bilaterally Abdomen: soft, non-tender; bowel sounds normal; no masses,  no organomegaly Extremities: extremities normal, atraumatic, no cyanosis or edema and Homans sign is negative, no sign of DVT Wound: Sternum is stable and well-healed, right pain Endo harvest sites also healing well   Diagnostic Studies & Laboratory data:     Recent Radiology Findings:  DG Chest 2 View  Result Date: 06/25/2020 CLINICAL DATA:  Status post CABG EXAM: CHEST - 2 VIEW COMPARISON:  Chest radiograph June 01, 2020 FINDINGS: Changes of prior median sternotomy and CABG. Heart size and mediastinal contours are within normal limits. Small left p pleural effusion with adjacent compressive atelectasis. Thoracic spondylosis. IMPRESSION: Small left pleural effusion with adjacent compressive atelectasis. Electronically Signed   By: Dahlia Bailiff MD   On: 06/25/2020 11:32   I have independently reviewed the above radiology studies  and reviewed the findings with the patient.   Recent Lab Findings: Lab Results  Component Value Date   WBC 9.5 06/01/2020   HGB 10.8 (L) 06/01/2020   HCT 32.9 (L) 06/01/2020   PLT 141 (L) 06/01/2020   GLUCOSE 102 (H) 06/02/2020   CHOL 143 05/26/2020   TRIG 141 05/26/2020   HDL 52 05/26/2020   LDLCALC 63 05/26/2020   ALT 29 05/25/2020   AST 34 05/25/2020   NA 140 06/02/2020   K 3.9 06/02/2020   CL 106 06/02/2020   CREATININE 0.84 06/02/2020   BUN 18 06/02/2020   CO2 25 06/02/2020   TSH 4.344 06/02/2020   INR 1.4 (H)  05/29/2020   HGBA1C 5.4 05/28/2020      Assessment / Plan:   #1 status post recent urgent coronary artery bypass grafting for unstable anginal symptoms and severe three-vessel coronary artery disease #2 resolving issues with urinary retention and Foley catheter trauma  The patient is making very good progress postoperatively.  He will contact the cardiac rehab program at [facility] and have the patient enrolled in postop cardiac rehab  Plan to see him back in 5 weeks  Patient has changed from Lipitor to Crestor  He will contact cardiology about reducing dose of ACE inhibitor- with occasional low bp  On current dose   Medication Changes: No orders of the defined types were  placed in this encounter.     Grace Isaac MD      [address] [place] [address] Office [phone]     06/29/2020 1:24 PM

## 2020-06-25 ENCOUNTER — Ambulatory Visit
Admission: RE | Admit: 2020-06-25 | Discharge: 2020-06-25 | Disposition: A | Discharge status: Home / Self Care | Payer: 59 | Source: Ambulatory Visit | Attending: Cardiothoracic Surgery | Admitting: Cardiothoracic Surgery

## 2020-06-25 ENCOUNTER — Ambulatory Visit (INDEPENDENT_AMBULATORY_CARE_PROVIDER_SITE_OTHER): Payer: Self-pay | Admitting: Cardiothoracic Surgery

## 2020-06-25 ENCOUNTER — Other Ambulatory Visit: Payer: Self-pay

## 2020-06-25 VITALS — BP 109/73 | HR 73 | Resp 20 | Ht 66.0 in | Wt 192.0 lb

## 2020-06-25 DIAGNOSIS — I2510 Atherosclerotic heart disease of native coronary artery without angina pectoris: Secondary | ICD-10-CM

## 2020-06-25 DIAGNOSIS — Z951 Presence of aortocoronary bypass graft: Secondary | ICD-10-CM

## 2020-06-29 ENCOUNTER — Encounter: Payer: 59 | Attending: Internal Medicine | Admitting: *Deleted

## 2020-06-29 ENCOUNTER — Other Ambulatory Visit: Payer: Self-pay

## 2020-06-29 ENCOUNTER — Encounter: Payer: Self-pay | Admitting: Cardiothoracic Surgery

## 2020-06-29 DIAGNOSIS — H9012 Conductive hearing loss, unilateral, left ear, with unrestricted hearing on the contralateral side: Secondary | ICD-10-CM | POA: Insufficient documentation

## 2020-06-29 DIAGNOSIS — Z951 Presence of aortocoronary bypass graft: Secondary | ICD-10-CM

## 2020-06-29 NOTE — Progress Notes (Signed)
Initial telephone orientation completed. Diagnosis can be found in [facility] 11/3. EP orientation scheduled for Tuesday 12/7 at 10am.

## 2020-06-30 ENCOUNTER — Other Ambulatory Visit: Payer: Self-pay | Admitting: Surgical

## 2020-06-30 ENCOUNTER — Encounter: Payer: 59 | Admitting: *Deleted

## 2020-06-30 VITALS — Ht 67.6 in | Wt 189.8 lb

## 2020-06-30 DIAGNOSIS — Z951 Presence of aortocoronary bypass graft: Secondary | ICD-10-CM | POA: Diagnosis present

## 2020-06-30 NOTE — Progress Notes (Signed)
Cardiac Individual Treatment Plan  Patient Details  Name: Clayton Powell MRN: [id number] Date of Birth: 02/19/1946 Referring Provider:     Cardiac Rehab from 06/30/2020 in [facility] Cardiac and Pulmonary Rehab  Referring Provider Serafina Royals MD      Initial Encounter Date:    Cardiac Rehab from 06/30/2020 in [facility] Cardiac and Pulmonary Rehab  Date 06/30/20      Visit Diagnosis: S/P CABG x 3  Patient's Home Medications on Admission:  Current Outpatient Medications:  .  alfuzosin (UROXATRAL) 10 MG 24 hr tablet, Take 1 tablet (10 mg total) by mouth daily with breakfast., Disp: 90 tablet, Rfl: 3 .  aspirin EC 325 MG EC tablet, Take 1 tablet (325 mg total) by mouth daily., Disp: , Rfl:  .  atorvastatin (LIPITOR) 80 MG tablet, Take 1 tablet (80 mg total) by mouth daily at 6 PM., Disp: 30 tablet, Rfl: 1 .  Azelastine-Fluticasone 137-50 MCG/ACT SUSP, Place 1 spray into both nostrils 2 (two) times daily., Disp: , Rfl:  .  calcium citrate-vitamin D (CITRACAL+D) 315-200 MG-UNIT tablet, Take 1-2 tablets by mouth 2 (two) times daily., Disp: , Rfl:  .  Cholecalciferol (VITAMIN D) 2000 UNITS CAPS, Take 1 capsule by mouth daily.  , Disp: , Rfl:  .  clindamycin (CLEOCIN T) 1 % external solution, Apply topically 2 (two) times daily as needed., Disp: , Rfl:  .  Cyanocobalamin (VITAMIN B-12) 5000 MCG SUBL, Place 1 tablet under the tongue daily., Disp: , Rfl:  .  finasteride (PROSCAR) 5 MG tablet, Take 1 tablet (5 mg total) by mouth daily., Disp: 90 tablet, Rfl: 3 .  Icosapent Ethyl (VASCEPA) 1 G CAPS, Take 1 tablet by mouth 4 (four) times daily., Disp: , Rfl:  .  lisinopril (ZESTRIL) 20 MG tablet, Take 1 tablet (20 mg total) by mouth daily., Disp: 30 tablet, Rfl: 1 .  Multiple Vitamin (MULTIVITAMIN) tablet, Take 1 tablet by mouth daily.  , Disp: , Rfl:  .  opium-belladonna (B&O SUPPRETTES) 16.2-60 MG suppository, Place 1 suppository rectally every 8 (eight) hours as needed for bladder spasms., Disp: 6  suppository, Rfl: 0 .  rosuvastatin (CRESTOR) 40 MG tablet, Take 40 mg by mouth daily., Disp: , Rfl:  .  traMADol (ULTRAM) 50 MG tablet, Take 1 tablet (50 mg total) by mouth every 6 (six) hours as needed for moderate pain., Disp: 28 tablet, Rfl: 0  Current Facility-Administered Medications:  .  betamethasone acetate-betamethasone sodium phosphate (CELESTONE) injection 3 mg, 3 mg, Intramuscular, Once, Edrick Kins, DPM  Past Medical History: Past Medical History:  Diagnosis Date  . Allergy   . Anginal pain ([place])   . BPH (benign prostatic hypertrophy) 6/07  . CAD (coronary artery disease) 2010   dx made by coronary calcium scoring, Normal stress test in 2010.   Marland Kitchen GERD (gastroesophageal reflux disease) 12/05  . HLD (hyperlipidemia) 3/99  . HTN (hypertension) 2002   no meds  . Left rotator cuff tear 08/12/2011   Per Dr. Mardelle Matte 2013 L rotator cuff tear on MRI 2013   . Right rotator cuff tear 05/24/2013  . Wears glasses     Tobacco Use: Social History   Tobacco Use  Smoking Status Never Smoker  Smokeless Tobacco Never Used    Labs: Recent Review Flowsheet Data    Labs for ITP Cardiac and Pulmonary Rehab Latest Ref Rng & Units 05/29/2020 05/29/2020 05/29/2020 05/29/2020 05/29/2020   Cholestrol 0 - 200 mg/dL - - - - -  LDLCALC 0 - 99 mg/dL - - - - -   HDL >40 mg/dL - - - - -   Trlycerides <150 mg/dL - - - - -   Hemoglobin A1c 4.8 - 5.6 % - - - - -   PHART 7.35 - 7.45 7.342(L) - 7.305(L) 7.363 7.356   PCO2ART 32 - 48 mmHg 40.7 - 48.4(H) 38.8 35.6   HCO3 20.0 - 28.0 mmol/L 22.0 - 24.2 22.2 19.9(L)   TCO2 22 - 32 mmol/L 23 21(L) 26 23 21(L)   ACIDBASEDEF 0.0 - 2.0 mmol/L 3.0(H) - 3.0(H) 3.0(H) 5.0(H)   O2SAT % 94.0 - 98.0 96.0 97.0       Exercise Target Goals: Exercise Program Goal: Individual exercise prescription set using results from initial 6 min walk test and THRR while considering  patient's activity barriers and safety.   Exercise Prescription Goal: Initial exercise  prescription builds to 30-45 minutes a day of aerobic activity, 2-3 days per week.  Home exercise guidelines will be given to patient during program as part of exercise prescription that the participant will acknowledge.   Education: Aerobic Exercise: - Group verbal and visual presentation on the components of exercise prescription. Introduces F.I.T.T principle from ACSM for exercise prescriptions.  Reviews F.I.T.T. principles of aerobic exercise including progression. Written material given at graduation.   Education: Resistance Exercise: - Group verbal and visual presentation on the components of exercise prescription. Introduces F.I.T.T principle from ACSM for exercise prescriptions  Reviews F.I.T.T. principles of resistance exercise including progression. Written material given at graduation.    Education: Exercise & Equipment Safety: - Individual verbal instruction and demonstration of equipment use and safety with use of the equipment.   Cardiac Rehab from 06/30/2020 in [facility] Cardiac and Pulmonary Rehab  Date 06/30/20  Educator [facility]  Instruction Review Code 1- Verbalizes Understanding      Education: Exercise Physiology & General Exercise Guidelines: - Group verbal and written instruction with models to review the exercise physiology of the cardiovascular system and associated critical values. Provides general exercise guidelines with specific guidelines to those with heart or lung disease.    Cardiac Rehab from 06/30/2020 in [facility] Cardiac and Pulmonary Rehab  Education need identified 06/29/20      Education: Flexibility, Balance, Mind/Body Relaxation: - Group verbal and visual presentation with interactive activity on the components of exercise prescription. Introduces F.I.T.T principle from ACSM for exercise prescriptions. Reviews F.I.T.T. principles of flexibility and balance exercise training including progression. Also discusses the mind body connection.  Reviews various relaxation  techniques to help reduce and manage stress (i.e. Deep breathing, progressive muscle relaxation, and visualization). Balance handout provided to take home. Written material given at graduation.   Activity Barriers & Risk Stratification:  Activity Barriers & Cardiac Risk Stratification - 06/30/20 1446      Activity Barriers & Cardiac Risk Stratification   Activity Barriers Incisional Pain;Deconditioning;Other (comment)    Comments previous bone spur surgery (cause pain on R leg from thigh to calf)    Cardiac Risk Stratification High           6 Minute Walk:  6 Minute Walk    Row Name 06/30/20 1445         6 Minute Walk   Phase Initial     Distance 1510 feet     Walk Time 6 minutes     # of Rest Breaks 0     MPH 2.86     METS 3.24     RPE  13     Perceived Dyspnea  0     VO2 Peak 11.33     Symptoms Yes (comment)     Comments bone spur/calf bothersome 6/10     Resting HR 70 bpm     Resting BP 134/72     Resting Oxygen Saturation  98 %     Exercise Oxygen Saturation  during 6 min walk 98 %     Max Ex. HR 119 bpm     Max Ex. BP 142/64     2 Minute Post BP 122/66            Oxygen Initial Assessment:   Oxygen Re-Evaluation:   Oxygen Discharge (Final Oxygen Re-Evaluation):   Initial Exercise Prescription:  Initial Exercise Prescription - 06/30/20 1400      Date of Initial Exercise RX and Referring Provider   Date 06/30/20    Referring Provider Serafina Royals MD      Treadmill   MPH 2.7    Grade 0.5    Minutes 15    METs 3.25      Elliptical   Level 1    Speed 2.9    Minutes 15    METs 3.2      T5 Nustep   Level 3    SPM 80    Minutes 15    METs 3.2      Prescription Details   Frequency (times per week) 3    Duration Progress to 30 minutes of continuous aerobic without signs/symptoms of physical distress      Intensity   THRR 40-80% of Max Heartrate 100-131    Ratings of Perceived Exertion 11-13    Perceived Dyspnea 0-4      Progression    Progression Continue to progress workloads to maintain intensity without signs/symptoms of physical distress.      Resistance Training   Training Prescription Yes    Weight 4 lb    Reps 10-15           Perform Capillary Blood Glucose checks as needed.  Exercise Prescription Changes:  Exercise Prescription Changes    Row Name 06/30/20 1400             Response to Exercise   Blood Pressure (Admit) 134/72       Blood Pressure (Exercise) 142/64       Blood Pressure (Exit) 122/66       Heart Rate (Admit) 70 bpm       Heart Rate (Exercise) 119 bpm       Heart Rate (Exit) 80 bpm       Oxygen Saturation (Admit) 98 %       Oxygen Saturation (Exercise) 98 %       Rating of Perceived Exertion (Exercise) 13       Perceived Dyspnea (Exercise) 0       Symptoms bone spur/calf bothersome 6/10              Exercise Comments:   Exercise Goals and Review:  Exercise Goals    Row Name 06/30/20 1448             Exercise Goals   Increase Physical Activity Yes       Intervention Provide advice, education, support and counseling about physical activity/exercise needs.;Develop an individualized exercise prescription for aerobic and resistive training based on initial evaluation findings, risk stratification, comorbidities and participant's personal goals.       Expected Outcomes Short Term: Attend rehab  on a regular basis to increase amount of physical activity.;Long Term: Add in home exercise to make exercise part of routine and to increase amount of physical activity.;Long Term: Exercising regularly at least 3-5 days a week.       Increase Strength and Stamina Yes       Intervention Provide advice, education, support and counseling about physical activity/exercise needs.;Develop an individualized exercise prescription for aerobic and resistive training based on initial evaluation findings, risk stratification, comorbidities and participant's personal goals.       Expected Outcomes  Short Term: Increase workloads from initial exercise prescription for resistance, speed, and METs.;Long Term: Improve cardiorespiratory fitness, muscular endurance and strength as measured by increased METs and functional capacity (6MWT);Short Term: Perform resistance training exercises routinely during rehab and add in resistance training at home       Able to understand and use rate of perceived exertion (RPE) scale Yes       Intervention Provide education and explanation on how to use RPE scale       Expected Outcomes Short Term: Able to use RPE daily in rehab to express subjective intensity level;Long Term:  Able to use RPE to guide intensity level when exercising independently       Able to understand and use Dyspnea scale Yes       Intervention Provide education and explanation on how to use Dyspnea scale       Expected Outcomes Short Term: Able to use Dyspnea scale daily in rehab to express subjective sense of shortness of breath during exertion;Long Term: Able to use Dyspnea scale to guide intensity level when exercising independently       Knowledge and understanding of Target Heart Rate Range (THRR) Yes       Intervention Provide education and explanation of THRR including how the numbers were predicted and where they are located for reference       Expected Outcomes Short Term: Able to state/look up THRR;Short Term: Able to use daily as guideline for intensity in rehab;Long Term: Able to use THRR to govern intensity when exercising independently       Able to check pulse independently Yes       Intervention Provide education and demonstration on how to check pulse in carotid and radial arteries.;Review the importance of being able to check your own pulse for safety during independent exercise       Expected Outcomes Short Term: Able to explain why pulse checking is important during independent exercise;Long Term: Able to check pulse independently and accurately       Understanding of Exercise  Prescription Yes       Intervention Provide education, explanation, and written materials on patient's individual exercise prescription       Expected Outcomes Short Term: Able to explain program exercise prescription;Long Term: Able to explain home exercise prescription to exercise independently              Exercise Goals Re-Evaluation :   Discharge Exercise Prescription (Final Exercise Prescription Changes):  Exercise Prescription Changes - 06/30/20 1400      Response to Exercise   Blood Pressure (Admit) 134/72    Blood Pressure (Exercise) 142/64    Blood Pressure (Exit) 122/66    Heart Rate (Admit) 70 bpm    Heart Rate (Exercise) 119 bpm    Heart Rate (Exit) 80 bpm    Oxygen Saturation (Admit) 98 %    Oxygen Saturation (Exercise) 98 %    Rating of  Perceived Exertion (Exercise) 13    Perceived Dyspnea (Exercise) 0    Symptoms bone spur/calf bothersome 6/10           Nutrition:  Target Goals: Understanding of nutrition guidelines, daily intake of sodium 1500mg , cholesterol 200mg , calories 30% from fat and 7% or less from saturated fats, daily to have 5 or more servings of fruits and vegetables.  Education: All About Nutrition: -Group instruction provided by verbal, written material, interactive activities, discussions, models, and posters to present general guidelines for heart healthy nutrition including fat, fiber, MyPlate, the role of sodium in heart healthy nutrition, utilization of the nutrition label, and utilization of this knowledge for meal planning. Follow up email sent as well. Written material given at graduation.   Biometrics:  Pre Biometrics - 06/30/20 1449      Pre Biometrics   Height 5' 7.6" (1.717 m)    Weight 189 lb 12.8 oz (86.1 kg)    BMI (Calculated) 29.2    Single Leg Stand 15.8 seconds            Nutrition Therapy Plan and Nutrition Goals:   Nutrition Assessments:  MEDIFICTS Score Key:  ?70 Need to make dietary changes   40-70  Heart Healthy Diet  ? 40 Therapeutic Level Cholesterol Diet    Cardiac Rehab from 06/30/2020 in [facility] Cardiac and Pulmonary Rehab  Picture Your Plate Total Score on Admission 26     Picture Your Plate Scores:  <41 Unhealthy dietary pattern with much room for improvement.  41-50 Dietary pattern unlikely to meet recommendations for good health and room for improvement.  51-60 More healthful dietary pattern, with some room for improvement.   >60 Healthy dietary pattern, although there may be some specific behaviors that could be improved.    Nutrition Goals Re-Evaluation:   Nutrition Goals Discharge (Final Nutrition Goals Re-Evaluation):   Psychosocial: Target Goals: Acknowledge presence or absence of significant depression and/or stress, maximize coping skills, provide positive support system. Participant is able to verbalize types and ability to use techniques and skills needed for reducing stress and depression.   Education: Stress, Anxiety, and Depression - Group verbal and visual presentation to define topics covered.  Reviews how body is impacted by stress, anxiety, and depression.  Also discusses healthy ways to reduce stress and to treat/manage anxiety and depression.  Written material given at graduation.   Education: Sleep Hygiene -Provides group verbal and written instruction about how sleep can affect your health.  Define sleep hygiene, discuss sleep cycles and impact of sleep habits. Review good sleep hygiene tips.    Initial Review & Psychosocial Screening:  Initial Psych Review & Screening - 06/29/20 1408      Initial Review   Current issues with None Identified      Family Dynamics   Good Support System? Yes   wife     Barriers   Psychosocial barriers to participate in program There are no identifiable barriers or psychosocial needs.;The patient should benefit from training in stress management and relaxation.      Screening Interventions   Interventions  Encouraged to exercise;To provide support and resources with identified psychosocial needs    Expected Outcomes Short Term goal: Utilizing psychosocial counselor, staff and physician to assist with identification of specific Stressors or current issues interfering with healing process. Setting desired goal for each stressor or current issue identified.;Long Term Goal: Stressors or current issues are controlled or eliminated.;Short Term goal: Identification and review with participant of any Quality  of Life or Depression concerns found by scoring the questionnaire.;Long Term goal: The participant improves quality of Life and PHQ9 Scores as seen by post scores and/or verbalization of changes           Quality of Life Scores:   Quality of Life - 06/29/20 1421      Quality of Life   Select Quality of Life      Quality of Life Scores   Health/Function Pre 24.13 %    Socioeconomic Pre 25.18 %    Psych/Spiritual Pre 25.35 %    Family Pre 19.2 %    GLOBAL Pre 23.91 %          Scores of 19 and below usually indicate a poorer quality of life in these areas.  A difference of  2-3 points is a clinically meaningful difference.  A difference of 2-3 points in the total score of the Quality of Life Index has been associated with significant improvement in overall quality of life, self-image, physical symptoms, and general health in studies assessing change in quality of life.  PHQ-9: Recent Review Flowsheet Data    Depression screen [facility] 2/9 06/30/2020 04/11/2017   Decreased Interest 0 0   Down, Depressed, Hopeless 0 0   PHQ - 2 Score 0 0   Altered sleeping 0 -   Tired, decreased energy 0 -   Change in appetite 0 -   Feeling bad or failure about yourself  0 -   Trouble concentrating 0 -   Moving slowly or fidgety/restless 0 -   Suicidal thoughts 0 -   PHQ-9 Score 0 -   Difficult doing work/chores Not difficult at all -     Interpretation of Total Score  Total Score Depression Severity:  1-4  = Minimal depression, 5-9 = Mild depression, 10-14 = Moderate depression, 15-19 = Moderately severe depression, 20-27 = Severe depression   Psychosocial Evaluation and Intervention:  Psychosocial Evaluation - 06/29/20 1418      Psychosocial Evaluation & Interventions   Interventions Encouraged to exercise with the program and follow exercise prescription    Comments Louie Casa 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 His incision is healing well and he is slowly progressing his activity. He is ready to get back to golf and full time at his job, but is taking it day by day. He is very health conscious and stays on top of what his body is feeling. He reports sleeping well and minimal stress. The most recent stressor is his daughter getting COVID, but is doing well recovering. He hopes to continue progressing on his health journey through cardiac rehab.    Expected Outcomes Short: attend cardiac rehab for education and exercise. Long: develop and maintain positive self care habits.    Continue Psychosocial Services  Follow up required by staff           Psychosocial Re-Evaluation:   Psychosocial Discharge (Final Psychosocial Re-Evaluation):   Vocational Rehabilitation: Provide vocational rehab assistance to qualifying candidates.   Vocational Rehab Evaluation & Intervention:  Vocational Rehab - 06/29/20 1401      Initial Vocational Rehab Evaluation & Intervention   Assessment shows need for Vocational Rehabilitation No           Education: Education Goals: Education classes will be provided on a variety of topics geared toward better understanding of heart health and risk factor modification. Participant will state understanding/return demonstration of topics presented as noted by education test scores.  Learning Barriers/Preferences:  Learning Barriers/Preferences - 06/29/20 1407      Learning Barriers/Preferences   Learning Barriers None    Learning Preferences None            General Cardiac Education Topics:  AED/CPR: - Group verbal and written instruction with the use of models to demonstrate the basic use of the AED with the basic ABC's of resuscitation.   Anatomy and Cardiac Procedures: - Group verbal and visual presentation and models provide information about basic cardiac anatomy and function. Reviews the testing methods done to diagnose heart disease and the outcomes of the test results. Describes the treatment choices: Medical Management, Angioplasty, or Coronary Bypass Surgery for treating various heart conditions including Myocardial Infarction, Angina, Valve Disease, and Cardiac Arrhythmias.  Written material given at graduation.   Medication Safety: - Group verbal and visual instruction to review commonly prescribed medications for heart and lung disease. Reviews the medication, class of the drug, and side effects. Includes the steps to properly store meds and maintain the prescription regimen.  Written material given at graduation.   Intimacy: - Group verbal instruction through game format to discuss how heart and lung disease can affect sexual intimacy. Written material given at graduation..   Know Your Numbers and Heart Failure: - Group verbal and visual instruction to discuss disease risk factors for cardiac and pulmonary disease and treatment options.  Reviews associated critical values for Overweight/Obesity, Hypertension, Cholesterol, and Diabetes.  Discusses basics of heart failure: signs/symptoms and treatments.  Introduces Heart Failure Zone chart for action plan for heart failure.  Written material given at graduation.   Infection Prevention: - Provides verbal and written material to individual with discussion of infection control including proper hand washing and proper equipment cleaning during exercise session.   Cardiac Rehab from 06/30/2020 in [facility] Cardiac and Pulmonary Rehab  Date 06/30/20  Educator [facility]  Instruction  Review Code 1- Verbalizes Understanding      Falls Prevention: - Provides verbal and written material to individual with discussion of falls prevention and safety.   Cardiac Rehab from 06/30/2020 in [facility] Cardiac and Pulmonary Rehab  Date 06/30/20  Educator [facility]  Instruction Review Code 1- Verbalizes Understanding      Other: -Provides group and verbal instruction on various topics (see comments)   Knowledge Questionnaire Score:  Knowledge Questionnaire Score - 06/29/20 1421      Knowledge Questionnaire Score   Pre Score 25/26- Exercise           Core Components/Risk Factors/Patient Goals at Admission:  Personal Goals and Risk Factors at Admission - 06/30/20 1450      Core Components/Risk Factors/Patient Goals on Admission    Weight Management Yes;Weight Loss    Intervention Weight Management: Develop a combined nutrition and exercise program designed to reach desired caloric intake, while maintaining appropriate intake of nutrient and fiber, sodium and fats, and appropriate energy expenditure required for the weight goal.;Weight Management: Provide education and appropriate resources to help participant work on and attain dietary goals.    Admit Weight 189 lb 12.8 oz (86.1 kg)    Goal Weight: Short Term 185 lb (83.9 kg)    Goal Weight: Long Term 180 lb (81.6 kg)    Expected Outcomes Short Term: Continue to assess and modify interventions until short term weight is achieved;Long Term: Adherence to nutrition and physical activity/exercise program aimed toward attainment of established weight goal;Weight Loss: Understanding of general recommendations for a balanced deficit meal plan, which promotes 1-2 lb weight loss per  week and includes a negative energy balance of [id number] kcal/d;Understanding recommendations for meals to include 15-35% energy as protein, 25-35% energy from fat, 35-60% energy from carbohydrates, less than 200mg  of dietary cholesterol, 20-35 gm of total fiber  daily;Understanding of distribution of calorie intake throughout the day with the consumption of 4-5 meals/snacks    Hypertension Yes    Intervention Provide education on lifestyle modifcations including regular physical activity/exercise, weight management, moderate sodium restriction and increased consumption of fresh fruit, vegetables, and low fat dairy, alcohol moderation, and smoking cessation.;Monitor prescription use compliance.    Expected Outcomes Short Term: Continued assessment and intervention until BP is < 140/43mm HG in hypertensive participants. < 130/65mm HG in hypertensive participants with diabetes, heart failure or chronic kidney disease.;Long Term: Maintenance of blood pressure at goal levels.    Lipids Yes    Intervention Provide education and support for participant on nutrition & aerobic/resistive exercise along with prescribed medications to achieve LDL 70mg , HDL >40mg .    Expected Outcomes Short Term: Participant states understanding of desired cholesterol values and is compliant with medications prescribed. Participant is following exercise prescription and nutrition guidelines.;Long Term: Cholesterol controlled with medications as prescribed, with individualized exercise RX and with personalized nutrition plan. Value goals: LDL < 70mg , HDL > 40 mg.           Education:Diabetes - Individual verbal and written instruction to review signs/symptoms of diabetes, desired ranges of glucose level fasting, after meals and with exercise. Acknowledge that pre and post exercise glucose checks will be done for 3 sessions at entry of program.   Core Components/Risk Factors/Patient Goals Review:    Core Components/Risk Factors/Patient Goals at Discharge (Final Review):    ITP Comments:  ITP Comments    Row Name 06/29/20 1424 06/30/20 1445         ITP Comments Initial telephone orientation completed. Diagnosis can be found in [facility] 11/3. EP orientation scheduled for Tuesday 12/7  at 10am. Completed 6MWT and gym orientation. Initial ITP created and sent for review to Dr. Emily Filbert, Medical Director.             Comments: Initial ITP

## 2020-06-30 NOTE — Patient Instructions (Signed)
Patient Instructions  Patient Details  Name: Clayton Powell MRN: [id number] Date of Birth: 1946/01/12 Referring Provider:  Corey Skains, MD  Below are your personal goals for exercise, nutrition, and risk factors. Our goal is to help you stay on track towards obtaining and maintaining these goals. We will be discussing your progress on these goals with you throughout the program.  Initial Exercise Prescription:  Initial Exercise Prescription - 06/30/20 1400      Date of Initial Exercise RX and Referring Provider   Date 06/30/20    Referring Provider Serafina Royals MD      Treadmill   MPH 2.7    Grade 0.5    Minutes 15    METs 3.25      Elliptical   Level 1    Speed 2.9    Minutes 15    METs 3.2      T5 Nustep   Level 3    SPM 80    Minutes 15    METs 3.2      Prescription Details   Frequency (times per week) 3    Duration Progress to 30 minutes of continuous aerobic without signs/symptoms of physical distress      Intensity   THRR 40-80% of Max Heartrate 100-131    Ratings of Perceived Exertion 11-13    Perceived Dyspnea 0-4      Progression   Progression Continue to progress workloads to maintain intensity without signs/symptoms of physical distress.      Resistance Training   Training Prescription Yes    Weight 4 lb    Reps 10-15           Exercise Goals: Frequency: Be able to perform aerobic exercise two to three times per week in program working toward 2-5 days per week of home exercise.  Intensity: Work with a perceived exertion of 11 (fairly light) - 15 (hard) while following your exercise prescription.  We will make changes to your prescription with you as you progress through the program.   Duration: Be able to do 30 to 45 minutes of continuous aerobic exercise in addition to a 5 minute warm-up and a 5 minute cool-down routine.   Nutrition Goals: Your personal nutrition goals will be established when you do your nutrition analysis with  the dietician.  The following are general nutrition guidelines to follow: Cholesterol < 200mg /day Sodium < 1500mg /day Fiber: Men over 50 yrs - 30 grams per day  Personal Goals:  Personal Goals and Risk Factors at Admission - 06/30/20 1450      Core Components/Risk Factors/Patient Goals on Admission    Weight Management Yes;Weight Loss    Intervention Weight Management: Develop a combined nutrition and exercise program designed to reach desired caloric intake, while maintaining appropriate intake of nutrient and fiber, sodium and fats, and appropriate energy expenditure required for the weight goal.;Weight Management: Provide education and appropriate resources to help participant work on and attain dietary goals.    Admit Weight 189 lb 12.8 oz (86.1 kg)    Goal Weight: Short Term 185 lb (83.9 kg)    Goal Weight: Long Term 180 lb (81.6 kg)    Expected Outcomes Short Term: Continue to assess and modify interventions until short term weight is achieved;Long Term: Adherence to nutrition and physical activity/exercise program aimed toward attainment of established weight goal;Weight Loss: Understanding of general recommendations for a balanced deficit meal plan, which promotes 1-2 lb weight loss per week and includes a negative energy  balance of [id number] kcal/d;Understanding recommendations for meals to include 15-35% energy as protein, 25-35% energy from fat, 35-60% energy from carbohydrates, less than 200mg  of dietary cholesterol, 20-35 gm of total fiber daily;Understanding of distribution of calorie intake throughout the day with the consumption of 4-5 meals/snacks    Hypertension Yes    Intervention Provide education on lifestyle modifcations including regular physical activity/exercise, weight management, moderate sodium restriction and increased consumption of fresh fruit, vegetables, and low fat dairy, alcohol moderation, and smoking cessation.;Monitor prescription use compliance.    Expected  Outcomes Short Term: Continued assessment and intervention until BP is < 140/40mm HG in hypertensive participants. < 130/31mm HG in hypertensive participants with diabetes, heart failure or chronic kidney disease.;Long Term: Maintenance of blood pressure at goal levels.    Lipids Yes    Intervention Provide education and support for participant on nutrition & aerobic/resistive exercise along with prescribed medications to achieve LDL 70mg , HDL >40mg .    Expected Outcomes Short Term: Participant states understanding of desired cholesterol values and is compliant with medications prescribed. Participant is following exercise prescription and nutrition guidelines.;Long Term: Cholesterol controlled with medications as prescribed, with individualized exercise RX and with personalized nutrition plan. Value goals: LDL < 70mg , HDL > 40 mg.           Tobacco Use Initial Evaluation: Social History   Tobacco Use  Smoking Status Never Smoker  Smokeless Tobacco Never Used    Exercise Goals and Review:  Exercise Goals    Row Name 06/30/20 1448             Exercise Goals   Increase Physical Activity Yes       Intervention Provide advice, education, support and counseling about physical activity/exercise needs.;Develop an individualized exercise prescription for aerobic and resistive training based on initial evaluation findings, risk stratification, comorbidities and participant's personal goals.       Expected Outcomes Short Term: Attend rehab on a regular basis to increase amount of physical activity.;Long Term: Add in home exercise to make exercise part of routine and to increase amount of physical activity.;Long Term: Exercising regularly at least 3-5 days a week.       Increase Strength and Stamina Yes       Intervention Provide advice, education, support and counseling about physical activity/exercise needs.;Develop an individualized exercise prescription for aerobic and resistive training  based on initial evaluation findings, risk stratification, comorbidities and participant's personal goals.       Expected Outcomes Short Term: Increase workloads from initial exercise prescription for resistance, speed, and METs.;Long Term: Improve cardiorespiratory fitness, muscular endurance and strength as measured by increased METs and functional capacity (6MWT);Short Term: Perform resistance training exercises routinely during rehab and add in resistance training at home       Able to understand and use rate of perceived exertion (RPE) scale Yes       Intervention Provide education and explanation on how to use RPE scale       Expected Outcomes Short Term: Able to use RPE daily in rehab to express subjective intensity level;Long Term:  Able to use RPE to guide intensity level when exercising independently       Able to understand and use Dyspnea scale Yes       Intervention Provide education and explanation on how to use Dyspnea scale       Expected Outcomes Short Term: Able to use Dyspnea scale daily in rehab to express subjective sense of shortness of breath during  exertion;Long Term: Able to use Dyspnea scale to guide intensity level when exercising independently       Knowledge and understanding of Target Heart Rate Range (THRR) Yes       Intervention Provide education and explanation of THRR including how the numbers were predicted and where they are located for reference       Expected Outcomes Short Term: Able to state/look up THRR;Short Term: Able to use daily as guideline for intensity in rehab;Long Term: Able to use THRR to govern intensity when exercising independently       Able to check pulse independently Yes       Intervention Provide education and demonstration on how to check pulse in carotid and radial arteries.;Review the importance of being able to check your own pulse for safety during independent exercise       Expected Outcomes Short Term: Able to explain why pulse checking  is important during independent exercise;Long Term: Able to check pulse independently and accurately       Understanding of Exercise Prescription Yes       Intervention Provide education, explanation, and written materials on patient's individual exercise prescription       Expected Outcomes Short Term: Able to explain program exercise prescription;Long Term: Able to explain home exercise prescription to exercise independently              Copy of goals given to participant.

## 2020-07-01 ENCOUNTER — Other Ambulatory Visit: Payer: Self-pay

## 2020-07-01 DIAGNOSIS — Z951 Presence of aortocoronary bypass graft: Secondary | ICD-10-CM | POA: Diagnosis not present

## 2020-07-01 NOTE — Progress Notes (Signed)
Daily Session Note  Patient Details  Name: Clayton Powell MRN: [id number] Date of Birth: [birth year] Referring Provider:     Cardiac Rehab from 06/30/2020 in [facility] Cardiac and Pulmonary Rehab  Referring Provider Serafina Royals MD      Encounter Date: 07/01/2020  Check In:  Session Check In - 07/01/20 1115      Check-In   Supervising physician immediately available to respond to emergencies See telemetry face sheet for immediately available ER MD    Location [facility]-Cardiac & Pulmonary Rehab    Staff Present Nada Maclachlan, BA, ACSM CEP, Exercise Physiologist;Jonnie Kubly Rosalia Hammers, MPA, RN;Meredith Sherryll Burger, RN BSN;Joseph Hood RCP,RRT,BSRT    Virtual Visit No    Medication changes reported     No    Fall or balance concerns reported    No    Warm-up and Cool-down Performed on first and last piece of equipment    Resistance Training Performed Yes    VAD Patient? No    PAD/SET Patient? No      Pain Assessment   Currently in Pain? No/denies              Social History   Tobacco Use  Smoking Status Never Smoker  Smokeless Tobacco Never Used    Goals Met:  Independence with exercise equipment Exercise tolerated well No report of cardiac concerns or symptoms Strength training completed today  Goals Unmet:  Not Applicable  Comments: First full day of exercise!  Patient was oriented to gym and equipment including functions, settings, policies, and procedures.  Patient's individual exercise prescription and treatment plan were reviewed.  All starting workloads were established based on the results of the 6 minute walk test done at initial orientation visit.  The plan for exercise progression was also introduced and progression will be customized based on patient's performance and goals.    Dr. Emily Filbert is Medical Director for [place] and [facility].

## 2020-07-03 ENCOUNTER — Other Ambulatory Visit: Payer: Self-pay

## 2020-07-03 DIAGNOSIS — Z951 Presence of aortocoronary bypass graft: Secondary | ICD-10-CM

## 2020-07-03 NOTE — Progress Notes (Signed)
Daily Session Note  Patient Details  Name: Clayton Powell MRN: [id number] Date of Birth: 03-02-1946 Referring Provider:   Flowsheet Row Cardiac Rehab from 06/30/2020 in [facility] Cardiac and Pulmonary Rehab  Referring Provider Serafina Royals MD      Encounter Date: 07/03/2020  Check In:  Session Check In - 07/03/20 1059      Check-In   Supervising physician immediately available to respond to emergencies See telemetry face sheet for immediately available ER MD    Location [facility]-Cardiac & Pulmonary Rehab    Staff Present Birdie Sons, MPA, RN;Joseph Darrin Nipper, [place], RCEP, CCRP, CCET;Meredith Gary, RN BSN    Virtual Visit No    Medication changes reported     No    Fall or balance concerns reported    No    Warm-up and Cool-down Performed on first and last piece of equipment    Resistance Training Performed Yes    VAD Patient? No    PAD/SET Patient? No      Pain Assessment   Currently in Pain? No/denies              Social History   Tobacco Use  Smoking Status Never Smoker  Smokeless Tobacco Never Used    Goals Met:  Independence with exercise equipment Exercise tolerated well No report of cardiac concerns or symptoms Strength training completed today  Goals Unmet:  Not Applicable  Comments: Pt able to follow exercise prescription today without complaint.  Will continue to monitor for progression.    Dr. Emily Filbert is Medical Director for [place] and [facility].

## 2020-07-06 ENCOUNTER — Other Ambulatory Visit: Payer: Self-pay

## 2020-07-06 ENCOUNTER — Encounter: Payer: 59 | Admitting: *Deleted

## 2020-07-06 DIAGNOSIS — Z951 Presence of aortocoronary bypass graft: Secondary | ICD-10-CM | POA: Diagnosis not present

## 2020-07-06 NOTE — Progress Notes (Signed)
Daily Session Note  Patient Details  Name: Clayton Powell MRN: [id number] Date of Birth: 01-Jun-1946 Referring Provider:   Flowsheet Row Cardiac Rehab from 06/30/2020 in [facility] Cardiac and Pulmonary Rehab  Referring Provider Serafina Royals MD      Encounter Date: 07/06/2020  Check In:  Session Check In - 07/06/20 1109      Check-In   Supervising physician immediately available to respond to emergencies See telemetry face sheet for immediately available ER MD    Location [facility]-Cardiac & Pulmonary Rehab    Staff Present Renita Papa, RN BSN;Joseph Lou Miner, [place] Exercise Physiologist;Kelly Amedeo Plenty, [place], ACSM CEP, Exercise Physiologist    Virtual Visit No    Medication changes reported     No    Fall or balance concerns reported    No    Warm-up and Cool-down Performed on first and last piece of equipment    Resistance Training Performed Yes    VAD Patient? No    PAD/SET Patient? No      Pain Assessment   Currently in Pain? No/denies              Social History   Tobacco Use  Smoking Status Never Smoker  Smokeless Tobacco Never Used    Goals Met:  Independence with exercise equipment Exercise tolerated well No report of cardiac concerns or symptoms Strength training completed today  Goals Unmet:  Not Applicable  Comments: Pt able to follow exercise prescription today without complaint.  Will continue to monitor for progression.    Dr. Emily Filbert is Medical Director for [place] and [facility].

## 2020-07-08 ENCOUNTER — Telehealth: Payer: Self-pay | Admitting: Family Medicine

## 2020-07-08 ENCOUNTER — Encounter: Payer: 59 | Admitting: *Deleted

## 2020-07-08 ENCOUNTER — Other Ambulatory Visit: Payer: Self-pay

## 2020-07-08 DIAGNOSIS — Z951 Presence of aortocoronary bypass graft: Secondary | ICD-10-CM

## 2020-07-08 NOTE — Progress Notes (Signed)
Daily Session Note  Patient Details  Name: Clayton Powell MRN: [id number] Date of Birth: [birth year] Referring Provider:   Flowsheet Row Cardiac Rehab from 06/30/2020 in [facility] Cardiac and Pulmonary Rehab  Referring Provider Serafina Royals MD      Encounter Date: 07/08/2020  Check In:  Session Check In - 07/08/20 1141      Check-In   Supervising physician immediately available to respond to emergencies See telemetry face sheet for immediately available ER MD    Location [facility]-Cardiac & Pulmonary Rehab    Staff Present Nada Maclachlan, BA, ACSM CEP, Exercise Physiologist;Melissa [place] RDN, LDN;Joseph Hood RCP,RRT,BSRT;Jessica Vaughn, [place], [place], CCRP, CCET;Mariyam Remington, RN, BSN, CCRP    Virtual Visit No    Medication changes reported     No    Fall or balance concerns reported    No    Warm-up and Cool-down Performed on first and last piece of equipment    Resistance Training Performed Yes    VAD Patient? No    PAD/SET Patient? No      Pain Assessment   Currently in Pain? No/denies              Social History   Tobacco Use  Smoking Status Never Smoker  Smokeless Tobacco Never Used    Goals Met:  Independence with exercise equipment Exercise tolerated well No report of cardiac concerns or symptoms  Goals Unmet:  Not Applicable  Comments: Pt able to follow exercise prescription today without complaint.  Will continue to monitor for progression.    Dr. Emily Filbert is Medical Director for [place] and [facility].

## 2020-07-08 NOTE — Telephone Encounter (Signed)
Can you please get patient scheduled here for the near future?  Thanks.

## 2020-07-08 NOTE — Telephone Encounter (Signed)
Is he going to need a face to face visit for this?  I am fine with putting in the referral but his insurance may not cover it.

## 2020-07-08 NOTE — Telephone Encounter (Signed)
Pt called in wanted to know about getting a referral for PT for strain calf muscle and when he walks its painful and he already goes to cardiac therapy and they told him that he would need a referral

## 2020-07-08 NOTE — Telephone Encounter (Signed)
I would say its up to you.  An OV is always good to have an evaluation done by the referring Provider (for insurance purposes), face-to-face and hands on exam, also bc if any diagnostic imaging is needed in the future for this issue it will likely not be covered d/t lack of conservative treatment and clinical documentation. Insurance may be okay with no OV but its possible long term affect of not having the OV.

## 2020-07-09 DIAGNOSIS — E782 Mixed hyperlipidemia: Secondary | ICD-10-CM | POA: Insufficient documentation

## 2020-07-10 ENCOUNTER — Other Ambulatory Visit: Payer: Self-pay

## 2020-07-10 ENCOUNTER — Encounter: Payer: 59 | Admitting: *Deleted

## 2020-07-10 DIAGNOSIS — Z951 Presence of aortocoronary bypass graft: Secondary | ICD-10-CM

## 2020-07-10 NOTE — Progress Notes (Signed)
Daily Session Note  Patient Details  Name: Clayton Powell MRN: [id number] Date of Birth: 1946-04-24 Referring Provider:   Flowsheet Row Cardiac Rehab from 06/30/2020 in [facility] Cardiac and Pulmonary Rehab  Referring Provider Serafina Royals MD      Encounter Date: 07/10/2020  Check In:  Session Check In - 07/10/20 1100      Check-In   Supervising physician immediately available to respond to emergencies See telemetry face sheet for immediately available ER MD    Location [facility]-Cardiac & Pulmonary Rehab    Staff Present Renita Papa, RN BSN;Joseph [address] [place], [place], RCEP, CCRP, CCET    Virtual Visit No    Medication changes reported     Yes    Comments decreased Aspirin to 61m. Discontinued lisinopril    Fall or balance concerns reported    No    Warm-up and Cool-down Performed on first and last piece of equipment    Resistance Training Performed Yes    VAD Patient? No    PAD/SET Patient? No      Pain Assessment   Currently in Pain? No/denies              Social History   Tobacco Use  Smoking Status Never Smoker  Smokeless Tobacco Never Used    Goals Met:  Independence with exercise equipment Exercise tolerated well No report of cardiac concerns or symptoms Strength training completed today  Goals Unmet:  Not Applicable  Comments: Pt able to follow exercise prescription today without complaint.  Will continue to monitor for progression.    Dr. MEmily Filbertis Medical Director for [facility].

## 2020-07-13 ENCOUNTER — Other Ambulatory Visit: Payer: Self-pay

## 2020-07-13 ENCOUNTER — Encounter: Payer: 59 | Admitting: *Deleted

## 2020-07-13 DIAGNOSIS — Z951 Presence of aortocoronary bypass graft: Secondary | ICD-10-CM

## 2020-07-13 NOTE — Progress Notes (Signed)
Daily Session Note  Patient Details  Name: Clayton Powell MRN: [id number] Date of Birth: [birth year] Referring Provider:   Flowsheet Row Cardiac Rehab from 06/30/2020 in [facility] Cardiac and Pulmonary Rehab  Referring Provider Serafina Royals MD      Encounter Date: 07/13/2020  Check In:  Session Check In - 07/13/20 1111      Check-In   Supervising physician immediately available to respond to emergencies See telemetry face sheet for immediately available ER MD    Location [facility]-Cardiac & Pulmonary Rehab    Staff Present Renita Papa, RN BSN;Joseph Lou Miner, [place] Exercise Physiologist;Kelly Amedeo Plenty, [place], ACSM CEP, Exercise Physiologist    Virtual Visit No    Medication changes reported     No    Fall or balance concerns reported    No    Warm-up and Cool-down Performed on first and last piece of equipment    Resistance Training Performed Yes    VAD Patient? No    PAD/SET Patient? No      Pain Assessment   Currently in Pain? No/denies              Social History   Tobacco Use  Smoking Status Never Smoker  Smokeless Tobacco Never Used    Goals Met:  Independence with exercise equipment Exercise tolerated well No report of cardiac concerns or symptoms Strength training completed today  Goals Unmet:  Not Applicable  Comments: Pt able to follow exercise prescription today without complaint.  Will continue to monitor for progression.    Dr. Emily Filbert is Medical Director for [place] and [facility].

## 2020-07-15 ENCOUNTER — Other Ambulatory Visit: Payer: Self-pay

## 2020-07-15 DIAGNOSIS — Z951 Presence of aortocoronary bypass graft: Secondary | ICD-10-CM | POA: Diagnosis not present

## 2020-07-15 NOTE — Progress Notes (Signed)
Daily Session Note  Patient Details  Name: Clayton Powell MRN: [id number] Date of Birth: 07/29/1945 Referring Provider:   Flowsheet Row Cardiac Rehab from 06/30/2020 in [facility] Cardiac and Pulmonary Rehab  Referring Provider Kowalski, Bruce MD      Encounter Date: 07/15/2020  Check In:  Session Check In - 07/15/20 1030      Check-In   Supervising physician immediately available to respond to emergencies See telemetry face sheet for immediately available ER MD    Location [facility]-Cardiac & Pulmonary Rehab    Staff Present Kelly Bollinger, MPA, RN;Joseph Hood RCP,RRT,BSRT;Amanda Sommer, BA, ACSM CEP, Exercise Physiologist    Virtual Visit No    Medication changes reported     No    Fall or balance concerns reported    No    Warm-up and Cool-down Performed on first and last piece of equipment    Resistance Training Performed Yes    VAD Patient? No    PAD/SET Patient? No      Pain Assessment   Currently in Pain? No/denies              Social History   Tobacco Use  Smoking Status Never Smoker  Smokeless Tobacco Never Used    Goals Met:  Independence with exercise equipment Exercise tolerated well No report of cardiac concerns or symptoms Strength training completed today  Goals Unmet:  Not Applicable  Comments: Pt able to follow exercise prescription today without complaint.  Will continue to monitor for progression.    Dr. Mark Miller is Medical Director for [facility] and [facility]. 

## 2020-07-20 ENCOUNTER — Encounter: Payer: 59 | Admitting: *Deleted

## 2020-07-20 ENCOUNTER — Other Ambulatory Visit: Payer: Self-pay

## 2020-07-20 DIAGNOSIS — Z951 Presence of aortocoronary bypass graft: Secondary | ICD-10-CM

## 2020-07-20 NOTE — Progress Notes (Signed)
Daily Session Note  Patient Details  Name: Clayton Powell MRN: [id number] Date of Birth: 1946-02-20 Referring Provider:   Flowsheet Row Cardiac Rehab from 06/30/2020 in [facility] Cardiac and Pulmonary Rehab  Referring Provider Serafina Royals MD      Encounter Date: 07/20/2020  Check In:  Session Check In - 07/20/20 1109      Check-In   Supervising physician immediately available to respond to emergencies See telemetry face sheet for immediately available ER MD    Location [facility]-Cardiac & Pulmonary Rehab    Staff Present Renita Papa, RN BSN;Joseph Hood RCP,RRT,BSRT;Amanda Oletta Darter, [place], ACSM CEP, Exercise Physiologist    Virtual Visit No    Medication changes reported     No    Fall or balance concerns reported    No    Warm-up and Cool-down Performed on first and last piece of equipment    Resistance Training Performed Yes    VAD Patient? No    PAD/SET Patient? No      Pain Assessment   Currently in Pain? No/denies              Social History   Tobacco Use  Smoking Status Never Smoker  Smokeless Tobacco Never Used    Goals Met:  Independence with exercise equipment Exercise tolerated well No report of cardiac concerns or symptoms Strength training completed today  Goals Unmet:  Not Applicable  Comments: Pt able to follow exercise prescription today without complaint.  Will continue to monitor for progression.    Dr. Emily Filbert is Medical Director for [place] and [facility].

## 2020-07-22 ENCOUNTER — Encounter: Payer: 59 | Admitting: *Deleted

## 2020-07-22 ENCOUNTER — Other Ambulatory Visit: Payer: Self-pay

## 2020-07-22 ENCOUNTER — Encounter: Payer: Self-pay | Admitting: *Deleted

## 2020-07-22 DIAGNOSIS — Z951 Presence of aortocoronary bypass graft: Secondary | ICD-10-CM | POA: Diagnosis not present

## 2020-07-22 NOTE — Progress Notes (Signed)
Daily Session Note  Patient Details  Name: Clayton Powell MRN: [id number] Date of Birth: [birth year] Referring Provider:   Flowsheet Row Cardiac Rehab from 06/30/2020 in [facility]  Referring Provider Serafina Royals MD      Encounter Date: 07/22/2020  Check In:  Session Check In - 07/22/20 1050      Check-In   Supervising physician immediately available to respond to emergencies See telemetry face sheet for immediately available ER MD    Location [facility]-Cardiac & Pulmonary Rehab    Staff Present Renita Papa, RN Vickki Hearing, BA, ACSM CEP, Exercise Physiologist;Kara Eliezer Bottom, MS Exercise Physiologist;Melissa Caiola RDN, LDN    Virtual Visit No    Medication changes reported     No    Fall or balance concerns reported    No    Warm-up and Cool-down Performed on first and last piece of equipment    Resistance Training Performed Yes    VAD Patient? No    PAD/SET Patient? No      Pain Assessment   Currently in Pain? No/denies              Social History   Tobacco Use  Smoking Status Never Smoker  Smokeless Tobacco Never Used    Goals Met:  Independence with exercise equipment Exercise tolerated well No report of cardiac concerns or symptoms Strength training completed today  Goals Unmet:  Not Applicable  Comments: Pt able to follow exercise prescription today without complaint.  Will continue to monitor for progression.    Dr. Emily Filbert is Medical Director for [facility].

## 2020-07-22 NOTE — Progress Notes (Signed)
Cardiac Individual Treatment Plan  Patient Details  Name: Clayton Powell MRN: [id number] Date of Birth: 03-Nov-1945 Referring Provider:   Flowsheet Row Cardiac Rehab from 06/30/2020 in [facility] Cardiac and Pulmonary Rehab  Referring Provider Serafina Royals MD      Initial Encounter Date:  Flowsheet Row Cardiac Rehab from 06/30/2020 in [facility] Cardiac and Pulmonary Rehab  Date 06/30/20      Visit Diagnosis: S/P CABG x 3  Patient's Home Medications on Admission:  Current Outpatient Medications:  .  alfuzosin (UROXATRAL) 10 MG 24 hr tablet, Take 1 tablet (10 mg total) by mouth daily with breakfast., Disp: 90 tablet, Rfl: 3 .  aspirin EC 325 MG EC tablet, Take 1 tablet (325 mg total) by mouth daily., Disp: , Rfl:  .  atorvastatin (LIPITOR) 80 MG tablet, Take 1 tablet (80 mg total) by mouth daily at 6 PM., Disp: 30 tablet, Rfl: 1 .  Azelastine-Fluticasone 137-50 MCG/ACT SUSP, Place 1 spray into both nostrils 2 (two) times daily., Disp: , Rfl:  .  calcium citrate-vitamin D (CITRACAL+D) 315-200 MG-UNIT tablet, Take 1-2 tablets by mouth 2 (two) times daily., Disp: , Rfl:  .  Cholecalciferol (VITAMIN D) 2000 UNITS CAPS, Take 1 capsule by mouth daily.  , Disp: , Rfl:  .  clindamycin (CLEOCIN T) 1 % external solution, Apply topically 2 (two) times daily as needed., Disp: , Rfl:  .  Cyanocobalamin (VITAMIN B-12) 5000 MCG SUBL, Place 1 tablet under the tongue daily., Disp: , Rfl:  .  finasteride (PROSCAR) 5 MG tablet, Take 1 tablet (5 mg total) by mouth daily., Disp: 90 tablet, Rfl: 3 .  Icosapent Ethyl (VASCEPA) 1 G CAPS, Take 1 tablet by mouth 4 (four) times daily., Disp: , Rfl:  .  lisinopril (ZESTRIL) 20 MG tablet, Take 1 tablet (20 mg total) by mouth daily., Disp: 30 tablet, Rfl: 1 .  Multiple Vitamin (MULTIVITAMIN) tablet, Take 1 tablet by mouth daily.  , Disp: , Rfl:  .  opium-belladonna (B&O SUPPRETTES) 16.2-60 MG suppository, Place 1 suppository rectally every 8 (eight) hours as needed for  bladder spasms., Disp: 6 suppository, Rfl: 0 .  rosuvastatin (CRESTOR) 40 MG tablet, Take 40 mg by mouth daily., Disp: , Rfl:  .  traMADol (ULTRAM) 50 MG tablet, Take 1 tablet (50 mg total) by mouth every 6 (six) hours as needed for moderate pain., Disp: 28 tablet, Rfl: 0  Current Facility-Administered Medications:  .  betamethasone acetate-betamethasone sodium phosphate (CELESTONE) injection 3 mg, 3 mg, Intramuscular, Once, Edrick Kins, DPM  Past Medical History: Past Medical History:  Diagnosis Date  . Allergy   . Anginal pain ([place])   . BPH (benign prostatic hypertrophy) 6/07  . CAD (coronary artery disease) 2010   dx made by coronary calcium scoring, Normal stress test in 2010.   Marland Kitchen GERD (gastroesophageal reflux disease) 12/05  . HLD (hyperlipidemia) 3/99  . HTN (hypertension) 2002   no meds  . Left rotator cuff tear 08/12/2011   Per Dr. Mardelle Matte 2013 L rotator cuff tear on MRI 2013   . Right rotator cuff tear 05/24/2013  . Wears glasses     Tobacco Use: Social History   Tobacco Use  Smoking Status Never Smoker  Smokeless Tobacco Never Used    Labs: Recent Review Flowsheet Data    Labs for ITP Cardiac and Pulmonary Rehab Latest Ref Rng & Units 05/29/2020 05/29/2020 05/29/2020 05/29/2020 05/29/2020   Cholestrol 0 - 200 mg/dL - - - - -  LDLCALC 0 - 99 mg/dL - - - - -   HDL >40 mg/dL - - - - -   Trlycerides <150 mg/dL - - - - -   Hemoglobin A1c 4.8 - 5.6 % - - - - -   PHART 7.350 - 7.450 7.342(L) - 7.305(L) 7.363 7.356   PCO2ART 32.0 - 48.0 mmHg 40.7 - 48.4(H) 38.8 35.6   HCO3 20.0 - 28.0 mmol/L 22.0 - 24.2 22.2 19.9(L)   TCO2 22 - 32 mmol/L 23 21(L) 26 23 21(L)   ACIDBASEDEF 0.0 - 2.0 mmol/L 3.0(H) - 3.0(H) 3.0(H) 5.0(H)   O2SAT % 94.0 - 98.0 96.0 97.0       Exercise Target Goals: Exercise Program Goal: Individual exercise prescription set using results from initial 6 min walk test and THRR while considering  patient's activity barriers and safety.   Exercise  Prescription Goal: Initial exercise prescription builds to 30-45 minutes a day of aerobic activity, 2-3 days per week.  Home exercise guidelines will be given to patient during program as part of exercise prescription that the participant will acknowledge.   Education: Aerobic Exercise: - Group verbal and visual presentation on the components of exercise prescription. Introduces F.I.T.T principle from ACSM for exercise prescriptions.  Reviews F.I.T.T. principles of aerobic exercise including progression. Written material given at graduation.   Education: Resistance Exercise: - Group verbal and visual presentation on the components of exercise prescription. Introduces F.I.T.T principle from ACSM for exercise prescriptions  Reviews F.I.T.T. principles of resistance exercise including progression. Written material given at graduation. Flowsheet Row Cardiac Rehab from 07/15/2020 in [facility] Cardiac and Pulmonary Rehab  Date 07/15/20  Educator [facility]  Instruction Review Code 1- [employer] Understanding       Education: Exercise & Equipment Safety: - Individual verbal instruction and demonstration of equipment use and safety with use of the equipment. Flowsheet Row Cardiac Rehab from 07/15/2020 in [facility] Cardiac and Pulmonary Rehab  Date 06/30/20  Educator [facility]  Instruction Review Code 1- Verbalizes Understanding      Education: Exercise Physiology & General Exercise Guidelines: - Group verbal and written instruction with models to review the exercise physiology of the cardiovascular system and associated critical values. Provides general exercise guidelines with specific guidelines to those with heart or lung disease.  Flowsheet Row Cardiac Rehab from 07/15/2020 in [facility] Cardiac and Pulmonary Rehab  Education need identified 06/29/20  Date 07/08/20  Educator [facility]  Instruction Review Code 1- [employer] Understanding      Education: Flexibility, Balance, Mind/Body Relaxation: - Group verbal and  visual presentation with interactive activity on the components of exercise prescription. Introduces F.I.T.T principle from ACSM for exercise prescriptions. Reviews F.I.T.T. principles of flexibility and balance exercise training including progression. Also discusses the mind body connection.  Reviews various relaxation techniques to help reduce and manage stress (i.e. Deep breathing, progressive muscle relaxation, and visualization). Balance handout provided to take home. Written material given at graduation.   Activity Barriers & Risk Stratification:  Activity Barriers & Cardiac Risk Stratification - 06/30/20 1446      Activity Barriers & Cardiac Risk Stratification   Activity Barriers Incisional Pain;Deconditioning;Other (comment)    Comments previous bone spur surgery (cause pain on R leg from thigh to calf)    Cardiac Risk Stratification High           6 Minute Walk:  6 Minute Walk    Row Name 06/30/20 1445         6 Minute Walk   Phase Initial  Distance 1510 feet     Walk Time 6 minutes     # of Rest Breaks 0     MPH 2.86     METS 3.24     RPE 13     Perceived Dyspnea  0     VO2 Peak 11.33     Symptoms Yes (comment)     Comments bone spur/calf bothersome 6/10     Resting HR 70 bpm     Resting BP 134/72     Resting Oxygen Saturation  98 %     Exercise Oxygen Saturation  during 6 min walk 98 %     Max Ex. HR 119 bpm     Max Ex. BP 142/64     2 Minute Post BP 122/66            Oxygen Initial Assessment:   Oxygen Re-Evaluation:   Oxygen Discharge (Final Oxygen Re-Evaluation):   Initial Exercise Prescription:  Initial Exercise Prescription - 06/30/20 1400      Date of Initial Exercise RX and Referring Provider   Date 06/30/20    Referring Provider Serafina Royals MD      Treadmill   MPH 2.7    Grade 0.5    Minutes 15    METs 3.25      Elliptical   Level 1    Speed 2.9    Minutes 15    METs 3.2      T5 Nustep   Level 3    SPM 80     Minutes 15    METs 3.2      Prescription Details   Frequency (times per week) 3    Duration Progress to 30 minutes of continuous aerobic without signs/symptoms of physical distress      Intensity   THRR 40-80% of Max Heartrate 100-131    Ratings of Perceived Exertion 11-13    Perceived Dyspnea 0-4      Progression   Progression Continue to progress workloads to maintain intensity without signs/symptoms of physical distress.      Resistance Training   Training Prescription Yes    Weight 4 lb    Reps 10-15           Perform Capillary Blood Glucose checks as needed.  Exercise Prescription Changes:  Exercise Prescription Changes    Row Name 06/30/20 1400 07/14/20 0700           Response to Exercise   Blood Pressure (Admit) 134/72 100/60      Blood Pressure (Exercise) 142/64 128/64      Blood Pressure (Exit) 122/66 94/56      Heart Rate (Admit) 70 bpm 73 bpm      Heart Rate (Exercise) 119 bpm 116 bpm      Heart Rate (Exit) 80 bpm 92 bpm      Oxygen Saturation (Admit) 98 % --      Oxygen Saturation (Exercise) 98 % --      Rating of Perceived Exertion (Exercise) 13 13      Perceived Dyspnea (Exercise) 0 --      Symptoms bone spur/calf bothersome 6/10 none      Duration -- Continue with 30 min of aerobic exercise without signs/symptoms of physical distress.      Intensity -- THRR unchanged             Progression   Progression -- Continue to progress workloads to maintain intensity without signs/symptoms of physical distress.  Average METs -- 2.98             Resistance Training   Training Prescription -- Yes      Weight -- 4 lb      Reps -- 10-15             Interval Training   Interval Training -- No             Treadmill   MPH -- 2.7      Grade -- 0.5      Minutes -- 15      METs -- 3.25             Elliptical   Level -- 1      Speed -- 2.9      Minutes -- 15      METs -- 2.7             T5 Nustep   Level -- 3      Minutes -- 15       METs -- 3             Exercise Comments:  Exercise Comments    Row Name 07/01/20 1116           Exercise Comments First full day of exercise!  Patient was oriented to gym and equipment including functions, settings, policies, and procedures.  Patient's individual exercise prescription and treatment plan were reviewed.  All starting workloads were established based on the results of the 6 minute walk test done at initial orientation visit.  The plan for exercise progression was also introduced and progression will be customized based on patient's performance and goals.              Exercise Goals and Review:  Exercise Goals    Row Name 06/30/20 1448             Exercise Goals   Increase Physical Activity Yes       Intervention Provide advice, education, support and counseling about physical activity/exercise needs.;Develop an individualized exercise prescription for aerobic and resistive training based on initial evaluation findings, risk stratification, comorbidities and participant's personal goals.       Expected Outcomes Short Term: Attend rehab on a regular basis to increase amount of physical activity.;Long Term: Add in home exercise to make exercise part of routine and to increase amount of physical activity.;Long Term: Exercising regularly at least 3-5 days a week.       Increase Strength and Stamina Yes       Intervention Provide advice, education, support and counseling about physical activity/exercise needs.;Develop an individualized exercise prescription for aerobic and resistive training based on initial evaluation findings, risk stratification, comorbidities and participant's personal goals.       Expected Outcomes Short Term: Increase workloads from initial exercise prescription for resistance, speed, and METs.;Long Term: Improve cardiorespiratory fitness, muscular endurance and strength as measured by increased METs and functional capacity (6MWT);Short Term: Perform  resistance training exercises routinely during rehab and add in resistance training at home       Able to understand and use rate of perceived exertion (RPE) scale Yes       Intervention Provide education and explanation on how to use RPE scale       Expected Outcomes Short Term: Able to use RPE daily in rehab to express subjective intensity level;Long Term:  Able to use RPE to guide intensity level when exercising independently  Able to understand and use Dyspnea scale Yes       Intervention Provide education and explanation on how to use Dyspnea scale       Expected Outcomes Short Term: Able to use Dyspnea scale daily in rehab to express subjective sense of shortness of breath during exertion;Long Term: Able to use Dyspnea scale to guide intensity level when exercising independently       Knowledge and understanding of Target Heart Rate Range (THRR) Yes       Intervention Provide education and explanation of THRR including how the numbers were predicted and where they are located for reference       Expected Outcomes Short Term: Able to state/look up THRR;Short Term: Able to use daily as guideline for intensity in rehab;Long Term: Able to use THRR to govern intensity when exercising independently       Able to check pulse independently Yes       Intervention Provide education and demonstration on how to check pulse in carotid and radial arteries.;Review the importance of being able to check your own pulse for safety during independent exercise       Expected Outcomes Short Term: Able to explain why pulse checking is important during independent exercise;Long Term: Able to check pulse independently and accurately       Understanding of Exercise Prescription Yes       Intervention Provide education, explanation, and written materials on patient's individual exercise prescription       Expected Outcomes Short Term: Able to explain program exercise prescription;Long Term: Able to explain home  exercise prescription to exercise independently              Exercise Goals Re-Evaluation :  Exercise Goals Re-Evaluation    Row Name 07/01/20 1116 07/14/20 0743           Exercise Goal Re-Evaluation   Exercise Goals Review Increase Physical Activity;Able to understand and use rate of perceived exertion (RPE) scale;Knowledge and understanding of Target Heart Rate Range (THRR);Understanding of Exercise Prescription;Increase Strength and Stamina;Able to understand and use Dyspnea scale;Able to check pulse independently Increase Physical Activity;Increase Strength and Stamina;Understanding of Exercise Prescription      Comments Reviewed RPE and dyspnea scales, THR and program prescription with pt today.  Pt voiced understanding and was given a copy of goals to take home. Harvie Heck is off to a great start in rehab.  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  We will continue to monitor his progress.      Expected Outcomes Short: Use RPE daily to regulate intensity. Long: Follow program prescription in THR. Short: Review home exercise guidelines and improve workloads Long: Continue to follow program prescription             Discharge Exercise Prescription (Final Exercise Prescription Changes):  Exercise Prescription Changes - 07/14/20 0700      Response to Exercise   Blood Pressure (Admit) 100/60    Blood Pressure (Exercise) 128/64    Blood Pressure (Exit) 94/56    Heart Rate (Admit) 73 bpm    Heart Rate (Exercise) 116 bpm    Heart Rate (Exit) 92 bpm    Rating of Perceived Exertion (Exercise) 13    Symptoms none    Duration Continue with 30 min of aerobic exercise without signs/symptoms of physical distress.    Intensity THRR unchanged      Progression   Progression Continue to  progress workloads to maintain intensity without signs/symptoms of physical distress.    Average METs 2.98      Resistance Training   Training Prescription Yes     Weight 4 lb    Reps 10-15      Interval Training   Interval Training No      Treadmill   MPH 2.7    Grade 0.5    Minutes 15    METs 3.25      Elliptical   Level 1    Speed 2.9    Minutes 15    METs 2.7      T5 Nustep   Level 3    Minutes 15    METs 3           Nutrition:  Target Goals: Understanding of nutrition guidelines, daily intake of sodium 1500mg , cholesterol 200mg , calories 30% from fat and 7% or less from saturated fats, daily to have 5 or more servings of fruits and vegetables.  Education: All About Nutrition: -Group instruction provided by verbal, written material, interactive activities, discussions, models, and posters to present general guidelines for heart healthy nutrition including fat, fiber, MyPlate, the role of sodium in heart healthy nutrition, utilization of the nutrition label, and utilization of this knowledge for meal planning. Follow up email sent as well. Written material given at graduation.   Biometrics:  Pre Biometrics - 06/30/20 1449      Pre Biometrics   Height 5' 7.6" (1.717 m)    Weight 189 lb 12.8 oz (86.1 kg)    BMI (Calculated) 29.2    Single Leg Stand 15.8 seconds            Nutrition Therapy Plan and Nutrition Goals:   Nutrition Assessments:  MEDIFICTS Score Key:  ?70 Need to make dietary changes   40-70 Heart Healthy Diet  ? 40 Therapeutic Level Cholesterol Diet  Flowsheet Row [facility]iac [facility]  Picture Your Plate Total Score on Admission 26     Picture Your Plate Scores:  <16<40 Unhealthy dietary pattern with much room for improvement.  41-50 Dietary pattern unlikely to meet recommendations for good health and room for improvement.  51-60 More healthful dietary pattern, with some room for improvement.   >60 Healthy dietary pattern, although there may be some specific behaviors that could be improved.    Nutrition Goals Re-Evaluation:   Nutrition  Goals Discharge (Final Nutrition Goals Re-Evaluation):   Psychosocial: Target Goals: Acknowledge presence or absence of significant depression and/or stress, maximize coping skills, provide positive support system. Participant is able to verbalize types and ability to use techniques and skills needed for reducing stress and depression.   Education: Stress, Anxiety, and Depression - Group verbal and visual presentation to define topics covered.  Reviews how body is impacted by stress, anxiety, and depression.  Also discusses healthy ways to reduce stress and to treat/manage anxiety and depression.  Written material given at graduation. Flowsheet Row [facility]  Date 07/01/20  Educator [facility]  Instruction Review Code 1- [employer] Understanding      Education: Sleep Hygiene -Provides group verbal and written instruction about how sleep can affect your health.  Define sleep hygiene, discuss sleep cycles and impact of sleep habits. Review good sleep hygiene tips.    Initial Review & Psychosocial Screening:  Initial Psych Review & Screening - 06/29/20 1408      Initial Review  Current issues with None Identified      Family Dynamics   Good Support System? Yes   wife     Barriers   Psychosocial barriers to participate in program There are no identifiable barriers or psychosocial needs.;The patient should benefit from training in stress management and relaxation.      Screening Interventions   Interventions Encouraged to exercise;To provide support and resources with identified psychosocial needs    Expected Outcomes Short Term goal: Utilizing psychosocial counselor, staff and physician to assist with identification of specific Stressors or current issues interfering with healing process. Setting desired goal for each stressor or current issue identified.;Long Term Goal: Stressors or current issues are controlled or eliminated.;Short Term  goal: Identification and review with participant of any Quality of Life or Depression concerns found by scoring the questionnaire.;Long Term goal: The participant improves quality of Life and PHQ9 Scores as seen by post scores and/or verbalization of changes           Quality of Life Scores:   Quality of Life - 06/29/20 1421      Quality of Life   Select Quality of Life      Quality of Life Scores   Health/Function Pre 24.13 %    Socioeconomic Pre 25.18 %    Psych/Spiritual Pre 25.35 %    Family Pre 19.2 %    GLOBAL Pre 23.91 %          Scores of 19 and below usually indicate a poorer quality of life in these areas.  A difference of  2-3 points is a clinically meaningful difference.  A difference of 2-3 points in the total score of the Quality of Life Index has been associated with significant improvement in overall quality of life, self-image, physical symptoms, and general health in studies assessing change in quality of life.  PHQ-9: Recent Review Flowsheet Data    Depression screen [facility] 2/9 06/30/2020 04/11/2017   Decreased Interest 0 0   Down, Depressed, Hopeless 0 0   PHQ - 2 Score 0 0   Altered sleeping 0 -   Tired, decreased energy 0 -   Change in appetite 0 -   Feeling bad or failure about yourself  0 -   Trouble concentrating 0 -   Moving slowly or fidgety/restless 0 -   Suicidal thoughts 0 -   PHQ-9 Score 0 -   Difficult doing work/chores Not difficult at all -     Interpretation of Total Score  Total Score Depression Severity:  1-4 = Minimal depression, 5-9 = Mild depression, 10-14 = Moderate depression, 15-19 = Moderately severe depression, 20-27 = Severe depression   Psychosocial Evaluation and Intervention:  Psychosocial Evaluation - 06/29/20 1418      Psychosocial Evaluation & Interventions   Interventions Encouraged to exercise with the program and follow exercise prescription    Comments Louie Casa 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 His  incision is healing well and he is slowly progressing his activity. He is ready to get back to golf and full time at his job, but is taking it day by day. He is very health conscious and stays on top of what his body is feeling. He reports sleeping well and minimal stress. The most recent stressor is his daughter getting COVID, but is doing well recovering. He hopes to continue progressing on his health journey through cardiac rehab.    Expected Outcomes Short: attend cardiac rehab for education and exercise. Long: develop  and maintain positive self care habits.    Continue Psychosocial Services  Follow up required by staff           Psychosocial Re-Evaluation:   Psychosocial Discharge (Final Psychosocial Re-Evaluation):   Vocational Rehabilitation: Provide vocational rehab assistance to qualifying candidates.   Vocational Rehab Evaluation & Intervention:  Vocational Rehab - 06/29/20 1401      Initial Vocational Rehab Evaluation & Intervention   Assessment shows need for Vocational Rehabilitation No           Education: Education Goals: Education classes will be provided on a variety of topics geared toward better understanding of heart health and risk factor modification. Participant will state understanding/return demonstration of topics presented as noted by education test scores.  Learning Barriers/Preferences:  Learning Barriers/Preferences - 06/29/20 1407      Learning Barriers/Preferences   Learning Barriers None    Learning Preferences None           General Cardiac Education Topics:  AED/CPR: - Group verbal and written instruction with the use of models to demonstrate the basic use of the AED with the basic ABC's of resuscitation.   Anatomy and Cardiac Procedures: - Group verbal and visual presentation and models provide information about basic cardiac anatomy and function. Reviews the testing methods done to diagnose heart disease and the outcomes of the test  results. Describes the treatment choices: Medical Management, Angioplasty, or Coronary Bypass Surgery for treating various heart conditions including Myocardial Infarction, Angina, Valve Disease, and Cardiac Arrhythmias.  Written material given at graduation. Flowsheet Row Cardiac Rehab from 07/15/2020 in [facility] Cardiac and Pulmonary Rehab  Date 07/15/20  Educator [facility]  Instruction Review Code 1- Verbalizes Understanding      Medication Safety: - Group verbal and visual instruction to review commonly prescribed medications for heart and lung disease. Reviews the medication, class of the drug, and side effects. Includes the steps to properly store meds and maintain the prescription regimen.  Written material given at graduation.   Intimacy: - Group verbal instruction through game format to discuss how heart and lung disease can affect sexual intimacy. Written material given at graduation..   Know Your Numbers and Heart Failure: - Group verbal and visual instruction to discuss disease risk factors for cardiac and pulmonary disease and treatment options.  Reviews associated critical values for Overweight/Obesity, Hypertension, Cholesterol, and Diabetes.  Discusses basics of heart failure: signs/symptoms and treatments.  Introduces Heart Failure Zone chart for action plan for heart failure.  Written material given at graduation.   Infection Prevention: - Provides verbal and written material to individual with discussion of infection control including proper hand washing and proper equipment cleaning during exercise session. Flowsheet Row Cardiac Rehab from 07/15/2020 in [facility] Cardiac and Pulmonary Rehab  Date 06/30/20  Educator [facility]  Instruction Review Code 1- Verbalizes Understanding      Falls Prevention: - Provides verbal and written material to individual with discussion of falls prevention and safety. Flowsheet Row Cardiac Rehab from 07/15/2020 in [facility] Cardiac and Pulmonary Rehab  Date  06/30/20  Educator [facility]  Instruction Review Code 1- Verbalizes Understanding      Other: -Provides group and verbal instruction on various topics (see comments)   Knowledge Questionnaire Score:  Knowledge Questionnaire Score - 06/29/20 1421      Knowledge Questionnaire Score   Pre Score 25/26- Exercise           Core Components/Risk Factors/Patient Goals at Admission:  Personal Goals and Risk Factors at  Admission - 06/30/20 1450      Core Components/Risk Factors/Patient Goals on Admission    Weight Management Yes;Weight Loss    Intervention Weight Management: Develop a combined nutrition and exercise program designed to reach desired caloric intake, while maintaining appropriate intake of nutrient and fiber, sodium and fats, and appropriate energy expenditure required for the weight goal.;Weight Management: Provide education and appropriate resources to help participant work on and attain dietary goals.    Admit Weight 189 lb 12.8 oz (86.1 kg)    Goal Weight: Short Term 185 lb (83.9 kg)    Goal Weight: Long Term 180 lb (81.6 kg)    Expected Outcomes Short Term: Continue to assess and modify interventions until short term weight is achieved;Long Term: Adherence to nutrition and physical activity/exercise program aimed toward attainment of established weight goal;Weight Loss: Understanding of general recommendations for a balanced deficit meal plan, which promotes 1-2 lb weight loss per week and includes a negative energy balance of [phone] kcal/d;Understanding recommendations for meals to include 15-35% energy as protein, 25-35% energy from fat, 35-60% energy from carbohydrates, less than 200mg  of dietary cholesterol, 20-35 gm of total fiber daily;Understanding of distribution of calorie intake throughout the day with the consumption of 4-5 meals/snacks    Hypertension Yes    Intervention Provide education on lifestyle modifcations including regular physical activity/exercise, weight  management, moderate sodium restriction and increased consumption of fresh fruit, vegetables, and low fat dairy, alcohol moderation, and smoking cessation.;Monitor prescription use compliance.    Expected Outcomes Short Term: Continued assessment and intervention until BP is < 140/42mm HG in hypertensive participants. < 130/63mm HG in hypertensive participants with diabetes, heart failure or chronic kidney disease.;Long Term: Maintenance of blood pressure at goal levels.    Lipids Yes    Intervention Provide education and support for participant on nutrition & aerobic/resistive exercise along with prescribed medications to achieve LDL 70mg , HDL >40mg .    Expected Outcomes Short Term: Participant states understanding of desired cholesterol values and is compliant with medications prescribed. Participant is following exercise prescription and nutrition guidelines.;Long Term: Cholesterol controlled with medications as prescribed, with individualized exercise RX and with personalized nutrition plan. Value goals: LDL < 70mg , HDL > 40 mg.           Education:Diabetes - Individual verbal and written instruction to review signs/symptoms of diabetes, desired ranges of glucose level fasting, after meals and with exercise. Acknowledge that pre and post exercise glucose checks will be done for 3 sessions at entry of program.   Core Components/Risk Factors/Patient Goals Review:    Core Components/Risk Factors/Patient Goals at Discharge (Final Review):    ITP Comments:  ITP Comments    Row Name 06/29/20 1424 06/30/20 1445 07/01/20 1115 07/22/20 0740     ITP Comments Initial telephone orientation completed. Diagnosis can be found in [facility] 11/3. EP orientation scheduled for Tuesday 12/7 at 10am. Completed 6MWT and gym orientation. Initial ITP created and sent for review to Dr. Emily Filbert, Medical Director. First full day of exercise!  Patient was oriented to gym and equipment including functions, settings,  policies, and procedures.  Patient's individual exercise prescription and treatment plan were reviewed.  All starting workloads were established based on the results of the 6 minute walk test done at initial orientation visit.  The plan for exercise progression was also introduced and progression will be customized based on patient's performance and goals. 30 Day review completed. Medical Director ITP review done, changes made as directed, and  signed approval by Medical Director.           Comments:

## 2020-07-27 ENCOUNTER — Other Ambulatory Visit: Payer: Self-pay

## 2020-07-27 ENCOUNTER — Encounter: Payer: 59 | Attending: Internal Medicine

## 2020-07-27 DIAGNOSIS — Z951 Presence of aortocoronary bypass graft: Secondary | ICD-10-CM | POA: Diagnosis present

## 2020-07-27 NOTE — Telephone Encounter (Signed)
  Patient called stating that he had called several weeks ago about a possible referral for lower leg cramps. Patient stated that he never heard anything back. Patient stated that this has been going on for about three months. Patient scheduled to see Dr. Para March 07/31/20 at 3:00 pm.

## 2020-07-27 NOTE — Telephone Encounter (Signed)
I thought he was getting called to get scheduled here at the clinic.  What was the status with that?

## 2020-07-27 NOTE — Telephone Encounter (Signed)
He sees you 07/27/20 at 3

## 2020-07-27 NOTE — Progress Notes (Signed)
Daily Session Note  Patient Details  Name: Clayton Powell MRN: [id number] Date of Birth: Sep 24, 1945 Referring Provider:   Flowsheet Row Cardiac Rehab from 06/30/2020 in [facility] Cardiac and Pulmonary Rehab  Referring Provider Serafina Royals MD      Encounter Date: 07/27/2020  Check In:  Session Check In - 07/27/20 1720      Check-In   Supervising physician immediately available to respond to emergencies See telemetry face sheet for immediately available ER MD    Location [facility]-Cardiac & Pulmonary Rehab    Staff Present Birdie Sons, MPA, Nino Glow, MS Exercise Physiologist    Virtual Visit No    Medication changes reported     No    Fall or balance concerns reported    No    Warm-up and Cool-down Performed on first and last piece of equipment    Resistance Training Performed Yes    VAD Patient? No    PAD/SET Patient? No      Pain Assessment   Currently in Pain? No/denies              Social History   Tobacco Use  Smoking Status Never Smoker  Smokeless Tobacco Never Used    Goals Met:  Independence with exercise equipment Exercise tolerated well No report of cardiac concerns or symptoms Strength training completed today  Goals Unmet:  Not Applicable  Comments: Pt able to follow exercise prescription today without complaint.  Will continue to monitor for progression.    Dr. Emily Filbert is Medical Director for [place] and [facility].

## 2020-07-28 NOTE — Telephone Encounter (Signed)
Schedule 07/31/2020.  I will see them.

## 2020-07-29 ENCOUNTER — Other Ambulatory Visit: Payer: Self-pay

## 2020-07-29 DIAGNOSIS — Z951 Presence of aortocoronary bypass graft: Secondary | ICD-10-CM | POA: Diagnosis not present

## 2020-07-29 NOTE — Progress Notes (Signed)
Daily Session Note  Patient Details  Name: Clayton Powell MRN: [id number] Date of Birth: Jun 11, 1946 Referring Provider:   Flowsheet Row Cardiac Rehab from 06/30/2020 in [facility] Cardiac and Pulmonary Rehab  Referring Provider Serafina Royals MD      Encounter Date: 07/29/2020  Check In:  Session Check In - 07/29/20 1642      Check-In   Supervising physician immediately available to respond to emergencies See telemetry face sheet for immediately available ER MD    Location [facility]-Cardiac & Pulmonary Rehab    Staff Present Birdie Sons, MPA, RN;Joseph Lou Miner, [place] Exercise Physiologist    Virtual Visit No    Medication changes reported     No    Fall or balance concerns reported    No    Warm-up and Cool-down Performed on first and last piece of equipment    Resistance Training Performed Yes    VAD Patient? No    PAD/SET Patient? No      Pain Assessment   Currently in Pain? No/denies              Social History   Tobacco Use  Smoking Status Never Smoker  Smokeless Tobacco Never Used    Goals Met:  Independence with exercise equipment Exercise tolerated well No report of cardiac concerns or symptoms Strength training completed today  Goals Unmet:  Not Applicable  Comments: Pt able to follow exercise prescription today without complaint.  Will continue to monitor for progression.    Dr. Emily Filbert is Medical Director for [place] and [facility].

## 2020-07-30 ENCOUNTER — Encounter: Payer: Self-pay | Admitting: Cardiothoracic Surgery

## 2020-07-30 ENCOUNTER — Encounter: Payer: 59 | Admitting: *Deleted

## 2020-07-30 ENCOUNTER — Ambulatory Visit (INDEPENDENT_AMBULATORY_CARE_PROVIDER_SITE_OTHER): Payer: Self-pay | Admitting: Cardiothoracic Surgery

## 2020-07-30 VITALS — BP 145/82 | HR 75 | Temp 97.7°F | Resp 20 | Wt 192.0 lb

## 2020-07-30 DIAGNOSIS — Z951 Presence of aortocoronary bypass graft: Secondary | ICD-10-CM

## 2020-07-30 NOTE — Progress Notes (Signed)
Daily Session Note  Patient Details  Name: Clayton Powell MRN: [id number] Date of Birth: [birth year] Referring Provider:   Flowsheet Row Cardiac Rehab from 06/30/2020 in [facility] Cardiac and Pulmonary Rehab  Referring Provider Serafina Royals MD      Encounter Date: 07/30/2020  Check In:  Session Check In - 07/30/20 1713      Check-In   Supervising physician immediately available to respond to emergencies See telemetry face sheet for immediately available ER MD    Location [facility]-Cardiac & Pulmonary Rehab    Staff Present Heath Lark, RN, BSN, Jacklynn Bue, MS Exercise Physiologist;Joseph Tessie Fass RCP,RRT,BSRT    Virtual Visit No    Medication changes reported     No    Fall or balance concerns reported    No    Warm-up and Cool-down Performed on first and last piece of equipment    Resistance Training Performed Yes    VAD Patient? No    PAD/SET Patient? No      Pain Assessment   Currently in Pain? No/denies              Social History   Tobacco Use  Smoking Status Never Smoker  Smokeless Tobacco Never Used    Goals Met:  Independence with exercise equipment Exercise tolerated well No report of cardiac concerns or symptoms  Goals Unmet:  Not Applicable  Comments: Pt able to follow exercise prescription today without complaint.  Will continue to monitor for progression.    Dr. Emily Filbert is Medical Director for [place] and [facility].

## 2020-07-30 NOTE — Progress Notes (Signed)
[address]       [place] [address]             [phone]      WIATT MAHABIR [facility] Medical Record #[id number] Date of Birth: 1946/01/25  Referring: Joaquim Nam, MD Primary Care: Joaquim Nam, MD Primary Cardiologist: Lamar Blinks, MD   Chief Complaint:   POST OP FOLLOW UP OPERATIVE REPORT DATE OF PROCEDURE:  05/29/2020 PREOPERATIVE DIAGNOSIS:  Coronary occlusive disease with unstable angina. POSTOPERATIVE DIAGNOSIS:  Coronary occlusive disease with unstable angina. SURGICAL PROCEDURE:  Coronary artery bypass grafting x3 with the left internal mammary to the left anterior descending coronary artery, reverse saphenous vein graft to the ramus coronary artery, reverse saphenous vein graft to the distal right coronary  artery, with right greater saphenous endoscopic vein harvesting, right thigh and calf, and incidental lymph node biopsy of internal mammary left lymph node  History of Present Illness:   Patient returns to the office today in follow-up after his coronary artery bypass grafting done November 5 for unstable angina.  He is making good progress postoperatively.  Actively engaged in cardiac rehab through [place].  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   Chest wall discomfort he had noted previously after surgery has resolved, he has had no recurrent angina.  Past Medical History:  Diagnosis Date  . Allergy   . Anginal pain (HCC)   . BPH (benign prostatic hypertrophy) 6/07  . CAD (coronary artery disease) 2010   dx made by coronary calcium scoring, Normal stress test in 2010.   Marland Kitchen GERD (gastroesophageal reflux disease) 12/05  . HLD (hyperlipidemia) 3/99  . HTN (hypertension) 2002   no meds  . Left rotator cuff tear 08/12/2011   Per Dr. Dion Saucier 2013 L rotator cuff tear on MRI 2013   . Right rotator cuff tear 05/24/2013  . Wears glasses      Social History   Tobacco Use   Smoking Status Never Smoker  Smokeless Tobacco Never Used    Social History   Substance and Sexual Activity  Alcohol Use Yes  . Alcohol/week: 0.0 standard drinks   Comment: beer, occ     Allergies  Allergen Reactions  . Ciprofloxacin Hcl Rash  . Quinolones Rash    Current Outpatient Medications  Medication Sig Dispense Refill  . alfuzosin (UROXATRAL) 10 MG 24 hr tablet Take 1 tablet (10 mg total) by mouth daily with breakfast. 90 tablet 3  . aspirin EC 81 MG tablet Take 81 mg by mouth daily. Swallow whole.    . Azelastine-Fluticasone 137-50 MCG/ACT SUSP Place 1 spray into both nostrils 2 (two) times daily.    . calcium citrate-vitamin D (CITRACAL+D) 315-200 MG-UNIT tablet Take 1-2 tablets by mouth 2 (two) times daily.    . Cholecalciferol (VITAMIN D) 2000 UNITS CAPS Take 1 capsule by mouth daily.    . clindamycin (CLEOCIN T) 1 % external solution Apply topically 2 (two) times daily as needed.    . Cyanocobalamin (VITAMIN B-12) 5000 MCG SUBL Place 1 tablet under the tongue daily.    . finasteride (PROSCAR) 5 MG tablet Take 1 tablet (5 mg total) by mouth daily. 90 tablet 3  . icosapent Ethyl (VASCEPA) 1 g capsule Take 1 tablet by mouth 4 (four) times daily.    . Multiple Vitamin (MULTIVITAMIN) tablet Take 1 tablet by mouth daily.    . rosuvastatin (CRESTOR) 40  MG tablet Take 40 mg by mouth daily.     Current Facility-Administered Medications  Medication Dose Route Frequency Provider Last Rate Last Admin  . betamethasone acetate-betamethasone sodium phosphate (CELESTONE) injection 3 mg  3 mg Intramuscular Once Edrick Kins, DPM           Physical Exam: BP (!) 145/82 (BP Location: Right Arm, Patient Position: Sitting, Cuff Size: Normal)   Pulse 75   Temp 97.7 F (36.5 C) (Skin)   Resp 20   Wt 192 lb (87.1 kg)   SpO2 97% Comment: RA  BMI 29.54 kg/m   General appearance: alert, cooperative and no distress Resp: clear to auscultation bilaterally Cardio: regular rate  and rhythm, S1, S2 normal, no murmur, click, rub or gallop Extremities: extremities normal, atraumatic, no cyanosis or edema Neurologic: Grossly normal   Diagnostic Studies & Laboratory data:     Recent Radiology Findings:  DG Chest 2 View  Result Date: 06/25/2020 CLINICAL DATA:  Status post CABG EXAM: CHEST - 2 VIEW COMPARISON:  Chest radiograph June 01, 2020 FINDINGS: Changes of prior median sternotomy and CABG. Heart size and mediastinal contours are within normal limits. Small left p pleural effusion with adjacent compressive atelectasis. Thoracic spondylosis. IMPRESSION: Small left pleural effusion with adjacent compressive atelectasis. Electronically Signed   By: Dahlia Bailiff MD   On: 06/25/2020 11:32   I have independently reviewed the above radiology studies  and reviewed the findings with the patient.   Recent Lab Findings: Lab Results  Component Value Date   WBC 9.5 06/01/2020   HGB 10.8 (L) 06/01/2020   HCT 32.9 (L) 06/01/2020   PLT 141 (L) 06/01/2020   GLUCOSE 102 (H) 06/02/2020   CHOL 143 05/26/2020   TRIG 141 05/26/2020   HDL 52 05/26/2020   LDLCALC 63 05/26/2020   ALT 29 05/25/2020   AST 34 05/25/2020   NA 140 06/02/2020   K 3.9 06/02/2020   CL 106 06/02/2020   CREATININE 0.84 06/02/2020   BUN 18 06/02/2020   CO2 25 06/02/2020   TSH 4.344 06/02/2020   INR 1.4 (H) 05/29/2020   HGBA1C 5.4 05/28/2020      Assessment / Plan:  #1 status post coronary artery bypass grafting November 2021-making good progress in cardiac rehab and return to work full-time #2 resolved issues with urinary retention from prostatic hypertrophy and Foley catheter trauma-he has follow-up with urology    Medication Changes: No orders of the defined types were placed in this encounter.     Grace Isaac MD      [address] [place], [address] Office [phone]     07/30/2020 2:46 PM

## 2020-07-31 ENCOUNTER — Ambulatory Visit: Payer: 59 | Admitting: Family Medicine

## 2020-08-03 ENCOUNTER — Other Ambulatory Visit: Payer: Self-pay

## 2020-08-03 DIAGNOSIS — Z951 Presence of aortocoronary bypass graft: Secondary | ICD-10-CM | POA: Diagnosis not present

## 2020-08-03 NOTE — Progress Notes (Signed)
Daily Session Note  Patient Details  Name: Clayton Powell MRN: [id number] Date of Birth: 1945/08/18 Referring Provider:   Flowsheet Row Cardiac Rehab from 06/30/2020 in [facility] Cardiac and Pulmonary Rehab  Referring Provider Serafina Royals MD      Encounter Date: 08/03/2020  Check In:  Session Check In - 08/03/20 1719      Check-In   Supervising physician immediately available to respond to emergencies See telemetry face sheet for immediately available ER MD    Location [facility]-Cardiac & Pulmonary Rehab    Staff Present Birdie Sons, MPA, Mauricia Area, BS, ACSM CEP, Exercise Physiologist;Kara Eliezer Bottom, MS Exercise Physiologist    Virtual Visit No    Medication changes reported     No    Fall or balance concerns reported    No    Warm-up and Cool-down Performed on first and last piece of equipment    Resistance Training Performed Yes    VAD Patient? No    PAD/SET Patient? No      Pain Assessment   Currently in Pain? No/denies              Social History   Tobacco Use  Smoking Status Never Smoker  Smokeless Tobacco Never Used    Goals Met:  Independence with exercise equipment Exercise tolerated well No report of cardiac concerns or symptoms Strength training completed today  Goals Unmet:  Not Applicable  Comments: Pt able to follow exercise prescription today without complaint.  Will continue to monitor for progression.    Dr. Emily Filbert is Medical Director for [place] and [facility].

## 2020-08-05 ENCOUNTER — Other Ambulatory Visit: Payer: Self-pay

## 2020-08-05 ENCOUNTER — Ambulatory Visit: Payer: 59 | Admitting: Urology

## 2020-08-05 ENCOUNTER — Encounter: Payer: Self-pay | Admitting: Urology

## 2020-08-05 VITALS — BP 167/88 | HR 76 | Ht 66.0 in | Wt 187.0 lb

## 2020-08-05 DIAGNOSIS — N401 Enlarged prostate with lower urinary tract symptoms: Secondary | ICD-10-CM | POA: Diagnosis not present

## 2020-08-05 DIAGNOSIS — Z951 Presence of aortocoronary bypass graft: Secondary | ICD-10-CM | POA: Diagnosis not present

## 2020-08-05 LAB — BLADDER SCAN AMB NON-IMAGING

## 2020-08-05 NOTE — Progress Notes (Signed)
   08/05/2020 1:36 PM   Roff 12-15-1945 [id number]  Reason for visit: Follow up BPH  HPI: I saw Clayton Powell in urology clinic today for follow-up of BPH.  He has a long history of bothersome urinary symptoms of weak stream, feeling of incomplete emptying, nocturia.  He is on maximal medical therapy with alfuzosin, finasteride, and Cialis.  He underwent cystoscopy and TRUS previously that showed a large prostate with obstructing lateral lobes, and measured 120 g.  He previously was considering HOLEP, but opted to hold off because work had gotten busy at [employer].  He was hospitalized in [place] in December for chest pain and ultimately underwent CABG.  This was complicated by significant problems with his catheter and what sounds like clot retention/bladder spasms, and Dr. Louis Meckel was consulted.  His symptoms ultimately resolved once the catheter was removed.  He is returned to his baseline BPH symptoms.  We discussed the risks and benefits of HoLEP at length.  The procedure requires general anesthesia and takes 2 to 3 hours, and a holmium laser is used to enucleate the prostate and push this tissue into the bladder.  A morcellator is then used to remove this tissue, which is sent for pathology.  The vast majority of patients are able to discharge the same day with a catheter in place for 2 to 3 days, and will follow-up in clinic for a voiding trial.  Approximately 5% of patients will be admitted overnight to monitor the urine, or if they have multiple co-morbidities.  We specifically discussed the risks of bleeding, infection, retrograde ejaculation, temporary urgency and urge incontinence, very low risk of long-term incontinence, pathologic evaluation of prostate tissue and possible detection of prostate cancer or other malignancy, and possible need for additional procedures.  He would like to hold off on pursuing any surgery while he is recovering after CABG, but he would like to  consider this again in the next few months.  Virtual visit 6 months to re-discuss HOLEP timing.   Billey Co, [facility] [address], [place], [place] [address] [phone]

## 2020-08-05 NOTE — Progress Notes (Signed)
Daily Session Note  Patient Details  Name: Clayton Powell MRN: [id number] Date of Birth: 1946/03/01 Referring Provider:   Flowsheet Row Cardiac Rehab from 06/30/2020 in [facility] Cardiac and Pulmonary Rehab  Referring Provider Serafina Royals MD      Encounter Date: 08/05/2020  Check In:  Session Check In - 08/05/20 1642      Check-In   Supervising physician immediately available to respond to emergencies See telemetry face sheet for immediately available ER MD    Location [facility]-Cardiac & Pulmonary Rehab    Staff Present Birdie Sons, MPA, Elveria Rising, BA, ACSM CEP, Exercise Physiologist;Kara Eliezer Bottom, MS Exercise Physiologist    Virtual Visit No    Medication changes reported     No    Fall or balance concerns reported    No    Warm-up and Cool-down Performed on first and last piece of equipment    Resistance Training Performed Yes    VAD Patient? No    PAD/SET Patient? No      Pain Assessment   Currently in Pain? No/denies              Social History   Tobacco Use  Smoking Status Never Smoker  Smokeless Tobacco Never Used    Goals Met:  Independence with exercise equipment Exercise tolerated well No report of cardiac concerns or symptoms Strength training completed today  Goals Unmet:  Not Applicable  Comments: Pt able to follow exercise prescription today without complaint.  Will continue to monitor for progression.    Dr. Emily Filbert is Medical Director for [place] and [facility].

## 2020-08-05 NOTE — Patient Instructions (Signed)

## 2020-08-06 ENCOUNTER — Encounter: Payer: 59 | Admitting: *Deleted

## 2020-08-06 DIAGNOSIS — Z951 Presence of aortocoronary bypass graft: Secondary | ICD-10-CM | POA: Diagnosis not present

## 2020-08-06 NOTE — Progress Notes (Signed)
Daily Session Note  Patient Details  Name: Clayton Powell MRN: [id number] Date of Birth: 04/14/1946 Referring Provider:   Flowsheet Row Cardiac Rehab from 06/30/2020 in [facility] Cardiac and Pulmonary Rehab  Referring Provider Serafina Royals MD      Encounter Date: 08/06/2020  Check In:  Session Check In - 08/06/20 1801      Check-In   Supervising physician immediately available to respond to emergencies See telemetry face sheet for immediately available ER MD    Location [facility]-Cardiac & Pulmonary Rehab    Staff Present Heath Lark, RN, BSN, CCRP;Joseph Lou Miner, [place] Exercise Physiologist    Virtual Visit No    Medication changes reported     No    Fall or balance concerns reported    No    Warm-up and Cool-down Performed on first and last piece of equipment    Resistance Training Performed Yes    VAD Patient? No    PAD/SET Patient? No      Pain Assessment   Currently in Pain? No/denies             Exercise Prescription Changes - 08/06/20 1700      Home Exercise Plan   Plans to continue exercise at Home (comment)   Tread climber, walking   Frequency Add 2 additional days to program exercise sessions.    Initial Home Exercises Provided 08/06/20           Social History   Tobacco Use  Smoking Status Never Smoker  Smokeless Tobacco Never Used    Goals Met:  Independence with exercise equipment Exercise tolerated well No report of cardiac concerns or symptoms  Goals Unmet:  Not Applicable  Comments: Pt able to follow exercise prescription today without complaint.  Will continue to monitor for progression.    Dr. Emily Filbert is Medical Director for [place] and [facility].

## 2020-08-11 ENCOUNTER — Other Ambulatory Visit: Payer: Self-pay

## 2020-08-11 ENCOUNTER — Ambulatory Visit: Payer: 59 | Admitting: Family Medicine

## 2020-08-11 ENCOUNTER — Encounter: Payer: Self-pay | Admitting: Family Medicine

## 2020-08-11 VITALS — BP 118/80 | HR 73 | Temp 97.6°F | Ht 66.0 in | Wt 192.0 lb

## 2020-08-11 DIAGNOSIS — R0989 Other specified symptoms and signs involving the circulatory and respiratory systems: Secondary | ICD-10-CM | POA: Diagnosis not present

## 2020-08-11 NOTE — Progress Notes (Signed)
This visit occurred during the SARS-CoV-2 public health emergency.  Safety protocols were in place, including screening questions prior to the visit, additional usage of staff PPE, and extensive cleaning of exam room while observing appropriate contact time as indicated for disinfecting solutions.  Leg sx.  He had seen ortho, per patient was told he had sciatica.  R lateral foreleg cramping, with PT f/u pending.  He has some R buttock pain.  SI stretch helps. No L sided sx.  He has sx at rest, meaning some occasional cramping at rest in the right lateral shin.  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  Has been in cardiac rehab.  His mood is good.  No CP, not SOB.  He is grateful to the triage staff here at the clinic who directed him to get evaluated.  Urinary sx after foley placement, d/w pt.    He is still working, d/w pt.  He still enjoys his work.    He is seen orthopedics about a possible left knee meniscus injury but that is separate from the right leg symptoms described above.  Meds, vitals, and allergies reviewed.   ROS: Per HPI unless specifically indicated in ROS section   GEN: nad, alert and oriented HEENT: ncat NECK: supple w/o LA CV: rrr.  no murmur, healed midline sternotomy scar noted. PULM: ctab, no inc wob ABD: soft, +bs EXT: no edema SKIN: no acute rash Normal and intact radial pulses bilaterally.  He has decrease posterior tibial and dorsalis pedis pulses bilaterally but still has normal skin perfusion/capillary refill in the feet.  30 minutes were devoted to patient care in this encounter (this includes time spent reviewing the patient's file/history, interviewing and examining the patient, counseling/reviewing plan with patient).

## 2020-08-11 NOTE — Patient Instructions (Signed)
We'll call about getting an ultrasound set up.  Limit heavy exertion in the meantime.   Take care.  Glad to see you.

## 2020-08-12 DIAGNOSIS — Z951 Presence of aortocoronary bypass graft: Secondary | ICD-10-CM

## 2020-08-12 NOTE — Progress Notes (Signed)
Daily Session Note  Patient Details  Name: Clayton Powell MRN: [id number] Date of Birth: Jun 01, 1946 Referring Provider:   Flowsheet Row Cardiac Rehab from 06/30/2020 in [facility] Cardiac [facility] Pul[facility] Rehab  Referring Provider Serafina Royals MD      Encounter Date: 08/12/2020  Check In:  Session Check In - 08/12/20 1639      Check-In   Supervising physician immediately available to respond to emergencies See telemetry face sheet for immediately available ER MD    Location [facility]-Cardiac & Pulmonary Rehab    Staff Present Birdie Sons, MPA, RN;Joseph Lou Miner, MS Exercise Physiologist    Virtual Visit No    Medication changes reported     No    Fall or balance concerns reported    No    Warm-up and Cool-down Performed on first and last piece of equipment    Resistance Training Performed Yes    VAD Patient? No    PAD/SET Patient? No      Pain Assessment   Currently in Pain? No/denies              Social History   Tobacco Use  Smoking Status Never Smoker  Smokeless Tobacco Never Used    Goals Met:  Independence with exercise equipment Exercise tolerated well Personal goals reviewed No report of cardiac concerns or symptoms Strength training completed today  Goals Unmet:  Not Applicable  Comments: Pt able to follow exercise prescription today without complaint.  Will continue to monitor for progression.    Dr. Emily Filbert is Medical Director for [facility].

## 2020-08-13 ENCOUNTER — Other Ambulatory Visit: Payer: Self-pay | Admitting: Family Medicine

## 2020-08-13 ENCOUNTER — Telehealth: Payer: Self-pay

## 2020-08-13 ENCOUNTER — Other Ambulatory Visit: Payer: Self-pay

## 2020-08-13 ENCOUNTER — Ambulatory Visit (INDEPENDENT_AMBULATORY_CARE_PROVIDER_SITE_OTHER): Payer: 59

## 2020-08-13 DIAGNOSIS — R0989 Other specified symptoms and signs involving the circulatory and respiratory systems: Secondary | ICD-10-CM

## 2020-08-13 NOTE — Assessment & Plan Note (Signed)
He still could have a meniscus injury in the left leg and he still could have sciatica on the right but in the setting of decreased dorsalis pedis pulses bilaterally with known coronary disease it would be reasonable to limit heavy exertion in the meantime and proceed with arterial Doppler of lower extremities.  Rationale discussed with patient.  He agrees with plan.  He is not having chest pain or shortness of breath.  Okay for outpatient follow-up.  Routine cautions given to patient.

## 2020-08-13 NOTE — Telephone Encounter (Signed)
Attempted to schedule.  Patient not home.  Will attempt another time.     Patient needs LEA 60 minutes within 2 weeks per Dominica Severin .

## 2020-08-19 ENCOUNTER — Other Ambulatory Visit: Payer: Self-pay

## 2020-08-19 ENCOUNTER — Encounter: Payer: Self-pay | Admitting: *Deleted

## 2020-08-19 DIAGNOSIS — Z951 Presence of aortocoronary bypass graft: Secondary | ICD-10-CM

## 2020-08-19 NOTE — Progress Notes (Signed)
Daily Session Note  Patient Details  Name: DEQUAVION FOLLETTE MRN: [id number] Date of Birth: [birth year] Referring Provider:   Flowsheet Row Cardiac Rehab from 06/30/2020 in [facility] Cardiac and Pulmonary Rehab  Referring Provider Serafina Royals MD      Encounter Date: 08/19/2020  Check In:  Session Check In - 08/19/20 1620      Check-In   Supervising physician immediately available to respond to emergencies See telemetry face sheet for immediately available ER MD    Location [facility]-Cardiac & Pulmonary Rehab    Staff Present Birdie Sons, MPA, Elveria Rising, BA, ACSM CEP, Exercise Physiologist;Kara Eliezer Bottom, MS Exercise Physiologist    Virtual Visit No    Medication changes reported     No    Fall or balance concerns reported    No    Warm-up and Cool-down Performed on first and last piece of equipment    Resistance Training Performed Yes    VAD Patient? No    PAD/SET Patient? No      Pain Assessment   Currently in Pain? No/denies              Social History   Tobacco Use  Smoking Status Never Smoker  Smokeless Tobacco Never Used    Goals Met:  Independence with exercise equipment Exercise tolerated well No report of cardiac concerns or symptoms Strength training completed today  Goals Unmet:  Not Applicable  Comments: Pt able to follow exercise prescription today without complaint.  Will continue to monitor for progression.    Dr. Emily Filbert is Medical Director for [facility].

## 2020-08-19 NOTE — Progress Notes (Signed)
Cardiac Individual Treatment Plan  Patient Details  Name: Clayton Powell MRN: [id number] Date of Birth: [birth year] Referring Provider:   Flowsheet Row Cardiac Rehab from 06/30/2020 in [facility] Cardiac and Pulmonary Rehab  Referring Provider Serafina Royals MD      Initial Encounter Date:  Flowsheet Row Cardiac Rehab from 06/30/2020 in [facility] Cardiac and Pulmonary Rehab  Date 06/30/20      Visit Diagnosis: S/P CABG x 3  Patient's Home Medications on Admission:  Current Outpatient Medications:  .  alfuzosin (UROXATRAL) 10 MG 24 hr tablet, Take 1 tablet (10 mg total) by mouth daily with breakfast., Disp: 90 tablet, Rfl: 3 .  aspirin EC 81 MG tablet, Take 81 mg by mouth daily. Swallow whole., Disp: , Rfl:  .  Azelastine-Fluticasone 137-50 MCG/ACT SUSP, Place 1 spray into both nostrils 2 (two) times daily., Disp: , Rfl:  .  calcium citrate-vitamin D (CITRACAL+D) 315-200 MG-UNIT tablet, Take 1-2 tablets by mouth 2 (two) times daily., Disp: , Rfl:  .  Cholecalciferol (VITAMIN D) 2000 UNITS CAPS, Take 1 capsule by mouth daily., Disp: , Rfl:  .  Cyanocobalamin (VITAMIN B-12) 5000 MCG SUBL, Place 1 tablet under the tongue daily., Disp: , Rfl:  .  finasteride (PROSCAR) 5 MG tablet, Take 1 tablet (5 mg total) by mouth daily., Disp: 90 tablet, Rfl: 3 .  icosapent Ethyl (VASCEPA) 1 g capsule, Take 1 tablet by mouth 4 (four) times daily., Disp: , Rfl:  .  Multiple Vitamin (MULTIVITAMIN) tablet, Take 1 tablet by mouth daily., Disp: , Rfl:  .  rosuvastatin (CRESTOR) 40 MG tablet, Take 40 mg by mouth daily., Disp: , Rfl:   Past Medical History: Past Medical History:  Diagnosis Date  . Allergy   . Anginal pain ([place])   . BPH (benign prostatic hypertrophy) 6/07  . CAD (coronary artery disease) 2010   dx made by coronary calcium scoring, Normal stress test in 2010.   Marland Kitchen GERD (gastroesophageal reflux disease) 12/05  . HLD (hyperlipidemia) 3/99  . HTN (hypertension) 2002   no meds  . Left rotator cuff  tear 08/12/2011   Per Dr. Mardelle Matte 2013 L rotator cuff tear on MRI 2013   . Right rotator cuff tear 05/24/2013  . Wears glasses     Tobacco Use: Social History   Tobacco Use  Smoking Status Never Smoker  Smokeless Tobacco Never Used    Labs: Recent Review Flowsheet Data    Labs for ITP Cardiac and Pulmonary Rehab Latest Ref Rng & Units 05/29/2020 05/29/2020 05/29/2020 05/29/2020 05/29/2020   Cholestrol 0 - 200 mg/dL - - - - -   LDLCALC 0 - 99 mg/dL - - - - -   HDL >40 mg/dL - - - - -   Trlycerides <150 mg/dL - - - - -   Hemoglobin A1c 4.8 - 5.6 % - - - - -   PHART 7.350 - 7.450 7.342(L) - 7.305(L) 7.363 7.356   PCO2ART 32.0 - 48.0 mmHg 40.7 - 48.4(H) 38.8 35.6   HCO3 20.0 - 28.0 mmol/L 22.0 - 24.2 22.2 19.9(L)   TCO2 22 - 32 mmol/L 23 21(L) 26 23 21(L)   ACIDBASEDEF 0.0 - 2.0 mmol/L 3.0(H) - 3.0(H) 3.0(H) 5.0(H)   O2SAT % 94.0 - 98.0 96.0 97.0       Exercise Target Goals: Exercise Program Goal: Individual exercise prescription set using results from initial 6 min walk test and THRR while considering  patient's activity barriers and safety.   Exercise Prescription Goal:  Initial exercise prescription builds to 30-45 minutes a day of aerobic activity, 2-3 days per week.  Home exercise guidelines will be given to patient during program as part of exercise prescription that the participant will acknowledge.   Education: Aerobic Exercise: - Group verbal and visual presentation on the components of exercise prescription. Introduces F.I.T.T principle from ACSM for exercise prescriptions.  Reviews F.I.T.T. principles of aerobic exercise including progression. Written material given at graduation. Flowsheet Row Cardiac Rehab from 08/12/2020 in [facility] Cardiac and Pulmonary Rehab  Date 07/22/20  Educator AS  Instruction Review Code 1- Verbalizes Understanding      Education: Resistance Exercise: - Group verbal and visual presentation on the components of exercise prescription. Introduces  F.I.T.T principle from ACSM for exercise prescriptions  Reviews F.I.T.T. principles of resistance exercise including progression. Written material given at graduation. Flowsheet Row Cardiac Rehab from 08/12/2020 in [facility] Cardiac and Pulmonary Rehab  Date 07/15/20  Educator [facility]  Instruction Review Code 1- Verbalizes Understanding       Education: Exercise & Equipment Safety: - Individual verbal instruction and demonstration of equipment use and safety with use of the equipment. Flowsheet Row Cardiac Rehab from 08/12/2020 in [facility] Cardiac and Pulmonary Rehab  Date 06/30/20  Educator [facility]  Instruction Review Code 1- Verbalizes Understanding      Education: Exercise Physiology & General Exercise Guidelines: - Group verbal and written instruction with models to review the exercise physiology of the cardiovascular system and associated critical values. Provides general exercise guidelines with specific guidelines to those with heart or lung disease.  Flowsheet Row Cardiac Rehab from 08/12/2020 in [facility] Cardiac and Pulmonary Rehab  Education need identified 06/29/20  Date 07/08/20  Educator [facility]  Instruction Review Code 1- [employer] Understanding      Education: Flexibility, Balance, Mind/Body Relaxation: - Group verbal and visual presentation with interactive activity on the components of exercise prescription. Introduces F.I.T.T principle from ACSM for exercise prescriptions. Reviews F.I.T.T. principles of flexibility and balance exercise training including progression. Also discusses the mind body connection.  Reviews various relaxation techniques to help reduce and manage stress (i.e. Deep breathing, progressive muscle relaxation, and visualization). Balance handout provided to take home. Written material given at graduation. Flowsheet Row Cardiac Rehab from 08/12/2020 in [facility] Cardiac and Pulmonary Rehab  Date 07/29/20  Educator AS  Instruction Review Code 1- Verbalizes Understanding       Activity Barriers & Risk Stratification:  Activity Barriers & Cardiac Risk Stratification - 06/30/20 1446      Activity Barriers & Cardiac Risk Stratification   Activity Barriers Incisional Pain;Deconditioning;Other (comment)    Comments previous bone spur surgery (cause pain on R leg from thigh to calf)    Cardiac Risk Stratification High           6 Minute Walk:  6 Minute Walk    Row Name 06/30/20 1445         6 Minute Walk   Phase Initial     Distance 1510 feet     Walk Time 6 minutes     # of Rest Breaks 0     MPH 2.86     METS 3.24     RPE 13     Perceived Dyspnea  0     VO2 Peak 11.33     Symptoms Yes (comment)     Comments bone spur/calf bothersome 6/10     Resting HR 70 bpm     Resting BP 134/72     Resting Oxygen  Saturation  98 %     Exercise Oxygen Saturation  during 6 min walk 98 %     Max Ex. HR 119 bpm     Max Ex. BP 142/64     2 Minute Post BP 122/66            Oxygen Initial Assessment:   Oxygen Re-Evaluation:   Oxygen Discharge (Final Oxygen Re-Evaluation):   Initial Exercise Prescription:  Initial Exercise Prescription - 06/30/20 1400      Date of Initial Exercise RX and Referring Provider   Date 06/30/20    Referring Provider Serafina Royals MD      Treadmill   MPH 2.7    Grade 0.5    Minutes 15    METs 3.25      Elliptical   Level 1    Speed 2.9    Minutes 15    METs 3.2      T5 Nustep   Level 3    SPM 80    Minutes 15    METs 3.2      Prescription Details   Frequency (times per week) 3    Duration Progress to 30 minutes of continuous aerobic without signs/symptoms of physical distress      Intensity   THRR 40-80% of Max Heartrate 100-131    Ratings of Perceived Exertion 11-13    Perceived Dyspnea 0-4      Progression   Progression Continue to progress workloads to maintain intensity without signs/symptoms of physical distress.      Resistance Training   Training Prescription Yes    Weight 4 lb    Reps  10-15           Perform Capillary Blood Glucose checks as needed.  Exercise Prescription Changes:  Exercise Prescription Changes    Row Name 06/30/20 1400 07/14/20 0700 07/27/20 1400 08/06/20 1700 08/10/20 1200     Response to Exercise   Blood Pressure (Admit) 134/72 100/60 104/60 - 118/64   Blood Pressure (Exercise) 142/64 128/64 142/66 - 142/62   Blood Pressure (Exit) 122/66 94/56 108/64 - 120/62   Heart Rate (Admit) 70 bpm 73 bpm 65 bpm - 81 bpm   Heart Rate (Exercise) 119 bpm 116 bpm 116 bpm - 115 bpm   Heart Rate (Exit) 80 bpm 92 bpm 78 bpm - 88 bpm   Oxygen Saturation (Admit) 98 % - - - -   Oxygen Saturation (Exercise) 98 % - - - -   Rating of Perceived Exertion (Exercise) _0 - 12   Perceived Dyspnea (Exercise) 0 - - - -   Symptoms bone spur/calf bothersome 6/10 none none - none   Duration - Continue with 30 min of aerobic exercise without signs/symptoms of physical distress. Continue with 30 min of aerobic exercise without signs/symptoms of physical distress. - Continue with 30 min of aerobic exercise without signs/symptoms of physical distress.   Intensity - THRR unchanged THRR unchanged - THRR unchanged     Progression   Progression - Continue to progress workloads to maintain intensity without signs/symptoms of physical distress. Continue to progress workloads to maintain intensity without signs/symptoms of physical distress. - Continue to progress workloads to maintain intensity without signs/symptoms of physical distress.   Average METs - 2.98 3.4 - 4.17     Resistance Training   Training Prescription - Yes Yes - Yes   Weight - 4 lb 6 lb - 6 lb   Reps - 10-15 10-15 - 10-15  Interval Training   Interval Training - No No - No     Treadmill   MPH - 2.7 3 - 3   Grade - 0.5 2 - 3   Minutes - 15 15 - 15   METs - 3.25 4.12 - 4.54     Elliptical   Level - 1 1 - 1   Speed - 2.9 2.9 - 3.5   Minutes - 15 15 - 15   METs - 2.7 2.7 - 3.8     T5 Nustep    Level - 3 - - -   Minutes - 15 - - -   METs - 3 - - -     Home Exercise Plan   Plans to continue exercise at - - - Home (comment)  Tread climber, walking Home (comment)  Tread climber, walking   Frequency - - - Add 2 additional days to program exercise sessions. Add 2 additional days to program exercise sessions.   Initial Home Exercises Provided - - - 08/06/20 08/06/20          Exercise Comments:  Exercise Comments    Row Name 07/01/20 1116           Exercise Comments First full day of exercise!  Patient was oriented to gym and equipment including functions, settings, policies, and procedures.  Patient's individual exercise prescription and treatment plan were reviewed.  All starting workloads were established based on the results of the 6 minute walk test done at initial orientation visit.  The plan for exercise progression was also introduced and progression will be customized based on patient's performance and goals.              Exercise Goals and Review:  Exercise Goals    Row Name 06/30/20 1448             Exercise Goals   Increase Physical Activity Yes       Intervention Provide advice, education, support and counseling about physical activity/exercise needs.;Develop an individualized exercise prescription for aerobic and resistive training based on initial evaluation findings, risk stratification, comorbidities and participant's personal goals.       Expected Outcomes Short Term: Attend rehab on a regular basis to increase amount of physical activity.;Long Term: Add in home exercise to make exercise part of routine and to increase amount of physical activity.;Long Term: Exercising regularly at least 3-5 days a week.       Increase Strength and Stamina Yes       Intervention Provide advice, education, support and counseling about physical activity/exercise needs.;Develop an individualized exercise prescription for aerobic and resistive training based on initial  evaluation findings, risk stratification, comorbidities and participant's personal goals.       Expected Outcomes Short Term: Increase workloads from initial exercise prescription for resistance, speed, and METs.;Long Term: Improve cardiorespiratory fitness, muscular endurance and strength as measured by increased METs and functional capacity (6MWT);Short Term: Perform resistance training exercises routinely during rehab and add in resistance training at home       Able to understand and use rate of perceived exertion (RPE) scale Yes       Intervention Provide education and explanation on how to use RPE scale       Expected Outcomes Short Term: Able to use RPE daily in rehab to express subjective intensity level;Long Term:  Able to use RPE to guide intensity level when exercising independently       Able to understand and use Dyspnea  scale Yes       Intervention Provide education and explanation on how to use Dyspnea scale       Expected Outcomes Short Term: Able to use Dyspnea scale daily in rehab to express subjective sense of shortness of breath during exertion;Long Term: Able to use Dyspnea scale to guide intensity level when exercising independently       Knowledge and understanding of Target Heart Rate Range (THRR) Yes       Intervention Provide education and explanation of THRR including how the numbers were predicted and where they are located for reference       Expected Outcomes Short Term: Able to state/look up THRR;Short Term: Able to use daily as guideline for intensity in rehab;Long Term: Able to use THRR to govern intensity when exercising independently       Able to check pulse independently Yes       Intervention Provide education and demonstration on how to check pulse in carotid and radial arteries.;Review the importance of being able to check your own pulse for safety during independent exercise       Expected Outcomes Short Term: Able to explain why pulse checking is important  during independent exercise;Long Term: Able to check pulse independently and accurately       Understanding of Exercise Prescription Yes       Intervention Provide education, explanation, and written materials on patient's individual exercise prescription       Expected Outcomes Short Term: Able to explain program exercise prescription;Long Term: Able to explain home exercise prescription to exercise independently              Exercise Goals Re-Evaluation :  Exercise Goals Re-Evaluation    Row Name 07/01/20 1116 07/14/20 0743 07/27/20 1416 08/06/20 1742 08/10/20 1207     Exercise Goal Re-Evaluation   Exercise Goals Review Increase Physical Activity;Able to understand and use rate of perceived exertion (RPE) scale;Knowledge and understanding of Target Heart Rate Range (THRR);Understanding of Exercise Prescription;Increase Strength and Stamina;Able to understand and use Dyspnea scale;Able to check pulse independently Increase Physical Activity;Increase Strength and Stamina;Understanding of Exercise Prescription Increase Physical Activity;Increase Strength and Stamina Increase Physical Activity;Increase Strength and Stamina Increase Physical Activity;Increase Strength and Stamina;Understanding of Exercise Prescription   Comments Reviewed RPE and dyspnea scales, THR and program prescription with pt today.  Pt voiced understanding and was given a copy of goals to take home. Clayton Powell is off to a great start in rehab.  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  We will continue to monitor his progress. Verdene Lennert steady progress and improved average METs.  He has moved up to 6 lb weights for strength work.  We will continue to monitor progress. Reviewed home exercise with pt today.  Pt plans to walk and use his treadclimber at home for exercise.  Reviewed THR, pulse, RPE, sign and symptoms, pulse oximetery and when to call 911 or MD.  Also discussed weather  considerations and indoor options.  Pt voiced understanding. Clayton Powell has been doing well in rehab.  He switched classes after going back to work.  He is really enjoying the elliptical and has requested to use it each day.  He is up to 3.8 METs on it already.  We will continue to monitor his progress.   Expected Outcomes Short: Use RPE daily to regulate intensity. Long: Follow program prescription in THR. Short: Review home exercise guidelines  and improve workloads Long: Continue to follow program prescription Short : continue to progress slowly Long:  continue to improve MET level - Short: Continue improve stamina on elliptical Long: Continue to improve stamina.          Discharge Exercise Prescription (Final Exercise Prescription Changes):  Exercise Prescription Changes - 08/10/20 1200      Response to Exercise   Blood Pressure (Admit) 118/64    Blood Pressure (Exercise) 142/62    Blood Pressure (Exit) 120/62    Heart Rate (Admit) 81 bpm    Heart Rate (Exercise) 115 bpm    Heart Rate (Exit) 88 bpm    Rating of Perceived Exertion (Exercise) 12    Symptoms none    Duration Continue with 30 min of aerobic exercise without signs/symptoms of physical distress.    Intensity THRR unchanged      Progression   Progression Continue to progress workloads to maintain intensity without signs/symptoms of physical distress.    Average METs 4.17      Resistance Training   Training Prescription Yes    Weight 6 lb    Reps 10-15      Interval Training   Interval Training No      Treadmill   MPH 3    Grade 3    Minutes 15    METs 4.54      Elliptical   Level 1    Speed 3.5    Minutes 15    METs 3.8      Home Exercise Plan   Plans to continue exercise at Home (comment)   Tread climber, walking   Frequency Add 2 additional days to program exercise sessions.    Initial Home Exercises Provided 08/06/20           Nutrition:  Target Goals: Understanding of nutrition guidelines, daily  intake of sodium '1500mg'$ , cholesterol '200mg'$ , calories 30% from fat and 7% or less from saturated fats, daily to have 5 or more servings of fruits and vegetables.  Education: All About Nutrition: -Group instruction provided by verbal, written material, interactive activities, discussions, models, and posters to present general guidelines for heart healthy nutrition including fat, fiber, MyPlate, the role of sodium in heart healthy nutrition, utilization of the nutrition label, and utilization of this knowledge for meal planning. Follow up email sent as well. Written material given at graduation. Flowsheet Row Cardiac Rehab from 08/12/2020 in [facility] Cardiac and Pulmonary Rehab  Date 08/05/20  Educator [facility]  Instruction Review Code 1- Verbalizes Understanding      Biometrics:  Pre Biometrics - 06/30/20 1449      Pre Biometrics   Height 5' 7.6" (1.717 m)    Weight 189 lb 12.8 oz (86.1 kg)    BMI (Calculated) 29.2    Single Leg Stand 15.8 seconds            Nutrition Therapy Plan and Nutrition Goals:  Nutrition Therapy & Goals - 07/27/20 1632      Nutrition Therapy   Diet Heart healthy, low Na    Protein (specify units) 70g    Fiber 30 grams    Whole Grain Foods 3 servings    Saturated Fats 12 max. grams    Fruits and Vegetables 8 servings/day    Sodium 1.5 grams      Personal Nutrition Goals   Nutrition Goal ST: modify nighttime snack to be heart healthy LT: honor hunger, increase variety, maintain healthy eating habits    Comments multi-vitamin, vitamin  D, B12, Ca +. B: oatmeal with blueberries. S: Shake with whey protein - with fruit. he eats lots of nonstarchy vegetables, little to no bread. Clayton Powell is diligent about tracking his food and micro/macronutrients. Discussed nutritional needs and heart healthy eating, he was concerned about potassium and iron being low in his diet. Discussed intuitive eating and honoring hunger. He does not like to eat fat like peanut butter due to  calories. He is concerned about his snacking at night - suggested "nice" cream, kale chips, popcorn, greek yogurt and fruit.      Intervention Plan   Intervention Prescribe, educate and counsel regarding individualized specific dietary modifications aiming towards targeted core components such as weight, hypertension, lipid management, diabetes, heart failure and other comorbidities.;Nutrition handout(s) given to patient.    Expected Outcomes Short Term Goal: A plan has been developed with personal nutrition goals set during dietitian appointment.;Long Term Goal: Adherence to prescribed nutrition plan.;Short Term Goal: Understand basic principles of dietary content, such as calories, fat, sodium, cholesterol and nutrients.           Nutrition Assessments:  MEDIFICTS Score Key:  ?70 Need to make dietary changes   40-70 Heart Healthy Diet  ? 40 Therapeutic Level Cholesterol Diet  Flowsheet Row Cardiac Rehab from 06/30/2020 in [facility] Cardiac and Pulmonary Rehab  Picture Your Plate Total Score on Admission 26     Picture Your Plate Scores:  <26 Unhealthy dietary pattern with much room for improvement.  41-50 Dietary pattern unlikely to meet recommendations for good health and room for improvement.  51-60 More healthful dietary pattern, with some room for improvement.   >60 Healthy dietary pattern, although there may be some specific behaviors that could be improved.    Nutrition Goals Re-Evaluation:  Nutrition Goals Re-Evaluation    [place] Name 08/12/20 1729             Goals   Nutrition Goal ST: modify nighttime snack to be heart healthy LT: honor hunger, increase variety, maintain healthy eating habits       Comment Clayton Powell is eating more fruits such as apples, oranges, apples, and pears. Night time snacking still a struggle for him- he is going to try to pay attention to the time at night when he is done eating. He keeps great detail and log of his food intake. He has decreased  sodium under 1500 mg and increased fruit intake.       Expected Outcome Short: Continue manage diet at home Long: Maintain heart healthy diet              Nutrition Goals Discharge (Final Nutrition Goals Re-Evaluation):  Nutrition Goals Re-Evaluation - 08/12/20 1729      Goals   Nutrition Goal ST: modify nighttime snack to be heart healthy LT: honor hunger, increase variety, maintain healthy eating habits    Comment Clayton Powell is eating more fruits such as apples, oranges, apples, and pears. Night time snacking still a struggle for him- he is going to try to pay attention to the time at night when he is done eating. He keeps great detail and log of his food intake. He has decreased sodium under 1500 mg and increased fruit intake.    Expected Outcome Short: Continue manage diet at home Long: Maintain heart healthy diet           Psychosocial: Target Goals: Acknowledge presence or absence of significant depression and/or stress, maximize coping skills, provide positive support system. Participant is able to verbalize  types and ability to use techniques and skills needed for reducing stress and depression.   Education: Stress, Anxiety, and Depression - Group verbal and visual presentation to define topics covered.  Reviews how body is impacted by stress, anxiety, and depression.  Also discusses healthy ways to reduce stress and to treat/manage anxiety and depression.  Written material given at graduation. Flowsheet Row Cardiac Rehab from 08/12/2020 in [facility] Cardiac and Pulmonary Rehab  Date 07/01/20  Educator [facility]  Instruction Review Code 1- [employer] Understanding      Education: Sleep Hygiene -Provides group verbal and written instruction about how sleep can affect your health.  Define sleep hygiene, discuss sleep cycles and impact of sleep habits. Review good sleep hygiene tips.    Initial Review & Psychosocial Screening:  Initial Psych Review & Screening - 06/29/20 1408      Initial  Review   Current issues with None Identified      Family Dynamics   Good Support System? Yes   wife     Barriers   Psychosocial barriers to participate in program There are no identifiable barriers or psychosocial needs.;The patient should benefit from training in stress management and relaxation.      Screening Interventions   Interventions Encouraged to exercise;To provide support and resources with identified psychosocial needs    Expected Outcomes Short Term goal: Utilizing psychosocial counselor, staff and physician to assist with identification of specific Stressors or current issues interfering with healing process. Setting desired goal for each stressor or current issue identified.;Long Term Goal: Stressors or current issues are controlled or eliminated.;Short Term goal: Identification and review with participant of any Quality of Life or Depression concerns found by scoring the questionnaire.;Long Term goal: The participant improves quality of Life and PHQ9 Scores as seen by post scores and/or verbalization of changes           Quality of Life Scores:   Quality of Life - 06/29/20 1421      Quality of Life   Select Quality of Life      Quality of Life Scores   Health/Function Pre 24.13 %    Socioeconomic Pre 25.18 %    Psych/Spiritual Pre 25.35 %    Family Pre 19.2 %    GLOBAL Pre 23.91 %          Scores of 19 and below usually indicate a poorer quality of life in these areas.  A difference of  2-3 points is a clinically meaningful difference.  A difference of 2-3 points in the total score of the Quality of Life Index has been associated with significant improvement in overall quality of life, self-image, physical symptoms, and general health in studies assessing change in quality of life.  PHQ-9: Recent Review Flowsheet Data    Depression screen [facility] 2/9 06/30/2020 04/11/2017   Decreased Interest 0 0   Down, Depressed, Hopeless 0 0   PHQ - 2 Score 0 0   Altered sleeping  0 -   Tired, decreased energy 0 -   Change in appetite 0 -   Feeling bad or failure about yourself  0 -   Trouble concentrating 0 -   Moving slowly or fidgety/restless 0 -   Suicidal thoughts 0 -   PHQ-9 Score 0 -   Difficult doing work/chores Not difficult at all -     Interpretation of Total Score  Total Score Depression Severity:  1-4 = Minimal depression, 5-9 = Mild depression, 10-14 = Moderate depression, 15-19 =  Moderately severe depression, 20-27 = Severe depression   Psychosocial Evaluation and Intervention:  Psychosocial Evaluation - 06/29/20 1418      Psychosocial Evaluation & Interventions   Interventions Encouraged to exercise with the program and follow exercise prescription    Comments Clayton Powell 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 His incision is healing well and he is slowly progressing his activity. He is ready to get back to golf and full time at his job, but is taking it day by day. He is very health conscious and stays on top of what his body is feeling. He reports sleeping well and minimal stress. The most recent stressor is his daughter getting COVID, but is doing well recovering. He hopes to continue progressing on his health journey through cardiac rehab.    Expected Outcomes Short: attend cardiac rehab for education and exercise. Long: develop and maintain positive self care habits.    Continue Psychosocial Services  Follow up required by staff           Psychosocial Re-Evaluation:  Psychosocial Re-Evaluation    [place] Name 08/12/20 1737             Psychosocial Re-Evaluation   Current issues with Current Stress Concerns       Comments Clayton Powell is holding up well mentally. He is back to work again full time and has caused some new stress overall. 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 His doctor is testing him for PAD so he is anxious to hear back if its that vs. sciatica pain. Sleep is good and has no issues. Does not  take any medications for depression or anxiety. Clayton Powell enjoys exercising after work as it is a good stress relief for him.       Expected Outcomes Short: Continue attendance with [facility] Long: Continue to use exercise for stress management/ maintain positive attitude       Interventions Encouraged to attend Cardiac Rehabilitation for the exercise       Continue Psychosocial Services  Follow up required by staff              Psychosocial Discharge (Final Psychosocial Re-Evaluation):  Psychosocial Re-Evaluation - 08/12/20 1737      Psychosocial Re-Evaluation   Current issues with Current Stress Concerns    Comments Clayton Powell is holding up well mentally. He is back to work again full time and has caused some new stress overall. 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 His doctor is testing him for PAD so he is anxious to hear back if its that vs. sciatica pain. Sleep is good and has no issues. Does not take any medications for depression or anxiety. Clayton Powell enjoys exercising after work as it is a good stress relief for him.    Expected Outcomes Short: Continue attendance with [facility] Long: Continue to use exercise for stress management/ maintain positive attitude    Interventions Encouraged to attend Cardiac Rehabilitation for the exercise    Continue Psychosocial Services  Follow up required by staff           Vocational Rehabilitation: Provide vocational rehab assistance to qualifying candidates.   Vocational Rehab Evaluation & Intervention:  Vocational Rehab - 06/29/20 1401      Initial Vocational Rehab Evaluation & Intervention   Assessment shows need for Vocational Rehabilitation No           Education: Education Goals: Education  classes will be provided on a variety of topics geared toward better understanding of heart health and risk factor modification. Participant will state understanding/return demonstration of topics presented as noted by  education test scores.  Learning Barriers/Preferences:  Learning Barriers/Preferences - 06/29/20 1407      Learning Barriers/Preferences   Learning Barriers None    Learning Preferences None           General Cardiac Education Topics:  AED/CPR: - Group verbal and written instruction with the use of models to demonstrate the basic use of the AED with the basic ABC's of resuscitation.   Anatomy and Cardiac Procedures: - Group verbal and visual presentation and models provide information about basic cardiac anatomy and function. Reviews the testing methods done to diagnose heart disease and the outcomes of the test results. Describes the treatment choices: Medical Management, Angioplasty, or Coronary Bypass Surgery for treating various heart conditions including Myocardial Infarction, Angina, Valve Disease, and Cardiac Arrhythmias.  Written material given at graduation. Flowsheet Row Cardiac Rehab from 08/12/2020 in [facility] Cardiac and Pulmonary Rehab  Date 07/15/20  Educator [facility]  Instruction Review Code 1- Verbalizes Understanding      Medication Safety: - Group verbal and visual instruction to review commonly prescribed medications for heart and lung disease. Reviews the medication, class of the drug, and side effects. Includes the steps to properly store meds and maintain the prescription regimen.  Written material given at graduation. Flowsheet Row Cardiac Rehab from 08/12/2020 in [facility] Cardiac and Pulmonary Rehab  Date 08/12/20  Educator [facility]  Instruction Review Code 1- Verbalizes Understanding      Intimacy: - Group verbal instruction through game format to discuss how heart and lung disease can affect sexual intimacy. Written material given at graduation.. Flowsheet Row Cardiac Rehab from 08/12/2020 in [facility] Cardiac and Pulmonary Rehab  Date 07/22/20  Educator [facility]  Instruction Review Code 1- Verbalizes Understanding      Know Your Numbers and Heart Failure: - Group verbal and  visual instruction to discuss disease risk factors for cardiac and pulmonary disease and treatment options.  Reviews associated critical values for Overweight/Obesity, Hypertension, Cholesterol, and Diabetes.  Discusses basics of heart failure: signs/symptoms and treatments.  Introduces Heart Failure Zone chart for action plan for heart failure.  Written material given at graduation.   Infection Prevention: - Provides verbal and written material to individual with discussion of infection control including proper hand washing and proper equipment cleaning during exercise session. Flowsheet Row Cardiac Rehab from 08/12/2020 in [facility] Cardiac and Pulmonary Rehab  Date 06/30/20  Educator [facility]  Instruction Review Code 1- Verbalizes Understanding      Falls Prevention: - Provides verbal and written material to individual with discussion of falls prevention and safety. Flowsheet Row Cardiac Rehab from 08/12/2020 in [facility] Cardiac and Pulmonary Rehab  Date 06/30/20  Educator [facility]  Instruction Review Code 1- Verbalizes Understanding      Other: -Provides group and verbal instruction on various topics (see comments)   Knowledge Questionnaire Score:  Knowledge Questionnaire Score - 06/29/20 1421      Knowledge Questionnaire Score   Pre Score 25/26- Exercise           Core Components/Risk Factors/Patient Goals at Admission:  Personal Goals and Risk Factors at Admission - 06/30/20 1450      Core Components/Risk Factors/Patient Goals on Admission    Weight Management Yes;Weight Loss    Intervention Weight Management: Develop a combined nutrition and exercise program designed to reach desired  caloric intake, while maintaining appropriate intake of nutrient and fiber, sodium and fats, and appropriate energy expenditure required for the weight goal.;Weight Management: Provide education and appropriate resources to help participant work on and attain dietary goals.    Admit Weight 189 lb 12.8 oz (86.1  kg)    Goal Weight: Short Term 185 lb (83.9 kg)    Goal Weight: Long Term 180 lb (81.6 kg)    Expected Outcomes Short Term: Continue to assess and modify interventions until short term weight is achieved;Long Term: Adherence to nutrition and physical activity/exercise program aimed toward attainment of established weight goal;Weight Loss: Understanding of general recommendations for a balanced deficit meal plan, which promotes 1-2 lb weight loss per week and includes a negative energy balance of [phone] kcal/d;Understanding recommendations for meals to include 15-35% energy as protein, 25-35% energy from fat, 35-60% energy from carbohydrates, less than 286m of dietary cholesterol, 20-35 gm of total fiber daily;Understanding of distribution of calorie intake throughout the day with the consumption of 4-5 meals/snacks    Hypertension Yes    Intervention Provide education on lifestyle modifcations including regular physical activity/exercise, weight management, moderate sodium restriction and increased consumption of fresh fruit, vegetables, and low fat dairy, alcohol moderation, and smoking cessation.;Monitor prescription use compliance.    Expected Outcomes Short Term: Continued assessment and intervention until BP is < 140/923mHG in hypertensive participants. < 130/8022mG in hypertensive participants with diabetes, heart failure or chronic kidney disease.;Long Term: Maintenance of blood pressure at goal levels.    Lipids Yes    Intervention Provide education and support for participant on nutrition & aerobic/resistive exercise along with prescribed medications to achieve LDL <7m11mDL >40mg87m Expected Outcomes Short Term: Participant states understanding of desired cholesterol values and is compliant with medications prescribed. Participant is following exercise prescription and nutrition guidelines.;Long Term: Cholesterol controlled with medications as prescribed, with individualized exercise RX and  with personalized nutrition plan. Value goals: LDL < 7mg,62m > 40 mg.           Education:Diabetes - Individual verbal and written instruction to review signs/symptoms of diabetes, desired ranges of glucose level fasting, after meals and with exercise. Acknowledge that pre and post exercise glucose checks will be done for 3 sessions at entry of program.   Core Components/Risk Factors/Patient Goals Review:   Goals and Risk Factor Review    Row Name 08/12/20 1724             Core Components/Risk Factors/Patient Goals Review   Personal Goals Review Weight Management/Obesity;Hypertension;Lipids       Review Randy Clayton Casaing well. He is at 187 lbs right now, he ideally would like to lose 10 more lbs. He lost 15 pounds after his surgery in October.  He diligently tracks his food to make sure he's eating correct amount of calories and servings of food groups. His wife helps him stay on track. He has excluded sodium, and keeps it under 1500 mg. He has been trying to move more at home. He does keep of a log of his weight at home. BP at rehab have been great, ranging 120 sy511lic and [id number]. .BeforMarland Kitchen surgery, he was in the 140s s735Alic so he pleased that is has been consistently in the 120s, and at home too.       Expected Outcomes Short: Continue working on weight loss Long: Continue to manage lifestyle risk factors  Core Components/Risk Factors/Patient Goals at Discharge (Final Review):   Goals and Risk Factor Review - 08/12/20 1724      Core Components/Risk Factors/Patient Goals Review   Personal Goals Review Weight Management/Obesity;Hypertension;Lipids    Review Clayton Powell is doing well. He is at 187 lbs right now, he ideally would like to lose 10 more lbs. He lost 15 pounds after his surgery in October.  He diligently tracks his food to make sure he's eating correct amount of calories and servings of food groups. His wife helps him stay on track. He has excluded sodium,  and keeps it under 1500 mg. He has been trying to move more at home. He does keep of a log of his weight at home. BP at rehab have been great, ranging 695 systolic and 07-22V diastolic. Marland KitchenBefore surgery, he was in the 750N systolic so he pleased that is has been consistently in the 120s, and at home too.    Expected Outcomes Short: Continue working on weight loss Long: Continue to manage lifestyle risk factors           ITP Comments:  ITP Comments    Row Name 06/29/20 1424 06/30/20 1445 07/01/20 1115 07/22/20 0740 08/19/20 1007   ITP Comments Initial telephone orientation completed. Diagnosis can be found in [facility] 11/3. EP orientation scheduled for Tuesday 12/7 at 10am. Completed 6MWT and gym orientation. Initial ITP created and sent for review to Dr. Emily Filbert, Medical Director. First full day of exercise!  Patient was oriented to gym and equipment including functions, settings, policies, and procedures.  Patient's individual exercise prescription and treatment plan were reviewed.  All starting workloads were established based on the results of the 6 minute walk test done at initial orientation visit.  The plan for exercise progression was also introduced and progression will be customized based on patient's performance and goals. 30 Day review completed. Medical Director ITP review done, changes made as directed, and signed approval by Medical Director. 30 Day review completed. Medical Director ITP review done, changes made as directed, and signed approval by Medical Director.          Comments:

## 2020-08-19 NOTE — Telephone Encounter (Signed)
Attempted to schedule.  LMOV to call office.  ° °

## 2020-08-20 ENCOUNTER — Encounter: Payer: 59 | Admitting: *Deleted

## 2020-08-20 ENCOUNTER — Other Ambulatory Visit: Payer: Self-pay

## 2020-08-20 DIAGNOSIS — Z951 Presence of aortocoronary bypass graft: Secondary | ICD-10-CM | POA: Diagnosis not present

## 2020-08-20 NOTE — Progress Notes (Signed)
Daily Session Note  Patient Details  Name: Clayton Powell MRN: [id number] Date of Birth: 1945-09-27 Referring Provider:   Flowsheet Row Cardiac Rehab from 06/30/2020 in [facility] Cardiac and Pulmonary Rehab  Referring Provider Serafina Royals MD      Encounter Date: 08/20/2020  Check In:  Session Check In - 08/20/20 1712      Check-In   Supervising physician immediately available to respond to emergencies See telemetry face sheet for immediately available ER MD    Location [facility]-Cardiac & Pulmonary Rehab    Staff Present Renita Papa, RN BSN;Joseph [address] [place], [place], ACSM CEP, Exercise Physiologist    Virtual Visit No    Medication changes reported     No    Fall or balance concerns reported    No    Warm-up and Cool-down Performed on first and last piece of equipment    Resistance Training Performed Yes    VAD Patient? No    PAD/SET Patient? No      Pain Assessment   Currently in Pain? No/denies              Social History   Tobacco Use  Smoking Status Never Smoker  Smokeless Tobacco Never Used    Goals Met:  Independence with exercise equipment Exercise tolerated well No report of cardiac concerns or symptoms Strength training completed today  Goals Unmet:  Not Applicable  Comments: Pt able to follow exercise prescription today without complaint.  Will continue to monitor for progression.    Dr. Emily Filbert is Medical Director for [place] and [facility].

## 2020-08-24 ENCOUNTER — Other Ambulatory Visit: Payer: Self-pay

## 2020-08-24 DIAGNOSIS — Z951 Presence of aortocoronary bypass graft: Secondary | ICD-10-CM

## 2020-08-24 NOTE — Progress Notes (Signed)
Daily Session Note  Patient Details  Name: Clayton Powell MRN: [id number] Date of Birth: Sep 01, 1945 Referring Provider:   Flowsheet Row Cardiac Rehab from 06/30/2020 in [facility] [place] [facility] Cardiac and Pulmonary Rehab  Referring Provider Serafina Royals MD      Encounter Date: 08/24/2020  Check In:  Session Check In - 08/24/20 1711      Check-In   Supervising physician immediately available to respond to emergencies See telemetry face sheet for immediately available ER MD    Location [facility]-Cardiac & Pulmonary Rehab    Staff Present Birdie Sons, MPA, Mauricia Area, BS, ACSM CEP, Exercise Physiologist;Kara Eliezer Bottom, MS Exercise Physiologist    Virtual Visit No    Medication changes reported     No    Fall or balance concerns reported    No    Warm-up and Cool-down Performed on first and last piece of equipment    Resistance Training Performed Yes    VAD Patient? No    PAD/SET Patient? No      Pain Assessment   Currently in Pain? No/denies              Social History   Tobacco Use  Smoking Status Never Smoker  Smokeless Tobacco Never Used    Goals Met:  Independence with exercise equipment Exercise tolerated well No report of cardiac concerns or symptoms Strength training completed today  Goals Unmet:  Not Applicable  Comments: Pt able to follow exercise prescription today without complaint.  Will continue to monitor for progression.    Dr. Emily Filbert is Medical Director for [place] and [facility].

## 2020-08-26 ENCOUNTER — Other Ambulatory Visit: Payer: Self-pay

## 2020-08-26 ENCOUNTER — Encounter: Payer: 59 | Attending: Internal Medicine

## 2020-08-26 DIAGNOSIS — I2510 Atherosclerotic heart disease of native coronary artery without angina pectoris: Secondary | ICD-10-CM | POA: Diagnosis present

## 2020-08-26 DIAGNOSIS — Z951 Presence of aortocoronary bypass graft: Secondary | ICD-10-CM | POA: Diagnosis not present

## 2020-08-26 NOTE — Progress Notes (Signed)
Daily Session Note  Patient Details  Name: Clayton Powell MRN: [id number] Date of Birth: Dec 12, 1945 Referring Provider:   Flowsheet Row Cardiac Rehab from 06/30/2020 in [facility] Cardiac and Pulmonary Rehab  Referring Provider Serafina Royals MD      Encounter Date: 08/26/2020  Check In:  Session Check In - 08/26/20 1626      Check-In   Supervising physician immediately available to respond to emergencies See telemetry face sheet for immediately available ER MD    Location [facility]-Cardiac & Pulmonary Rehab    Staff Present Birdie Sons, MPA, RN;Joseph Lou [place], [place] Exercise Physiologist    Virtual Visit No    Medication changes reported     No    Fall or balance concerns reported    No    Warm-up and Cool-down Performed on first and last piece of equipment    Resistance Training Performed Yes    VAD Patient? No    PAD/SET Patient? No      Pain Assessment   Currently in Pain? No/denies              Social History   Tobacco Use  Smoking Status Never Smoker  Smokeless Tobacco Never Used    Goals Met:  Independence with exercise equipment Exercise tolerated well No report of cardiac concerns or symptoms Strength training completed today  Goals Unmet:  Not Applicable  Comments: Pt able to follow exercise prescription today without complaint.  Will continue to monitor for progression.    Dr. Emily Filbert is Medical Director for [place] and [facility].

## 2020-08-27 ENCOUNTER — Encounter: Payer: 59 | Admitting: *Deleted

## 2020-08-27 DIAGNOSIS — I2510 Atherosclerotic heart disease of native coronary artery without angina pectoris: Secondary | ICD-10-CM | POA: Diagnosis not present

## 2020-08-27 DIAGNOSIS — Z951 Presence of aortocoronary bypass graft: Secondary | ICD-10-CM

## 2020-08-27 NOTE — Progress Notes (Signed)
Daily Session Note  Patient Details  Name: Clayton Powell MRN: [id number] Date of Birth: 04/05/1946 Referring Provider:   Flowsheet Row Cardiac Rehab from 06/30/2020 in [facility] Cardiac and Pulmonary Rehab  Referring Provider Serafina Royals MD      Encounter Date: 08/27/2020  Check In:  Session Check In - 08/27/20 1717      Check-In   Supervising physician immediately available to respond to emergencies See telemetry face sheet for immediately available ER MD    Location [facility]-Cardiac & Pulmonary Rehab    Staff Present Renita Papa, RN Moises Blood, BS, ACSM CEP, Exercise Physiologist;Amanda Oletta Darter, [place], ACSM CEP, Exercise Physiologist    Virtual Visit No    Medication changes reported     No    Fall or balance concerns reported    No    Warm-up and Cool-down Performed on first and last piece of equipment    Resistance Training Performed Yes    VAD Patient? No    PAD/SET Patient? No      Pain Assessment   Currently in Pain? No/denies              Social History   Tobacco Use  Smoking Status Never Smoker  Smokeless Tobacco Never Used    Goals Met:  Independence with exercise equipment Exercise tolerated well No report of cardiac concerns or symptoms Strength training completed today  Goals Unmet:  Not Applicable  Comments: Pt able to follow exercise prescription today without complaint.  Will continue to monitor for progression.    Dr. Emily Filbert is Medical Director for [place] and [facility].

## 2020-08-31 ENCOUNTER — Other Ambulatory Visit: Payer: Self-pay

## 2020-08-31 ENCOUNTER — Ambulatory Visit (INDEPENDENT_AMBULATORY_CARE_PROVIDER_SITE_OTHER): Payer: 59

## 2020-08-31 VITALS — Ht 67.6 in | Wt 195.1 lb

## 2020-08-31 DIAGNOSIS — Z951 Presence of aortocoronary bypass graft: Secondary | ICD-10-CM

## 2020-08-31 DIAGNOSIS — I2510 Atherosclerotic heart disease of native coronary artery without angina pectoris: Secondary | ICD-10-CM | POA: Diagnosis not present

## 2020-08-31 DIAGNOSIS — R0989 Other specified symptoms and signs involving the circulatory and respiratory systems: Secondary | ICD-10-CM

## 2020-08-31 NOTE — Progress Notes (Signed)
Daily Session Note  Patient Details  Name: Clayton Powell MRN: [id number] Date of Birth: 1946/01/05 Referring Provider:   Flowsheet Row Cardiac Rehab from 06/30/2020 in [facility] Cardiac and Pulmonary Rehab  Referring Provider Serafina Royals MD      Encounter Date: 08/31/2020  Check In:  Session Check In - 08/31/20 1711      Check-In   Supervising physician immediately available to respond to emergencies See telemetry face sheet for immediately available ER MD    Location [facility]-Cardiac & Pulmonary Rehab    Staff Present Birdie Sons, MPA, Mauricia Area, BS, ACSM CEP, Exercise Physiologist;Kara Eliezer Bottom, MS Exercise Physiologist    Virtual Visit No    Medication changes reported     No    Fall or balance concerns reported    No    Warm-up and Cool-down Performed on first and last piece of equipment    Resistance Training Performed Yes    VAD Patient? No    PAD/SET Patient? No      Pain Assessment   Currently in Pain? No/denies              Social History   Tobacco Use  Smoking Status Never Smoker  Smokeless Tobacco Never Used    Goals Met:  Independence with exercise equipment Exercise tolerated well Personal goals reviewed No report of cardiac concerns or symptoms Strength training completed today  Goals Unmet:  Not Applicable  Comments: Pt able to follow exercise prescription today without complaint.  Will continue to monitor for progression.   [place] Name 06/30/20 1445 08/31/20 1736       6 Minute Walk   Phase Initial -    Distance 1510 feet 1900 feet    Distance % Change - 26 %    Distance Feet Change - 390 ft    Walk Time 6 minutes 6 minutes    # of Rest Breaks 0 0    MPH 2.86 3.6    METS 3.24 4.02    RPE 13 13    Perceived Dyspnea  0 -    VO2 Peak 11.33 14.08    Symptoms Yes (comment) Yes (comment)  normal leg cramping (2/10 pain)    Comments bone spur/calf bothersome 6/10 -    Resting HR 70 bpm 70 bpm    Resting BP 134/72  148/80    Resting Oxygen Saturation  98 % -    Exercise Oxygen Saturation  during 6 min walk 98 % -    Max Ex. HR 119 bpm 109 bpm    Max Ex. BP 142/64 174/80    2 Minute Post BP 122/66 -              Dr. Emily Filbert is Medical Director for [place] and [facility].

## 2020-09-02 ENCOUNTER — Other Ambulatory Visit: Payer: Self-pay

## 2020-09-02 DIAGNOSIS — Z951 Presence of aortocoronary bypass graft: Secondary | ICD-10-CM

## 2020-09-02 DIAGNOSIS — I2510 Atherosclerotic heart disease of native coronary artery without angina pectoris: Secondary | ICD-10-CM | POA: Diagnosis not present

## 2020-09-02 NOTE — Progress Notes (Signed)
Daily Session Note  Patient Details  Name: Clayton Powell MRN: [id number] Date of Birth: Apr 22, 1946 Referring Provider:   Flowsheet Row Cardiac Rehab from 06/30/2020 in [facility] Cardiac and Pulmonary Rehab  Referring Provider Serafina Royals MD      Encounter Date: 09/02/2020  Check In:  Session Check In - 09/02/20 1734      Check-In   Supervising physician immediately available to respond to emergencies See telemetry face sheet for immediately available ER MD    Location [facility]-Cardiac & Pulmonary Rehab    Staff Present Birdie Sons, MPA, Nino Glow, MS Exercise Physiologist;Melissa Caiola RDN, LDN    Virtual Visit No    Medication changes reported     No    Fall or balance concerns reported    No    Warm-up and Cool-down Performed on first and last piece of equipment    Resistance Training Performed Yes    VAD Patient? No    PAD/SET Patient? No      Pain Assessment   Currently in Pain? No/denies              Social History   Tobacco Use  Smoking Status Never Smoker  Smokeless Tobacco Never Used    Goals Met:  Independence with exercise equipment Exercise tolerated well No report of cardiac concerns or symptoms Strength training completed today  Goals Unmet:  Not Applicable  Comments: Pt able to follow exercise prescription today without complaint.  Will continue to monitor for progression.    Dr. Emily Filbert is Medical Director for [place] and [facility].

## 2020-09-03 ENCOUNTER — Other Ambulatory Visit: Payer: Self-pay | Admitting: Family Medicine

## 2020-09-03 ENCOUNTER — Encounter: Payer: 59 | Admitting: *Deleted

## 2020-09-03 DIAGNOSIS — Z951 Presence of aortocoronary bypass graft: Secondary | ICD-10-CM

## 2020-09-03 DIAGNOSIS — I739 Peripheral vascular disease, unspecified: Secondary | ICD-10-CM

## 2020-09-03 DIAGNOSIS — I2510 Atherosclerotic heart disease of native coronary artery without angina pectoris: Secondary | ICD-10-CM | POA: Diagnosis not present

## 2020-09-03 NOTE — Progress Notes (Signed)
Daily Session Note  Patient Details  Name: SHAQUILL ISEMAN MRN: [id number] Date of Birth: 1946/03/06 Referring Provider:   Flowsheet Row Cardiac Rehab from 06/30/2020 in [facility] Cardiac and Pulmonary Rehab  Referring Provider Serafina Royals MD      Encounter Date: 09/03/2020  Check In:  Session Check In - 09/03/20 1717      Check-In   Supervising physician immediately available to respond to emergencies See telemetry face sheet for immediately available ER MD    Location [facility]-Cardiac & Pulmonary Rehab    Staff Present Renita Papa, RN BSN;Joseph [address] [place], [place], ACSM CEP, Exercise Physiologist    Virtual Visit No    Medication changes reported     No    Fall or balance concerns reported    No    Warm-up and Cool-down Performed on first and last piece of equipment    Resistance Training Performed Yes    VAD Patient? No    PAD/SET Patient? No      Pain Assessment   Currently in Pain? No/denies              Social History   Tobacco Use  Smoking Status Never Smoker  Smokeless Tobacco Never Used    Goals Met:  Independence with exercise equipment Exercise tolerated well No report of cardiac concerns or symptoms Strength training completed today  Goals Unmet:  Not Applicable  Comments: Pt able to follow exercise prescription today without complaint.  Will continue to monitor for progression.    Dr. Emily Filbert is Medical Director for [place] and [facility].

## 2020-09-07 ENCOUNTER — Other Ambulatory Visit: Payer: Self-pay

## 2020-09-07 DIAGNOSIS — Z951 Presence of aortocoronary bypass graft: Secondary | ICD-10-CM

## 2020-09-07 DIAGNOSIS — I2510 Atherosclerotic heart disease of native coronary artery without angina pectoris: Secondary | ICD-10-CM | POA: Diagnosis not present

## 2020-09-07 NOTE — Progress Notes (Signed)
Daily Session Note  Patient Details  Name: CHARLTON BOULE MRN: [id number] Date of Birth: 1945/10/20 Referring Provider:   Flowsheet Row Cardiac Rehab from 06/30/2020 in [facility] Cardiac and Pulmonary Rehab  Referring Provider Serafina Royals MD      Encounter Date: 09/07/2020  Check In:  Session Check In - 09/07/20 1711      Check-In   Supervising physician immediately available to respond to emergencies See telemetry face sheet for immediately available ER MD    Location [facility]-Cardiac & Pulmonary Rehab    Staff Present Birdie Sons, MPA, Mauricia Area, BS, ACSM CEP, Exercise Physiologist;Kara Eliezer Bottom, MS Exercise Physiologist    Virtual Visit No    Medication changes reported     No    Fall or balance concerns reported    No    Warm-up and Cool-down Performed on first and last piece of equipment    Resistance Training Performed Yes    VAD Patient? No    PAD/SET Patient? No      Pain Assessment   Currently in Pain? No/denies              Social History   Tobacco Use  Smoking Status Never Smoker  Smokeless Tobacco Never Used    Goals Met:  Independence with exercise equipment Exercise tolerated well No report of cardiac concerns or symptoms Strength training completed today  Goals Unmet:  Not Applicable  Comments: Pt able to follow exercise prescription today without complaint.  Will continue to monitor for progression.    Dr. Emily Filbert is Medical Director for [place] and [facility].

## 2020-09-09 ENCOUNTER — Other Ambulatory Visit: Payer: Self-pay

## 2020-09-09 DIAGNOSIS — I2510 Atherosclerotic heart disease of native coronary artery without angina pectoris: Secondary | ICD-10-CM | POA: Diagnosis not present

## 2020-09-09 DIAGNOSIS — Z951 Presence of aortocoronary bypass graft: Secondary | ICD-10-CM

## 2020-09-09 NOTE — Progress Notes (Signed)
Daily Session Note  Patient Details  Name: Clayton Powell MRN: [id number] Date of Birth: [birth year] Referring Provider:   Flowsheet Row Cardiac Rehab from 06/30/2020 in [facility] Cardiac and Pulmonary Rehab  Referring Provider Serafina Royals MD      Encounter Date: 09/09/2020  Check In:  Session Check In - 09/09/20 1713      Check-In   Supervising physician immediately available to respond to emergencies See telemetry face sheet for immediately available ER MD    Location [facility]-Cardiac & Pulmonary Rehab    Staff Present Birdie Sons, MPA, RN;Joseph Lou [place], [place] Exercise Physiologist    Virtual Visit No    Medication changes reported     No    Fall or balance concerns reported    No    Warm-up and Cool-down Performed on first and last piece of equipment    Resistance Training Performed Yes    VAD Patient? No    PAD/SET Patient? No      Pain Assessment   Currently in Pain? No/denies              Social History   Tobacco Use  Smoking Status Never Smoker  Smokeless Tobacco Never Used    Goals Met:  Independence with exercise equipment Exercise tolerated well No report of cardiac concerns or symptoms Strength training completed today  Goals Unmet:  Not Applicable  Comments: Pt able to follow exercise prescription today without complaint.  Will continue to monitor for progression.    Dr. Emily Filbert is Medical Director for [place] and [facility].

## 2020-09-10 ENCOUNTER — Encounter: Payer: 59 | Admitting: *Deleted

## 2020-09-10 DIAGNOSIS — Z951 Presence of aortocoronary bypass graft: Secondary | ICD-10-CM

## 2020-09-10 DIAGNOSIS — I2510 Atherosclerotic heart disease of native coronary artery without angina pectoris: Secondary | ICD-10-CM | POA: Diagnosis not present

## 2020-09-10 NOTE — Progress Notes (Signed)
Discharge Progress Report  Patient Details  Name: Clayton Powell MRN: [id number] Date of Birth: May 18, 1946 Referring Provider:   Flowsheet Row Cardiac Rehab from 06/30/2020 in [facility] Cardiac and [facility] Rehab  Referring Provider Serafina Royals MD       Number of Visits: 36  Reason for Discharge:  Patient reached a stable level of exercise. Patient independent in their exercise. Patient has met program and personal goals.  Smoking History:  Social History   Tobacco Use  Smoking Status Never Smoker  Smokeless Tobacco Never Used    Diagnosis:  S/P CABG x 3  ADL UCSD:   Initial Exercise Prescription:  Initial Exercise Prescription - 06/30/20 1400      Date of Initial Exercise RX and Referring Provider   Date 06/30/20    Referring Provider Serafina Royals MD      Treadmill   MPH 2.7    Grade 0.5    Minutes 15    METs 3.25      Elliptical   Level 1    Speed 2.9    Minutes 15    METs 3.2      T5 Nustep   Level 3    SPM 80    Minutes 15    METs 3.2      Prescription Details   Frequency (times per week) 3    Duration Progress to 30 minutes of continuous aerobic without signs/symptoms of physical distress      Intensity   THRR 40-80% of Max Heartrate 100-131    Ratings of Perceived Exertion 11-13    Perceived Dyspnea 0-4      Progression   Progression Continue to progress workloads to maintain intensity without signs/symptoms of physical distress.      Resistance Training   Training Prescription Yes    Weight 4 lb    Reps 10-15           Discharge Exercise Prescription (Final Exercise Prescription Changes):  Exercise Prescription Changes - 09/09/20 1500      Response to Exercise   Blood Pressure (Admit) 138/80    Blood Pressure (Exercise) 148/80    Blood Pressure (Exit) 118/64    Heart Rate (Admit) 70 bpm    Heart Rate (Exercise) 107 bpm    Heart Rate (Exit) 72 bpm    Rating of Perceived Exertion (Exercise) 12    Symptoms none     Duration Continue with 30 min of aerobic exercise without signs/symptoms of physical distress.    Intensity THRR unchanged      Progression   Progression Continue to progress workloads to maintain intensity without signs/symptoms of physical distress.    Average METs 5.4      Resistance Training   Training Prescription Yes    Weight 6 lb    Reps 10-15      Interval Training   Interval Training Yes    Equipment Treadmill   3-6% incline     Treadmill   MPH 3.2    Grade 8    Minutes 15    METs 7      Elliptical   Level 2    Speed 3.5    Minutes 15    METs 3.8      Home Exercise Plan   Plans to continue exercise at Home (comment)   Tread climber, walking   Frequency Add 2 additional days to program exercise sessions.    Initial Home Exercises Provided 08/06/20  Functional Capacity:  6 Minute Walk    Row Name 06/30/20 1445 08/31/20 1736       6 Minute Walk   Phase Initial -    Distance 1510 feet 1900 feet    Distance % Change - 26 %    Distance Feet Change - 390 ft    Walk Time 6 minutes 6 minutes    # of Rest Breaks 0 0    MPH 2.86 3.6    METS 3.24 4.02    RPE 13 13    Perceived Dyspnea  0 -    VO2 Peak 11.33 14.08    Symptoms Yes (comment) Yes (comment)  normal leg cramping (2/10 pain)    Comments bone spur/calf bothersome 6/10 -    Resting HR 70 bpm 70 bpm    Resting BP 134/72 148/80    Resting Oxygen Saturation  98 % -    Exercise Oxygen Saturation  during 6 min walk 98 % -    Max Ex. HR 119 bpm 109 bpm    Max Ex. BP 142/64 174/80    2 Minute Post BP 122/66 -           Psychological, QOL, Others - Outcomes: PHQ 2/9: Depression screen [facility] 2/9 09/07/2020 06/30/2020 04/11/2017  Decreased Interest 0 0 0  Down, Depressed, Hopeless 0 0 0  PHQ - 2 Score 0 0 0  Altered sleeping 0 0 -  Tired, decreased energy 1 0 -  Change in appetite 1 0 -  Feeling bad or failure about yourself  0 0 -  Trouble concentrating 0 0 -  Moving slowly or  fidgety/restless 0 0 -  Suicidal thoughts 0 0 -  PHQ-9 Score 2 0 -  Difficult doing work/chores Not difficult at all Not difficult at all -    Quality of Life:  Quality of Life - 09/07/20 1724      Quality of Life Scores   Health/Function Pre 24.13 %    Health/Function Post 27.07 %    Health/Function % Change 12.18 %    Socioeconomic Pre 25.18 %    Socioeconomic Post 27.75 %    Socioeconomic % Change  10.21 %    Psych/Spiritual Pre 25.35 %    Psych/Spiritual Post 27.86 %    Psych/Spiritual % Change 9.9 %    Family Pre 19.2 %    Family Post 26.4 %    Family % Change 37.5 %    GLOBAL Pre 23.91 %    GLOBAL Post 27.29 %    GLOBAL % Change 14.14 %           Nutrition & Weight - Outcomes:  Pre Biometrics - 06/30/20 1449      Pre Biometrics   Height 5' 7.6" (1.717 m)    Weight 189 lb 12.8 oz (86.1 kg)    BMI (Calculated) 29.2    Single Leg Stand 15.8 seconds           Post Biometrics - 08/31/20 1740       Post  Biometrics   Height 5' 7.6" (1.717 m)    Weight 195 lb 1.6 oz (88.5 kg)    BMI (Calculated) 30.02    Single Leg Stand 11.53 seconds           Nutrition:  Nutrition Therapy & Goals - 07/27/20 1632      Nutrition Therapy   Diet Heart healthy, low Na    Protein (specify units) 70g  Fiber 30 grams    Whole Grain Foods 3 servings    Saturated Fats 12 max. grams    Fruits and Vegetables 8 servings/day    Sodium 1.5 grams      Personal Nutrition Goals   Nutrition Goal ST: modify nighttime snack to be heart healthy LT: honor hunger, increase variety, maintain healthy eating habits    Comments multi-vitamin, vitamin D, B12, Ca +. B: oatmeal with blueberries. S: Shake with whey protein - with fruit. he eats lots of nonstarchy vegetables, little to no bread. Louie Casa is diligent about tracking his food and micro/macronutrients. Discussed nutritional needs and heart healthy eating, he was concerned about potassium and iron being low in his diet. Discussed  intuitive eating and honoring hunger. He does not like to eat fat like peanut butter due to calories. He is concerned about his snacking at night - suggested "nice" cream, kale chips, popcorn, greek yogurt and fruit.      Intervention Plan   Intervention Prescribe, educate and counsel regarding individualized specific dietary modifications aiming towards targeted core components such as weight, hypertension, lipid management, diabetes, heart failure and other comorbidities.;Nutrition handout(s) given to patient.    Expected Outcomes Short Term Goal: A plan has been developed with personal nutrition goals set during dietitian appointment.;Long Term Goal: Adherence to prescribed nutrition plan.;Short Term Goal: Understand basic principles of dietary content, such as calories, fat, sodium, cholesterol and nutrients.           Nutrition Discharge:   Education Questionnaire Score:  Knowledge Questionnaire Score - 09/07/20 1727      Knowledge Questionnaire Score   Pre Score 25/26- Exercise    Post Score 26/26           Goals reviewed with patient; copy given to patient.

## 2020-09-10 NOTE — Progress Notes (Signed)
Daily Session Note  Patient Details  Name: Clayton Powell MRN: [id number] Date of Birth: Mar 20, 1946 Referring Provider:   Flowsheet Row Cardiac Rehab from 06/30/2020 in [facility] Cardiac and Pulmonary Rehab  Referring Provider Serafina Royals MD      Encounter Date: 09/10/2020  Check In:  Session Check In - 09/10/20 1723      Check-In   Supervising physician immediately available to respond to emergencies See telemetry face sheet for immediately available ER MD    Location [facility]-Cardiac & Pulmonary Rehab    Staff Present Clayton Papa, RN BSN;Joseph [address] [place], [place], ACSM CEP, Exercise Physiologist    Virtual Visit No    Medication changes reported     No    Fall or balance concerns reported    No    Warm-up and Cool-down Performed on first and last piece of equipment    Resistance Training Performed Yes    VAD Patient? No    PAD/SET Patient? No      Pain Assessment   Currently in Pain? No/denies              Social History   Tobacco Use  Smoking Status Never Smoker  Smokeless Tobacco Never Used    Goals Met:  Independence with exercise equipment Exercise tolerated well No report of cardiac concerns or symptoms Strength training completed today  Goals Unmet:  Not Applicable  Comments:  [name] graduated today from  rehab with 36 sessions completed.  Details of the patient's exercise prescription and what He needs to do in order to continue the prescription and progress were discussed with patient.  Patient was given a copy of prescription and goals.  Patient verbalized understanding.  [name] plans to continue to exercise by walking and using tread climber at home.    Dr. Emily Filbert is Medical Director for [place] and [facility].

## 2020-09-10 NOTE — Progress Notes (Signed)
Cardiac Individual Treatment Plan  Patient Details  Name: Clayton Powell MRN: [id number] Date of Birth: [birth year] Referring Provider:   Flowsheet Row Cardiac Rehab from 06/30/2020 in [facility] Cardiac and Pulmonary Rehab  Referring Provider Clayton Royals MD      Initial Encounter Date:  Flowsheet Row Cardiac Rehab from 06/30/2020 in [facility] Cardiac and Pulmonary Rehab  Date 06/30/20      Visit Diagnosis: S/P CABG x 3  Patient's Home Medications on Admission:  Current Outpatient Medications:  .  alfuzosin (UROXATRAL) 10 MG 24 hr tablet, Take 1 tablet (10 mg total) by mouth daily with breakfast., Disp: 90 tablet, Rfl: 3 .  aspirin EC 81 MG tablet, Take 81 mg by mouth daily. Swallow whole., Disp: , Rfl:  .  Azelastine-Fluticasone 137-50 MCG/ACT SUSP, Place 1 spray into both nostrils 2 (two) times daily., Disp: , Rfl:  .  calcium citrate-vitamin D (CITRACAL+D) 315-200 MG-UNIT tablet, Take 1-2 tablets by mouth 2 (two) times daily., Disp: , Rfl:  .  Cholecalciferol (VITAMIN D) 2000 UNITS CAPS, Take 1 capsule by mouth daily., Disp: , Rfl:  .  Cyanocobalamin (VITAMIN B-12) 5000 MCG SUBL, Place 1 tablet under the tongue daily., Disp: , Rfl:  .  finasteride (PROSCAR) 5 MG tablet, Take 1 tablet (5 mg total) by mouth daily., Disp: 90 tablet, Rfl: 3 .  icosapent Ethyl (VASCEPA) 1 g capsule, Take 1 tablet by mouth 4 (four) times daily., Disp: , Rfl:  .  Multiple Vitamin (MULTIVITAMIN) tablet, Take 1 tablet by mouth daily., Disp: , Rfl:  .  rosuvastatin (CRESTOR) 40 MG tablet, Take 40 mg by mouth daily., Disp: , Rfl:   Past Medical History: Past Medical History:  Diagnosis Date  . Allergy   . Anginal pain ([place])   . BPH (benign prostatic hypertrophy) 6/07  . CAD (coronary artery disease) 2010   dx made by coronary calcium scoring, Normal stress test in 2010.   Marland Kitchen GERD (gastroesophageal reflux disease) 12/05  . HLD (hyperlipidemia) 3/99  . HTN (hypertension) 2002   no meds  . Left rotator cuff  tear 08/12/2011   Per Dr. Mardelle Matte 2013 L rotator cuff tear on MRI 2013   . Right rotator cuff tear 05/24/2013  . Wears glasses     Tobacco Use: Social History   Tobacco Use  Smoking Status Never Smoker  Smokeless Tobacco Never Used    Labs: Recent Review Flowsheet Data    Labs for ITP Cardiac and Pulmonary Rehab Latest Ref Rng & Units 05/29/2020 05/29/2020 05/29/2020 05/29/2020 05/29/2020   Cholestrol 0 - 200 mg/dL - - - - -   LDLCALC 0 - 99 mg/dL - - - - -   HDL >40 mg/dL - - - - -   Trlycerides <150 mg/dL - - - - -   Hemoglobin A1c 4.8 - 5.6 % - - - - -   PHART 7.350 - 7.450 7.342(L) - 7.305(L) 7.363 7.356   PCO2ART 32.0 - 48.0 mmHg 40.7 - 48.4(H) 38.8 35.6   HCO3 20.0 - 28.0 mmol/L 22.0 - 24.2 22.2 19.9(L)   TCO2 22 - 32 mmol/L 23 21(L) 26 23 21(L)   ACIDBASEDEF 0.0 - 2.0 mmol/L 3.0(H) - 3.0(H) 3.0(H) 5.0(H)   O2SAT % 94.0 - 98.0 96.0 97.0       Exercise Target Goals: Exercise Program Goal: Individual exercise prescription set using results from initial 6 min walk test and THRR while considering  patient's activity barriers and safety.   Exercise Prescription Goal:  Initial exercise prescription builds to 30-45 minutes a day of aerobic activity, 2-3 days per week.  Home exercise guidelines will be given to patient during program as part of exercise prescription that the participant will acknowledge.   Education: Aerobic Exercise: - Group verbal and visual presentation on the components of exercise prescription. Introduces F.I.T.T principle from ACSM for exercise prescriptions.  Reviews F.I.T.T. principles of aerobic exercise including progression. Written material given at graduation. Flowsheet Row Cardiac Rehab from 08/26/2020 in [facility] Cardiac and Pulmonary Rehab  Date 07/22/20  Educator AS  Instruction Review Code 1- Verbalizes Understanding      Education: Resistance Exercise: - Group verbal and visual presentation on the components of exercise prescription. Introduces  F.I.T.T principle from ACSM for exercise prescriptions  Reviews F.I.T.T. principles of resistance exercise including progression. Written material given at graduation. Flowsheet Row Cardiac Rehab from 08/26/2020 in [facility] Cardiac and Pulmonary Rehab  Date 07/15/20  Educator [facility]  Instruction Review Code 1- [employer] Understanding       Education: Exercise & Equipment Safety: - Individual verbal instruction and demonstration of equipment use and safety with use of the equipment. Flowsheet Row Cardiac Rehab from 08/26/2020 in [facility] Cardiac and Pulmonary Rehab  Date 06/30/20  Educator [facility]  Instruction Review Code 1- Verbalizes Understanding      Education: Exercise Physiology & General Exercise Guidelines: - Group verbal and written instruction with models to review the exercise physiology of the cardiovascular system and associated critical values. Provides general exercise guidelines with specific guidelines to those with heart or lung disease.  Flowsheet Row Cardiac Rehab from 08/26/2020 in [facility] Cardiac and Pulmonary Rehab  Education need identified 06/29/20  Date 07/08/20  Educator [facility]  Instruction Review Code 1- [employer] Understanding      Education: Flexibility, Balance, Mind/Body Relaxation: - Group verbal and visual presentation with interactive activity on the components of exercise prescription. Introduces F.I.T.T principle from ACSM for exercise prescriptions. Reviews F.I.T.T. principles of flexibility and balance exercise training including progression. Also discusses the mind body connection.  Reviews various relaxation techniques to help reduce and manage stress (i.e. Deep breathing, progressive muscle relaxation, and visualization). Balance handout provided to take home. Written material given at graduation. Flowsheet Row Cardiac Rehab from 08/26/2020 in [facility] Cardiac and Pulmonary Rehab  Date 07/29/20  Educator AS  Instruction Review Code 1- Verbalizes Understanding      Activity  Barriers & Risk Stratification:  Activity Barriers & Cardiac Risk Stratification - 06/30/20 1446      Activity Barriers & Cardiac Risk Stratification   Activity Barriers Incisional Pain;Deconditioning;Other (comment)    Comments previous bone spur surgery (cause pain on R leg from thigh to calf)    Cardiac Risk Stratification High           6 Minute Walk:  6 Minute Walk    Row Name 06/30/20 1445 08/31/20 1736       6 Minute Walk   Phase Initial --    Distance 1510 feet 1900 feet    Distance % Change -- 26 %    Distance Feet Change -- 390 ft    Walk Time 6 minutes 6 minutes    # of Rest Breaks 0 0    MPH 2.86 3.6    METS 3.24 4.02    RPE 13 13    Perceived Dyspnea  0 --    VO2 Peak 11.33 14.08    Symptoms Yes (comment) Yes (comment)  normal leg cramping (2/10 pain)  Comments bone spur/calf bothersome 6/10 --    Resting HR 70 bpm 70 bpm    Resting BP 134/72 148/80    Resting Oxygen Saturation  98 % --    Exercise Oxygen Saturation  during 6 min walk 98 % --    Max Ex. HR 119 bpm 109 bpm    Max Ex. BP 142/64 174/80    2 Minute Post BP 122/66 --           Oxygen Initial Assessment:   Oxygen Re-Evaluation:   Oxygen Discharge (Final Oxygen Re-Evaluation):   Initial Exercise Prescription:  Initial Exercise Prescription - 06/30/20 1400      Date of Initial Exercise RX and Referring Provider   Date 06/30/20    Referring Provider Clayton Royals MD      Treadmill   MPH 2.7    Grade 0.5    Minutes 15    METs 3.25      Elliptical   Level 1    Speed 2.9    Minutes 15    METs 3.2      T5 Nustep   Level 3    SPM 80    Minutes 15    METs 3.2      Prescription Details   Frequency (times per week) 3    Duration Progress to 30 minutes of continuous aerobic without signs/symptoms of physical distress      Intensity   THRR 40-80% of Max Heartrate 100-131    Ratings of Perceived Exertion 11-13    Perceived Dyspnea 0-4      Progression   Progression  Continue to progress workloads to maintain intensity without signs/symptoms of physical distress.      Resistance Training   Training Prescription Yes    Weight 4 lb    Reps 10-15           Perform Capillary Blood Glucose checks as needed.  Exercise Prescription Changes:  Exercise Prescription Changes    Row Name 06/30/20 1400 07/14/20 0700 07/27/20 1400 08/06/20 1700 08/10/20 1200     Response to Exercise   Blood Pressure (Admit) 134/72 100/60 104/60 -- 118/64   Blood Pressure (Exercise) 142/64 128/64 142/66 -- 142/62   Blood Pressure (Exit) 122/66 94/56 108/64 -- 120/62   Heart Rate (Admit) 70 bpm 73 bpm 65 bpm -- 81 bpm   Heart Rate (Exercise) 119 bpm 116 bpm 116 bpm -- 115 bpm   Heart Rate (Exit) 80 bpm 92 bpm 78 bpm -- 88 bpm   Oxygen Saturation (Admit) 98 % -- -- -- --   Oxygen Saturation (Exercise) 98 % -- -- -- --   Rating of Perceived Exertion (Exercise) _0 -- 12   Perceived Dyspnea (Exercise) 0 -- -- -- --   Symptoms bone spur/calf bothersome 6/10 none none -- none   Duration -- Continue with 30 min of aerobic exercise without signs/symptoms of physical distress. Continue with 30 min of aerobic exercise without signs/symptoms of physical distress. -- Continue with 30 min of aerobic exercise without signs/symptoms of physical distress.   Intensity -- THRR unchanged THRR unchanged -- THRR unchanged     Progression   Progression -- Continue to progress workloads to maintain intensity without signs/symptoms of physical distress. Continue to progress workloads to maintain intensity without signs/symptoms of physical distress. -- Continue to progress workloads to maintain intensity without signs/symptoms of physical distress.   Average METs -- 2.98 3.4 -- 4.17     Resistance Training  Training Prescription -- Yes Yes -- Yes   Weight -- 4 lb 6 lb -- 6 lb   Reps -- 10-15 10-15 -- 10-15     Interval Training   Interval Training -- No No -- No     Treadmill   MPH  -- 2.7 3 -- 3   Grade -- 0.5 2 -- 3   Minutes -- 15 15 -- 15   METs -- 3.25 4.12 -- 4.54     Elliptical   Level -- 1 1 -- 1   Speed -- 2.9 2.9 -- 3.5   Minutes -- 15 15 -- 15   METs -- 2.7 2.7 -- 3.8     T5 Nustep   Level -- 3 -- -- --   Minutes -- 15 -- -- --   METs -- 3 -- -- --     Home Exercise Plan   Plans to continue exercise at -- -- -- Home (comment)  Tread climber, walking Home (comment)  Tread climber, walking   Frequency -- -- -- Add 2 additional days to program exercise sessions. Add 2 additional days to program exercise sessions.   Initial Home Exercises Provided -- -- -- 08/06/20 08/06/20   Row Name 08/26/20 1500 09/09/20 1500           Response to Exercise   Blood Pressure (Admit) 130/76 138/80      Blood Pressure (Exercise) 150/80 148/80      Blood Pressure (Exit) 120/72 118/64      Heart Rate (Admit) 71 bpm 70 bpm      Heart Rate (Exercise) 103 bpm 107 bpm      Heart Rate (Exit) 73 bpm 72 bpm      Rating of Perceived Exertion (Exercise) 11 12      Symptoms none none      Duration Continue with 30 min of aerobic exercise without signs/symptoms of physical distress. Continue with 30 min of aerobic exercise without signs/symptoms of physical distress.      Intensity THRR unchanged THRR unchanged             Progression   Progression Continue to progress workloads to maintain intensity without signs/symptoms of physical distress. Continue to progress workloads to maintain intensity without signs/symptoms of physical distress.      Average METs 4 5.4             Resistance Training   Training Prescription Yes Yes      Weight 6 lb 6 lb      Reps 10-15 10-15             Interval Training   Interval Training Yes Yes      Equipment Treadmill  3-6% incline Treadmill  3-6% incline             Treadmill   MPH 3 3.2      Grade 3 8      Minutes 15 15      METs 4.54 7             Elliptical   Level 2 2      Speed 3.5 3.5      Minutes 15 15      METs  -- 3.8             Home Exercise Plan   Plans to continue exercise at Home (comment)  Tread climber, walking Home (comment)  Tread climber, walking      Frequency Add 2  additional days to program exercise sessions. Add 2 additional days to program exercise sessions.      Initial Home Exercises Provided 08/06/20 08/06/20             Exercise Comments:  Exercise Comments    Row Name 07/01/20 1116           Exercise Comments First full day of exercise!  Patient was oriented to gym and equipment including functions, settings, policies, and procedures.  Patient's individual exercise prescription and treatment plan were reviewed.  All starting workloads were established based on the results of the 6 minute walk test done at initial orientation visit.  The plan for exercise progression was also introduced and progression will be customized based on patient's performance and goals.              Exercise Goals and Review:  Exercise Goals    Row Name 06/30/20 1448             Exercise Goals   Increase Physical Activity Yes       Intervention Provide advice, education, support and counseling about physical activity/exercise needs.;Develop an individualized exercise prescription for aerobic and resistive training based on initial evaluation findings, risk stratification, comorbidities and participant's personal goals.       Expected Outcomes Short Term: Attend rehab on a regular basis to increase amount of physical activity.;Long Term: Add in home exercise to make exercise part of routine and to increase amount of physical activity.;Long Term: Exercising regularly at least 3-5 days a week.       Increase Strength and Stamina Yes       Intervention Provide advice, education, support and counseling about physical activity/exercise needs.;Develop an individualized exercise prescription for aerobic and resistive training based on initial evaluation findings, risk stratification, comorbidities  and participant's personal goals.       Expected Outcomes Short Term: Increase workloads from initial exercise prescription for resistance, speed, and METs.;Long Term: Improve cardiorespiratory fitness, muscular endurance and strength as measured by increased METs and functional capacity (6MWT);Short Term: Perform resistance training exercises routinely during rehab and add in resistance training at home       Able to understand and use rate of perceived exertion (RPE) scale Yes       Intervention Provide education and explanation on how to use RPE scale       Expected Outcomes Short Term: Able to use RPE daily in rehab to express subjective intensity level;Long Term:  Able to use RPE to guide intensity level when exercising independently       Able to understand and use Dyspnea scale Yes       Intervention Provide education and explanation on how to use Dyspnea scale       Expected Outcomes Short Term: Able to use Dyspnea scale daily in rehab to express subjective sense of shortness of breath during exertion;Long Term: Able to use Dyspnea scale to guide intensity level when exercising independently       Knowledge and understanding of Target Heart Rate Range (THRR) Yes       Intervention Provide education and explanation of THRR including how the numbers were predicted and where they are located for reference       Expected Outcomes Short Term: Able to state/look up THRR;Short Term: Able to use daily as guideline for intensity in rehab;Long Term: Able to use THRR to govern intensity when exercising independently       Able to check pulse independently  Yes       Intervention Provide education and demonstration on how to check pulse in carotid and radial arteries.;Review the importance of being able to check your own pulse for safety during independent exercise       Expected Outcomes Short Term: Able to explain why pulse checking is important during independent exercise;Long Term: Able to check pulse  independently and accurately       Understanding of Exercise Prescription Yes       Intervention Provide education, explanation, and written materials on patient's individual exercise prescription       Expected Outcomes Short Term: Able to explain program exercise prescription;Long Term: Able to explain home exercise prescription to exercise independently              Exercise Goals Re-Evaluation :  Exercise Goals Re-Evaluation    Row Name 07/01/20 1116 07/14/20 0743 07/27/20 1416 08/06/20 1742 08/10/20 1207     Exercise Goal Re-Evaluation   Exercise Goals Review Increase Physical Activity;Able to understand and use rate of perceived exertion (RPE) scale;Knowledge and understanding of Target Heart Rate Range (THRR);Understanding of Exercise Prescription;Increase Strength and Stamina;Able to understand and use Dyspnea scale;Able to check pulse independently Increase Physical Activity;Increase Strength and Stamina;Understanding of Exercise Prescription Increase Physical Activity;Increase Strength and Stamina Increase Physical Activity;Increase Strength and Stamina Increase Physical Activity;Increase Strength and Stamina;Understanding of Exercise Prescription   Comments Reviewed RPE and dyspnea scales, THR and program prescription with pt today.  Pt voiced understanding and was given a copy of goals to take home. Clayton Powell is off to a great start in rehab.  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  We will continue to monitor his progress. Clayton Powell steady progress and improved average METs.  He has moved up to 6 lb weights for strength work.  We will continue to monitor progress. Reviewed home exercise with pt today.  Pt plans to walk and use his treadclimber at home for exercise.  Reviewed THR, pulse, RPE, sign and symptoms, pulse oximetery and when to call 911 or MD.  Also discussed weather considerations and indoor options.  Pt voiced understanding.  Clayton Powell has been doing well in rehab.  He switched classes after going back to work.  He is really enjoying the elliptical and has requested to use it each day.  He is up to 3.8 METs on it already.  We will continue to monitor his progress.   Expected Outcomes Short: Use RPE daily to regulate intensity. Long: Follow program prescription in THR. Short: Review home exercise guidelines and improve workloads Long: Continue to follow program prescription Short : continue to progress slowly Long:  continue to improve MET level -- Short: Continue improve stamina on elliptical Long: Continue to improve stamina.   [place] Name 08/26/20 1513 08/31/20 1721 09/09/20 1520         Exercise Goal Re-Evaluation   Exercise Goals Review Increase Physical Activity;Increase Strength and Stamina Increase Physical Activity;Increase Strength and Stamina Increase Physical Activity;Increase Strength and Stamina;Understanding of Exercise Prescription     Comments Clayton Powell is doing intervals on TM.  He switches from 3-6% incline.  He is up to 6 lb for strength training. Clayton Powell imrproved greatly on his 6MWT, increasing from 1510 to about 1900 feet! Clayton Powell feels better since he started his exercise program- he is dissapointed that his leg his holding him back as his leg has been cramping- he is getting tested  for PAD and had the ultra sound today. He is very motivated to stay active, he is still doing the tread climber at home for about 20 minutes, talked about increasing the duration to help maintain minimum 30 minutes. He is planning on joing the [facility] after he graduates from the program. Clayton Powell will be graduating!! He is going to return to the [employer] after graduation!     Expected Outcomes Short:  continue to attned consistently Long: improve overall MET level Short: Long: Graduate from [place]: Join the [employer] to continue independent exercise Continue to exercise independently            Discharge Exercise Prescription (Final Exercise  Prescription Changes):  Exercise Prescription Changes - 09/09/20 1500      Response to Exercise   Blood Pressure (Admit) 138/80    Blood Pressure (Exercise) 148/80    Blood Pressure (Exit) 118/64    Heart Rate (Admit) 70 bpm    Heart Rate (Exercise) 107 bpm    Heart Rate (Exit) 72 bpm    Rating of Perceived Exertion (Exercise) 12    Symptoms none    Duration Continue with 30 min of aerobic exercise without signs/symptoms of physical distress.    Intensity THRR unchanged      Progression   Progression Continue to progress workloads to maintain intensity without signs/symptoms of physical distress.    Average METs 5.4      Resistance Training   Training Prescription Yes    Weight 6 lb    Reps 10-15      Interval Training   Interval Training Yes    Equipment Treadmill   3-6% incline     Treadmill   MPH 3.2    Grade 8    Minutes 15    METs 7      Elliptical   Level 2    Speed 3.5    Minutes 15    METs 3.8      Home Exercise Plan   Plans to continue exercise at Home (comment)   Tread climber, walking   Frequency Add 2 additional days to program exercise sessions.    Initial Home Exercises Provided 08/06/20           Nutrition:  Target Goals: Understanding of nutrition guidelines, daily intake of sodium <1527m, cholesterol <2029m calories 30% from fat and 7% or less from saturated fats, daily to have 5 or more servings of fruits and vegetables.  Education: All About Nutrition: -Group instruction provided by verbal, written material, interactive activities, discussions, models, and posters to present general guidelines for heart healthy nutrition including fat, fiber, MyPlate, the role of sodium in heart healthy nutrition, utilization of the nutrition label, and utilization of this knowledge for meal planning. Follow up email sent as well. Written material given at graduation. Flow[facility]  Date  08/05/20  [facility] 1- Verbalizes Understanding      Biometrics:  Pre Biometrics - 06/30/20 1449      Pre Biometrics   Height 5' 7.6" (1.717 m)    Weight 189 lb 12.8 oz (86.1 kg)    BMI (Calculated) 29.2    Single Leg Stand 15.8 seconds           Post Biometrics - 08/31/20 1740       Post  Biometrics   Height 5' 7.6" (1.717 m)    Weight 195 lb 1.6 oz (88.5  kg)    BMI (Calculated) 30.02    Single Leg Stand 11.53 seconds           Nutrition Therapy Plan and Nutrition Goals:  Nutrition Therapy & Goals - 07/27/20 1632      Nutrition Therapy   Diet Heart healthy, low Na    Protein (specify units) 70g    Fiber 30 grams    Whole Grain Foods 3 servings    Saturated Fats 12 max. grams    Fruits and Vegetables 8 servings/day    Sodium 1.5 grams      Personal Nutrition Goals   Nutrition Goal ST: modify nighttime snack to be heart healthy LT: honor hunger, increase variety, maintain healthy eating habits    Comments multi-vitamin, vitamin D, B12, Ca +. B: oatmeal with blueberries. S: Shake with whey protein - with fruit. he eats lots of nonstarchy vegetables, little to no bread. Clayton Powell is diligent about tracking his food and micro/macronutrients. Discussed nutritional needs and heart healthy eating, he was concerned about potassium and iron being low in his diet. Discussed intuitive eating and honoring hunger. He does not like to eat fat like peanut butter due to calories. He is concerned about his snacking at night - suggested "nice" cream, kale chips, popcorn, greek yogurt and fruit.      Intervention Plan   Intervention Prescribe, educate and counsel regarding individualized specific dietary modifications aiming towards targeted core components such as weight, hypertension, lipid management, diabetes, heart failure and other comorbidities.;Nutrition handout(s) given to patient.    Expected Outcomes Short Term Goal: A plan has been developed with  personal nutrition goals set during dietitian appointment.;Long Term Goal: Adherence to prescribed nutrition plan.;Short Term Goal: Understand basic principles of dietary content, such as calories, fat, sodium, cholesterol and nutrients.           Nutrition Assessments:  MEDIFICTS Score Key:  ?70 Need to make dietary changes   40-70 Heart Healthy Diet  ? 40 Therapeutic Level Cholesterol Diet  Flowsheet Row Cardiac Rehab from 09/09/2020 in [facility] Cardiac and Pulmonary Rehab  Picture Your Plate Total Score on Admission 70  Picture Your Plate Total Score on Discharge 77     Picture Your Plate Scores:  <62 Unhealthy dietary pattern with much room for improvement.  41-50 Dietary pattern unlikely to meet recommendations for good health and room for improvement.  51-60 More healthful dietary pattern, with some room for improvement.   >60 Healthy dietary pattern, although there may be some specific behaviors that could be improved.    Nutrition Goals Re-Evaluation:  Nutrition Goals Re-Evaluation    Clayton Powell Name 08/12/20 1729 08/31/20 1734 09/03/20 0731         Goals   Nutrition Goal ST: modify nighttime snack to be heart healthy LT: honor hunger, increase variety, maintain healthy eating habits ST: modify nighttime snack to be heart healthy LT: honor hunger, increase variety, maintain healthy eating habits ST:/LT: continue with changes made in program     Comment Clayton Powell is eating more fruits such as apples, oranges, apples, and pears. Night time snacking still a struggle for him- he is going to try to pay attention to the time at night when he is done eating. He keeps great detail and log of his food intake. He has decreased sodium under 1500 mg and increased fruit intake. Clayton Powell has been trying hard to stay away from saturated fats- eliminating skin on chicken, drinking fat-free or low-dairy products. Sodium intake is still staying  low. He is still increasing his portions of vegetables as his  wife and him are keeping logs of his diet at home. He will talk to the RD to further close the loops on some of his goals. Clayton Powell wanted to know his calorie needs - ([place] Je[employer]) BMR 1600kcal, 1.5 activity modifier for moderate exercise =2400kcal estimation. Encourgaed him to follow his hunger cues as these numbers are an estimation.     Expected Outcome Short: Continue manage diet at home Long: Maintain heart healthy diet Short: Speak with RD and graduate Long: Maintain heart healthy diet ST:/LT: continue with changes made in program            Nutrition Goals Discharge (Final Nutrition Goals Re-Evaluation):  Nutrition Goals Re-Evaluation - 09/03/20 0731      Goals   Nutrition Goal ST:/LT: continue with changes made in program    Comment Clayton Powell wanted to know his calorie needs - ([place] [employer] Jeor) BMR 1600kcal, 1.5 activity modifier for moderate exercise =2400kcal estimation. Encourgaed him to follow his hunger cues as these numbers are an estimation.    Expected Outcome ST:/LT: continue with changes made in program           Psychosocial: Target Goals: Acknowledge presence or absence of significant depression and/or stress, maximize coping skills, provide positive support system. Participant is able to verbalize types and ability to use techniques and skills needed for reducing stress and depression.   Education: Stress, Anxiety, and Depression - Group verbal and visual presentation to define topics covered.  Reviews how body is impacted by stress, anxiety, and depression.  Also discusses healthy ways to reduce stress and to treat/manage anxiety and depression.  Written material given at graduation. Flowsheet Row Cardiac Rehab from 08/26/2020 in [facility] Cardiac and Pul[facility] Rehab  Date 07/01/20  Educator [facility]  Instruction Review Code 1- [employer] Understanding      Education: Sleep Hygiene -Provides group verbal and written instruction about how sleep can affect your health.  Define  sleep hygiene, discuss sleep cycles and impact of sleep habits. Review good sleep hygiene tips.    Initial Review & Psychosocial Screening:  Initial Psych Review & Screening - 06/29/20 1408      Initial Review   Current issues with None Identified      Family Dynamics   Good Support System? Yes   wife     Barriers   Psychosocial barriers to participate in program There are no identifiable barriers or psychosocial needs.;The patient should benefit from training in stress management and relaxation.      Screening Interventions   Interventions Encouraged to exercise;To provide support and resources with identified psychosocial needs    Expected Outcomes Short Term goal: Utilizing psychosocial counselor, staff and physician to assist with identification of specific Stressors or current issues interfering with healing process. Setting desired goal for each stressor or current issue identified.;Long Term Goal: Stressors or current issues are controlled or eliminated.;Short Term goal: Identification and review with participant of any Quality of Life or Depression concerns found by scoring the questionnaire.;Long Term goal: The participant improves quality of Life and PHQ9 Scores as seen by post scores and/or verbalization of changes           Quality of Life Scores:   Quality of Life - 09/07/20 1724      Quality of Life Scores   Health/Function Pre 24.13 %    Health/Function Post 27.07 %    Health/Function % Change 12.18 %  Socioeconomic Pre 25.18 %    Socioeconomic Post 27.75 %    Socioeconomic % Change  10.21 %    Psych/Spiritual Pre 25.35 %    Psych/Spiritual Post 27.86 %    Psych/Spiritual % Change 9.9 %    Family Pre 19.2 %    Family Post 26.4 %    Family % Change 37.5 %    GLOBAL Pre 23.91 %    GLOBAL Post 27.29 %    GLOBAL % Change 14.14 %          Scores of 19 and below usually indicate a poorer quality of life in these areas.  A difference of  2-3 points is a  clinically meaningful difference.  A difference of 2-3 points in the total score of the Quality of Life Index has been associated with significant improvement in overall quality of life, self-image, physical symptoms, and general health in studies assessing change in quality of life.  PHQ-9: Recent Review Flowsheet Data    Depression screen [facility] 2/9 09/07/2020 06/30/2020 04/11/2017   Decreased Interest 0 0 0   Down, Depressed, Hopeless 0 0 0   PHQ - 2 Score 0 0 0   Altered sleeping 0 0 -   Tired, decreased energy 1 0 -   Change in appetite 1 0 -   Feeling bad or failure about yourself  0 0 -   Trouble concentrating 0 0 -   Moving slowly or fidgety/restless 0 0 -   Suicidal thoughts 0 0 -   PHQ-9 Score 2 0 -   Difficult doing work/chores Not difficult at all Not difficult at all -     Interpretation of Total Score  Total Score Depression Severity:  1-4 = Minimal depression, 5-9 = Mild depression, 10-14 = Moderate depression, 15-19 = Moderately severe depression, 20-27 = Severe depression   Psychosocial Evaluation and Intervention:  Psychosocial Evaluation - 06/29/20 1418      Psychosocial Evaluation & Interventions   Interventions Encouraged to exercise with the program and follow exercise prescription    Comments Clayton Powell 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 His incision is healing well and he is slowly progressing his activity. He is ready to get back to golf and full time at his job, but is taking it day by day. He is very health conscious and stays on top of what his body is feeling. He reports sleeping well and minimal stress. The most recent stressor is his daughter getting COVID, but is doing well recovering. He hopes to continue progressing on his health journey through cardiac rehab.    Expected Outcomes Short: attend cardiac rehab for education and exercise. Long: develop and maintain positive self care habits.    Continue Psychosocial Services  Follow up required by  staff           Psychosocial Re-Evaluation:  Psychosocial Re-Evaluation    [place] Name 08/12/20 1737 08/31/20 1736           Psychosocial Re-Evaluation   Current issues with Current Stress Concerns Current Stress Concerns      Comments Clayton Powell is holding up well mentally. He is back to work again full time and has caused some new stress overall. 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 His doctor is testing him for PAD so he is anxious to hear back if its that vs. sciatica pain. Sleep is good and has no issues. Does not take any  medications for depression or anxiety. Clayton Powell enjoys exercising after work as it is a good stress relief for him. Clayton Powell is still doing well. He got an ultrasound of his leg today and he is getting the results from his doctor tomorrow. He may look into pursuing PAD program if it is indeed PAD.Job is going well- he has a lot of pressure as he is high up in his position but enjoys coming to [facility] to relieve some of that stress. He is graduating soon . Feels "more in shape" than he did starting the program. His confidence has boosted tremendously for exercise.      Expected Outcomes Short: Continue attendance with [facility] Long: Continue to use exercise for stress management/ maintain positive attitude Short: Graduate from [place]: Continue to use exercise to manage stress management and maintain positive attitude      Interventions Encouraged to attend Cardiac Rehabilitation for the exercise Encouraged to attend Cardiac Rehabilitation for the exercise      Continue Psychosocial Services  Follow up required by staff Follow up required by staff             Psychosocial Discharge (Final Psychosocial Re-Evaluation):  Psychosocial Re-Evaluation - 08/31/20 1736      Psychosocial Re-Evaluation   Current issues with Current Stress Concerns    Comments Clayton Powell is still doing well. He got an ultrasound of his leg today and he is getting the  results from his doctor tomorrow. He may look into pursuing PAD program if it is indeed PAD.Job is going well- he has a lot of pressure as he is high up in his position but enjoys coming to [facility] to relieve some of that stress. He is graduating soon . Feels "more in shape" than he did starting the program. His confidence has boosted tremendously for exercise.    Expected Outcomes Short: Graduate from [place]: Continue to use exercise to manage stress management and maintain positive attitude    Interventions Encouraged to attend Cardiac Rehabilitation for the exercise    Continue Psychosocial Services  Follow up required by staff           Vocational Rehabilitation: Provide vocational rehab assistance to qualifying candidates.   Vocational Rehab Evaluation & Intervention:  Vocational Rehab - 06/29/20 1401      Initial Vocational Rehab Evaluation & Intervention   Assessment shows need for Vocational Rehabilitation No           Education: Education Goals: Education classes will be provided on a variety of topics geared toward better understanding of heart health and risk factor modification. Participant will state understanding/return demonstration of topics presented as noted by education test scores.  Learning Barriers/Preferences:  Learning Barriers/Preferences - 06/29/20 1407      Learning Barriers/Preferences   Learning Barriers None    Learning Preferences None           General Cardiac Education Topics:  AED/CPR: - Group verbal and written instruction with the use of models to demonstrate the basic use of the AED with the basic ABC's of resuscitation.   Anatomy and Cardiac Procedures: - Group verbal and visual presentation and models provide information about basic cardiac anatomy and function. Reviews the testing methods done to diagnose heart disease and the outcomes of the test results. Describes the treatment choices: Medical Management, Angioplasty,  or Coronary Bypass Surgery for treating various heart conditions including Myocardial Infarction, Angina, Valve Disease, and Cardiac Arrhythmias.  Written material given at graduation. Flowsheet Row Cardiac  Rehab from 08/26/2020 in [facility] Cardiac and Pulmonary Rehab  Date 07/15/20  Educator [facility]  Instruction Review Code 1- Verbalizes Understanding      Medication Safety: - Group verbal and visual instruction to review commonly prescribed medications for heart and lung disease. Reviews the medication, class of the drug, and side effects. Includes the steps to properly store meds and maintain the prescription regimen.  Written material given at graduation. Flowsheet Row Cardiac Rehab from 08/26/2020 in [facility] Cardiac and Pulmonary Rehab  Date 08/12/20  Educator [facility]  Instruction Review Code 1- Verbalizes Understanding      Intimacy: - Group verbal instruction through game format to discuss how heart and lung disease can affect sexual intimacy. Written material given at graduation.. Flowsheet Row Cardiac Rehab from 08/26/2020 in [facility] Cardiac and Pulmonary Rehab  Date 07/22/20  Educator [facility]  Instruction Review Code 1- Verbalizes Understanding      Know Your Numbers and Heart Failure: - Group verbal and visual instruction to discuss disease risk factors for cardiac and pulmonary disease and treatment options.  Reviews associated critical values for Overweight/Obesity, Hypertension, Cholesterol, and Diabetes.  Discusses basics of heart failure: signs/symptoms and treatments.  Introduces Heart Failure Zone chart for action plan for heart failure.  Written material given at graduation.   Infection Prevention: - Provides verbal and written material to individual with discussion of infection control including proper hand washing and proper equipment cleaning during exercise session. Flowsheet Row Cardiac Rehab from 08/26/2020 in [facility] Cardiac and Pulmonary Rehab  Date 06/30/20  Educator [facility]  Instruction Review  Code 1- Verbalizes Understanding      Falls Prevention: - Provides verbal and written material to individual with discussion of falls prevention and safety. Flowsheet Row Cardiac Rehab from 08/26/2020 in [facility] Cardiac and Pulmonary Rehab  Date 06/30/20  Educator [facility]  Instruction Review Code 1- Verbalizes Understanding      Other: -Provides group and verbal instruction on various topics (see comments)   Knowledge Questionnaire Score:  Knowledge Questionnaire Score - 09/07/20 1727      Knowledge Questionnaire Score   Pre Score 25/26- Exercise    Post Score 26/26           Core Components/Risk Factors/Patient Goals at Admission:  Personal Goals and Risk Factors at Admission - 06/30/20 1450      Core Components/Risk Factors/Patient Goals on Admission    Weight Management Yes;Weight Loss    Intervention Weight Management: Develop a combined nutrition and exercise program designed to reach desired caloric intake, while maintaining appropriate intake of nutrient and fiber, sodium and fats, and appropriate energy expenditure required for the weight goal.;Weight Management: Provide education and appropriate resources to help participant work on and attain dietary goals.    Admit Weight 189 lb 12.8 oz (86.1 kg)    Goal Weight: Short Term 185 lb (83.9 kg)    Goal Weight: Long Term 180 lb (81.6 kg)    Expected Outcomes Short Term: Continue to assess and modify interventions until short term weight is achieved;Long Term: Adherence to nutrition and physical activity/exercise program aimed toward attainment of established weight goal;Weight Loss: Understanding of general recommendations for a balanced deficit meal plan, which promotes 1-2 lb weight loss per week and includes a negative energy balance of [phone] kcal/d;Understanding recommendations for meals to include 15-35% energy as protein, 25-35% energy from fat, 35-60% energy from carbohydrates, less than 252m of dietary cholesterol, 20-35  gm of total fiber daily;Understanding of distribution of calorie intake throughout the  day with the consumption of 4-5 meals/snacks    Hypertension Yes    Intervention Provide education on lifestyle modifcations including regular physical activity/exercise, weight management, moderate sodium restriction and increased consumption of fresh fruit, vegetables, and low fat dairy, alcohol moderation, and smoking cessation.;Monitor prescription use compliance.    Expected Outcomes Short Term: Continued assessment and intervention until BP is < 140/69m HG in hypertensive participants. < 130/829mHG in hypertensive participants with diabetes, heart failure or chronic kidney disease.;Long Term: Maintenance of blood pressure at goal levels.    Lipids Yes    Intervention Provide education and support for participant on nutrition & aerobic/resistive exercise along with prescribed medications to achieve LDL <7011mHDL >1m39m  Expected Outcomes Short Term: Participant states understanding of desired cholesterol values and is compliant with medications prescribed. Participant is following exercise prescription and nutrition guidelines.;Long Term: Cholesterol controlled with medications as prescribed, with individualized exercise RX and with personalized nutrition plan. Value goals: LDL < 70mg66mL > 40 mg.           Education:Diabetes - Individual verbal and written instruction to review signs/symptoms of diabetes, desired ranges of glucose level fasting, after meals and with exercise. Acknowledge that pre and post exercise glucose checks will be done for 3 sessions at entry of program.   Core Components/Risk Factors/Patient Goals Review:   Goals and Risk Factor Review    Row Name 08/12/20 1724 08/31/20 1757           Core Components/Risk Factors/Patient Goals Review   Personal Goals Review Weight Management/Obesity;Hypertension;Lipids Weight Management/Obesity;Hypertension;Lipids      Review Clayton Powell well. He is at 187 lbs right now, he ideally would like to lose 10 more lbs. He lost 15 pounds after his surgery in October.  He diligently tracks his food to make sure he's eating correct amount of calories and servings of food groups. His wife helps him stay on track. He has excluded sodium, and keeps it under 1500 mg. He has been trying to move more at home. He does keep of a log of his weight at home. BP at rehab have been great, ranging 120 s382olic and [id number]. .[place] surgery, he was in the 140s 767Holic so he pleased that is has been consistently in the 120s, and at home too. Clayton Powell maintaining a low sodium diet. Blood pressures have been maintained at a stable level, ranging in the 120s 419Folic, sometimes he has higher systolic but only happens occassionaly. Stays compliant with the medications that he has. His 6MWT improved by almost 400 feet! Weight has been staying the same and he is going to talk to the RD about any changes in his diet going forward. He is graduating in the next couple of weeks.      Expected Outcomes Short: Continue working on weight loss Long: Continue to manage lifestyle risk factors Short: Graduate from [facility]: Manage lifestyle risk factors at home             Core Components/Risk Factors/Patient Goals at Discharge (Final Review):   Goals and Risk Factor Review - 08/31/20 1757      Core Components/Risk Factors/Patient Goals Review   Personal Goals Review Weight Management/Obesity;Hypertension;Lipids    Review Clayton Powell maintaining a low sodium diet. Blood pressures have been maintained at a stable level, ranging in the 120s 790Wolic, sometimes he has higher systolic but only happens occassionaly. Stays compliant with the medications that he has. His  6MWT improved by almost 400 feet! Weight has been staying the same and he is going to talk to the RD about any changes in his diet going forward. He is graduating in the next couple of  weeks.    Expected Outcomes Short: Graduate from [place]: Manage lifestyle risk factors at home           ITP Comments:  ITP Comments    Row Name 06/29/20 1424 06/30/20 1445 07/01/20 1115 07/22/20 0740 08/19/20 1007   ITP Comments Initial telephone orientation completed. Diagnosis can be found in [facility] 11/3. EP orientation scheduled for Tuesday 12/7 at 10am. Completed 6MWT and gym orientation. Initial ITP created and sent for review to Dr. Emily Filbert, Medical Director. First full day of exercise!  Patient was oriented to gym and equipment including functions, settings, policies, and procedures.  Patient's individual exercise prescription and treatment plan were reviewed.  All starting workloads were established based on the results of the 6 minute walk test done at initial orientation visit.  The plan for exercise progression was also introduced and progression will be customized based on patient's performance and goals. 30 Day review completed. Medical Director ITP review done, changes made as directed, and signed approval by Medical Director. 30 Day review completed. Medical Director ITP review done, changes made as directed, and signed approval by Medical Director.   [place] Name 09/10/20 1724           ITP Comments Clayton Powell graduated today from  rehab with 36 sessions completed.  Details of the patient's exercise prescription and what He needs to do in order to continue the prescription and progress were discussed with patient.  Patient was given a copy of prescription and goals.  Patient verbalized understanding.  Samar plans to continue to exercise by walking and using tread climber at home.              Comments: Discharge ITP

## 2020-09-10 NOTE — Patient Instructions (Signed)
Discharge Patient Instructions  Patient Details  Name: Clayton Powell MRN: [id number] Date of Birth: 1946-01-22 Referring Provider:  Corey Skains, MD   Number of Visits: 36  Reason for Discharge:  Patient reached a stable level of exercise. Patient independent in their exercise. Patient has met program and personal goals.  Smoking History:  Social History   Tobacco Use  Smoking Status Never Smoker  Smokeless Tobacco Never Used    Diagnosis:  S/P CABG x 3  Initial Exercise Prescription:  Initial Exercise Prescription - 06/30/20 1400      Date of Initial Exercise RX and Referring Provider   Date 06/30/20    Referring Provider Serafina Royals MD      Treadmill   MPH 2.7    Grade 0.5    Minutes 15    METs 3.25      Elliptical   Level 1    Speed 2.9    Minutes 15    METs 3.2      T5 Nustep   Level 3    SPM 80    Minutes 15    METs 3.2      Prescription Details   Frequency (times per week) 3    Duration Progress to 30 minutes of continuous aerobic without signs/symptoms of physical distress      Intensity   THRR 40-80% of Max Heartrate 100-131    Ratings of Perceived Exertion 11-13    Perceived Dyspnea 0-4      Progression   Progression Continue to progress workloads to maintain intensity without signs/symptoms of physical distress.      Resistance Training   Training Prescription Yes    Weight 4 lb    Reps 10-15           Discharge Exercise Prescription (Final Exercise Prescription Changes):  Exercise Prescription Changes - 09/09/20 1500      Response to Exercise   Blood Pressure (Admit) 138/80    Blood Pressure (Exercise) 148/80    Blood Pressure (Exit) 118/64    Heart Rate (Admit) 70 bpm    Heart Rate (Exercise) 107 bpm    Heart Rate (Exit) 72 bpm    Rating of Perceived Exertion (Exercise) 12    Symptoms none    Duration Continue with 30 min of aerobic exercise without signs/symptoms of physical distress.    Intensity THRR  unchanged      Progression   Progression Continue to progress workloads to maintain intensity without signs/symptoms of physical distress.    Average METs 5.4      Resistance Training   Training Prescription Yes    Weight 6 lb    Reps 10-15      Interval Training   Interval Training Yes    Equipment Treadmill   3-6% incline     Treadmill   MPH 3.2    Grade 8    Minutes 15    METs 7      Elliptical   Level 2    Speed 3.5    Minutes 15    METs 3.8      Home Exercise Plan   Plans to continue exercise at Home (comment)   Tread climber, walking   Frequency Add 2 additional days to program exercise sessions.    Initial Home Exercises Provided 08/06/20           Functional Capacity:  6 Minute Walk    Row Name 06/30/20 1445 08/31/20 1736  6 Minute Walk   Phase Initial --    Distance 1510 feet 1900 feet    Distance % Change -- 26 %    Distance Feet Change -- 390 ft    Walk Time 6 minutes 6 minutes    # of Rest Breaks 0 0    MPH 2.86 3.6    METS 3.24 4.02    RPE 13 13    Perceived Dyspnea  0 --    VO2 Peak 11.33 14.08    Symptoms Yes (comment) Yes (comment)  normal leg cramping (2/10 pain)    Comments bone spur/calf bothersome 6/10 --    Resting HR 70 bpm 70 bpm    Resting BP 134/72 148/80    Resting Oxygen Saturation  98 % --    Exercise Oxygen Saturation  during 6 min walk 98 % --    Max Ex. HR 119 bpm 109 bpm    Max Ex. BP 142/64 174/80    2 Minute Post BP 122/66 --            Nutrition & Weight - Outcomes:  Pre Biometrics - 06/30/20 1449      Pre Biometrics   Height 5' 7.6" (1.717 m)    Weight 189 lb 12.8 oz (86.1 kg)    BMI (Calculated) 29.2    Single Leg Stand 15.8 seconds           Post Biometrics - 08/31/20 1740       Post  Biometrics   Height 5' 7.6" (1.717 m)    Weight 195 lb 1.6 oz (88.5 kg)    BMI (Calculated) 30.02    Single Leg Stand 11.53 seconds           Nutrition:  Nutrition Therapy & Goals - 07/27/20 1632       Nutrition Therapy   Diet Heart healthy, low Na    Protein (specify units) 70g    Fiber 30 grams    Whole Grain Foods 3 servings    Saturated Fats 12 max. grams    Fruits and Vegetables 8 servings/day    Sodium 1.5 grams      Personal Nutrition Goals   Nutrition Goal ST: modify nighttime snack to be heart healthy LT: honor hunger, increase variety, maintain healthy eating habits    Comments multi-vitamin, vitamin D, B12, Ca +. B: oatmeal with blueberries. S: Shake with whey protein - with fruit. he eats lots of nonstarchy vegetables, little to no bread. Louie Casa is diligent about tracking his food and micro/macronutrients. Discussed nutritional needs and heart healthy eating, he was concerned about potassium and iron being low in his diet. Discussed intuitive eating and honoring hunger. He does not like to eat fat like peanut butter due to calories. He is concerned about his snacking at night - suggested "nice" cream, kale chips, popcorn, greek yogurt and fruit.      Intervention Plan   Intervention Prescribe, educate and counsel regarding individualized specific dietary modifications aiming towards targeted core components such as weight, hypertension, lipid management, diabetes, heart failure and other comorbidities.;Nutrition handout(s) given to patient.    Expected Outcomes Short Term Goal: A plan has been developed with personal nutrition goals set during dietitian appointment.;Long Term Goal: Adherence to prescribed nutrition plan.;Short Term Goal: Understand basic principles of dietary content, such as calories, fat, sodium, cholesterol and nutrients.          Goals reviewed with patient; copy given to patient.

## 2020-09-22 ENCOUNTER — Other Ambulatory Visit: Payer: Self-pay

## 2020-09-22 ENCOUNTER — Telehealth (INDEPENDENT_AMBULATORY_CARE_PROVIDER_SITE_OTHER): Payer: Self-pay

## 2020-09-22 ENCOUNTER — Encounter (INDEPENDENT_AMBULATORY_CARE_PROVIDER_SITE_OTHER): Payer: Self-pay | Admitting: Vascular Surgery

## 2020-09-22 ENCOUNTER — Ambulatory Visit (INDEPENDENT_AMBULATORY_CARE_PROVIDER_SITE_OTHER): Payer: Medicare Other | Admitting: Vascular Surgery

## 2020-09-22 ENCOUNTER — Ambulatory Visit: Payer: 59 | Admitting: Podiatry

## 2020-09-22 VITALS — BP 145/77 | HR 67 | Resp 16 | Ht 66.0 in | Wt 192.6 lb

## 2020-09-22 DIAGNOSIS — I70211 Atherosclerosis of native arteries of extremities with intermittent claudication, right leg: Secondary | ICD-10-CM | POA: Diagnosis not present

## 2020-09-22 DIAGNOSIS — M7751 Other enthesopathy of right foot: Secondary | ICD-10-CM | POA: Diagnosis not present

## 2020-09-22 DIAGNOSIS — E782 Mixed hyperlipidemia: Secondary | ICD-10-CM | POA: Diagnosis not present

## 2020-09-22 DIAGNOSIS — M205X1 Other deformities of toe(s) (acquired), right foot: Secondary | ICD-10-CM

## 2020-09-22 DIAGNOSIS — I10 Essential (primary) hypertension: Secondary | ICD-10-CM

## 2020-09-22 DIAGNOSIS — I70219 Atherosclerosis of native arteries of extremities with intermittent claudication, unspecified extremity: Secondary | ICD-10-CM | POA: Insufficient documentation

## 2020-09-22 DIAGNOSIS — I2510 Atherosclerotic heart disease of native coronary artery without angina pectoris: Secondary | ICD-10-CM

## 2020-09-22 MED ORDER — BETAMETHASONE SOD PHOS & ACET 6 (3-3) MG/ML IJ SUSP
3.0000 mg | Freq: Once | INTRAMUSCULAR | Status: AC
  Administered 2020-09-22: 3 mg via INTRA_ARTICULAR

## 2020-09-22 NOTE — Progress Notes (Signed)
   HPI: 75 y.o. male presenting today with a chief complaint of pain to the 1st MPJ of the right foot that began a few months ago.  Patient states that he is doing well over the last few months he has had some increased foot pain.  He recently underwent triple bypass open heart surgery and is visiting with Dr. Lucky Cowboy, vascular this morning as well.  Past Medical History:  Diagnosis Date  . Allergy   . Anginal pain ([place])   . BPH (benign prostatic hypertrophy) 6/07  . CAD (coronary artery disease) 2010   dx made by coronary calcium scoring, Normal stress test in 2010.   Marland Kitchen GERD (gastroesophageal reflux disease) 12/05  . HLD (hyperlipidemia) 3/99  . HTN (hypertension) 2002   no meds  . Left rotator cuff tear 08/12/2011   Per Dr. Mardelle Matte 2013 L rotator cuff tear on MRI 2013   . Right rotator cuff tear 05/24/2013  . Wears glasses      Physical Exam: General: The patient is alert and oriented x3 in no acute distress.  Dermatology: Skin is warm, dry and supple bilateral lower extremities. Negative for open lesions or macerations.  Vascular: Palpable pedal pulses bilaterally. No edema or erythema noted. Capillary refill within normal limits.  Neurological: Epicritic and protective threshold grossly intact bilaterally.   Musculoskeletal Exam: Pain with palpation noted to the 1st MPJ of the right foot. Range of motion within normal limits to all pedal and ankle joints bilateral. Muscle strength 5/5 in all groups bilateral.   Radiographic Exam:  Normal osseous mineralization. Joint spaces preserved. No fracture/dislocation/boney destruction.    Assessment: 1. S/p cheilectomy right DOS: 08/17/2017 2. 1st MPJ capsulitis right    Plan of Care:  1. Patient evaluated. X-Rays reviewed.  2. Injection of 0.5 mLs Celestone Soluspan injected into the 1st MPJ of the right foot.  3.  Continue custom orthotics as needed 4. Return to clinic as needed.   *Still working for [employer]      Edrick Kins, DPM [facility]  Dr. Edrick Kins, DPM    [address], [place] [address]                Office [phone]  Fax [phone]

## 2020-09-22 NOTE — Assessment & Plan Note (Signed)
blood pressure control important in reducing the progression of atherosclerotic disease. On appropriate oral medications.  

## 2020-09-22 NOTE — Assessment & Plan Note (Signed)
Doing well now status post coronary artery bypass grafting.

## 2020-09-22 NOTE — H&P (View-Only) (Signed)
Patient ID: Clayton Powell, male   DOB: July 21, 1946, 75 y.o.   MRN: [id number]  Chief Complaint  Patient presents with  . New Patient (Initial Visit)    Ref Damita Dunnings vascular consult    HPI Clayton Powell is a 75 y.o. male.  I am asked to see the patient by Dr. Damita Dunnings for evaluation of peripheral arterial disease with claudication.  The patient has had issues on the right side including sciatica and heel spurs in the foot, but over the past several months has noticed cramping in his right calf with walking.  He was doing cardiac rehab and walking on the treadmill caused pain in the right calf that was reproducible.  This was relieved with rest.  He does not have pain that wakes him at night.  He does not have ulceration or infection.  He has a previous history of coronary artery bypass grafting.  This prompted noninvasive studies done at another office which I have reviewed.  His right ABI 0.74 with a normal left ABI of 1.  Duplex shows elevated velocities in the right SFA consistent with a relatively high-grade stenosis.  The left SFA has mild to moderately elevated velocities more consistent with a moderate stenosis.  Given these findings, he is referred for further evaluation and treatment.     Past Medical History:  Diagnosis Date  . Allergy   . Anginal pain ([place])   . BPH (benign prostatic hypertrophy) 6/07  . CAD (coronary artery disease) 2010   dx made by coronary calcium scoring, Normal stress test in 2010.   Marland Kitchen GERD (gastroesophageal reflux disease) 12/05  . HLD (hyperlipidemia) 3/99  . HTN (hypertension) 2002   no meds  . Left rotator cuff tear 08/12/2011   Per Dr. Mardelle Matte 2013 L rotator cuff tear on MRI 2013   . Right rotator cuff tear 05/24/2013  . Wears glasses     Past Surgical History:  Procedure Laterality Date  . admitted to [facility] chest pain  12/15/05   Dr. Clayborn Bigness  . BLEPHAROPLASTY  10/11   Dr. Loletta Specter   . bone spur removed Right   . COLONOSCOPY  09/24/04   nml;  Dr. Epifanio Lesches   . CORONARY ARTERY BYPASS GRAFT N/A 05/29/2020   Procedure: CORONARY ARTERY BYPASS GRAFTING (CABG) TIMES THREE USING LIMA to LAD; ENDOSCOPIC HARVESTED RIGHT GREATER SAPHENOUS VEIN: SVG to PD; SVG to RAMUS.;  Surgeon: Grace Isaac, MD;  Location: [place];  Service: Open Heart Surgery;  Laterality: N/A;  . EGD esophagitis by bx  01/26/06   gastritis bx neg h-pylori  . ENDOVEIN HARVEST OF GREATER SAPHENOUS VEIN Right 05/29/2020   Procedure: ENDOVEIN HARVEST OF GREATER SAPHENOUS VEIN;  Surgeon: Grace Isaac, MD;  Location: [place];  Service: Open Heart Surgery;  Laterality: Right;  . LEFT HEART CATH AND CORONARY ANGIOGRAPHY N/A 05/26/2020   Procedure: LEFT HEART CATH AND CORONARY ANGIOGRAPHY;  Surgeon: Corey Skains, MD;  Location: [place] [facility];  Service: Cardiovascular;  Laterality: N/A;  . POLYPECTOMY    . right and left prostate needle bx  05/02/03   acute inflammation; Dr. Gaynelle Arabian   . SHOULDER ARTHROSCOPY W/ ROTATOR CUFF REPAIR  12/2011   left  . SHOULDER ARTHROSCOPY WITH ROTATOR CUFF REPAIR AND SUBACROMIAL DECOMPRESSION Right 05/24/2013   Procedure: RIGHT SHOULDER ARTHROSCOPY WITH ROTATOR CUFF REPAIR AND SUBACROMIAL DECOMPRESSION AND PARTIAL ACROMIOPLSTY WITH CORACOACROMIAL RELEASE, DISTAL CLAVICULECTOMY, LABRIAL DEBRIDEMENT;  Surgeon: Johnny Bridge, MD;  Location: [facility];  Service: Orthopedics;  Laterality: Right;  . spect ETT nml  12/16/05  . TEE WITHOUT CARDIOVERSION N/A 05/29/2020   Procedure: TRANSESOPHAGEAL ECHOCARDIOGRAM (TEE);  Surgeon: Grace Isaac, MD;  Location: [place];  Service: Open Heart Surgery;  Laterality: N/A;  . UPPER GASTROINTESTINAL ENDOSCOPY    . WISDOM TOOTH EXTRACTION       Family History  Problem Relation Age of Onset  . Stroke Mother   . Glaucoma Other        grandmother at 34  . Barrett's esophagus Maternal Uncle   . Prostate cancer Neg Hx   . Colon cancer Neg Hx   . Colon polyps Neg Hx   . Esophageal  cancer Neg Hx   . Rectal cancer Neg Hx   . Stomach cancer Neg Hx      Social History   Tobacco Use  . Smoking status: Never Smoker  . Smokeless tobacco: Never Used  Substance Use Topics  . Alcohol use: Yes    Alcohol/week: 0.0 standard drinks    Comment: beer, occ  . Drug use: No     Allergies  Allergen Reactions  . Ciprofloxacin Hcl Rash  . Ciprofloxacin Hcl Rash  . Quinolones Rash    Current Outpatient Medications  Medication Sig Dispense Refill  . alfuzosin (UROXATRAL) 10 MG 24 hr tablet Take 1 tablet (10 mg total) by mouth daily with breakfast. 90 tablet 3  . aspirin EC 81 MG tablet Take 81 mg by mouth daily. Swallow whole.    . Azelastine-Fluticasone 137-50 MCG/ACT SUSP Place 1 spray into both nostrils 2 (two) times daily.    . calcium citrate (CALCITRATE - DOSED IN MG ELEMENTAL CALCIUM) 950 (200 Ca) MG tablet Take 1 tablet by mouth 2 (two) times daily.    . calcium citrate-vitamin D (CITRACAL+D) 315-200 MG-UNIT tablet Take 1-2 tablets by mouth 2 (two) times daily.    . Cholecalciferol (VITAMIN D) 2000 UNITS CAPS Take 1 capsule by mouth daily.    . Cyanocobalamin (VITAMIN B-12) 5000 MCG SUBL Place 1 tablet under the tongue daily.    . fexofenadine (ALLEGRA) 180 MG tablet Take 1 tablet by mouth daily as needed.    . finasteride (PROSCAR) 5 MG tablet Take 1 tablet (5 mg total) by mouth daily. 90 tablet 3  . icosapent Ethyl (VASCEPA) 1 g capsule Take 1 tablet by mouth 4 (four) times daily.    . meloxicam (MOBIC) 15 MG tablet Take by mouth.    Marland Kitchen MILK THISTLE PO Take 1 capsule by mouth daily.    . montelukast (SINGULAIR) 10 MG tablet Take by mouth.    . Multiple Vitamin (MULTIVITAMIN) tablet Take 1 tablet by mouth daily.    . rosuvastatin (CRESTOR) 40 MG tablet Take 40 mg by mouth daily.    . tadalafil (CIALIS) 5 MG tablet Take by mouth.    . traMADol (ULTRAM) 50 MG tablet Take by mouth.    . Vitamin E 180 MG (400 UNIT) CAPS Take by mouth.    . MEDROL 4 MG TBPK tablet  Take      6 PILLS FIRST DAY, 5 PILLS SECOND DAY, 4 PILLS THIRD DAY, 3 PILLS FOURTH DAY, 2 PILLS FIFTH DAY, 1 PILL SIXTH DAY (Patient not taking: Reported on 09/22/2020)     No current facility-administered medications for this visit.      REVIEW OF SYSTEMS (Negative unless checked)  Constitutional: [] Weight loss  [] Fever  [] Chills Cardiac: [] Chest pain   []   Chest pressure   [] Palpitations   [] Shortness of breath when laying flat   [] Shortness of breath at rest   [] Shortness of breath with exertion. Vascular:  [x] Pain in legs with walking   [] Pain in legs at rest   [] Pain in legs when laying flat   [x] Claudication   [] Pain in feet when walking  [] Pain in feet at rest  [] Pain in feet when laying flat   [] History of DVT   [] Phlebitis   [] Swelling in legs   [] Varicose veins   [] Non-healing ulcers Pulmonary:   [] Uses home oxygen   [] Productive cough   [] Hemoptysis   [] Wheeze  [] COPD   [] Asthma Neurologic:  [] Dizziness  [] Blackouts   [] Seizures   [] History of stroke   [] History of TIA  [] Aphasia   [] Temporary blindness   [] Dysphagia   [] Weakness or numbness in arms   [] Weakness or numbness in legs Musculoskeletal:  [x] Arthritis   [] Joint swelling   [x] Joint pain   [] Low back pain Hematologic:  [] Easy bruising  [] Easy bleeding   [] Hypercoagulable state   [] Anemic  [] Hepatitis Gastrointestinal:  [] Blood in stool   [] Vomiting blood  [x] Gastroesophageal reflux/heartburn   [] Abdominal pain Genitourinary:  [] Chronic kidney disease   [] Difficult urination  [] Frequent urination  [] Burning with urination   [] Hematuria Skin:  [] Rashes   [] Ulcers   [] Wounds Psychological:  [] History of anxiety   []  History of major depression.    Physical Exam BP (!) 145/77 (BP Location: Right Arm)   Pulse 67   Resp 16   Ht 5\' 6"  (1.676 m)   Wt 192 lb 9.6 oz (87.4 kg)   BMI 31.09 kg/m  Gen:  WD/WN, NAD.  Appears younger than stated age Head: [place]/AT, No temporalis wasting.  Ear/Nose/Throat: Hearing grossly intact, nares  w/o erythema or drainage, oropharynx w/o Erythema/Exudate Eyes: Conjunctiva clear, sclera non-icteric  Neck: trachea midline.  No JVD.  Pulmonary:  Good air movement, respirations not labored, no use of accessory muscles  Cardiac: RRR, no JVD Vascular:  Vessel Right Left  Radial Palpable Palpable                          DP  1+  2+  PT  1+  2+   Gastrointestinal:. No masses, surgical incisions, or scars. Musculoskeletal: M/S 5/5 throughout.  Extremities without ischemic changes.  No deformity or atrophy.  No edema. Neurologic: Sensation grossly intact in extremities.  Symmetrical.  Speech is fluent. Motor exam as listed above. Psychiatric: Judgment intact, Mood & affect appropriate for pt's clinical situation. Dermatologic: No rashes or ulcers noted.  No cellulitis or open wounds.    Radiology VAS [place] LOWER EXTREMITY ARTERIAL DUPLEX  Result Date: 09/01/2020 LOWER EXTREMITY ARTERIAL DUPLEX STUDY Indications: Claudication, and peripheral artery disease. High Risk Factors: Hypertension, no history of smoking, coronary artery disease. Other Factors: Patient complains of right calf pain. He noticed the significance                of the pain since he started cardiac rehab. He denies any left                leg claudication symptosm.                CABG x 3.  Current ABI: 08/13/20-Right ABi 0.74; Left ABI 1.09 Performing Technologist: Wilkie Aye RVT  Examination Guidelines: A complete evaluation includes B-mode imaging, spectral Doppler, color Doppler, and power Doppler as needed of  all accessible portions of each vessel. Bilateral testing is considered an integral part of a complete examination. Limited examinations for reoccurring indications may be performed as noted. Aorta: +-----------+-------+----------+----------+            AP (cm)Trans (cm)PSV (cm/s) +-----------+-------+----------+----------+ Supraceliac1.80   1.70      104        +-----------+-------+----------+----------+  Proximal                    86         +-----------+-------+----------+----------+ Mid                         97         +-----------+-------+----------+----------+  +----------+--------+-----+---------------+---------------+------------------+ RIGHT     PSV cm/sRatioStenosis       Waveform       Comments           +----------+--------+-----+---------------+---------------+------------------+ CIA Prox  113                         triphasic                         +----------+--------+-----+---------------+---------------+------------------+ CIA Mid   155                         triphasic                         +----------+--------+-----+---------------+---------------+------------------+ CIA Distal129                         biphasic                          +----------+--------+-----+---------------+---------------+------------------+ EIA Prox  94                          biphasic                          +----------+--------+-----+---------------+---------------+------------------+ EIA Mid   127                         triphasic                         +----------+--------+-----+---------------+---------------+------------------+ EIA Distal130                         triphasic                         +----------+--------+-----+---------------+---------------+------------------+ CFA Prox  108                         biphasic                          +----------+--------+-----+---------------+---------------+------------------+ CFA Distal98                          biphasic                          +----------+--------+-----+---------------+---------------+------------------+ DFA  92                          biphasic                          +----------+--------+-----+---------------+---------------+------------------+ SFA Prox  77                          barely biphasic                    +----------+--------+-----+---------------+---------------+------------------+ SFA Mid   18                          monophasic                        +----------+--------+-----+---------------+---------------+------------------+ SFA Distal151     8.4  75-99% stenosis               Per velocity ratio +----------+--------+-----+---------------+---------------+------------------+ POP Prox  37                          monophasic                        +----------+--------+-----+---------------+---------------+------------------+ POP Distal34                          monophasic                        +----------+--------+-----+---------------+---------------+------------------+ TP Trunk  41                          monophasic                        +----------+--------+-----+---------------+---------------+------------------+ ATA Prox  36                          monophasic                        +----------+--------+-----+---------------+---------------+------------------+ PTA Mid   44                          biphasic                          +----------+--------+-----+---------------+---------------+------------------+ PERO Mid  23                          monophasic                        +----------+--------+-----+---------------+---------------+------------------+ A focal velocity elevation of 151 cm/s was obtained at Mid/Distal SFA with post stenotic turbulence with a VR of 8.4. Findings are characteristic of 75-99% stenosis.  +----------+--------+-----+---------------+---------+--------+ LEFT      PSV cm/sRatioStenosis       Waveform Comments +----------+--------+-----+---------------+---------+--------+ CIA Prox  97                          triphasic         +----------+--------+-----+---------------+---------+--------+ CIA Mid  90                          biphasic           +----------+--------+-----+---------------+---------+--------+ CIA Distal87                          biphasic          +----------+--------+-----+---------------+---------+--------+ EIA Prox  129                         triphasic         +----------+--------+-----+---------------+---------+--------+ EIA Mid   107                         triphasic         +----------+--------+-----+---------------+---------+--------+ EIA Distal111                         triphasic         +----------+--------+-----+---------------+---------+--------+ CFA Prox  104                         triphasic         +----------+--------+-----+---------------+---------+--------+ CFA Distal97                          triphasic         +----------+--------+-----+---------------+---------+--------+ DFA       47                          biphasic          +----------+--------+-----+---------------+---------+--------+ SFA Prox  88                          triphasic         +----------+--------+-----+---------------+---------+--------+ SFA Mid   49                          triphasic         +----------+--------+-----+---------------+---------+--------+ SFA Distal146     3.0  50-74% stenosistriphasic         +----------+--------+-----+---------------+---------+--------+ POP Prox  79                          triphasic         +----------+--------+-----+---------------+---------+--------+ POP Distal43                          biphasic          +----------+--------+-----+---------------+---------+--------+ TP Trunk  46                          biphasic          +----------+--------+-----+---------------+---------+--------+ ATA Prox  69                          biphasic          +----------+--------+-----+---------------+---------+--------+ PTA Mid   65                          triphasic          +----------+--------+-----+---------------+---------+--------+  PERO Mid  56                          triphasic         +----------+--------+-----+---------------+---------+--------+ A focal velocity elevation of 146 cm/s was obtained at Distal SFA with a VR of 3.0. Findings are characteristic of 50-74% stenosis.  Summary: Right: Atherosclerosis in the aorta, iliac, femoral, popliteal and tibial arteries. 75-99% stenosis in the mid/distal SFA Three vessel run off. Acoustic shadowing in the mid/distal SFA therefore, can not rule out high grade stenosis versus occlusion.  Left: Atherosclerosis in the aorta, iliac, femoral, popliteal and tibial arteries. 50-74% stenosis in the distal SFA per velocity ratio. Three vessel run off.  See table(s) above for measurements and observations. Vascular consult recommended. Electronically signed by Kathlyn Sacramento MD on 09/01/2020 at 1:34:11 PM.    Final     Labs Recent Results (from the past 2160 hour(s))  Bladder Scan (Post Void Residual) in office     Status: None   Collection Time: 08/05/20  1:40 PM  Result Value Ref Range   Scan Result 84mL     Assessment/Plan:  Atherosclerosis of native arteries of extremity with intermittent claudication (HCC) His right ABI 0.74 with a normal left ABI of 1.  Duplex shows elevated velocities in the right SFA consistent with a relatively high-grade stenosis.  The left SFA has mild to moderately elevated velocities more consistent with a moderate stenosis.  Recommend:  The patient has experienced increased symptoms and is now describing lifestyle limiting claudication and mild rest pain.   Given the severity of the patient's lower extremity symptoms the patient should undergo angiography and intervention.  Risk and benefits were reviewed the patient.  Indications for the procedure were reviewed.  All questions were answered, the patient agrees to proceed.   The patient should continue walking and begin a more formal  exercise program.  The patient should continue antiplatelet therapy and aggressive treatment of the lipid abnormalities  The patient will follow up with me after the angiogram.   HTN (hypertension) blood pressure control important in reducing the progression of atherosclerotic disease. On appropriate oral medications.   CAD (coronary artery disease) Doing well now status post coronary artery bypass grafting.  Hyperlipidemia, mixed lipid control important in reducing the progression of atherosclerotic disease.        Leotis Pain 09/22/2020, 10:32 AM   This note was created with Dragon medical transcription system.  Any errors from dictation are unintentional.

## 2020-09-22 NOTE — Assessment & Plan Note (Signed)
His right ABI 0.74 with a normal left ABI of 1.  Duplex shows elevated velocities in the right SFA consistent with a relatively high-grade stenosis.  The left SFA has mild to moderately elevated velocities more consistent with a moderate stenosis.  Recommend:  The patient has experienced increased symptoms and is now describing lifestyle limiting claudication and mild rest pain.   Given the severity of the patient's lower extremity symptoms the patient should undergo angiography and intervention.  Risk and benefits were reviewed the patient.  Indications for the procedure were reviewed.  All questions were answered, the patient agrees to proceed.   The patient should continue walking and begin a more formal exercise program.  The patient should continue antiplatelet therapy and aggressive treatment of the lipid abnormalities  The patient will follow up with me after the angiogram.

## 2020-09-22 NOTE — Patient Instructions (Signed)
Peripheral Vascular Disease  Peripheral vascular disease (PVD) is a disease of the blood vessels that carry blood from the heart to the rest of the body. PVD is also called peripheral artery disease (PAD) or poor circulation. PVD affects most of the body. But it affects the legs and feet the most. PVD can lead to acute limb ischemia. This happens when there is a sudden stop of blood flow to an arm or leg. This is a medical emergency. What are the causes? The most common cause of PVD is a buildup of a fatty substance (plaque) inside your arteries. This decreases blood flow. Plaque can break off and block blood in a smaller artery. This can lead to acute limb ischemia. Other common causes of PVD include:  Blood clots inside the blood vessels.  Injuries to blood vessels.  Irritation and swelling of blood vessels.  Sudden tightening of the blood vessel (spasms). What increases the risk?  A family history of PVD.  Medical conditions, including: ? High cholesterol. ? Diabetes. ? High blood pressure. ? Heart disease. ? Past problems with blood clots. ? Past injury, such as burns or a broken bone.  Other conditions, such as: ? Buerger's disease. This is caused by swollen or irritated blood vessels in your hands and feet. ? Arthritis. ? Birth defects that affect the arteries in your legs. ? Kidney disease.  Using tobacco or nicotine products.  Not getting enough exercise.  Being very overweight (obese).  Being 50 years old or older. What are the signs or symptoms?  Cramps in your butt, legs, and feet.  Pain and weakness in your legs when you are active that goes away when you rest.  Leg pain when at rest.  Leg numbness, tingling, or weakness.  Coldness in a leg or foot, especially when compared with the other leg or foot.  Skin or hair changes. These can include: ? Hair loss. ? Shiny skin. ? Pale or bluish skin. ? Thick toenails.  Being unable to get or keep an  erection.  Tiredness (fatigue).  Weak pulse or no pulse in the feet.  Wounds and sores on the toes, feet, or legs. These take longer to heal. How is this treated? Underlying causes are treated first. Other conditions, like diabetes, high cholesterol, and blood pressure, are also treated. Treatment may include:  Lifestyle changes, such as: ? Quitting smoking. ? Getting regular exercise. ? Having a diet low in fat and cholesterol. ? Not drinking alcohol.  Taking medicines, such as: ? Blood thinners. ? Medicines to improve blood flow. ? Medicines to improve your blood cholesterol.  Procedures to: ? Open the arteries and restore blood flow. ? Insert a small mesh tube (stent) to keep a blocked vessel open. ? Create a new path for blood to flow to the body (peripheral bypass). ? Remove dead tissue from a wound. ? Remove an affected leg or arm. Follow these instructions at home: Medicines  Take over-the-counter and prescription medicines only as told by your doctor.  If you are taking blood thinners: ? Talk with your doctor before you take any medicines that have aspirin, or NSAIDs, such as ibuprofen. ? Take medicines exactly as told. Take them at the same time each day. ? Avoid doing things that could hurt or bruise you. Take action to prevent falls. ? Wear an alert bracelet or carry a card that shows you are taking blood thinners. Lifestyle  Get regular exercise. Ask your doctor about how to stay active.    Talk with your doctor about keeping a healthy weight. If needed, ask about losing weight.  Eat a diet that is low in fat and cholesterol. If you need help, talk with your doctor.  Do not drink alcohol.  Do not smoke or use any products that contain nicotine or tobacco. If you need help quitting, ask your doctor.      General instructions  Take good care of your feet. To do this: ? Wear shoes that fit well and feel good. ? Check your feet often for any cuts or  sores.  Get a flu shot (influenza vaccine) each year.  Keep all follow-up visits. Where to find more information  Society for Vascular [employer]: vascular.org  American Heart Association: heart.org  National Heart, Lung, and Blood Institute: nhlbi.nih.gov Contact a doctor if:  You have cramps in your legs when you walk.  You have leg pain when you rest.  Your leg or foot feels cold.  Your skin changes.  You cannot get or keep an erection.  You have cuts or sores on your legs or feet that do not heal. Get help right away if:  You have sudden changes in the color and feeling of your arms or legs, such as: ? Your arm or leg turns cold, numb, and blue. ? Your arm or leg becomes red, warm, swollen, painful, or numb.  You have any signs of a stroke. "BE FAST" is an easy way to remember the main warning signs: ? B - Balance. Dizziness, sudden trouble walking, or loss of balance. ? E - Eyes. Trouble seeing or a change in how you see. ? F - Face. Sudden weakness or loss of feeling of the face. The face or eyelid may droop on one side. ? A - Arms. Weakness or loss of feeling in an arm. This happens all of a sudden and most often on one side of the body. ? S - Speech. Sudden trouble speaking, slurred speech, or trouble understanding what people say. ? T - Time. Time to call emergency services. Write down what time symptoms started.  You have other signs of a stroke, such as: ? A sudden, very bad headache with no known cause. ? Feeling like you may vomit (nausea). ? Vomiting. ? A seizure.  You have chest pain or trouble breathing. These symptoms may be an emergency. Get help right away. Call your local emergency services (911 in the [place]  Do not wait to see if the symptoms will go away.  Do not drive yourself to the hospital. Summary  Peripheral vascular disease (PVD) is a disease of the blood vessels.  PVD affects the legs and feet the most.  Symptoms may include leg  pain or leg numbness, tingling, and weakness.  Treatment may include lifestyle changes, medicines, and procedures. This information is not intended to replace advice given to you by your health care provider. Make sure you discuss any questions you have with your health care provider. Document Revised: 01/13/2020 Document Reviewed: 01/13/2020 [employer] Patient Education  2021 [employer]  

## 2020-09-22 NOTE — Telephone Encounter (Signed)
Patient was seen in office today and scheduled with Dr. Lucky Cowboy for a RLE angio on 10/01/20 with a 8:15 am arrival time to the [facility] Covid testing on 09/29/20 between 8-1 pm at the [facility]. Pre-procedure instructions were discussed and handed to the patient.

## 2020-09-22 NOTE — Progress Notes (Signed)
Patient ID: Clayton Powell, male   DOB: 08-Mar-1946, 75 y.o.   MRN: [id number]  Chief Complaint  Patient presents with  . New Patient (Initial Visit)    Ref Damita Dunnings vascular consult    HPI Clayton Powell is a 75 y.o. male.  I am asked to see the patient by Dr. Damita Dunnings for evaluation of peripheral arterial disease with claudication.  The patient has had issues on the right side including sciatica and heel spurs in the foot, but over the past several months has noticed cramping in his right calf with walking.  He was doing cardiac rehab and walking on the treadmill caused pain in the right calf that was reproducible.  This was relieved with rest.  He does not have pain that wakes him at night.  He does not have ulceration or infection.  He has a previous history of coronary artery bypass grafting.  This prompted noninvasive studies done at another office which I have reviewed.  His right ABI 0.74 with a normal left ABI of 1.  Duplex shows elevated velocities in the right SFA consistent with a relatively high-grade stenosis.  The left SFA has mild to moderately elevated velocities more consistent with a moderate stenosis.  Given these findings, he is referred for further evaluation and treatment.     Past Medical History:  Diagnosis Date  . Allergy   . Anginal pain ([place])   . BPH (benign prostatic hypertrophy) 6/07  . CAD (coronary artery disease) 2010   dx made by coronary calcium scoring, Normal stress test in 2010.   Marland Kitchen GERD (gastroesophageal reflux disease) 12/05  . HLD (hyperlipidemia) 3/99  . HTN (hypertension) 2002   no meds  . Left rotator cuff tear 08/12/2011   Per Dr. Mardelle Matte 2013 L rotator cuff tear on MRI 2013   . Right rotator cuff tear 05/24/2013  . Wears glasses     Past Surgical History:  Procedure Laterality Date  . admitted to [facility] chest pain  12/15/05   Dr. Clayborn Bigness  . BLEPHAROPLASTY  10/11   Dr. Loletta Specter   . bone spur removed Right   . COLONOSCOPY  09/24/04   nml;  Dr. Epifanio Lesches   . CORONARY ARTERY BYPASS GRAFT N/A 05/29/2020   Procedure: CORONARY ARTERY BYPASS GRAFTING (CABG) TIMES THREE USING LIMA to LAD; ENDOSCOPIC HARVESTED RIGHT GREATER SAPHENOUS VEIN: SVG to PD; SVG to RAMUS.;  Surgeon: Grace Isaac, MD;  Location: [place];  Service: Open Heart Surgery;  Laterality: N/A;  . EGD esophagitis by bx  01/26/06   gastritis bx neg h-pylori  . ENDOVEIN HARVEST OF GREATER SAPHENOUS VEIN Right 05/29/2020   Procedure: ENDOVEIN HARVEST OF GREATER SAPHENOUS VEIN;  Surgeon: Grace Isaac, MD;  Location: [place];  Service: Open Heart Surgery;  Laterality: Right;  . LEFT HEART CATH AND CORONARY ANGIOGRAPHY N/A 05/26/2020   Procedure: LEFT HEART CATH AND CORONARY ANGIOGRAPHY;  Surgeon: Corey Skains, MD;  Location: [place] [facility];  Service: Cardiovascular;  Laterality: N/A;  . POLYPECTOMY    . right and left prostate needle bx  05/02/03   acute inflammation; Dr. Gaynelle Arabian   . SHOULDER ARTHROSCOPY W/ ROTATOR CUFF REPAIR  12/2011   left  . SHOULDER ARTHROSCOPY WITH ROTATOR CUFF REPAIR AND SUBACROMIAL DECOMPRESSION Right 05/24/2013   Procedure: RIGHT SHOULDER ARTHROSCOPY WITH ROTATOR CUFF REPAIR AND SUBACROMIAL DECOMPRESSION AND PARTIAL ACROMIOPLSTY WITH CORACOACROMIAL RELEASE, DISTAL CLAVICULECTOMY, LABRIAL DEBRIDEMENT;  Surgeon: Johnny Bridge, MD;  Location: [facility];  Service: Orthopedics;  Laterality: Right;  . spect ETT nml  12/16/05  . TEE WITHOUT CARDIOVERSION N/A 05/29/2020   Procedure: TRANSESOPHAGEAL ECHOCARDIOGRAM (TEE);  Surgeon: Grace Isaac, MD;  Location: [place];  Service: Open Heart Surgery;  Laterality: N/A;  . UPPER GASTROINTESTINAL ENDOSCOPY    . WISDOM TOOTH EXTRACTION       Family History  Problem Relation Age of Onset  . Stroke Mother   . Glaucoma Other        grandmother at 38  . Barrett's esophagus Maternal Uncle   . Prostate cancer Neg Hx   . Colon cancer Neg Hx   . Colon polyps Neg Hx   . Esophageal  cancer Neg Hx   . Rectal cancer Neg Hx   . Stomach cancer Neg Hx      Social History   Tobacco Use  . Smoking status: Never Smoker  . Smokeless tobacco: Never Used  Substance Use Topics  . Alcohol use: Yes    Alcohol/week: 0.0 standard drinks    Comment: beer, occ  . Drug use: No     Allergies  Allergen Reactions  . Ciprofloxacin Hcl Rash  . Ciprofloxacin Hcl Rash  . Quinolones Rash    Current Outpatient Medications  Medication Sig Dispense Refill  . alfuzosin (UROXATRAL) 10 MG 24 hr tablet Take 1 tablet (10 mg total) by mouth daily with breakfast. 90 tablet 3  . aspirin EC 81 MG tablet Take 81 mg by mouth daily. Swallow whole.    . Azelastine-Fluticasone 137-50 MCG/ACT SUSP Place 1 spray into both nostrils 2 (two) times daily.    . calcium citrate (CALCITRATE - DOSED IN MG ELEMENTAL CALCIUM) 950 (200 Ca) MG tablet Take 1 tablet by mouth 2 (two) times daily.    . calcium citrate-vitamin D (CITRACAL+D) 315-200 MG-UNIT tablet Take 1-2 tablets by mouth 2 (two) times daily.    . Cholecalciferol (VITAMIN D) 2000 UNITS CAPS Take 1 capsule by mouth daily.    . Cyanocobalamin (VITAMIN B-12) 5000 MCG SUBL Place 1 tablet under the tongue daily.    . fexofenadine (ALLEGRA) 180 MG tablet Take 1 tablet by mouth daily as needed.    . finasteride (PROSCAR) 5 MG tablet Take 1 tablet (5 mg total) by mouth daily. 90 tablet 3  . icosapent Ethyl (VASCEPA) 1 g capsule Take 1 tablet by mouth 4 (four) times daily.    . meloxicam (MOBIC) 15 MG tablet Take by mouth.    Marland Kitchen MILK THISTLE PO Take 1 capsule by mouth daily.    . montelukast (SINGULAIR) 10 MG tablet Take by mouth.    . Multiple Vitamin (MULTIVITAMIN) tablet Take 1 tablet by mouth daily.    . rosuvastatin (CRESTOR) 40 MG tablet Take 40 mg by mouth daily.    . tadalafil (CIALIS) 5 MG tablet Take by mouth.    . traMADol (ULTRAM) 50 MG tablet Take by mouth.    . Vitamin E 180 MG (400 UNIT) CAPS Take by mouth.    . MEDROL 4 MG TBPK tablet  Take      6 PILLS FIRST DAY, 5 PILLS SECOND DAY, 4 PILLS THIRD DAY, 3 PILLS FOURTH DAY, 2 PILLS FIFTH DAY, 1 PILL SIXTH DAY (Patient not taking: Reported on 09/22/2020)     No current facility-administered medications for this visit.      REVIEW OF SYSTEMS (Negative unless checked)  Constitutional: [] Weight loss  [] Fever  [] Chills Cardiac: [] Chest pain   []   Chest pressure   [] Palpitations   [] Shortness of breath when laying flat   [] Shortness of breath at rest   [] Shortness of breath with exertion. Vascular:  [x] Pain in legs with walking   [] Pain in legs at rest   [] Pain in legs when laying flat   [x] Claudication   [] Pain in feet when walking  [] Pain in feet at rest  [] Pain in feet when laying flat   [] History of DVT   [] Phlebitis   [] Swelling in legs   [] Varicose veins   [] Non-healing ulcers Pulmonary:   [] Uses home oxygen   [] Productive cough   [] Hemoptysis   [] Wheeze  [] COPD   [] Asthma Neurologic:  [] Dizziness  [] Blackouts   [] Seizures   [] History of stroke   [] History of TIA  [] Aphasia   [] Temporary blindness   [] Dysphagia   [] Weakness or numbness in arms   [] Weakness or numbness in legs Musculoskeletal:  [x] Arthritis   [] Joint swelling   [x] Joint pain   [] Low back pain Hematologic:  [] Easy bruising  [] Easy bleeding   [] Hypercoagulable state   [] Anemic  [] Hepatitis Gastrointestinal:  [] Blood in stool   [] Vomiting blood  [x] Gastroesophageal reflux/heartburn   [] Abdominal pain Genitourinary:  [] Chronic kidney disease   [] Difficult urination  [] Frequent urination  [] Burning with urination   [] Hematuria Skin:  [] Rashes   [] Ulcers   [] Wounds Psychological:  [] History of anxiety   []  History of major depression.    Physical Exam BP (!) 145/77 (BP Location: Right Arm)   Pulse 67   Resp 16   Ht 5\' 6"  (1.676 m)   Wt 192 lb 9.6 oz (87.4 kg)   BMI 31.09 kg/m  Gen:  WD/WN, NAD.  Appears younger than stated age Head: [place]/AT, No temporalis wasting.  Ear/Nose/Throat: Hearing grossly intact, nares  w/o erythema or drainage, oropharynx w/o Erythema/Exudate Eyes: Conjunctiva clear, sclera non-icteric  Neck: trachea midline.  No JVD.  Pulmonary:  Good air movement, respirations not labored, no use of accessory muscles  Cardiac: RRR, no JVD Vascular:  Vessel Right Left  Radial Palpable Palpable                          DP  1+  2+  PT  1+  2+   Gastrointestinal:. No masses, surgical incisions, or scars. Musculoskeletal: M/S 5/5 throughout.  Extremities without ischemic changes.  No deformity or atrophy.  No edema. Neurologic: Sensation grossly intact in extremities.  Symmetrical.  Speech is fluent. Motor exam as listed above. Psychiatric: Judgment intact, Mood & affect appropriate for pt's clinical situation. Dermatologic: No rashes or ulcers noted.  No cellulitis or open wounds.    Radiology VAS [place] LOWER EXTREMITY ARTERIAL DUPLEX  Result Date: 09/01/2020 LOWER EXTREMITY ARTERIAL DUPLEX STUDY Indications: Claudication, and peripheral artery disease. High Risk Factors: Hypertension, no history of smoking, coronary artery disease. Other Factors: Patient complains of right calf pain. He noticed the significance                of the pain since he started cardiac rehab. He denies any left                leg claudication symptosm.                CABG x 3.  Current ABI: 08/13/20-Right ABi 0.74; Left ABI 1.09 Performing Technologist: Wilkie Aye RVT  Examination Guidelines: A complete evaluation includes B-mode imaging, spectral Doppler, color Doppler, and power Doppler as needed of  all accessible portions of each vessel. Bilateral testing is considered an integral part of a complete examination. Limited examinations for reoccurring indications may be performed as noted. Aorta: +-----------+-------+----------+----------+            AP (cm)Trans (cm)PSV (cm/s) +-----------+-------+----------+----------+ Supraceliac1.80   1.70      104        +-----------+-------+----------+----------+  Proximal                    86         +-----------+-------+----------+----------+ Mid                         97         +-----------+-------+----------+----------+  +----------+--------+-----+---------------+---------------+------------------+ RIGHT     PSV cm/sRatioStenosis       Waveform       Comments           +----------+--------+-----+---------------+---------------+------------------+ CIA Prox  113                         triphasic                         +----------+--------+-----+---------------+---------------+------------------+ CIA Mid   155                         triphasic                         +----------+--------+-----+---------------+---------------+------------------+ CIA Distal129                         biphasic                          +----------+--------+-----+---------------+---------------+------------------+ EIA Prox  94                          biphasic                          +----------+--------+-----+---------------+---------------+------------------+ EIA Mid   127                         triphasic                         +----------+--------+-----+---------------+---------------+------------------+ EIA Distal130                         triphasic                         +----------+--------+-----+---------------+---------------+------------------+ CFA Prox  108                         biphasic                          +----------+--------+-----+---------------+---------------+------------------+ CFA Distal98                          biphasic                          +----------+--------+-----+---------------+---------------+------------------+ DFA  92                          biphasic                          +----------+--------+-----+---------------+---------------+------------------+ SFA Prox  77                          barely biphasic                    +----------+--------+-----+---------------+---------------+------------------+ SFA Mid   18                          monophasic                        +----------+--------+-----+---------------+---------------+------------------+ SFA Distal151     8.4  75-99% stenosis               Per velocity ratio +----------+--------+-----+---------------+---------------+------------------+ POP Prox  37                          monophasic                        +----------+--------+-----+---------------+---------------+------------------+ POP Distal34                          monophasic                        +----------+--------+-----+---------------+---------------+------------------+ TP Trunk  41                          monophasic                        +----------+--------+-----+---------------+---------------+------------------+ ATA Prox  36                          monophasic                        +----------+--------+-----+---------------+---------------+------------------+ PTA Mid   44                          biphasic                          +----------+--------+-----+---------------+---------------+------------------+ PERO Mid  23                          monophasic                        +----------+--------+-----+---------------+---------------+------------------+ A focal velocity elevation of 151 cm/s was obtained at Mid/Distal SFA with post stenotic turbulence with a VR of 8.4. Findings are characteristic of 75-99% stenosis.  +----------+--------+-----+---------------+---------+--------+ LEFT      PSV cm/sRatioStenosis       Waveform Comments +----------+--------+-----+---------------+---------+--------+ CIA Prox  97                          triphasic         +----------+--------+-----+---------------+---------+--------+ CIA Mid  90                          biphasic           +----------+--------+-----+---------------+---------+--------+ CIA Distal87                          biphasic          +----------+--------+-----+---------------+---------+--------+ EIA Prox  129                         triphasic         +----------+--------+-----+---------------+---------+--------+ EIA Mid   107                         triphasic         +----------+--------+-----+---------------+---------+--------+ EIA Distal111                         triphasic         +----------+--------+-----+---------------+---------+--------+ CFA Prox  104                         triphasic         +----------+--------+-----+---------------+---------+--------+ CFA Distal97                          triphasic         +----------+--------+-----+---------------+---------+--------+ DFA       47                          biphasic          +----------+--------+-----+---------------+---------+--------+ SFA Prox  88                          triphasic         +----------+--------+-----+---------------+---------+--------+ SFA Mid   49                          triphasic         +----------+--------+-----+---------------+---------+--------+ SFA Distal146     3.0  50-74% stenosistriphasic         +----------+--------+-----+---------------+---------+--------+ POP Prox  79                          triphasic         +----------+--------+-----+---------------+---------+--------+ POP Distal43                          biphasic          +----------+--------+-----+---------------+---------+--------+ TP Trunk  46                          biphasic          +----------+--------+-----+---------------+---------+--------+ ATA Prox  69                          biphasic          +----------+--------+-----+---------------+---------+--------+ PTA Mid   65                          triphasic          +----------+--------+-----+---------------+---------+--------+  PERO Mid  56                          triphasic         +----------+--------+-----+---------------+---------+--------+ A focal velocity elevation of 146 cm/s was obtained at Distal SFA with a VR of 3.0. Findings are characteristic of 50-74% stenosis.  Summary: Right: Atherosclerosis in the aorta, iliac, femoral, popliteal and tibial arteries. 75-99% stenosis in the mid/distal SFA Three vessel run off. Acoustic shadowing in the mid/distal SFA therefore, can not rule out high grade stenosis versus occlusion.  Left: Atherosclerosis in the aorta, iliac, femoral, popliteal and tibial arteries. 50-74% stenosis in the distal SFA per velocity ratio. Three vessel run off.  See table(s) above for measurements and observations. Vascular consult recommended. Electronically signed by Kathlyn Sacramento MD on 09/01/2020 at 1:34:11 PM.    Final     Labs Recent Results (from the past 2160 hour(s))  Bladder Scan (Post Void Residual) in office     Status: None   Collection Time: 08/05/20  1:40 PM  Result Value Ref Range   Scan Result 36mL     Assessment/Plan:  Atherosclerosis of native arteries of extremity with intermittent claudication (HCC) His right ABI 0.74 with a normal left ABI of 1.  Duplex shows elevated velocities in the right SFA consistent with a relatively high-grade stenosis.  The left SFA has mild to moderately elevated velocities more consistent with a moderate stenosis.  Recommend:  The patient has experienced increased symptoms and is now describing lifestyle limiting claudication and mild rest pain.   Given the severity of the patient's lower extremity symptoms the patient should undergo angiography and intervention.  Risk and benefits were reviewed the patient.  Indications for the procedure were reviewed.  All questions were answered, the patient agrees to proceed.   The patient should continue walking and begin a more formal  exercise program.  The patient should continue antiplatelet therapy and aggressive treatment of the lipid abnormalities  The patient will follow up with me after the angiogram.   HTN (hypertension) blood pressure control important in reducing the progression of atherosclerotic disease. On appropriate oral medications.   CAD (coronary artery disease) Doing well now status post coronary artery bypass grafting.  Hyperlipidemia, mixed lipid control important in reducing the progression of atherosclerotic disease.        Leotis Pain 09/22/2020, 10:32 AM   This note was created with Dragon medical transcription system.  Any errors from dictation are unintentional.

## 2020-09-22 NOTE — Assessment & Plan Note (Signed)
lipid control important in reducing the progression of atherosclerotic disease.   

## 2020-09-29 ENCOUNTER — Other Ambulatory Visit: Payer: 59

## 2020-09-30 ENCOUNTER — Other Ambulatory Visit: Payer: Self-pay

## 2020-09-30 ENCOUNTER — Other Ambulatory Visit
Admission: RE | Admit: 2020-09-30 | Discharge: 2020-09-30 | Disposition: A | Discharge status: Home / Self Care | Payer: 59 | Source: Ambulatory Visit | Attending: Vascular Surgery | Admitting: Vascular Surgery

## 2020-09-30 DIAGNOSIS — Z01812 Encounter for preprocedural laboratory examination: Secondary | ICD-10-CM | POA: Insufficient documentation

## 2020-09-30 DIAGNOSIS — Z20822 Contact with and (suspected) exposure to covid-19: Secondary | ICD-10-CM | POA: Insufficient documentation

## 2020-09-30 LAB — SARS CORONAVIRUS 2 (TAT 6-24 HRS): SARS Coronavirus 2: NEGATIVE

## 2020-10-01 ENCOUNTER — Encounter: Payer: Self-pay | Admitting: Vascular Surgery

## 2020-10-01 ENCOUNTER — Encounter: Payer: Self-pay | Admitting: Registered Nurse

## 2020-10-01 ENCOUNTER — Other Ambulatory Visit: Payer: Self-pay

## 2020-10-01 ENCOUNTER — Encounter
Admission: RE | Disposition: A | Discharge status: Home / Self Care | Payer: Self-pay | Source: Home / Self Care | Attending: Vascular Surgery

## 2020-10-01 ENCOUNTER — Other Ambulatory Visit (INDEPENDENT_AMBULATORY_CARE_PROVIDER_SITE_OTHER): Payer: Self-pay | Admitting: Nurse Practitioner

## 2020-10-01 ENCOUNTER — Ambulatory Visit
Admission: RE | Admit: 2020-10-01 | Discharge: 2020-10-01 | Disposition: A | Discharge status: Home / Self Care | Payer: 59 | Attending: Vascular Surgery | Admitting: Vascular Surgery

## 2020-10-01 DIAGNOSIS — I70219 Atherosclerosis of native arteries of extremities with intermittent claudication, unspecified extremity: Secondary | ICD-10-CM

## 2020-10-01 DIAGNOSIS — I2510 Atherosclerotic heart disease of native coronary artery without angina pectoris: Secondary | ICD-10-CM | POA: Insufficient documentation

## 2020-10-01 DIAGNOSIS — Z7982 Long term (current) use of aspirin: Secondary | ICD-10-CM | POA: Diagnosis not present

## 2020-10-01 DIAGNOSIS — I10 Essential (primary) hypertension: Secondary | ICD-10-CM | POA: Insufficient documentation

## 2020-10-01 DIAGNOSIS — E782 Mixed hyperlipidemia: Secondary | ICD-10-CM | POA: Insufficient documentation

## 2020-10-01 DIAGNOSIS — Z881 Allergy status to other antibiotic agents status: Secondary | ICD-10-CM | POA: Insufficient documentation

## 2020-10-01 DIAGNOSIS — I70211 Atherosclerosis of native arteries of extremities with intermittent claudication, right leg: Secondary | ICD-10-CM | POA: Insufficient documentation

## 2020-10-01 DIAGNOSIS — Z951 Presence of aortocoronary bypass graft: Secondary | ICD-10-CM | POA: Diagnosis not present

## 2020-10-01 DIAGNOSIS — Z79899 Other long term (current) drug therapy: Secondary | ICD-10-CM | POA: Diagnosis not present

## 2020-10-01 HISTORY — PX: LOWER EXTREMITY ANGIOGRAPHY: CATH118251

## 2020-10-01 LAB — CREATININE, SERUM
Creatinine, Ser: 0.9 mg/dL (ref 0.61–1.24)
GFR, Estimated: >60 mL/min (ref >60)

## 2020-10-01 LAB — BUN: BUN: 19 mg/dL (ref 8–23)

## 2020-10-01 SURGERY — LOWER EXTREMITY ANGIOGRAPHY
Anesthesia: Moderate Sedation | Site: Leg Lower | Laterality: Right

## 2020-10-01 MED ORDER — CLOPIDOGREL BISULFATE 75 MG PO TABS: 75.0000 mg | ORAL_TABLET | Freq: Every day | ORAL | 11 refills | Status: DC

## 2020-10-01 MED ORDER — FAMOTIDINE 20 MG PO TABS: 40.0000 mg | ORAL_TABLET | Freq: Once | ORAL | Status: DC | PRN

## 2020-10-01 MED ORDER — ACETAMINOPHEN 325 MG PO TABS: 650.0000 mg | ORAL_TABLET | ORAL | Status: DC | PRN

## 2020-10-01 MED ORDER — METHYLPREDNISOLONE SODIUM SUCC 125 MG IJ SOLR: 125.0000 mg | Freq: Once | INTRAMUSCULAR | Status: DC | PRN

## 2020-10-01 MED ORDER — LABETALOL HCL 5 MG/ML IV SOLN: 10.0000 mg | INTRAVENOUS | Status: DC | PRN

## 2020-10-01 MED ORDER — FENTANYL CITRATE (PF) 100 MCG/2ML IJ SOLN
INTRAMUSCULAR | Status: DC | PRN
  Administered 2020-10-01: 50 ug via INTRAVENOUS
  Administered 2020-10-01: 25 ug via INTRAVENOUS

## 2020-10-01 MED ORDER — HEPARIN SODIUM (PORCINE) 1000 UNIT/ML IJ SOLN
INTRAMUSCULAR | Status: AC
  Filled 2020-10-01: qty 1

## 2020-10-01 MED ORDER — CEFAZOLIN SODIUM-DEXTROSE 2-4 GM/100ML-% IV SOLN
2.0000 g | Freq: Once | INTRAVENOUS | Status: AC
  Administered 2020-10-01: 2 g via INTRAVENOUS

## 2020-10-01 MED ORDER — CLOPIDOGREL BISULFATE 75 MG PO TABS: 75.0000 mg | ORAL_TABLET | Freq: Every day | ORAL | Status: DC

## 2020-10-01 MED ORDER — SODIUM CHLORIDE 0.9 % IV SOLN: INTRAVENOUS | Status: DC

## 2020-10-01 MED ORDER — MIDAZOLAM HCL 2 MG/2ML IJ SOLN
INTRAMUSCULAR | Status: DC | PRN
  Administered 2020-10-01: 1 mg via INTRAVENOUS
  Administered 2020-10-01: 2 mg via INTRAVENOUS

## 2020-10-01 MED ORDER — ONDANSETRON HCL 4 MG/2ML IJ SOLN: 4.0000 mg | Freq: Four times a day (QID) | INTRAMUSCULAR | Status: DC | PRN

## 2020-10-01 MED ORDER — HEPARIN SODIUM (PORCINE) 1000 UNIT/ML IJ SOLN
INTRAMUSCULAR | Status: DC | PRN
  Administered 2020-10-01: 5000 [IU] via INTRAVENOUS

## 2020-10-01 MED ORDER — FENTANYL CITRATE (PF) 100 MCG/2ML IJ SOLN
INTRAMUSCULAR | Status: AC
  Filled 2020-10-01: qty 2

## 2020-10-01 MED ORDER — IODIXANOL 320 MG/ML IV SOLN
INTRAVENOUS | Status: DC | PRN
  Administered 2020-10-01: 65 mL

## 2020-10-01 MED ORDER — SODIUM CHLORIDE 0.9% FLUSH: 3.0000 mL | Freq: Two times a day (BID) | INTRAVENOUS | Status: DC

## 2020-10-01 MED ORDER — SODIUM CHLORIDE 0.9% FLUSH: 3.0000 mL | INTRAVENOUS | Status: DC | PRN

## 2020-10-01 MED ORDER — MIDAZOLAM HCL 2 MG/ML PO SYRP: 8.0000 mg | ORAL_SOLUTION | Freq: Once | ORAL | Status: DC | PRN

## 2020-10-01 MED ORDER — SODIUM CHLORIDE 0.9 % IV SOLN: 250.0000 mL | INTRAVENOUS | Status: DC | PRN

## 2020-10-01 MED ORDER — HYDROMORPHONE HCL 1 MG/ML IJ SOLN: 1.0000 mg | Freq: Once | INTRAMUSCULAR | Status: DC | PRN

## 2020-10-01 MED ORDER — DIPHENHYDRAMINE HCL 50 MG/ML IJ SOLN: 50.0000 mg | Freq: Once | INTRAMUSCULAR | Status: DC | PRN

## 2020-10-01 MED ORDER — HYDRALAZINE HCL 20 MG/ML IJ SOLN
5.0000 mg | INTRAMUSCULAR | Status: DC | PRN
Start: 2020-10-01 — End: 2020-10-01

## 2020-10-01 MED ORDER — MIDAZOLAM HCL 5 MG/5ML IJ SOLN
INTRAMUSCULAR | Status: AC
  Filled 2020-10-01: qty 5

## 2020-10-01 SURGICAL SUPPLY — 26 items
BALLN DORADO 6X200X135 (BALLOONS) ×2
BALLN LUTONIX 018 5X220X130 (BALLOONS) ×2
BALLN ULTRVRSE 3X300X150 (BALLOONS) ×2
BALLN ULTRVRSE 3X300X150 OTW (BALLOONS) ×1
BALLN ULTRVRSE 5X220X150 (BALLOONS) ×2
BALLOON DORADO 6X200X135 (BALLOONS) ×1 IMPLANT
BALLOON LUTONIX 018 5X220X130 (BALLOONS) ×1 IMPLANT
BALLOON ULTRVRSE 3X300X150 OTW (BALLOONS) ×1 IMPLANT
BALLOON ULTRVRSE 5X220X150 (BALLOONS) ×1 IMPLANT
CATH ANGIO 5F PIGTAIL 65CM (CATHETERS) ×2 IMPLANT
CATH BEACON 5 .038 100 VERT TP (CATHETERS) ×2 IMPLANT
CATH NAVICROSS ANGLED 135CM (MICROCATHETER) ×2 IMPLANT
COVER PROBE U/S 5X48 (MISCELLANEOUS) ×2 IMPLANT
DEVICE STARCLOSE SE CLOSURE (Vascular Products) ×2 IMPLANT
DEVICE TORQUE (MISCELLANEOUS) ×2 IMPLANT
GLIDEWIRE ADV .035X260CM (WIRE) ×2 IMPLANT
KIT ENCORE 26 ADVANTAGE (KITS) ×2 IMPLANT
PACK ANGIOGRAPHY (CUSTOM PROCEDURE TRAY) ×2 IMPLANT
SHEATH ANL2 6FRX45 HC (SHEATH) ×2 IMPLANT
SHEATH BRITE TIP 5FRX11 (SHEATH) ×2 IMPLANT
STENT LIFESTENT 5F 7X60X135 (Permanent Stent) ×2 IMPLANT
STENT VIABAHN 6X250X120 (Permanent Stent) ×2 IMPLANT
SYR MEDRAD MARK 7 150ML (SYRINGE) ×2 IMPLANT
TUBING CONTRAST HIGH PRESS 72 (TUBING) ×2 IMPLANT
WIRE G V18X300CM (WIRE) ×2 IMPLANT
WIRE GUIDERIGHT .035X150 (WIRE) ×2 IMPLANT

## 2020-10-01 NOTE — Interval H&P Note (Signed)
History and Physical Interval Note:  10/01/2020 8:51 AM  Clayton Powell  has presented today for surgery, with the diagnosis of RT Leg Angiography   PAD with claudication   BARD Rep   Pt to have Covid 3-8.  The various methods of treatment have been discussed with the patient and family. After consideration of risks, benefits and other options for treatment, the patient has consented to  Procedure(s): LOWER EXTREMITY ANGIOGRAPHY (Right) as a surgical intervention.  The patient's history has been reviewed, patient examined, no change in status, stable for surgery.  I have reviewed the patient's chart and labs.  Questions were answered to the patient's satisfaction.     Leotis Pain

## 2020-10-01 NOTE — Op Note (Signed)
[facility]  Percutaneous Study/Intervention Procedural Note   Date of Surgery: 10/01/2020  Surgeon(s):DEW,JASON    Assistants:none  Pre-operative Diagnosis: PAD with claudication right lower extremity  Post-operative diagnosis:  Same  Procedure(s) Performed:             1.  Ultrasound guidance for vascular access left femoral artery             2.  Catheter placement into right common femoral artery from left femoral approach             3.  Aortogram and selective right lower extremity angiogram             4.  Percutaneous transluminal angioplasty of right tibioperoneal trunk and proximal posterior tibial artery with 3 mm diameter angioplasty balloon             5.   Percutaneous transluminal angioplasty of the right SFA and popliteal arteries with 5 mm diameter Lutonix drug-coated angioplasty balloon and 5 mm diameter conventional angioplasty balloon  6.  Stent placement x2 to the right SFA and popliteal arteries.  A 6 mm diameter by 25 cm length Viabahn stent is placed from the below-knee popliteal artery to Hunter's canal and a 7 mm diameter by 6 cm length life stent is placed in the distal SFA.             7.  StarClose closure device left femoral artery  EBL: 10 cc  Contrast: 65 cc  Fluoro Time: 13.2 minutes  Moderate Conscious Sedation Time: approximately 57 minutes using 3 mg of Versed and 75 mcg of Fentanyl              Indications:  Patient is a 75 y.o.male with disabling claudication symptoms of the right leg. The patient has noninvasive study showing significant reduction in the right ABI. The patient is brought in for angiography for further evaluation and potential treatment.  Risks and benefits are discussed and informed consent is obtained.   Procedure:  The patient was identified and appropriate procedural time out was performed.  The patient was then placed supine on the table and prepped and draped in the usual sterile fashion. Moderate  conscious sedation was administered during a face to face encounter with the patient throughout the procedure with my supervision of the RN administering medicines and monitoring the patient's vital signs, pulse oximetry, telemetry and mental status throughout from the start of the procedure until the patient was taken to the recovery room. Ultrasound was used to evaluate the left common femoral artery.  It was patent .  A digital ultrasound image was acquired.  A Seldinger needle was used to access the left common femoral artery under direct ultrasound guidance and a permanent image was performed.  A 0.035 J wire was advanced without resistance and a 5Fr sheath was placed.  Pigtail catheter was placed into the aorta and an AP aortogram was performed. This demonstrated normal renal arteries and normal aorta and iliac segments without significant stenosis. I then crossed the aortic bifurcation and advanced to the right femoral head. Selective right lower extremity angiogram was then performed. This demonstrated a fairly normal common femoral artery, profunda femoris artery, and proximal superficial femoral artery.  There is mild disease in the mid superficial femoral artery with occlusion of the distal superficial femoral artery with reconstitution of the above-knee popliteal artery through collaterals although distal filling was somewhat sluggish and difficult to interpret.  There appeared to  be at least a moderate stenosis in the 60 to 70% range in the tibioperoneal trunk and peroneal and posterior tibial artery that were continuous below this without focal stenosis.  The anterior tibial artery was small and appeared somewhat diffusely diseased and did not appear to get to the foot, although again flow was sluggish. It was felt that it was in the patient's best interest to proceed with intervention after these images to avoid a second procedure and a larger amount of contrast and fluoroscopy based off of the  findings from the initial angiogram. The patient was systemically heparinized and a 6 [place] Ansell sheath was then placed over the [employer] wire. I then used a Kumpe catheter and the advantage wire to get down into the distal SFA and popliteal occlusion.  This was a very calcified difficult to cross occlusion, but tediously I was able to work my way through with the advantage wire and eventually a Nava cross catheter.  This was taken all the way down into the posterior tibial artery where intraluminal flow was confirmed we switched to an 018 wire.  To treat the tibioperoneal trunk disease, a 3 mm diameter angioplasty balloon was used to treat the tibioperoneal trunk and proximal posterior tibial artery inflating this to 12 atm for 1 minute.  This was also used to predilate the highly calcific distal SFA and popliteal lesion before definitive treatment.  I then upsized to a 5 mm diameter by 22 cm length Lutonix drug-coated angioplasty balloon but even this had difficulty crossing the lesion.  This was used to treat the most proximal popliteal artery and SFA and inflated to 12 atm for 1 minute.  I then used a 5 mm diameter by 22 cm length conventional angioplasty balloon to treat the remainder of the popliteal artery up to Hunter's canal.  This is inflated to 12 atm for 1 minute as well.  The SFA and popliteal lesions remain greater than 50% but the tibioperoneal trunk had less than 10% stenosis.  I then used a 6 mm diameter by 25 cm length Viabahn stent from the below-knee popliteal artery up to Hunter's canal.  This was slightly undersized at the proximal edge and there was a large collateral just above the Viabahn stent that I then extended a 7 mm diameter by 6 cm length life stent in the distal SFA.  The stents were then postdilated with a 6 mm diameter high-pressure angioplasty balloon with excellent angiographic completion result and less than 10% residual stenosis. I elected to terminate the procedure.  The sheath was removed and StarClose closure device was deployed in the left femoral artery with excellent hemostatic result. The patient was taken to the recovery room in stable condition having tolerated the procedure well.  Findings:               Aortogram:  Normal renal arteries, normal aorta and iliac arteries without significant stenosis             Right lower Extremity:  Fairly normal common femoral artery, profunda femoris artery, and proximal superficial femoral artery.  There is mild disease in the mid superficial femoral artery with occlusion of the distal superficial femoral artery with reconstitution of the above-knee popliteal artery through collaterals although distal filling was somewhat sluggish and difficult to interpret.  There appeared to be at least a moderate stenosis in the 60 to 70% range in the tibioperoneal trunk and peroneal and posterior tibial artery that were continuous below this  without focal stenosis.  The anterior tibial artery was small and appeared somewhat diffusely diseased and did not appear to get to the foot, although again flow was sluggish.   Disposition: Patient was taken to the recovery room in stable condition having tolerated the procedure well.  Complications: None  Leotis Pain 10/01/2020 10:09 AM   This note was created with Dragon Medical transcription system. Any errors in dictation are purely unintentional.

## 2020-10-01 NOTE — Discharge Instructions (Signed)

## 2020-10-02 ENCOUNTER — Telehealth (INDEPENDENT_AMBULATORY_CARE_PROVIDER_SITE_OTHER): Payer: Self-pay

## 2020-10-02 NOTE — Telephone Encounter (Signed)
Patient had left a voicemail wanting to know if it was safe to exercise and informing that leg was feeling good. The patient has right le angio on yesterday. I informed that patient that walking is fine,also to elevate if swelling starts,soreness and pain is to expected. The patient stated that he has some Tramadol and will take it as needed Patient was made aware and verbalized understanding.

## 2020-10-18 ENCOUNTER — Other Ambulatory Visit: Payer: Self-pay | Admitting: Surgical

## 2020-10-28 ENCOUNTER — Other Ambulatory Visit (INDEPENDENT_AMBULATORY_CARE_PROVIDER_SITE_OTHER): Payer: Self-pay | Admitting: Vascular Surgery

## 2020-10-28 DIAGNOSIS — Z95820 Peripheral vascular angioplasty status with implants and grafts: Secondary | ICD-10-CM

## 2020-10-29 ENCOUNTER — Ambulatory Visit (INDEPENDENT_AMBULATORY_CARE_PROVIDER_SITE_OTHER): Payer: 59 | Admitting: Nurse Practitioner

## 2020-10-29 ENCOUNTER — Other Ambulatory Visit: Payer: Self-pay

## 2020-10-29 ENCOUNTER — Encounter (INDEPENDENT_AMBULATORY_CARE_PROVIDER_SITE_OTHER): Payer: Self-pay | Admitting: Nurse Practitioner

## 2020-10-29 ENCOUNTER — Ambulatory Visit (INDEPENDENT_AMBULATORY_CARE_PROVIDER_SITE_OTHER): Payer: 59

## 2020-10-29 VITALS — BP 143/85 | HR 73 | Ht 66.0 in | Wt 190.0 lb

## 2020-10-29 DIAGNOSIS — J301 Allergic rhinitis due to pollen: Secondary | ICD-10-CM | POA: Insufficient documentation

## 2020-10-29 DIAGNOSIS — E782 Mixed hyperlipidemia: Secondary | ICD-10-CM | POA: Diagnosis not present

## 2020-10-29 DIAGNOSIS — I70211 Atherosclerosis of native arteries of extremities with intermittent claudication, right leg: Secondary | ICD-10-CM | POA: Diagnosis not present

## 2020-10-29 DIAGNOSIS — I10 Essential (primary) hypertension: Secondary | ICD-10-CM

## 2020-10-29 DIAGNOSIS — R062 Wheezing: Secondary | ICD-10-CM | POA: Insufficient documentation

## 2020-10-29 DIAGNOSIS — Z95820 Peripheral vascular angioplasty status with implants and grafts: Secondary | ICD-10-CM

## 2020-10-29 DIAGNOSIS — J300 Vasomotor rhinitis: Secondary | ICD-10-CM | POA: Insufficient documentation

## 2020-11-02 NOTE — Progress Notes (Signed)
Subjective:    Patient ID: Clayton Powell, male    DOB: 1945/10/26, 75 y.o.   MRN: [id number] Chief Complaint  Patient presents with  . Follow-up    4 wk [facility] U/S LE Angio    The patient returns to the office for followup and review status post angiogram with intervention. The patient notes improvement in the lower extremity symptoms. No interval shortening of the patient's claudication distance or rest pain symptoms. Previous wounds have now healed.  No new ulcers or wounds have occurred since the last visit.  There have been no significant changes to the patient's overall health care.  The patient denies amaurosis fugax or recent TIA symptoms. There are no recent neurological changes noted. The patient denies history of DVT, PE or superficial thrombophlebitis. The patient denies recent episodes of angina or shortness of breath.   ABI's Rt=1.27 and Lt=1.25  (previous ABI's Rt=0.74 and Lt=1) Duplex US of the bilateral lower extremity arterial system shows    Review of Systems  Cardiovascular: Negative for leg swelling.  All other systems reviewed and are negative.      Objective:   Physical Exam Vitals reviewed.  HENT:     Head: Normocephalic.  Cardiovascular:     Rate and Rhythm: Normal rate.     Pulses: Normal pulses.  Pulmonary:     Effort: Pulmonary effort is normal.  Neurological:     Mental Status: He is alert and oriented to person, place, and time.  Psychiatric:        Mood and Affect: Mood normal.        Behavior: Behavior normal.        Thought Content: Thought content normal.        Judgment: Judgment normal.     BP (!) 143/85   Pulse 73   Ht 5\' 6"  (1.676 m)   Wt 190 lb (86.2 kg)   BMI 30.67 kg/m   Past Medical History:  Diagnosis Date  . Allergy   . Anginal pain ([place])   . BPH (benign prostatic hypertrophy) 6/07  . CAD (coronary artery disease) 2010   dx made by coronary calcium scoring, Normal stress test in 2010.   Marland Kitchen GERD (gastroesophageal  reflux disease) 12/05  . HLD (hyperlipidemia) 3/99  . HTN (hypertension) 2002   no meds  . Left rotator cuff tear 08/12/2011   Per Dr. Mardelle Matte 2013 L rotator cuff tear on MRI 2013   . Right rotator cuff tear 05/24/2013  . Wears glasses     Social History   Socioeconomic History  . Marital status: Married    Spouse name: Not on file  . Number of children: Not on file  . Years of education: Not on file  . Highest education level: Not on file  Occupational History  . Not on file  Tobacco Use  . Smoking status: Never Smoker  . Smokeless tobacco: Never Used  Substance and Sexual Activity  . Alcohol use: Yes    Alcohol/week: 0.0 standard drinks    Comment: beer, occ  . Drug use: No  . Sexual activity: Yes  Other Topics Concern  . 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    Plays guitar   Works out regularly.   Social Determinants of Health   Financial Resource Strain: Not on file  Food Insecurity: Not on  file  Transportation Needs: Not on file  Physical Activity: Not on file  Stress: Not on file  Social Connections: Not on file  Intimate Partner Violence: Not on file    Past Surgical History:  Procedure Laterality Date  . admitted to [facility] chest pain  12/15/05   Dr. Clayborn Bigness  . BLEPHAROPLASTY  10/11   Dr. Loletta Specter   . bone spur removed Right   . COLONOSCOPY  09/24/04   nml; Dr. Epifanio Lesches   . CORONARY ARTERY BYPASS GRAFT N/A 05/29/2020   Procedure: CORONARY ARTERY BYPASS GRAFTING (CABG) TIMES THREE USING LIMA to LAD; ENDOSCOPIC HARVESTED RIGHT GREATER SAPHENOUS VEIN: SVG to PD; SVG to RAMUS.;  Surgeon: Grace Isaac, MD;  Location: [place];  Service: Open Heart Surgery;  Laterality: N/A;  . EGD esophagitis by bx  01/26/06   gastritis bx neg h-pylori  . ENDOVEIN HARVEST OF GREATER SAPHENOUS VEIN Right 05/29/2020   Procedure: ENDOVEIN HARVEST OF GREATER SAPHENOUS VEIN;   Surgeon: Grace Isaac, MD;  Location: [place];  Service: Open Heart Surgery;  Laterality: Right;  . LEFT HEART CATH AND CORONARY ANGIOGRAPHY N/A 05/26/2020   Procedure: LEFT HEART CATH AND CORONARY ANGIOGRAPHY;  Surgeon: Corey Skains, MD;  Location: [place];  Service: Cardiovascular;  Laterality: N/A;  . LOWER EXTREMITY ANGIOGRAPHY Right 10/01/2020   Procedure: LOWER EXTREMITY ANGIOGRAPHY;  Surgeon: Algernon Huxley, MD;  Location: [place] [facility];  Service: Cardiovascular;  Laterality: Right;  . POLYPECTOMY    . right and left prostate needle bx  05/02/03   acute inflammation; Dr. Gaynelle Arabian   . SHOULDER ARTHROSCOPY W/ ROTATOR CUFF REPAIR  12/2011   left  . SHOULDER ARTHROSCOPY WITH ROTATOR CUFF REPAIR AND SUBACROMIAL DECOMPRESSION Right 05/24/2013   Procedure: RIGHT SHOULDER ARTHROSCOPY WITH ROTATOR CUFF REPAIR AND SUBACROMIAL DECOMPRESSION AND PARTIAL ACROMIOPLSTY WITH CORACOACROMIAL RELEASE, DISTAL CLAVICULECTOMY, LABRIAL DEBRIDEMENT;  Surgeon: Johnny Bridge, MD;  Location: [place];  Service: Orthopedics;  Laterality: Right;  . spect ETT nml  12/16/05  . TEE WITHOUT CARDIOVERSION N/A 05/29/2020   Procedure: TRANSESOPHAGEAL ECHOCARDIOGRAM (TEE);  Surgeon: Grace Isaac, MD;  Location: [place];  Service: Open Heart Surgery;  Laterality: N/A;  . UPPER GASTROINTESTINAL ENDOSCOPY    . WISDOM TOOTH EXTRACTION      Family History  Problem Relation Age of Onset  . Stroke Mother   . Glaucoma Other        grandmother at 21  . Barrett's esophagus Maternal Uncle   . Prostate cancer Neg Hx   . Colon cancer Neg Hx   . Colon polyps Neg Hx   . Esophageal cancer Neg Hx   . Rectal cancer Neg Hx   . Stomach cancer Neg Hx     Allergies  Allergen Reactions  . Ciprofloxacin Hcl Rash  . Ciprofloxacin Hcl Rash  . Quinolones Rash    CBC Latest Ref Rng & Units 06/01/2020 05/31/2020 05/30/2020  WBC 4.0 - 10.5 K/uL 9.5 9.5 10.2  Hemoglobin 13.0 - 17.0 g/dL  10.8(L) 10.1(L) 11.5(L)  Hematocrit 39.0 - 52.0 % 32.9(L) 31.4(L) 35.3(L)  Platelets 150 - 400 K/uL 141(L) 117(L) 132(L)      CMP     Component Value Date/Time   NA 140 06/02/2020 0824   NA 140 05/12/2012 1240   K 3.9 06/02/2020 0824   K 4.2 05/12/2012 1240   CL 106 06/02/2020 0824   CL 107 05/12/2012 1240   CO2 25 06/02/2020 0824  CO2 26 05/12/2012 1240   GLUCOSE 102 (H) 06/02/2020 0824   GLUCOSE 101 (H) 05/12/2012 1240   BUN 19 10/01/2020 0819   BUN 21 (H) 05/12/2012 1240   CREATININE 0.90 10/01/2020 0819   CREATININE 0.86 05/12/2012 1240   CALCIUM 8.3 (L) 06/02/2020 0824   CALCIUM 8.9 05/12/2012 1240   PROT 7.1 05/25/2020 1715   PROT 7.4 01/17/2012 0807   ALBUMIN 4.4 05/25/2020 1715   ALBUMIN 3.9 01/17/2012 0807   AST 34 05/25/2020 1715   AST 57 (H) 01/17/2012 0807   ALT 29 05/25/2020 1715   ALT 49 01/17/2012 0807   ALKPHOS 73 05/25/2020 1715   ALKPHOS 74 01/17/2012 0807   BILITOT 0.8 05/25/2020 1715   BILITOT 0.8 01/17/2012 0807   GFRNONAA >60 10/01/2020 0819   GFRNONAA >60 05/12/2012 1240   GFRAA >60 05/12/2012 1240     VAS [place] ABI WITH/WO TBI  Result Date: 10/30/2020 LOWER EXTREMITY DOPPLER STUDY Indications: Peripheral artery disease.  Vascular Interventions: 10/01/2020: Aortogram and Selective Right Lower                         Extremity Angiogram. PTA of the Right Tibioperoneal                         Trunk and ProximalPosterior Tibial Artery. PTA of the                         Right SFA and Popliteal Artery. Stent [placement x2 to                         the Right SFA and Popliteal Artery. A 63mm diameter by                         25cm length Viabahn stent is placed from below -knee                         Popliteal Artery to Hunter's Canal and a 61mm diameter by                         99mm length Life stent is placed in the Distal SFA. Performing Technologist: Almira Coaster RVS  Examination Guidelines: A complete evaluation includes at minimum, Doppler  waveform signals and systolic blood pressure reading at the level of bilateral brachial, anterior tibial, and posterior tibial arteries, when vessel segments are accessible. Bilateral testing is considered an integral part of a complete examination. Photoelectric Plethysmograph (PPG) waveforms and toe systolic pressure readings are included as required and additional duplex testing as needed. Limited examinations for reoccurring indications may be performed as noted.  ABI Findings: +---------+------------------+-----+---------+--------+ Right    Rt Pressure (mmHg)IndexWaveform Comment  +---------+------------------+-----+---------+--------+ Brachial 146                                      +---------+------------------+-----+---------+--------+ ATA      163               1.12 triphasic         +---------+------------------+-----+---------+--------+ PTA      185               1.27 triphasic         +---------+------------------+-----+---------+--------+  Great Toe181               1.24 Normal            +---------+------------------+-----+---------+--------+ +---------+------------------+-----+---------+-------+ Left     Lt Pressure (mmHg)IndexWaveform Comment +---------+------------------+-----+---------+-------+ Brachial 138                                     +---------+------------------+-----+---------+-------+ ATA      161               1.10 triphasic        +---------+------------------+-----+---------+-------+ PTA      182               1.25 triphasic        +---------+------------------+-----+---------+-------+ Great Toe208               1.42 Normal           +---------+------------------+-----+---------+-------+ +-------+-----------+-----------+------------+------------+ ABI/TBIToday's ABIToday's TBIPrevious ABIPrevious TBI +-------+-----------+-----------+------------+------------+ Right  1.27       1.24                                 +-------+-----------+-----------+------------+------------+ Left   1.25       1.42                                +-------+-----------+-----------+------------+------------+  Summary: Right: Resting right ankle-brachial index is within normal range. No evidence of significant right lower extremity arterial disease. The right toe-brachial index is normal. Left: Resting left ankle-brachial index is within normal range. No evidence of significant left lower extremity arterial disease. The left toe-brachial index is normal.  *See table(s) above for measurements and observations.  Electronically signed by Leotis Pain MD on 10/30/2020 at 10:09:09 AM.    Final        Assessment & Plan:   1. Atherosclerosis of native artery of right lower extremity with intermittent claudication (HCC) Recommend:  The patient is status post successful angiogram with intervention.  The patient reports that the claudication symptoms and leg pain is essentially gone.   The patient denies lifestyle limiting changes at this point in time.  No further invasive studies, angiography or surgery at this time The patient should continue walking and begin a more formal exercise program.  The patient should continue antiplatelet therapy and aggressive treatment of the lipid abnormalities   Patient will follow up in 3 months with noninvasive studies.   2. Primary hypertension Continue antihypertensive medications as already ordered, these medications have been reviewed and there are no changes at this time.   3. Hyperlipidemia, mixed Continue statin as ordered and reviewed, no changes at this time    Current Outpatient Medications on File Prior to Visit  Medication Sig Dispense Refill  . alfuzosin (UROXATRAL) 10 MG 24 hr tablet Take 1 tablet (10 mg total) by mouth daily with breakfast. 90 tablet 3  . aspirin EC 81 MG tablet Take 81 mg by mouth daily. Swallow whole.    . Azelastine-Fluticasone 137-50 MCG/ACT SUSP Place 1  spray into both nostrils 2 (two) times daily.    . calcium citrate (CALCITRATE - DOSED IN MG ELEMENTAL CALCIUM) 950 (200 Ca) MG tablet Take 1 tablet by mouth 2 (two) times daily.    . calcium citrate-vitamin D (CITRACAL+D) 315-200 MG-UNIT  tablet Take 1-2 tablets by mouth 2 (two) times daily.    . Cholecalciferol (VITAMIN D) 2000 UNITS CAPS Take 1 capsule by mouth daily.    . clindamycin (CLEOCIN T) 1 % external solution APPLY TO AFFECTED AREA TWICE DAILY AS NEEDED    . clopidogrel (PLAVIX) 75 MG tablet Take 1 tablet (75 mg total) by mouth daily. 30 tablet 11  . Coenzyme Q10 (Q-10 CO-ENZYME PO)     . Cyanocobalamin (VITAMIN B-12) 5000 MCG SUBL Place 1 tablet under the tongue daily.    . fexofenadine (ALLEGRA) 180 MG tablet Take 1 tablet by mouth daily as needed.    . finasteride (PROSCAR) 5 MG tablet Take 1 tablet (5 mg total) by mouth daily. 90 tablet 3  . icosapent Ethyl (VASCEPA) 1 g capsule Take 1 tablet by mouth 4 (four) times daily.    . meloxicam (MOBIC) 15 MG tablet Take by mouth.    Marland Kitchen MILK THISTLE PO Take 1 capsule by mouth daily.    . montelukast (SINGULAIR) 10 MG tablet Take by mouth.    . Multiple Vitamin (MULTIVITAMIN) tablet Take 1 tablet by mouth daily.    . rosuvastatin (CRESTOR) 40 MG tablet Take 40 mg by mouth daily.    . tadalafil (CIALIS) 5 MG tablet Take by mouth.    . traMADol (ULTRAM) 50 MG tablet Take by mouth.    . Vitamin E 180 MG (400 UNIT) CAPS Take by mouth.    . MEDROL 4 MG TBPK tablet Take      6 PILLS FIRST DAY, 5 PILLS SECOND DAY, 4 PILLS THIRD DAY, 3 PILLS FOURTH DAY, 2 PILLS FIFTH DAY, 1 PILL SIXTH DAY (Patient not taking: No sig reported)     No current facility-administered medications on file prior to visit.    There are no Patient Instructions on file for this visit. No follow-ups on file.   Kris Hartmann, NP

## 2021-01-27 ENCOUNTER — Other Ambulatory Visit (INDEPENDENT_AMBULATORY_CARE_PROVIDER_SITE_OTHER): Payer: Self-pay | Admitting: Vascular Surgery

## 2021-01-27 DIAGNOSIS — I739 Peripheral vascular disease, unspecified: Secondary | ICD-10-CM

## 2021-01-28 ENCOUNTER — Encounter (INDEPENDENT_AMBULATORY_CARE_PROVIDER_SITE_OTHER): Payer: Self-pay | Admitting: Nurse Practitioner

## 2021-01-28 ENCOUNTER — Ambulatory Visit (INDEPENDENT_AMBULATORY_CARE_PROVIDER_SITE_OTHER): Payer: 59

## 2021-01-28 ENCOUNTER — Other Ambulatory Visit: Payer: Self-pay

## 2021-01-28 ENCOUNTER — Ambulatory Visit (INDEPENDENT_AMBULATORY_CARE_PROVIDER_SITE_OTHER): Payer: 59 | Admitting: Nurse Practitioner

## 2021-01-28 VITALS — BP 150/88 | HR 63 | Resp 16 | Ht 66.0 in | Wt 192.0 lb

## 2021-01-28 DIAGNOSIS — I70211 Atherosclerosis of native arteries of extremities with intermittent claudication, right leg: Secondary | ICD-10-CM

## 2021-01-28 DIAGNOSIS — I10 Essential (primary) hypertension: Secondary | ICD-10-CM | POA: Diagnosis not present

## 2021-01-28 DIAGNOSIS — I739 Peripheral vascular disease, unspecified: Secondary | ICD-10-CM | POA: Diagnosis not present

## 2021-01-28 DIAGNOSIS — E782 Mixed hyperlipidemia: Secondary | ICD-10-CM

## 2021-01-28 DIAGNOSIS — I6529 Occlusion and stenosis of unspecified carotid artery: Secondary | ICD-10-CM | POA: Diagnosis not present

## 2021-01-31 ENCOUNTER — Encounter (INDEPENDENT_AMBULATORY_CARE_PROVIDER_SITE_OTHER): Payer: Self-pay | Admitting: Nurse Practitioner

## 2021-01-31 NOTE — Progress Notes (Signed)
Subjective:    Patient ID: Clayton Powell, male    DOB: Jul 12, 1946, 75 y.o.   MRN: [id number] Chief Complaint  Patient presents with   Follow-up    ultrasound    The patient returns to the office for followup and review of the noninvasive studies. There have been no interval changes in lower extremity symptoms. No interval shortening of the patient's claudication distance or development of rest pain symptoms. No new ulcers or wounds have occurred since the last visit.  There have been no significant changes to the patient's overall health care.  The patient denies amaurosis fugax or recent TIA symptoms. There are no recent neurological changes noted. The patient denies history of DVT, PE or superficial thrombophlebitis. The patient denies recent episodes of angina or shortness of breath.   ABI Rt=1.27 and Lt=1.23  (previous ABI's Rt=1.27 and Lt=1.25) Duplex ultrasound of the bilateral tibial arteries reveals biphasic/triphasic waveforms with good toe waveforms.  The right lower extremity has triphasic waveforms throughout with some biphasic waveforms in the distal anterior tibial artery.  The previously placed stents are patent.   Review of Systems     Objective:   Physical Exam  BP (!) 150/88 (BP Location: Right Arm)   Pulse 63   Resp 16   Ht 5\' 6"  (1.676 m)   Wt 192 lb (87.1 kg)   BMI 30.99 kg/m   Past Medical History:  Diagnosis Date   Allergy    Anginal pain (HCC)    BPH (benign prostatic hypertrophy) 6/07   CAD (coronary artery disease) 2010   dx made by coronary calcium scoring, Normal stress test in 2010.    GERD (gastroesophageal reflux disease) 12/05   HLD (hyperlipidemia) 3/99   HTN (hypertension) 2002   no meds   Left rotator cuff tear 08/12/2011   Per Dr. Mardelle Matte 2013 L rotator cuff tear on MRI 2013    Right rotator cuff tear 05/24/2013   Wears glasses     Social History   Socioeconomic History   Marital status: Married    Spouse name: Not on  file   Number of children: Not on file   Years of education: Not on file   Highest education level: Not on file  Occupational History   Not on file  Tobacco Use   Smoking status: Never   Smokeless tobacco: Never  Substance and Sexual Activity   Alcohol use: Yes    Alcohol/week: 0.0 standard drinks    Comment: beer, occ   Drug use: No   Sexual activity: Yes  Other Topics Concern   Not on file  Social History Narrative   Married, 2 children - one is local   Married 40+ years   [employer]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    Plays guitar   Works out regularly.   Social Determinants of Health   Financial Resource Strain: Not on file  Food Insecurity: Not on file  Transportation Needs: Not on file  Physical Activity: Not on file  Stress: Not on file  Social Connections: Not on file  Intimate Partner Violence: Not on file    Past Surgical History:  Procedure Laterality Date   admitted to [facility] chest pain  12/15/05   Dr. Clayborn Bigness   BLEPHAROPLASTY  10/11   Dr. Loletta Specter    bone spur removed Right    COLONOSCOPY  09/24/04   nml; Dr. Epifanio Lesches    CORONARY ARTERY BYPASS GRAFT N/A 05/29/2020   Procedure: CORONARY ARTERY  BYPASS GRAFTING (CABG) TIMES THREE USING LIMA to LAD; ENDOSCOPIC HARVESTED RIGHT GREATER SAPHENOUS VEIN: SVG to PD; SVG to RAMUS.;  Surgeon: Grace Isaac, MD;  Location: [facility];  Service: Open Heart Surgery;  Laterality: N/A;   EGD esophagitis by bx  01/26/06   gastritis bx neg h-pylori   ENDOVEIN HARVEST OF GREATER SAPHENOUS VEIN Right 05/29/2020   Procedure: ENDOVEIN HARVEST OF GREATER SAPHENOUS VEIN;  Surgeon: Grace Isaac, MD;  Location: [facility];  Service: Open Heart Surgery;  Laterality: Right;   LEFT HEART CATH AND CORONARY ANGIOGRAPHY N/A 05/26/2020   Procedure: LEFT HEART CATH AND CORONARY ANGIOGRAPHY;  Surgeon: Corey Skains, MD;  Location: [facility];  Service: Cardiovascular;  Laterality: N/A;   LOWER EXTREMITY ANGIOGRAPHY Right 10/01/2020    Procedure: LOWER EXTREMITY ANGIOGRAPHY;  Surgeon: Algernon Huxley, MD;  Location: [facility];  Service: Cardiovascular;  Laterality: Right;   POLYPECTOMY     right and left prostate needle bx  05/02/03   acute inflammation; Dr. Gaynelle Arabian    SHOULDER ARTHROSCOPY W/ ROTATOR CUFF REPAIR  12/2011   left   SHOULDER ARTHROSCOPY WITH ROTATOR CUFF REPAIR AND SUBACROMIAL DECOMPRESSION Right 05/24/2013   Procedure: RIGHT SHOULDER ARTHROSCOPY WITH ROTATOR CUFF REPAIR AND SUBACROMIAL DECOMPRESSION AND PARTIAL ACROMIOPLSTY WITH CORACOACROMIAL RELEASE, DISTAL CLAVICULECTOMY, LABRIAL DEBRIDEMENT;  Surgeon: Johnny Bridge, MD;  Location: [facility];  Service: Orthopedics;  Laterality: Right;   spect ETT nml  12/16/05   TEE WITHOUT CARDIOVERSION N/A 05/29/2020   Procedure: TRANSESOPHAGEAL ECHOCARDIOGRAM (TEE);  Surgeon: Grace Isaac, MD;  Location: [facility];  Service: Open Heart Surgery;  Laterality: N/A;   UPPER GASTROINTESTINAL ENDOSCOPY     WISDOM TOOTH EXTRACTION      Family History  Problem Relation Age of Onset   Stroke Mother    Glaucoma Other        grandmother at 32   Barrett's esophagus Maternal Uncle    Prostate cancer Neg Hx    Colon cancer Neg Hx    Colon polyps Neg Hx    Esophageal cancer Neg Hx    Rectal cancer Neg Hx    Stomach cancer Neg Hx     Allergies  Allergen Reactions   Ciprofloxacin Hcl Rash   Ciprofloxacin Hcl Rash   Quinolones Rash    CBC Latest Ref Rng & Units 06/01/2020 05/31/2020 05/30/2020  WBC 4.0 - 10.5 K/uL 9.5 9.5 10.2  Hemoglobin 13.0 - 17.0 g/dL 10.8(L) 10.1(L) 11.5(L)  Hematocrit 39.0 - 52.0 % 32.9(L) 31.4(L) 35.3(L)  Platelets 150 - 400 K/uL 141(L) 117(L) 132(L)      CMP     Component Value Date/Time   NA 140 06/02/2020 0824   NA 140 05/12/2012 1240   K 3.9 06/02/2020 0824   K 4.2 05/12/2012 1240   CL 106 06/02/2020 0824   CL 107 05/12/2012 1240   CO2 25 06/02/2020 0824   CO2 26 05/12/2012 1240   GLUCOSE 102 (H)  06/02/2020 0824   GLUCOSE 101 (H) 05/12/2012 1240   BUN 19 10/01/2020 0819   BUN 21 (H) 05/12/2012 1240   CREATININE 0.90 10/01/2020 0819   CREATININE 0.86 05/12/2012 1240   CALCIUM 8.3 (L) 06/02/2020 0824   CALCIUM 8.9 05/12/2012 1240   PROT 7.1 05/25/2020 1715   PROT 7.4 01/17/2012 0807   ALBUMIN 4.4 05/25/2020 1715   ALBUMIN 3.9 01/17/2012 0807   AST 34 05/25/2020 1715   AST 57 (H) 01/17/2012 1610  ALT 29 05/25/2020 1715   ALT 49 01/17/2012 0807   ALKPHOS 73 05/25/2020 1715   ALKPHOS 74 01/17/2012 0807   BILITOT 0.8 05/25/2020 1715   BILITOT 0.8 01/17/2012 0807   GFRNONAA >60 10/01/2020 0819   GFRNONAA >60 05/12/2012 1240   GFRAA >60 05/12/2012 1240     VAS [place] ABI WITH/WO TBI  Result Date: 01/29/2021  LOWER EXTREMITY DOPPLER STUDY Patient Name:  Clayton Powell  Date of Exam:   01/28/2021 Medical Rec #: [id number]         Accession #:    [id number] Date of Birth: [birth year]        Patient Gender: M Patient Age:   51Y Exam Location:  [facility] Procedure:      VAS [place] ABI WITH/WO TBI Referring Phys: [id number] [place] --------------------------------------------------------------------------------  Indications: Peripheral artery disease.  Vascular Interventions: 10/01/20: Right SFA/popliteal stent x2 with right TP                         trunk & proximal posterior tibial artery angioplasties;Marland Kitchen Performing Technologist: Blondell Reveal RT, RDMS, RVT  Examination Guidelines: A complete evaluation includes at minimum, Doppler waveform signals and systolic blood pressure reading at the level of bilateral brachial, anterior tibial, and posterior tibial arteries, when vessel segments are accessible. Bilateral testing is considered an integral part of a complete examination. Photoelectric Plethysmograph (PPG) waveforms and toe systolic pressure readings are included as required and additional duplex testing as needed. Limited examinations for reoccurring indications may be performed  as noted.  ABI Findings: +---------+------------------+-----+---------+--------+ Right    Rt Pressure (mmHg)IndexWaveform Comment  +---------+------------------+-----+---------+--------+ Brachial 158                                      +---------+------------------+-----+---------+--------+ ATA      182               1.15 biphasic          +---------+------------------+-----+---------+--------+ PTA      201               1.27 triphasic         +---------+------------------+-----+---------+--------+ Great Toe120               0.76 Normal            +---------+------------------+-----+---------+--------+ +---------+------------------+-----+---------+-------+ Left     Lt Pressure (mmHg)IndexWaveform Comment +---------+------------------+-----+---------+-------+ Brachial 153                                     +---------+------------------+-----+---------+-------+ ATA      182               1.15 biphasic         +---------+------------------+-----+---------+-------+ PTA      195               1.23 triphasic        +---------+------------------+-----+---------+-------+ Great Toe142               0.90 Normal           +---------+------------------+-----+---------+-------+ +-------+-----------+-----------+------------+------------+ ABI/TBIToday's ABIToday's TBIPrevious ABIPrevious TBI +-------+-----------+-----------+------------+------------+ Right  1.27       0.76       1.27        1.24         +-------+-----------+-----------+------------+------------+  Left   1.23       0.90       1.25        1.42         +-------+-----------+-----------+------------+------------+ Bilateral ABIs appear essentially unchanged compared to prior study on 10/29/20.  Summary: Right: Resting right ankle-brachial index is within normal range. No evidence of significant right lower extremity arterial disease. The right toe-brachial index is normal. Left: Resting left  ankle-brachial index is within normal range. No evidence of significant left lower extremity arterial disease. The left toe-brachial index is normal.  *See table(s) above for measurements and observations.  Electronically signed by Leotis Pain MD on 01/29/2021 at 8:38:10 AM.    Final        Assessment & Plan:   1. Atherosclerosis of native artery of right lower extremity with intermittent claudication (HCC)  Recommend:  The patient has evidence of atherosclerosis of the lower extremities.  The patient does not voice lifestyle limiting changes at this point in time.  Noninvasive studies do not suggest clinically significant change.  No invasive studies, angiography or surgery at this time The patient should continue walking and begin a more formal exercise program.  The patient should continue antiplatelet therapy and aggressive treatment of the lipid abnormalities  No changes in the patient's medications at this time  The patient should continue wearing graduated compression socks 10-15 mmHg strength to control the mild edema.     Patient to follow-up in 6 months with noninvasive studies. 2. Primary hypertension Continue antihypertensive medications as already ordered, these medications have been reviewed and there are no changes at this time.   3. Hyperlipidemia, mixed Continue statin as ordered and reviewed, no changes at this time   4. Stenosis of carotid artery, unspecified laterality The patient has concerns for carotid disease given his history of atherosclerotic events..  In reviewing records I am unable to find a ultrasound of the abdomen present however given the patient's extensive atherosclerotic history it is prudent to evaluate his carotid arteries.  We will have the patient return at his convenience for evaluation.  Current Outpatient Medications on File Prior to Visit  Medication Sig Dispense Refill   alfuzosin (UROXATRAL) 10 MG 24 hr tablet Take 1 tablet (10 mg total) by  mouth daily with breakfast. 90 tablet 3   aspirin EC 81 MG tablet Take 81 mg by mouth daily. Swallow whole.     Azelastine-Fluticasone 137-50 MCG/ACT SUSP Place 1 spray into both nostrils 2 (two) times daily.     calcium citrate (CALCITRATE - DOSED IN MG ELEMENTAL CALCIUM) 950 (200 Ca) MG tablet Take 1 tablet by mouth 2 (two) times daily.     calcium citrate-vitamin D (CITRACAL+D) 315-200 MG-UNIT tablet Take 1-2 tablets by mouth 2 (two) times daily.     Cholecalciferol (VITAMIN D) 2000 UNITS CAPS Take 1 capsule by mouth daily.     clopidogrel (PLAVIX) 75 MG tablet Take 1 tablet (75 mg total) by mouth daily. 30 tablet 11   Coenzyme Q10 (Q-10 CO-ENZYME PO)      Cyanocobalamin (VITAMIN B-12) 5000 MCG SUBL Place 1 tablet under the tongue daily.     fexofenadine (ALLEGRA) 180 MG tablet Take 1 tablet by mouth daily as needed.     finasteride (PROSCAR) 5 MG tablet Take 1 tablet (5 mg total) by mouth daily. 90 tablet 3   icosapent Ethyl (VASCEPA) 1 g capsule Take 1 tablet by mouth 4 (four) times daily.     meloxicam (  MOBIC) 15 MG tablet Take by mouth.     MILK THISTLE PO Take 1 capsule by mouth daily.     montelukast (SINGULAIR) 10 MG tablet Take by mouth.     Multiple Vitamin (MULTIVITAMIN) tablet Take 1 tablet by mouth daily.     rosuvastatin (CRESTOR) 40 MG tablet Take 40 mg by mouth daily.     tadalafil (CIALIS) 5 MG tablet Take by mouth.     Vitamin E 180 MG (400 UNIT) CAPS Take by mouth.     clindamycin (CLEOCIN T) 1 % external solution APPLY TO AFFECTED AREA TWICE DAILY AS NEEDED (Patient not taking: Reported on 01/28/2021)     MEDROL 4 MG TBPK tablet Take      6 PILLS FIRST DAY, 5 PILLS SECOND DAY, 4 PILLS THIRD DAY, 3 PILLS FOURTH DAY, 2 PILLS FIFTH DAY, 1 PILL SIXTH DAY (Patient not taking: No sig reported)     traMADol (ULTRAM) 50 MG tablet Take by mouth.     No current facility-administered medications on file prior to visit.    There are no Patient Instructions on file for this  visit. No follow-ups on file.   Kris Hartmann, NP

## 2021-02-02 ENCOUNTER — Telehealth: Payer: 59 | Admitting: Urology

## 2021-02-02 ENCOUNTER — Other Ambulatory Visit: Payer: Self-pay

## 2021-02-02 DIAGNOSIS — N401 Enlarged prostate with lower urinary tract symptoms: Secondary | ICD-10-CM | POA: Diagnosis not present

## 2021-02-02 DIAGNOSIS — N138 Other obstructive and reflux uropathy: Secondary | ICD-10-CM | POA: Diagnosis not present

## 2021-02-02 NOTE — Progress Notes (Addendum)
Virtual Visit via video note  I connected with Clayton INGRUM on 02/02/21 at 11:30 AM EDT by video and verified that I am speaking with the correct person using two identifiers.   Patient location: Home Provider location: [facility]   I discussed the limitations, risks, security and privacy concerns of performing an evaluation and management service by telephone and the availability of in person appointments. We discussed the impact of the COVID-19 pandemic on the healthcare system, and the importance of social distancing and reducing patient and provider exposure. I also discussed with the patient that there may be a patient responsible charge related to this service. The patient expressed understanding and agreed to proceed.  Reason for visit: BPH  History of Present Illness: 75 year old male with long history of BPH and bothersome urinary symptoms of weak stream, feeling of incomplete emptying, nocturia.  He is on maximal medical therapy with alfuzosin, finasteride, and Cialis.  He has undergone work-up previously with cystoscopy and TRUS that showed a large prostate with obstructing lateral lobes and measured 120 g.  He works at [employer].  He underwent CABG in December 2021 in [place], and hospitalization was complicated by clot retention/bladder spasms with catheter in place.  He remains very resistant to HOLEP secondary to concerns about retrograde ejaculation and possible incontinence.  We reviewed the AUA guidelines on BPH extensively including recommendation is HOLEP for the gold standard treatment and large prostates over 100 g.  We discussed the risks of rezum, UroLift, and PAE.  He would like to get a second opinion regarding the possibility of resume, and has an upcoming appointment with Dr. Junious Silk in [place].  Follow Up: Meeting with Dr. Junious Silk in [place] to discuss Rezum Follow-up with me in end of September as scheduled   I discussed the  assessment and treatment plan with the patient. The patient was provided an opportunity to ask questions and all were answered. The patient agreed with the plan and demonstrated an understanding of the instructions.   The patient was advised to call back or seek an in-person evaluation if the symptoms worsen or if the condition fails to improve as anticipated.  I provided 9 minutes of non-face-to-face time during this encounter.   Billey Co, MD

## 2021-02-09 ENCOUNTER — Other Ambulatory Visit (INDEPENDENT_AMBULATORY_CARE_PROVIDER_SITE_OTHER): Payer: Self-pay | Admitting: Nurse Practitioner

## 2021-02-09 DIAGNOSIS — I6529 Occlusion and stenosis of unspecified carotid artery: Secondary | ICD-10-CM

## 2021-02-11 ENCOUNTER — Other Ambulatory Visit: Payer: Self-pay

## 2021-02-11 ENCOUNTER — Ambulatory Visit (INDEPENDENT_AMBULATORY_CARE_PROVIDER_SITE_OTHER): Payer: 59

## 2021-02-11 ENCOUNTER — Ambulatory Visit (INDEPENDENT_AMBULATORY_CARE_PROVIDER_SITE_OTHER): Payer: 59 | Admitting: Nurse Practitioner

## 2021-02-11 ENCOUNTER — Encounter (INDEPENDENT_AMBULATORY_CARE_PROVIDER_SITE_OTHER): Payer: Self-pay | Admitting: Nurse Practitioner

## 2021-02-11 VITALS — BP 128/76 | HR 62 | Resp 16 | Wt 194.5 lb

## 2021-02-11 DIAGNOSIS — I70211 Atherosclerosis of native arteries of extremities with intermittent claudication, right leg: Secondary | ICD-10-CM | POA: Diagnosis not present

## 2021-02-11 DIAGNOSIS — I10 Essential (primary) hypertension: Secondary | ICD-10-CM

## 2021-02-11 DIAGNOSIS — I6529 Occlusion and stenosis of unspecified carotid artery: Secondary | ICD-10-CM | POA: Diagnosis not present

## 2021-02-11 NOTE — Progress Notes (Signed)
Subjective:    Patient ID: Clayton Powell, male    DOB: 1945-12-31, 75 y.o.   MRN: [id number] Chief Complaint  Patient presents with   Follow-up    Ultrasound follow up    Clayton Powell is a 75 year old male that presents today for follow-up evaluation of his carotid arteries.  The patient was concern for possible carotid artery stenosis due to his history of PAD as well as CAD.  He denies any signs and symptoms associated with carotid stenosis such as TIA or CVA-like symptoms.  The patient recently underwent noninvasive studies for his PAD and there are no significant changes there.  Today noninvasive studies show 1 to 39% stenosis in the right ICA with no stenosis noted in the left.  The patient has normal flow hemodynamics in the bilateral vertebral arteries as well as antegrade flow.  Normal flow hemodynamics are also seen in the bilateral subclavian arteries.   Review of Systems  Musculoskeletal:  Positive for myalgias.  All other systems reviewed and are negative.     Objective:   Physical Exam Vitals reviewed.  HENT:     Head: Normocephalic.  Cardiovascular:     Rate and Rhythm: Normal rate.  Pulmonary:     Effort: Pulmonary effort is normal.  Neurological:     Mental Status: He is alert and oriented to person, place, and time.  Psychiatric:        Mood and Affect: Mood normal.        Behavior: Behavior normal.        Thought Content: Thought content normal.        Judgment: Judgment normal.    BP 128/76 (BP Location: Left Arm)   Pulse 62   Resp 16   Wt 194 lb 8 oz (88.2 kg)   BMI 31.39 kg/m   Past Medical History:  Diagnosis Date   Allergy    Anginal pain (HCC)    BPH (benign prostatic hypertrophy) 6/07   CAD (coronary artery disease) 2010   dx made by coronary calcium scoring, Normal stress test in 2010.    GERD (gastroesophageal reflux disease) 12/05   HLD (hyperlipidemia) 3/99   HTN (hypertension) 2002   no meds   Left rotator cuff tear  08/12/2011   Per Dr. Mardelle Matte 2013 L rotator cuff tear on MRI 2013    Right rotator cuff tear 05/24/2013   Wears glasses     Social History   Socioeconomic History   Marital status: Married    Spouse name: Not on file   Number of children: Not on file   Years of education: Not on file   Highest education level: Not on file  Occupational History   Not on file  Tobacco Use   Smoking status: Never   Smokeless tobacco: Never  Substance and Sexual Activity   Alcohol use: Yes    Alcohol/week: 0.0 standard drinks    Comment: beer, occ   Drug use: No   Sexual activity: Yes  Other Topics Concern   Not on file  Social History Narrative   Married, 2 children - one is local   Married 40+ years   [place]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    Plays guitar   Works out regularly.   Social Determinants of Health   Financial Resource Strain: Not on file  Food Insecurity: Not on file  Transportation Needs: Not on file  Physical Activity: Not on file  Stress: Not on file  Social  Connections: Not on file  Intimate Partner Violence: Not on file    Past Surgical History:  Procedure Laterality Date   admitted to [facility] chest pain  12/15/05   Dr. Clayborn Bigness   BLEPHAROPLASTY  10/11   Dr. Loletta Specter    bone spur removed Right    COLONOSCOPY  09/24/04   nml; Dr. Epifanio Lesches    CORONARY ARTERY BYPASS GRAFT N/A 05/29/2020   Procedure: CORONARY ARTERY BYPASS GRAFTING (CABG) TIMES THREE USING LIMA to LAD; ENDOSCOPIC HARVESTED RIGHT GREATER SAPHENOUS VEIN: SVG to PD; SVG to RAMUS.;  Surgeon: Grace Isaac, MD;  Location: [place];  Service: Open Heart Surgery;  Laterality: N/A;   EGD esophagitis by bx  01/26/06   gastritis bx neg h-pylori   ENDOVEIN HARVEST OF GREATER SAPHENOUS VEIN Right 05/29/2020   Procedure: ENDOVEIN HARVEST OF GREATER SAPHENOUS VEIN;  Surgeon: Grace Isaac, MD;  Location: [place];  Service: Open Heart Surgery;  Laterality: Right;   LEFT HEART CATH AND CORONARY ANGIOGRAPHY N/A  05/26/2020   Procedure: LEFT HEART CATH AND CORONARY ANGIOGRAPHY;  Surgeon: Corey Skains, MD;  Location: [place] [facility];  Service: Cardiovascular;  Laterality: N/A;   LOWER EXTREMITY ANGIOGRAPHY Right 10/01/2020   Procedure: LOWER EXTREMITY ANGIOGRAPHY;  Surgeon: Algernon Huxley, MD;  Location: [place] [facility];  Service: Cardiovascular;  Laterality: Right;   POLYPECTOMY     right and left prostate needle bx  05/02/03   acute inflammation; Dr. Gaynelle Arabian    SHOULDER ARTHROSCOPY W/ ROTATOR CUFF REPAIR  12/2011   left   SHOULDER ARTHROSCOPY WITH ROTATOR CUFF REPAIR AND SUBACROMIAL DECOMPRESSION Right 05/24/2013   Procedure: RIGHT SHOULDER ARTHROSCOPY WITH ROTATOR CUFF REPAIR AND SUBACROMIAL DECOMPRESSION AND PARTIAL ACROMIOPLSTY WITH CORACOACROMIAL RELEASE, DISTAL CLAVICULECTOMY, LABRIAL DEBRIDEMENT;  Surgeon: Johnny Bridge, MD;  Location: [place];  Service: Orthopedics;  Laterality: Right;   spect ETT nml  12/16/05   TEE WITHOUT CARDIOVERSION N/A 05/29/2020   Procedure: TRANSESOPHAGEAL ECHOCARDIOGRAM (TEE);  Surgeon: Grace Isaac, MD;  Location: [place];  Service: Open Heart Surgery;  Laterality: N/A;   UPPER GASTROINTESTINAL ENDOSCOPY     WISDOM TOOTH EXTRACTION      Family History  Problem Relation Age of Onset   Stroke Mother    Glaucoma Other        grandmother at 58   Barrett's esophagus Maternal Uncle    Prostate cancer Neg Hx    Colon cancer Neg Hx    Colon polyps Neg Hx    Esophageal cancer Neg Hx    Rectal cancer Neg Hx    Stomach cancer Neg Hx     Allergies  Allergen Reactions   Ciprofloxacin Hcl Rash   Ciprofloxacin Hcl Rash   Quinolones Rash    CBC Latest Ref Rng & Units 06/01/2020 05/31/2020 05/30/2020  WBC 4.0 - 10.5 K/uL 9.5 9.5 10.2  Hemoglobin 13.0 - 17.0 g/dL 10.8(L) 10.1(L) 11.5(L)  Hematocrit 39.0 - 52.0 % 32.9(L) 31.4(L) 35.3(L)  Platelets 150 - 400 K/uL 141(L) 117(L) 132(L)      CMP     Component Value Date/Time   NA  140 06/02/2020 0824   NA 140 05/12/2012 1240   K 3.9 06/02/2020 0824   K 4.2 05/12/2012 1240   CL 106 06/02/2020 0824   CL 107 05/12/2012 1240   CO2 25 06/02/2020 0824   CO2 26 05/12/2012 1240   GLUCOSE 102 (H) 06/02/2020 0824   GLUCOSE 101 (H) 05/12/2012 1240  BUN 19 10/01/2020 0819   BUN 21 (H) 05/12/2012 1240   CREATININE 0.90 10/01/2020 0819   CREATININE 0.86 05/12/2012 1240   CALCIUM 8.3 (L) 06/02/2020 0824   CALCIUM 8.9 05/12/2012 1240   PROT 7.1 05/25/2020 1715   PROT 7.4 01/17/2012 0807   ALBUMIN 4.4 05/25/2020 1715   ALBUMIN 3.9 01/17/2012 0807   AST 34 05/25/2020 1715   AST 57 (H) 01/17/2012 0807   ALT 29 05/25/2020 1715   ALT 49 01/17/2012 0807   ALKPHOS 73 05/25/2020 1715   ALKPHOS 74 01/17/2012 0807   BILITOT 0.8 05/25/2020 1715   BILITOT 0.8 01/17/2012 0807   GFRNONAA >60 10/01/2020 0819   GFRNONAA >60 05/12/2012 1240   GFRAA >60 05/12/2012 1240     VAS [place] ABI WITH/WO TBI  Result Date: 01/29/2021  LOWER EXTREMITY DOPPLER STUDY Patient Name:  RIDDIK SENNA  Date of Exam:   01/28/2021 Medical Rec #: [id number]         Accession #:    [id number] Date of Birth: 1946/07/19        Patient Gender: M Patient Age:   46Y Exam Location:  [facility] Procedure:      VAS [place] ABI WITH/WO TBI Referring Phys: [id number] [place] --------------------------------------------------------------------------------  Indications: Peripheral artery disease.  Vascular Interventions: 10/01/20: Right SFA/popliteal stent x2 with right TP                         trunk & proximal posterior tibial artery angioplasties;Marland Kitchen Performing Technologist: Blondell Reveal RT, RDMS, RVT  Examination Guidelines: A complete evaluation includes at minimum, Doppler waveform signals and systolic blood pressure reading at the level of bilateral brachial, anterior tibial, and posterior tibial arteries, when vessel segments are accessible. Bilateral testing is considered an integral part of a complete  examination. Photoelectric Plethysmograph (PPG) waveforms and toe systolic pressure readings are included as required and additional duplex testing as needed. Limited examinations for reoccurring indications may be performed as noted.  ABI Findings: +---------+------------------+-----+---------+--------+ Right    Rt Pressure (mmHg)IndexWaveform Comment  +---------+------------------+-----+---------+--------+ Brachial 158                                      +---------+------------------+-----+---------+--------+ ATA      182               1.15 biphasic          +---------+------------------+-----+---------+--------+ PTA      201               1.27 triphasic         +---------+------------------+-----+---------+--------+ Great Toe120               0.76 Normal            +---------+------------------+-----+---------+--------+ +---------+------------------+-----+---------+-------+ Left     Lt Pressure (mmHg)IndexWaveform Comment +---------+------------------+-----+---------+-------+ Brachial 153                                     +---------+------------------+-----+---------+-------+ ATA      182               1.15 biphasic         +---------+------------------+-----+---------+-------+ PTA      195  1.23 triphasic        +---------+------------------+-----+---------+-------+ Great Toe142               0.90 Normal           +---------+------------------+-----+---------+-------+ +-------+-----------+-----------+------------+------------+ ABI/TBIToday's ABIToday's TBIPrevious ABIPrevious TBI +-------+-----------+-----------+------------+------------+ Right  1.27       0.76       1.27        1.24         +-------+-----------+-----------+------------+------------+ Left   1.23       0.90       1.25        1.42         +-------+-----------+-----------+------------+------------+ Bilateral ABIs appear essentially unchanged compared to  prior study on 10/29/20.  Summary: Right: Resting right ankle-brachial index is within normal range. No evidence of significant right lower extremity arterial disease. The right toe-brachial index is normal. Left: Resting left ankle-brachial index is within normal range. No evidence of significant left lower extremity arterial disease. The left toe-brachial index is normal.  *See table(s) above for measurements and observations.  Electronically signed by Leotis Pain MD on 01/29/2021 at 8:38:10 AM.    Final        Assessment & Plan:   1. Stenosis of carotid artery, unspecified laterality Recommend:  Given the patient's asymptomatic subcritical stenosis no further invasive testing or surgery at this time.  Duplex ultrasound shows <20% stenosis bilaterally.  Continue antiplatelet therapy as prescribed Continue management of CAD, HTN and Hyperlipidemia Healthy heart diet,  encouraged exercise at least 4 times per week Follow up in 24 months with duplex ultrasound and physical exam    2. Atherosclerosis of native artery of right lower extremity with intermittent claudication [facility]) Patient will continue to maintain his follow-up in 6 months with noninvasive studies.  He still continues to have pain along the right medial edge of his lower extremity.  However this is not accompanied by claudication or any of the usual PAD symptoms, his recent noninvasive studies are mostly normal but he did have some biphasic waveforms in the distal anterior tibial artery, all other waveforms are triphasic.  We discussed that this pain is likely more so related to a musculoskeletal cause however if after further work-up and evaluation no cause is found, we can always reevaluate using either a CT angiogram or a diagnostic angiogram to look and evaluate further.  3. Primary hypertension Continue antihypertensive medications as already ordered, these medications have been reviewed and there are no changes at this time.     Current Outpatient Medications on File Prior to Visit  Medication Sig Dispense Refill   alfuzosin (UROXATRAL) 10 MG 24 hr tablet Take 1 tablet (10 mg total) by mouth daily with breakfast. 90 tablet 3   aspirin EC 81 MG tablet Take 81 mg by mouth daily. Swallow whole.     Azelastine-Fluticasone 137-50 MCG/ACT SUSP Place 1 spray into both nostrils 2 (two) times daily.     calcium citrate (CALCITRATE - DOSED IN MG ELEMENTAL CALCIUM) 950 (200 Ca) MG tablet Take 1 tablet by mouth 2 (two) times daily.     calcium citrate-vitamin D (CITRACAL+D) 315-200 MG-UNIT tablet Take 1-2 tablets by mouth 2 (two) times daily.     Cholecalciferol (VITAMIN D) 2000 UNITS CAPS Take 1 capsule by mouth daily.     Coenzyme Q10 (Q-10 CO-ENZYME PO)      Cyanocobalamin (VITAMIN B-12) 5000 MCG SUBL Place 1 tablet under the tongue daily.     fexofenadine (  ALLEGRA) 180 MG tablet Take 1 tablet by mouth daily as needed.     finasteride (PROSCAR) 5 MG tablet Take 1 tablet (5 mg total) by mouth daily. 90 tablet 3   icosapent Ethyl (VASCEPA) 1 g capsule Take 1 tablet by mouth 4 (four) times daily.     meloxicam (MOBIC) 15 MG tablet Take by mouth.     MILK THISTLE PO Take 1 capsule by mouth daily.     montelukast (SINGULAIR) 10 MG tablet Take by mouth.     Multiple Vitamin (MULTIVITAMIN) tablet Take 1 tablet by mouth daily.     rosuvastatin (CRESTOR) 40 MG tablet Take 40 mg by mouth daily.     tadalafil (CIALIS) 5 MG tablet Take by mouth.     Vitamin E 180 MG (400 UNIT) CAPS Take by mouth.     clindamycin (CLEOCIN T) 1 % external solution APPLY TO AFFECTED AREA TWICE DAILY AS NEEDED (Patient not taking: No sig reported)     clopidogrel (PLAVIX) 75 MG tablet Take 1 tablet (75 mg total) by mouth daily. 30 tablet 11   MEDROL 4 MG TBPK tablet Take      6 PILLS FIRST DAY, 5 PILLS SECOND DAY, 4 PILLS THIRD DAY, 3 PILLS FOURTH DAY, 2 PILLS FIFTH DAY, 1 PILL SIXTH DAY (Patient not taking: No sig reported)     traMADol (ULTRAM) 50 MG  tablet Take by mouth.     No current facility-administered medications on file prior to visit.    There are no Patient Instructions on file for this visit. No follow-ups on file.   Kris Hartmann, NP

## 2021-03-16 ENCOUNTER — Telehealth: Payer: Self-pay | Admitting: *Deleted

## 2021-03-16 NOTE — Telephone Encounter (Signed)
Patient left a voicemail stating that he went to Dr.Dew for the cramping in his leg and had two stents put in. Patient stated that he is still having cramping in his leg in a different spot. Patient stated that Dr. Lucky Cowboy did an ultrasound and he does not have a clot. Patient stated that the cramping is in his calf now. Patient stated that he is going to see a chiropractor not knowing if this will help. Patient wants to know what Dr. Damita Dunnings thinks he should do next.

## 2021-03-17 NOTE — Telephone Encounter (Signed)
LMTCB

## 2021-03-17 NOTE — Telephone Encounter (Signed)
If he has exertional pain, ie claudication, then he needs to call Dr. Lucky Cowboy.  If his pain persists otherwise then I think we should recheck him in the clinic.  It is hard to give specific advice otherwise without examining the patient.  Thanks.

## 2021-03-18 NOTE — Telephone Encounter (Signed)
Called patient and discuss below with patient. He wanted to schedule appt with Dr. Damita Dunnings to be checked out. Appt made for 03/23/21 at 3:00 pm

## 2021-03-22 ENCOUNTER — Telehealth (INDEPENDENT_AMBULATORY_CARE_PROVIDER_SITE_OTHER): Payer: 59 | Admitting: Family Medicine

## 2021-03-22 ENCOUNTER — Encounter: Payer: Self-pay | Admitting: Family Medicine

## 2021-03-22 VITALS — BP 126/59 | Temp 100.2°F | Ht 66.0 in

## 2021-03-22 DIAGNOSIS — U071 COVID-19: Secondary | ICD-10-CM | POA: Diagnosis not present

## 2021-03-22 MED ORDER — MOLNUPIRAVIR EUA 200MG CAPSULE: 4.0000 | ORAL_CAPSULE | Freq: Two times a day (BID) | ORAL | 0 refills | Status: AC

## 2021-03-22 NOTE — Progress Notes (Signed)
      Wilmore Holsomback T. Avonna Iribe, MD Primary Care and Sports Medicine [facility] at [facility] [place], [address] Phone: [phone]  FAX: [place] - 75 y.o. male  MRN [id number]  Date of Birth: [birth year]  Visit Date: 03/22/2021  PCP: Tonia Ghent, MD  Referred by: Tonia Ghent, MD  Virtual Visit via Video Note:  I connected with  Clayton Powell on 03/22/2021  9:40 AM EDT by a video enabled telemedicine application and verified that I am speaking with the correct person using two identifiers.   Location patient: home computer, tablet, or smartphone Location provider: work or home office Consent: Verbal consent directly obtained from [name]. Persons participating in the virtual visit: patient, provider  I discussed the limitations of evaluation and management by telemedicine and the availability of in person appointments. The patient expressed understanding and agreed to proceed.  Interactive audio and video telecommunications were attempted between this provider and patient, however failed, due to patient having technical difficulties OR patient did not have access to video capability.  We continued and completed visit with audio only.    Chief Complaint  Patient presents with   Covid Positive    Positive home test today   Fever   Cough   Fatigue    History of Present Illness:  Has covid Went to [place] in [place] Sunday, started to feel bad Wife has covid, too Coughing and achy 100.2 temp Some fogginess No GI No neuro sx  Took some dayquil, nyquil   Review of Systems as above: See pertinent positives and pertinent negatives per HPI No acute distress verbally   Observations/Objective/Exam:  An attempt was made to discern vital signs over the phone and per patient if applicable and possible.   General:    Alert, Oriented, appears well and in no acute distress  Pulmonary:     On  inspection no signs of respiratory distress.  Psych / Neurological:     Pleasant and cooperative.  Assessment and Plan:    ICD-10-CM   1. COVID-19  U07.1      Total encounter time: 10 minutes. This includes total time spent on the day of encounter.   Classic covid sx.  Isolate 5 d, then 5 days additional with mask.  Risk factors, add antivirals  Questions answered.  I discussed the assessment and treatment plan with the patient. The patient was provided an opportunity to ask questions and all were answered. The patient agreed with the plan and demonstrated an understanding of the instructions.   The patient was advised to call back or seek an in-person evaluation if the symptoms worsen or if the condition fails to improve as anticipated.  Follow-up: prn unless noted otherwise below No follow-ups on file.  Meds ordered this encounter  Medications   molnupiravir EUA 200 mg CAPS    Sig: Take 4 capsules (800 mg total) by mouth 2 (two) times daily for 5 days.    Dispense:  40 capsule    Refill:  0   No orders of the defined types were placed in this encounter.   Signed,  Maud Deed. Leler Brion, MD

## 2021-03-25 ENCOUNTER — Ambulatory Visit: Payer: 59 | Admitting: Family Medicine

## 2021-04-11 ENCOUNTER — Other Ambulatory Visit: Payer: Self-pay | Admitting: Urology

## 2021-04-14 ENCOUNTER — Ambulatory Visit: Payer: Self-pay | Admitting: Urology

## 2021-04-22 ENCOUNTER — Ambulatory Visit: Payer: Self-pay | Admitting: Urology

## 2021-04-28 ENCOUNTER — Ambulatory Visit: Payer: 59 | Admitting: Urology

## 2021-05-12 ENCOUNTER — Encounter: Payer: Self-pay | Admitting: Family Medicine

## 2021-05-13 ENCOUNTER — Ambulatory Visit: Payer: 59 | Admitting: Family Medicine

## 2021-05-14 ENCOUNTER — Ambulatory Visit: Payer: 59 | Admitting: Family Medicine

## 2021-05-16 ENCOUNTER — Telehealth: Payer: Self-pay | Admitting: Family Medicine

## 2021-05-16 NOTE — Telephone Encounter (Signed)
Called patient and discussed that I would be out of clinic on Monday due to an illness.  Please contact patient and reschedule when possible.  Thanks.

## 2021-05-17 ENCOUNTER — Ambulatory Visit: Payer: 59 | Admitting: Family Medicine

## 2021-05-17 ENCOUNTER — Encounter: Payer: Self-pay | Admitting: Family Medicine

## 2021-05-17 NOTE — Telephone Encounter (Signed)
Called patient and left vm to r/s

## 2021-05-27 ENCOUNTER — Encounter: Payer: Self-pay | Admitting: Family Medicine

## 2021-05-27 ENCOUNTER — Other Ambulatory Visit: Payer: Self-pay

## 2021-05-27 ENCOUNTER — Ambulatory Visit: Payer: 59 | Admitting: Family Medicine

## 2021-05-27 DIAGNOSIS — I70211 Atherosclerosis of native arteries of extremities with intermittent claudication, right leg: Secondary | ICD-10-CM

## 2021-05-27 NOTE — Progress Notes (Signed)
This visit occurred during the SARS-CoV-2 public health emergency.  Safety protocols were in place, including screening questions prior to the visit, additional usage of staff PPE, and extensive cleaning of exam room while observing appropriate contact time as indicated for disinfecting solutions.  BPH tx pending per urology.  I'll defer.  He agees.    Leg cramping.  H/o PAD.  H/o stent prev in R leg.  Still with cramping in the posterior calf with walking.  He had PAD eval and the concern was for MSK source.  He had h/o R buttock pain.  He went to chiropractor and right buttock pain improved.  When he walks about 2 tenths of a mile, he has R calf cramping.  He has tried changing seats/cushions, stretching.  No pain with walking shorter distances.   Pain doesn't shoot down the R leg.  No L leg sx.  No pain on standing.  He could have statin induced pain.  His pain is similar to the pain prior to stenting.  H/o CAD with ongoing statin use.  I asked him to follow up with cardiology about lipid management.    Meds, vitals, and allergies reviewed.   ROS: Per HPI unless specifically indicated in ROS section   Nad Ncat Neck supple, no LA Rrr Ctab Abd soft, not ttp Extremities without edema.  Skin well perfused Slightly dec R DP pulse noted.    30 minutes were devoted to patient care in this encounter (this includes time spent reviewing the patient's file/history, interviewing and examining the patient, counseling/reviewing plan with patient).

## 2021-05-27 NOTE — Patient Instructions (Signed)
Let me send a note to vascular asking about getting you in sooner.  We'll go from there.  Take care.  Glad to see you.

## 2021-05-30 NOTE — Assessment & Plan Note (Signed)
History of claudication noted.  He had previous stent placement.  The concerning issue is that he has symptoms of claudication that is similar to his previous stent symptoms and he did not improve with treatment for other musculoskeletal causes.  I will ask for input from vascular surgery.  It is unclear to me if he has microvascular disease causing his symptoms.  It would be atypical for him to have unilateral exertional symptoms from a statin.  He does not have chest pain and he still okay for outpatient follow-up.  Discussed the plan.  He agrees.

## 2021-05-31 ENCOUNTER — Encounter: Payer: Self-pay | Admitting: Family Medicine

## 2021-05-31 ENCOUNTER — Telehealth: Payer: Self-pay | Admitting: Family Medicine

## 2021-05-31 DIAGNOSIS — I2510 Atherosclerotic heart disease of native coronary artery without angina pectoris: Secondary | ICD-10-CM

## 2021-05-31 DIAGNOSIS — E782 Mixed hyperlipidemia: Secondary | ICD-10-CM

## 2021-05-31 NOTE — Telephone Encounter (Signed)
Pt called in stating he is checking on the referral that was sent by provider. Pt called Dr. Debara Pickett office and they said they have not received referral yet.

## 2021-06-01 NOTE — Addendum Note (Signed)
Addended by: Tonia Ghent on: 06/01/2021 09:45 AM   Modules accepted: Orders

## 2021-06-01 NOTE — Telephone Encounter (Signed)
Referral ordered.  Mychart message sent to patient. Thanks.

## 2021-07-02 ENCOUNTER — Encounter (INDEPENDENT_AMBULATORY_CARE_PROVIDER_SITE_OTHER): Payer: Self-pay

## 2021-07-02 ENCOUNTER — Encounter (INDEPENDENT_AMBULATORY_CARE_PROVIDER_SITE_OTHER): Payer: Medicare Other

## 2021-07-02 ENCOUNTER — Ambulatory Visit (INDEPENDENT_AMBULATORY_CARE_PROVIDER_SITE_OTHER): Payer: Medicare Other | Admitting: Vascular Surgery

## 2021-07-22 ENCOUNTER — Other Ambulatory Visit (INDEPENDENT_AMBULATORY_CARE_PROVIDER_SITE_OTHER): Payer: Self-pay | Admitting: Family Medicine

## 2021-07-22 ENCOUNTER — Other Ambulatory Visit (INDEPENDENT_AMBULATORY_CARE_PROVIDER_SITE_OTHER): Payer: Self-pay | Admitting: Nurse Practitioner

## 2021-07-22 DIAGNOSIS — I739 Peripheral vascular disease, unspecified: Secondary | ICD-10-CM

## 2021-07-22 DIAGNOSIS — Z9889 Other specified postprocedural states: Secondary | ICD-10-CM

## 2021-07-23 ENCOUNTER — Encounter (INDEPENDENT_AMBULATORY_CARE_PROVIDER_SITE_OTHER): Payer: Self-pay | Admitting: Nurse Practitioner

## 2021-07-23 ENCOUNTER — Ambulatory Visit (INDEPENDENT_AMBULATORY_CARE_PROVIDER_SITE_OTHER): Payer: 59 | Admitting: Nurse Practitioner

## 2021-07-23 ENCOUNTER — Ambulatory Visit (INDEPENDENT_AMBULATORY_CARE_PROVIDER_SITE_OTHER): Payer: 59

## 2021-07-23 ENCOUNTER — Ambulatory Visit (INDEPENDENT_AMBULATORY_CARE_PROVIDER_SITE_OTHER): Payer: Medicare Other | Admitting: Vascular Surgery

## 2021-07-23 ENCOUNTER — Other Ambulatory Visit: Payer: Self-pay

## 2021-07-23 VITALS — BP 173/91 | HR 66 | Resp 16 | Wt 197.0 lb

## 2021-07-23 DIAGNOSIS — Z9889 Other specified postprocedural states: Secondary | ICD-10-CM

## 2021-07-23 DIAGNOSIS — I10 Essential (primary) hypertension: Secondary | ICD-10-CM

## 2021-07-23 DIAGNOSIS — I739 Peripheral vascular disease, unspecified: Secondary | ICD-10-CM | POA: Diagnosis not present

## 2021-07-23 DIAGNOSIS — E782 Mixed hyperlipidemia: Secondary | ICD-10-CM

## 2021-07-23 DIAGNOSIS — I70229 Atherosclerosis of native arteries of extremities with rest pain, unspecified extremity: Secondary | ICD-10-CM | POA: Diagnosis not present

## 2021-07-23 NOTE — Progress Notes (Signed)
Subjective:    Patient ID: Clayton Powell, male    DOB: 1946-07-03, 75 y.o.   MRN: [id number] Chief Complaint  Patient presents with   Follow-up    Ultrasound follow up    Clayton Powell is a 75 year old male that presents today for follow-up evaluation of right lower extremity leg pain.  The patient has previously had endovascular intervention on his right lower extremity on 10/01/2020 including intervention to the SFA and popliteal area as well as angioplasty to the TP trunk and proximal posterior tibial arteries.  Following intervention the patient had significant reduction in claudication and rest pain symptoms however he still had some pain located in the right lateral shin area.  He notes that recently the pain has become worse whenever he is reclining or in bed and he has needed to dangle his leg off the side of the bed or off the side of his recliner for improvement.  He also notes that the claudication symptoms seem to have returned and worsened as well.  The patient has continued on Crestor and aspirin after initial dual antiplatelet therapy for at least 6 months post intervention.  The patient does not smoke.  He notes that several weeks prior to his appointment today he had his most recent COVID booster shot.  Today noninvasive studies show right ABI 0.70 and a left of 1.16.  Previous ABI done on 01/28/2021 showed a right ABI of 1.27 and a left ABI of 1.23.  Previously the patient had triphasic waveforms throughout the right lower extremity with the exception of the anterior tibial artery which is biphasic.  He also had triphasic tibial artery waveforms in the left lower extremity.  Studies on 01/28/2021 showed patent stents throughout his right lower extremity.  Today noninvasive studies show triphasic waveforms in the deep femoral artery as well as common femoral artery however the proximal to mid SFA is occluded.  Beginning at the distal SFA down to the distal tibials he has monophasic  waveforms throughout.   Review of Systems  Cardiovascular:        Rest pain  All other systems reviewed and are negative.     Objective:   Physical Exam Vitals reviewed.  HENT:     Head: Normocephalic.  Cardiovascular:     Rate and Rhythm: Normal rate.  Pulmonary:     Effort: Pulmonary effort is normal.  Skin:    General: Skin is warm and dry.  Neurological:     Mental Status: He is alert and oriented to person, place, and time.  Psychiatric:        Mood and Affect: Mood normal.        Behavior: Behavior normal.        Thought Content: Thought content normal.        Judgment: Judgment normal.    BP (!) 173/91 (BP Location: Left Arm)    Pulse 66    Resp 16    Wt 197 lb (89.4 kg)    BMI 31.80 kg/m   Past Medical History:  Diagnosis Date   Allergy    Anginal pain (HCC)    BPH (benign prostatic hypertrophy) 6/07   CAD (coronary artery disease) 2010   dx made by coronary calcium scoring, Normal stress test in 2010.    GERD (gastroesophageal reflux disease) 12/05   HLD (hyperlipidemia) 3/99   HTN (hypertension) 2002   no meds   Left rotator cuff tear 08/12/2011   Per Dr. Mardelle Matte 2013 L  rotator cuff tear on MRI 2013    Right rotator cuff tear 05/24/2013   Wears glasses     Social History   Socioeconomic History   Marital status: Married    Spouse name: Not on file   Number of children: Not on file   Years of education: Not on file   Highest education level: Not on file  Occupational History   Not on file  Tobacco Use   Smoking status: Never   Smokeless tobacco: Never  Substance and Sexual Activity   Alcohol use: Yes    Alcohol/week: 0.0 standard drinks    Comment: beer, occ   Drug use: No   Sexual activity: Yes  Other Topics Concern   Not on file  Social History Narrative   Married, 2 children - one is local   Married 40+ years   [place]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    Plays guitar   Works out regularly.   Social Determinants of Health    Financial Resource Strain: Not on file  Food Insecurity: Not on file  Transportation Needs: Not on file  Physical Activity: Not on file  Stress: Not on file  Social Connections: Not on file  Intimate Partner Violence: Not on file    Past Surgical History:  Procedure Laterality Date   admitted to [facility] chest pain  12/15/05   Dr. Clayborn Bigness   BLEPHAROPLASTY  10/11   Dr. Loletta Specter    bone spur removed Right    COLONOSCOPY  09/24/04   nml; Dr. Epifanio Lesches    CORONARY ARTERY BYPASS GRAFT N/A 05/29/2020   Procedure: CORONARY ARTERY BYPASS GRAFTING (CABG) TIMES THREE USING LIMA to LAD; ENDOSCOPIC HARVESTED RIGHT GREATER SAPHENOUS VEIN: SVG to PD; SVG to RAMUS.;  Surgeon: Grace Isaac, MD;  Location: [place];  Service: Open Heart Surgery;  Laterality: N/A;   EGD esophagitis by bx  01/26/06   gastritis bx neg h-pylori   ENDOVEIN HARVEST OF GREATER SAPHENOUS VEIN Right 05/29/2020   Procedure: ENDOVEIN HARVEST OF GREATER SAPHENOUS VEIN;  Surgeon: Grace Isaac, MD;  Location: [place];  Service: Open Heart Surgery;  Laterality: Right;   LEFT HEART CATH AND CORONARY ANGIOGRAPHY N/A 05/26/2020   Procedure: LEFT HEART CATH AND CORONARY ANGIOGRAPHY;  Surgeon: Corey Skains, MD;  Location: [place] [facility];  Service: Cardiovascular;  Laterality: N/A;   LOWER EXTREMITY ANGIOGRAPHY Right 10/01/2020   Procedure: LOWER EXTREMITY ANGIOGRAPHY;  Surgeon: Algernon Huxley, MD;  Location: [place] [facility];  Service: Cardiovascular;  Laterality: Right;   POLYPECTOMY     right and left prostate needle bx  05/02/03   acute inflammation; Dr. Gaynelle Arabian    SHOULDER ARTHROSCOPY W/ ROTATOR CUFF REPAIR  12/2011   left   SHOULDER ARTHROSCOPY WITH ROTATOR CUFF REPAIR AND SUBACROMIAL DECOMPRESSION Right 05/24/2013   Procedure: RIGHT SHOULDER ARTHROSCOPY WITH ROTATOR CUFF REPAIR AND SUBACROMIAL DECOMPRESSION AND PARTIAL ACROMIOPLSTY WITH CORACOACROMIAL RELEASE, DISTAL CLAVICULECTOMY, LABRIAL DEBRIDEMENT;  Surgeon: Johnny Bridge, MD;  Location: [place];  Service: Orthopedics;  Laterality: Right;   spect ETT nml  12/16/05   TEE WITHOUT CARDIOVERSION N/A 05/29/2020   Procedure: TRANSESOPHAGEAL ECHOCARDIOGRAM (TEE);  Surgeon: Grace Isaac, MD;  Location: [place];  Service: Open Heart Surgery;  Laterality: N/A;   UPPER GASTROINTESTINAL ENDOSCOPY     WISDOM TOOTH EXTRACTION      Family History  Problem Relation Age of Onset   Stroke Mother    Glaucoma Other  grandmother at 24   Barrett's esophagus Maternal Uncle    Prostate cancer Neg Hx    Colon cancer Neg Hx    Colon polyps Neg Hx    Esophageal cancer Neg Hx    Rectal cancer Neg Hx    Stomach cancer Neg Hx     Allergies  Allergen Reactions   Ciprofloxacin Hcl Rash   Quinolones Rash    CBC Latest Ref Rng & Units 06/01/2020 05/31/2020 05/30/2020  WBC 4.0 - 10.5 K/uL 9.5 9.5 10.2  Hemoglobin 13.0 - 17.0 g/dL 10.8(L) 10.1(L) 11.5(L)  Hematocrit 39.0 - 52.0 % 32.9(L) 31.4(L) 35.3(L)  Platelets 150 - 400 K/uL 141(L) 117(L) 132(L)      CMP     Component Value Date/Time   NA 140 06/02/2020 0824   NA 140 05/12/2012 1240   K 3.9 06/02/2020 0824   K 4.2 05/12/2012 1240   CL 106 06/02/2020 0824   CL 107 05/12/2012 1240   CO2 25 06/02/2020 0824   CO2 26 05/12/2012 1240   GLUCOSE 102 (H) 06/02/2020 0824   GLUCOSE 101 (H) 05/12/2012 1240   BUN 19 10/01/2020 0819   BUN 21 (H) 05/12/2012 1240   CREATININE 0.90 10/01/2020 0819   CREATININE 0.86 05/12/2012 1240   CALCIUM 8.3 (L) 06/02/2020 0824   CALCIUM 8.9 05/12/2012 1240   PROT 7.1 05/25/2020 1715   PROT 7.4 01/17/2012 0807   ALBUMIN 4.4 05/25/2020 1715   ALBUMIN 3.9 01/17/2012 0807   AST 34 05/25/2020 1715   AST 57 (H) 01/17/2012 0807   ALT 29 05/25/2020 1715   ALT 49 01/17/2012 0807   ALKPHOS 73 05/25/2020 1715   ALKPHOS 74 01/17/2012 0807   BILITOT 0.8 05/25/2020 1715   BILITOT 0.8 01/17/2012 0807   GFRNONAA >60 10/01/2020 0819   GFRNONAA >60 05/12/2012 1240    GFRAA >60 05/12/2012 1240     No results found.     Assessment & Plan:   1. Atherosclerotic peripheral vascular disease with rest pain (HCC) Recommend:  The patient has evidence of severe atherosclerotic changes of the right lower extremities with rest pain that is associated with preulcerative changes and impending tissue loss of the foot.  This represents a limb threatening ischemia and places the patient at the risk for limb loss.  Patient should undergo angiography of the lower extremities with the hope for intervention for limb salvage.  The risks and benefits as well as the alternative therapies was discussed in detail with the patient.  All questions were answered.  Patient agrees to proceed with angiography.  The patient will follow up with me in the office after the procedure.       2. Hyperlipidemia, mixed Continue statin as ordered and reviewed, no changes at this time   3. Primary hypertension Continue antihypertensive medications as already ordered, these medications have been reviewed and there are no changes at this time.    Current Outpatient Medications on File Prior to Visit  Medication Sig Dispense Refill   aspirin EC 81 MG tablet Take 81 mg by mouth daily. Swallow whole.     Azelastine-Fluticasone 137-50 MCG/ACT SUSP Place 1 spray into both nostrils 2 (two) times daily.     calcium citrate (CALCITRATE - DOSED IN MG ELEMENTAL CALCIUM) 950 (200 Ca) MG tablet Take 1 tablet by mouth 2 (two) times daily.     Cholecalciferol (VITAMIN D) 2000 UNITS CAPS Take 1 capsule by mouth daily.     Cyanocobalamin (VITAMIN B-12) 5000 MCG  SUBL Place 1 tablet under the tongue daily.     fexofenadine (ALLEGRA) 180 MG tablet Take 1 tablet by mouth daily as needed.     finasteride (PROSCAR) 5 MG tablet Take 1 tablet (5 mg total) by mouth daily. 90 tablet 3   icosapent Ethyl (VASCEPA) 1 g capsule Take 1 tablet by mouth 4 (four) times daily.     montelukast (SINGULAIR) 10 MG tablet  Take by mouth.     Multiple Vitamin (MULTIVITAMIN) tablet Take 1 tablet by mouth daily.     rosuvastatin (CRESTOR) 40 MG tablet Take 40 mg by mouth daily.     tadalafil (CIALIS) 5 MG tablet Take by mouth.     alfuzosin (UROXATRAL) 10 MG 24 hr tablet Take 1 tablet (10 mg total) by mouth daily with breakfast. 90 tablet 3   Coenzyme Q10 (Q-10 CO-ENZYME PO)      Magnesium 400 MG CAPS Take by mouth.     MILK THISTLE PO Take 1 capsule by mouth daily.     Vitamin E 180 MG (400 UNIT) CAPS Take by mouth.     No current facility-administered medications on file prior to visit.    There are no Patient Instructions on file for this visit. No follow-ups on file.   Kris Hartmann, NP

## 2021-07-23 NOTE — H&P (View-Only) (Signed)
Subjective:    Patient ID: Clayton Powell, male    DOB: 21-Nov-1945, 75 y.o.   MRN: [id number] Chief Complaint  Patient presents with   Follow-up    Ultrasound follow up    Clayton Powell is a 75 year old male that presents today for follow-up evaluation of right lower extremity leg pain.  The patient has previously had endovascular intervention on his right lower extremity on 10/01/2020 including intervention to the SFA and popliteal area as well as angioplasty to the TP trunk and proximal posterior tibial arteries.  Following intervention the patient had significant reduction in claudication and rest pain symptoms however he still had some pain located in the right lateral shin area.  He notes that recently the pain has become worse whenever he is reclining or in bed and he has needed to dangle his leg off the side of the bed or off the side of his recliner for improvement.  He also notes that the claudication symptoms seem to have returned and worsened as well.  The patient has continued on Crestor and aspirin after initial dual antiplatelet therapy for at least 6 months post intervention.  The patient does not smoke.  He notes that several weeks prior to his appointment today he had his most recent COVID booster shot.  Today noninvasive studies show right ABI 0.70 and a left of 1.16.  Previous ABI done on 01/28/2021 showed a right ABI of 1.27 and a left ABI of 1.23.  Previously the patient had triphasic waveforms throughout the right lower extremity with the exception of the anterior tibial artery which is biphasic.  He also had triphasic tibial artery waveforms in the left lower extremity.  Studies on 01/28/2021 showed patent stents throughout his right lower extremity.  Today noninvasive studies show triphasic waveforms in the deep femoral artery as well as common femoral artery however the proximal to mid SFA is occluded.  Beginning at the distal SFA down to the distal tibials he has monophasic  waveforms throughout.   Review of Systems  Cardiovascular:        Rest pain  All other systems reviewed and are negative.     Objective:   Physical Exam Vitals reviewed.  HENT:     Head: Normocephalic.  Cardiovascular:     Rate and Rhythm: Normal rate.  Pulmonary:     Effort: Pulmonary effort is normal.  Skin:    General: Skin is warm and dry.  Neurological:     Mental Status: He is alert and oriented to person, place, and time.  Psychiatric:        Mood and Affect: Mood normal.        Behavior: Behavior normal.        Thought Content: Thought content normal.        Judgment: Judgment normal.    BP (!) 173/91 (BP Location: Left Arm)    Pulse 66    Resp 16    Wt 197 lb (89.4 kg)    BMI 31.80 kg/m   Past Medical History:  Diagnosis Date   Allergy    Anginal pain (HCC)    BPH (benign prostatic hypertrophy) 6/07   CAD (coronary artery disease) 2010   dx made by coronary calcium scoring, Normal stress test in 2010.    GERD (gastroesophageal reflux disease) 12/05   HLD (hyperlipidemia) 3/99   HTN (hypertension) 2002   no meds   Left rotator cuff tear 08/12/2011   Per Dr. Mardelle Matte 2013 L  rotator cuff tear on MRI 2013    Right rotator cuff tear 05/24/2013   Wears glasses     Social History   Socioeconomic History   Marital status: Married    Spouse name: Not on file   Number of children: Not on file   Years of education: Not on file   Highest education level: Not on file  Occupational History   Not on file  Tobacco Use   Smoking status: Never   Smokeless tobacco: Never  Substance and Sexual Activity   Alcohol use: Yes    Alcohol/week: 0.0 standard drinks    Comment: beer, occ   Drug use: No   Sexual activity: Yes  Other Topics Concern   Not on file  Social History Narrative   Married, 2 children - one is local   Married 40+ years   [place]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    Plays guitar   Works out regularly.   Social Determinants of Health    Financial Resource Strain: Not on file  Food Insecurity: Not on file  Transportation Needs: Not on file  Physical Activity: Not on file  Stress: Not on file  Social Connections: Not on file  Intimate Partner Violence: Not on file    Past Surgical History:  Procedure Laterality Date   admitted to [facility] chest pain  12/15/05   Dr. Clayborn Bigness   BLEPHAROPLASTY  10/11   Dr. Loletta Specter    bone spur removed Right    COLONOSCOPY  09/24/04   nml; Dr. Epifanio Lesches    CORONARY ARTERY BYPASS GRAFT N/A 05/29/2020   Procedure: CORONARY ARTERY BYPASS GRAFTING (CABG) TIMES THREE USING LIMA to LAD; ENDOSCOPIC HARVESTED RIGHT GREATER SAPHENOUS VEIN: SVG to PD; SVG to RAMUS.;  Surgeon: Grace Isaac, MD;  Location: [place];  Service: Open Heart Surgery;  Laterality: N/A;   EGD esophagitis by bx  01/26/06   gastritis bx neg h-pylori   ENDOVEIN HARVEST OF GREATER SAPHENOUS VEIN Right 05/29/2020   Procedure: ENDOVEIN HARVEST OF GREATER SAPHENOUS VEIN;  Surgeon: Grace Isaac, MD;  Location: [place];  Service: Open Heart Surgery;  Laterality: Right;   LEFT HEART CATH AND CORONARY ANGIOGRAPHY N/A 05/26/2020   Procedure: LEFT HEART CATH AND CORONARY ANGIOGRAPHY;  Surgeon: Corey Skains, MD;  Location: [place] [facility];  Service: Cardiovascular;  Laterality: N/A;   LOWER EXTREMITY ANGIOGRAPHY Right 10/01/2020   Procedure: LOWER EXTREMITY ANGIOGRAPHY;  Surgeon: Algernon Huxley, MD;  Location: [place] [facility];  Service: Cardiovascular;  Laterality: Right;   POLYPECTOMY     right and left prostate needle bx  05/02/03   acute inflammation; Dr. Gaynelle Arabian    SHOULDER ARTHROSCOPY W/ ROTATOR CUFF REPAIR  12/2011   left   SHOULDER ARTHROSCOPY WITH ROTATOR CUFF REPAIR AND SUBACROMIAL DECOMPRESSION Right 05/24/2013   Procedure: RIGHT SHOULDER ARTHROSCOPY WITH ROTATOR CUFF REPAIR AND SUBACROMIAL DECOMPRESSION AND PARTIAL ACROMIOPLSTY WITH CORACOACROMIAL RELEASE, DISTAL CLAVICULECTOMY, LABRIAL DEBRIDEMENT;  Surgeon: Johnny Bridge, MD;  Location: [place];  Service: Orthopedics;  Laterality: Right;   spect ETT nml  12/16/05   TEE WITHOUT CARDIOVERSION N/A 05/29/2020   Procedure: TRANSESOPHAGEAL ECHOCARDIOGRAM (TEE);  Surgeon: Grace Isaac, MD;  Location: [place];  Service: Open Heart Surgery;  Laterality: N/A;   UPPER GASTROINTESTINAL ENDOSCOPY     WISDOM TOOTH EXTRACTION      Family History  Problem Relation Age of Onset   Stroke Mother    Glaucoma Other  grandmother at 53   Barrett's esophagus Maternal Uncle    Prostate cancer Neg Hx    Colon cancer Neg Hx    Colon polyps Neg Hx    Esophageal cancer Neg Hx    Rectal cancer Neg Hx    Stomach cancer Neg Hx     Allergies  Allergen Reactions   Ciprofloxacin Hcl Rash   Quinolones Rash    CBC Latest Ref Rng & Units 06/01/2020 05/31/2020 05/30/2020  WBC 4.0 - 10.5 K/uL 9.5 9.5 10.2  Hemoglobin 13.0 - 17.0 g/dL 10.8(L) 10.1(L) 11.5(L)  Hematocrit 39.0 - 52.0 % 32.9(L) 31.4(L) 35.3(L)  Platelets 150 - 400 K/uL 141(L) 117(L) 132(L)      CMP     Component Value Date/Time   NA 140 06/02/2020 0824   NA 140 05/12/2012 1240   K 3.9 06/02/2020 0824   K 4.2 05/12/2012 1240   CL 106 06/02/2020 0824   CL 107 05/12/2012 1240   CO2 25 06/02/2020 0824   CO2 26 05/12/2012 1240   GLUCOSE 102 (H) 06/02/2020 0824   GLUCOSE 101 (H) 05/12/2012 1240   BUN 19 10/01/2020 0819   BUN 21 (H) 05/12/2012 1240   CREATININE 0.90 10/01/2020 0819   CREATININE 0.86 05/12/2012 1240   CALCIUM 8.3 (L) 06/02/2020 0824   CALCIUM 8.9 05/12/2012 1240   PROT 7.1 05/25/2020 1715   PROT 7.4 01/17/2012 0807   ALBUMIN 4.4 05/25/2020 1715   ALBUMIN 3.9 01/17/2012 0807   AST 34 05/25/2020 1715   AST 57 (H) 01/17/2012 0807   ALT 29 05/25/2020 1715   ALT 49 01/17/2012 0807   ALKPHOS 73 05/25/2020 1715   ALKPHOS 74 01/17/2012 0807   BILITOT 0.8 05/25/2020 1715   BILITOT 0.8 01/17/2012 0807   GFRNONAA >60 10/01/2020 0819   GFRNONAA >60 05/12/2012 1240    GFRAA >60 05/12/2012 1240     No results found.     Assessment & Plan:   1. Atherosclerotic peripheral vascular disease with rest pain (HCC) Recommend:  The patient has evidence of severe atherosclerotic changes of the right lower extremities with rest pain that is associated with preulcerative changes and impending tissue loss of the foot.  This represents a limb threatening ischemia and places the patient at the risk for limb loss.  Patient should undergo angiography of the lower extremities with the hope for intervention for limb salvage.  The risks and benefits as well as the alternative therapies was discussed in detail with the patient.  All questions were answered.  Patient agrees to proceed with angiography.  The patient will follow up with me in the office after the procedure.       2. Hyperlipidemia, mixed Continue statin as ordered and reviewed, no changes at this time   3. Primary hypertension Continue antihypertensive medications as already ordered, these medications have been reviewed and there are no changes at this time.    Current Outpatient Medications on File Prior to Visit  Medication Sig Dispense Refill   aspirin EC 81 MG tablet Take 81 mg by mouth daily. Swallow whole.     Azelastine-Fluticasone 137-50 MCG/ACT SUSP Place 1 spray into both nostrils 2 (two) times daily.     calcium citrate (CALCITRATE - DOSED IN MG ELEMENTAL CALCIUM) 950 (200 Ca) MG tablet Take 1 tablet by mouth 2 (two) times daily.     Cholecalciferol (VITAMIN D) 2000 UNITS CAPS Take 1 capsule by mouth daily.     Cyanocobalamin (VITAMIN B-12) 5000 MCG  SUBL Place 1 tablet under the tongue daily.     fexofenadine (ALLEGRA) 180 MG tablet Take 1 tablet by mouth daily as needed.     finasteride (PROSCAR) 5 MG tablet Take 1 tablet (5 mg total) by mouth daily. 90 tablet 3   icosapent Ethyl (VASCEPA) 1 g capsule Take 1 tablet by mouth 4 (four) times daily.     montelukast (SINGULAIR) 10 MG tablet  Take by mouth.     Multiple Vitamin (MULTIVITAMIN) tablet Take 1 tablet by mouth daily.     rosuvastatin (CRESTOR) 40 MG tablet Take 40 mg by mouth daily.     tadalafil (CIALIS) 5 MG tablet Take by mouth.     alfuzosin (UROXATRAL) 10 MG 24 hr tablet Take 1 tablet (10 mg total) by mouth daily with breakfast. 90 tablet 3   Coenzyme Q10 (Q-10 CO-ENZYME PO)      Magnesium 400 MG CAPS Take by mouth.     MILK THISTLE PO Take 1 capsule by mouth daily.     Vitamin E 180 MG (400 UNIT) CAPS Take by mouth.     No current facility-administered medications on file prior to visit.    There are no Patient Instructions on file for this visit. No follow-ups on file.   Kris Hartmann, NP

## 2021-07-27 ENCOUNTER — Encounter (INDEPENDENT_AMBULATORY_CARE_PROVIDER_SITE_OTHER): Payer: Self-pay

## 2021-07-27 ENCOUNTER — Ambulatory Visit (INDEPENDENT_AMBULATORY_CARE_PROVIDER_SITE_OTHER): Payer: Medicare Other | Admitting: Vascular Surgery

## 2021-07-28 ENCOUNTER — Telehealth (INDEPENDENT_AMBULATORY_CARE_PROVIDER_SITE_OTHER): Payer: Self-pay

## 2021-07-28 NOTE — Telephone Encounter (Signed)
Patient called in wanting to know the status of the [facility]. Procedure. Requesting a call back.

## 2021-07-28 NOTE — Telephone Encounter (Signed)
Spoke with the patient and advised that I was still waiting on a approval of his prior authorization. Patient would like to be scheduled for 08/05/21 with Dr. Lucky Cowboy.

## 2021-07-28 NOTE — Progress Notes (Signed)
We are currently working to arrange lower extremity intervention for Mr. Clayton Powell.  Thank you!

## 2021-08-03 ENCOUNTER — Ambulatory Visit (INDEPENDENT_AMBULATORY_CARE_PROVIDER_SITE_OTHER): Payer: Medicare Other | Admitting: Vascular Surgery

## 2021-08-03 ENCOUNTER — Encounter (INDEPENDENT_AMBULATORY_CARE_PROVIDER_SITE_OTHER): Payer: Medicare Other

## 2021-08-06 ENCOUNTER — Telehealth (INDEPENDENT_AMBULATORY_CARE_PROVIDER_SITE_OTHER): Payer: Self-pay

## 2021-08-06 NOTE — Telephone Encounter (Signed)
I attempted to contact the patient to scheduled  RLE angio with Dr. Lucky Cowboy and a message was left for a return call.

## 2021-08-09 ENCOUNTER — Telehealth (INDEPENDENT_AMBULATORY_CARE_PROVIDER_SITE_OTHER): Payer: Self-pay

## 2021-08-09 NOTE — Telephone Encounter (Signed)
Spoke with the patient and he is scheduled with Dr. Lucky Cowboy for a RLE angio on 08/12/21 with a 8:30 am arrival time to the [facility]. Pre-procedure instructions were discussed and will be mailed.

## 2021-08-12 ENCOUNTER — Other Ambulatory Visit (INDEPENDENT_AMBULATORY_CARE_PROVIDER_SITE_OTHER): Payer: Self-pay | Admitting: Nurse Practitioner

## 2021-08-12 ENCOUNTER — Encounter
Admission: RE | Disposition: A | Discharge status: Home / Self Care | Payer: Self-pay | Source: Home / Self Care | Attending: Vascular Surgery

## 2021-08-12 ENCOUNTER — Ambulatory Visit
Admission: RE | Admit: 2021-08-12 | Discharge: 2021-08-12 | Disposition: A | Discharge status: Home / Self Care | Payer: 59 | Attending: Vascular Surgery | Admitting: Vascular Surgery

## 2021-08-12 ENCOUNTER — Other Ambulatory Visit: Payer: Self-pay

## 2021-08-12 ENCOUNTER — Encounter: Payer: Self-pay | Admitting: Vascular Surgery

## 2021-08-12 DIAGNOSIS — I10 Essential (primary) hypertension: Secondary | ICD-10-CM | POA: Diagnosis not present

## 2021-08-12 DIAGNOSIS — I70221 Atherosclerosis of native arteries of extremities with rest pain, right leg: Secondary | ICD-10-CM | POA: Insufficient documentation

## 2021-08-12 DIAGNOSIS — I70211 Atherosclerosis of native arteries of extremities with intermittent claudication, right leg: Secondary | ICD-10-CM | POA: Diagnosis not present

## 2021-08-12 DIAGNOSIS — Z9889 Other specified postprocedural states: Secondary | ICD-10-CM

## 2021-08-12 DIAGNOSIS — E782 Mixed hyperlipidemia: Secondary | ICD-10-CM | POA: Insufficient documentation

## 2021-08-12 DIAGNOSIS — I70229 Atherosclerosis of native arteries of extremities with rest pain, unspecified extremity: Secondary | ICD-10-CM

## 2021-08-12 HISTORY — PX: LOWER EXTREMITY ANGIOGRAPHY: CATH118251

## 2021-08-12 LAB — CREATININE, SERUM
Creatinine, Ser: 0.88 mg/dL (ref 0.61–1.24)
GFR, Estimated: >60 mL/min (ref >60)

## 2021-08-12 LAB — BUN: BUN: 20 mg/dL (ref 8–23)

## 2021-08-12 SURGERY — LOWER EXTREMITY ANGIOGRAPHY
Anesthesia: Moderate Sedation | Site: Leg Lower | Laterality: Right

## 2021-08-12 MED ORDER — SODIUM CHLORIDE 0.9 % IV SOLN: INTRAVENOUS | Status: DC

## 2021-08-12 MED ORDER — DIPHENHYDRAMINE HCL 50 MG/ML IJ SOLN: 50.0000 mg | Freq: Once | INTRAMUSCULAR | Status: DC | PRN

## 2021-08-12 MED ORDER — CEFAZOLIN SODIUM-DEXTROSE 2-4 GM/100ML-% IV SOLN: 2.0000 g | Freq: Once | INTRAVENOUS | Status: DC

## 2021-08-12 MED ORDER — HYDROMORPHONE HCL 1 MG/ML IJ SOLN: 1.0000 mg | Freq: Once | INTRAMUSCULAR | Status: DC | PRN

## 2021-08-12 MED ORDER — MIDAZOLAM HCL 2 MG/2ML IJ SOLN
INTRAMUSCULAR | Status: DC | PRN
  Administered 2021-08-12: 1 mg via INTRAVENOUS
  Administered 2021-08-12: 2 mg via INTRAVENOUS
  Administered 2021-08-12: 1 mg via INTRAVENOUS

## 2021-08-12 MED ORDER — FENTANYL CITRATE PF 50 MCG/ML IJ SOSY
PREFILLED_SYRINGE | INTRAMUSCULAR | Status: AC
  Filled 2021-08-12: qty 1

## 2021-08-12 MED ORDER — MIDAZOLAM HCL 2 MG/ML PO SYRP: 8.0000 mg | ORAL_SOLUTION | Freq: Once | ORAL | Status: DC | PRN

## 2021-08-12 MED ORDER — MIDAZOLAM HCL 2 MG/2ML IJ SOLN
INTRAMUSCULAR | Status: AC
  Filled 2021-08-12: qty 2

## 2021-08-12 MED ORDER — HEPARIN SODIUM (PORCINE) 1000 UNIT/ML IJ SOLN
INTRAMUSCULAR | Status: DC | PRN
  Administered 2021-08-12: 5000 [IU] via INTRAVENOUS

## 2021-08-12 MED ORDER — ONDANSETRON HCL 4 MG/2ML IJ SOLN: 4.0000 mg | Freq: Four times a day (QID) | INTRAMUSCULAR | Status: DC | PRN

## 2021-08-12 MED ORDER — CEFAZOLIN SODIUM-DEXTROSE 2-4 GM/100ML-% IV SOLN
INTRAVENOUS | Status: AC
  Administered 2021-08-12: 2 g
  Filled 2021-08-12: qty 100

## 2021-08-12 MED ORDER — HEPARIN SODIUM (PORCINE) 1000 UNIT/ML IJ SOLN
INTRAMUSCULAR | Status: AC
  Filled 2021-08-12: qty 10

## 2021-08-12 MED ORDER — METHYLPREDNISOLONE SODIUM SUCC 125 MG IJ SOLR: 125.0000 mg | Freq: Once | INTRAMUSCULAR | Status: DC | PRN

## 2021-08-12 MED ORDER — FENTANYL CITRATE (PF) 100 MCG/2ML IJ SOLN
INTRAMUSCULAR | Status: DC | PRN
  Administered 2021-08-12: 25 ug via INTRAVENOUS
  Administered 2021-08-12: 50 ug via INTRAVENOUS
  Administered 2021-08-12: 25 ug via INTRAVENOUS

## 2021-08-12 MED ORDER — FAMOTIDINE 20 MG PO TABS: 40.0000 mg | ORAL_TABLET | Freq: Once | ORAL | Status: DC | PRN

## 2021-08-12 SURGICAL SUPPLY — 19 items
BALLN ULTRVRSE 3X300X150 (BALLOONS) ×2
BALLN ULTRVRSE 3X300X150 OTW (BALLOONS) ×1
BALLOON ULTRVRSE 3X300X150 OTW (BALLOONS) IMPLANT
CATH ANGIO 5F PIGTAIL 65CM (CATHETERS) ×1 IMPLANT
CATH BEACON 5 .038 100 VERT TP (CATHETERS) ×1 IMPLANT
CATH NAVICROSS ANGLED 135CM (MICROCATHETER) ×1 IMPLANT
CATH ROTAREX 135 6FR (CATHETERS) ×1 IMPLANT
COVER PROBE U/S 5X48 (MISCELLANEOUS) ×1 IMPLANT
DEVICE STARCLOSE SE CLOSURE (Vascular Products) ×1 IMPLANT
DEVICE TORQUE .025-.038 (MISCELLANEOUS) ×1 IMPLANT
GLIDEWIRE ADV .035X260CM (WIRE) ×2 IMPLANT
KIT ENCORE 26 ADVANTAGE (KITS) ×1 IMPLANT
PACK ANGIOGRAPHY (CUSTOM PROCEDURE TRAY) ×2 IMPLANT
SHEATH ANL2 6FRX45 HC (SHEATH) ×1 IMPLANT
SHEATH BRITE TIP 5FRX11 (SHEATH) ×1 IMPLANT
SYR MEDRAD MARK 7 150ML (SYRINGE) ×1 IMPLANT
TUBING CONTRAST HIGH PRESS 72 (TUBING) ×1 IMPLANT
WIRE G V18X300CM (WIRE) ×1 IMPLANT
WIRE GUIDERIGHT .035X150 (WIRE) ×1 IMPLANT

## 2021-08-12 NOTE — Progress Notes (Signed)
Dr. Lucky Cowboy speaking with pt. And his wife re: procedural results. Both verbalize understanding of conversation.

## 2021-08-12 NOTE — Op Note (Signed)
[facility]  Percutaneous Study/Intervention Procedural Note   Date of Surgery: 08/12/2021  Surgeon(s):Jeshua Ransford    Assistants:none  Pre-operative Diagnosis: PAD with claudication right lower extremity  Post-operative diagnosis:  Same  Procedure(s) Performed:             1.  Ultrasound guidance for vascular access left femoral artery             2.  Catheter placement into right common femoral artery from left femoral approach             3.  Aortogram and selective right lower extremity angiogram             4.  Percutaneous transluminal angioplasty of right anterior tibial artery with 3 mm diameter angioplasty balloon             5.  Mechanical thrombectomy with the Rota Rex device to the right SFA  6.  Percutaneous transluminal angioplasty of right SFA and popliteal arteries with 3 mm diameter angioplasty balloon             7.  StarClose closure device left femoral artery  EBL: 10 cc  Contrast: 65 cc  Fluoro Time: 10.8 minutes  Moderate Conscious Sedation Time: approximately 47 minutes using 4 mg of Versed and 100 mcg of Fentanyl              Indications:  Patient is a 76 y.o.male with significant claudication symptoms on the right leg. The patient has noninvasive study showing recurrent occlusion of his right SFA and popliteal interventions. The patient is brought in for angiography for further evaluation and potential treatment. Risks and benefits are discussed and informed consent is obtained.   Procedure:  The patient was identified and appropriate procedural time out was performed.  The patient was then placed supine on the table and prepped and draped in the usual sterile fashion. Moderate conscious sedation was administered during a face to face encounter with the patient throughout the procedure with my supervision of the RN administering medicines and monitoring the patient's vital signs, pulse oximetry, telemetry and mental status throughout from  the start of the procedure until the patient was taken to the recovery room. Ultrasound was used to evaluate the left common femoral artery.  It was patent .  A digital ultrasound image was acquired.  A Seldinger needle was used to access the left common femoral artery under direct ultrasound guidance and a permanent image was performed.  A 0.035 J wire was advanced without resistance and a 5Fr sheath was placed.  Pigtail catheter was placed into the aorta and an AP aortogram was performed. This demonstrated normal renal arteries and normal aorta and iliac segments without significant stenosis. I then crossed the aortic bifurcation and advanced to the right femoral head. Selective right lower extremity angiogram was then performed. This demonstrated Occlusion of the right SFA just beyond its origin with occlusion of the 2 previously placed stents in the right SFA and popliteal artery.  There is reconstitution of the below-knee popliteal artery with all 3 tibial vessels patent with the peroneal and posterior tibial arteries being the best runoff distally. It was felt that it was in the patient's best interest to proceed with intervention after these images to avoid a second procedure and a larger amount of contrast and fluoroscopy based off of the findings from the initial angiogram. The patient was systemically heparinized and a 6 [place] Ansell sheath was then placed over  the Terumo Advantage wire. I then used a Kumpe catheter and the advantage wire to navigate down into the occlusion in the SFA.  It was very difficult to get across the proximal cap of the initial life stent that had been placed in the SFA.  We eventually were able to get into the stents and across the stents into the anterior tibial artery although the primary runoff was the tibioperoneal trunk with both the posterior tibial and peroneal arteries and I was not able to get into these vessels.  I placed a 0.018 wire in the anterior tibial artery.  I  then used a 3 mm diameter by 30 cm length angioplasty balloon and treated the anterior tibial artery and popliteal artery inflating this up to 8 atm for 1 minute.  It was then pulled up into the popliteal artery and the SFA and inflated to 12 atm for 1 minute.  I then used the [place] Rex device and for mechanical thrombectomy to the right SFA but it would not get across the leading edge of the previously placed stent.  After mechanical thrombectomy and angioplasty, I attempted to pass the catheter down but even the Nava cross and seeker catheters were hanging up significantly on the leading edge of the previously placed stent even though it appeared to be within it.  At this point, I felt our best bet at revascularization would be a pedal approach but this would be performed another day and we discussed this with the patient. I elected to terminate the procedure. The sheath was removed and StarClose closure device was deployed in the left femoral artery with excellent hemostatic result. The patient was taken to the recovery room in stable condition having tolerated the procedure well.  Findings:               Aortogram: Normal renal arteries, normal aorta and iliac artery without significant stenosis             Right Lower Extremity: Occlusion of the right SFA just beyond its origin with occlusion of the 2 previously placed stents in the right SFA and popliteal artery.  There is reconstitution of the below-knee popliteal artery with all 3 tibial vessels patent with the peroneal and posterior tibial arteries being the best runoff distally.   Disposition: Patient was taken to the recovery room in stable condition having tolerated the procedure well.  Complications: None  Leotis Pain 08/12/2021 12:16 PM   This note was created with Dragon Medical transcription system. Any errors in dictation are purely unintentional.

## 2021-08-12 NOTE — Interval H&P Note (Signed)
History and Physical Interval Note:  08/12/2021 8:13 AM  Clayton Powell  has presented today for surgery, with the diagnosis of RLE Angio  BARD   ASO w rest pain.  The various methods of treatment have been discussed with the patient and family. After consideration of risks, benefits and other options for treatment, the patient has consented to  Procedure(s): LOWER EXTREMITY ANGIOGRAPHY (Right) as a surgical intervention.  The patient's history has been reviewed, patient examined, no change in status, stable for surgery.  I have reviewed the patient's chart and labs.  Questions were answered to the patient's satisfaction.     Leotis Pain

## 2021-08-13 ENCOUNTER — Encounter: Payer: Self-pay | Admitting: Vascular Surgery

## 2021-08-13 ENCOUNTER — Telehealth (INDEPENDENT_AMBULATORY_CARE_PROVIDER_SITE_OTHER): Payer: Self-pay

## 2021-08-13 NOTE — Telephone Encounter (Signed)
Spoke with the patient and he is scheduled with Dr. Lucky Cowboy for a right leg angio with pedal approach on 08/23/21 with a 6:45 am arrival time to the MM. Pre-procedure instructions were discussed and will be mailed.

## 2021-08-23 ENCOUNTER — Encounter
Admission: RE | Disposition: A | Discharge status: Home / Self Care | Payer: Self-pay | Source: Home / Self Care | Attending: Vascular Surgery

## 2021-08-23 ENCOUNTER — Other Ambulatory Visit (INDEPENDENT_AMBULATORY_CARE_PROVIDER_SITE_OTHER): Payer: Self-pay | Admitting: Nurse Practitioner

## 2021-08-23 ENCOUNTER — Other Ambulatory Visit: Payer: Self-pay

## 2021-08-23 ENCOUNTER — Encounter: Payer: Self-pay | Admitting: Vascular Surgery

## 2021-08-23 ENCOUNTER — Ambulatory Visit
Admission: RE | Admit: 2021-08-23 | Discharge: 2021-08-23 | Disposition: A | Discharge status: Home / Self Care | Payer: 59 | Attending: Vascular Surgery | Admitting: Vascular Surgery

## 2021-08-23 DIAGNOSIS — Z9889 Other specified postprocedural states: Secondary | ICD-10-CM

## 2021-08-23 DIAGNOSIS — I70221 Atherosclerosis of native arteries of extremities with rest pain, right leg: Secondary | ICD-10-CM

## 2021-08-23 DIAGNOSIS — I739 Peripheral vascular disease, unspecified: Secondary | ICD-10-CM

## 2021-08-23 DIAGNOSIS — I10 Essential (primary) hypertension: Secondary | ICD-10-CM | POA: Diagnosis not present

## 2021-08-23 DIAGNOSIS — I70229 Atherosclerosis of native arteries of extremities with rest pain, unspecified extremity: Secondary | ICD-10-CM

## 2021-08-23 HISTORY — PX: LOWER EXTREMITY ANGIOGRAPHY: CATH118251

## 2021-08-23 LAB — CREATININE, SERUM
Creatinine, Ser: 0.97 mg/dL (ref 0.61–1.24)
GFR, Estimated: >60 mL/min (ref >60)

## 2021-08-23 LAB — BUN: BUN: 25 mg/dL — ABNORMAL HIGH (ref 8–23)

## 2021-08-23 SURGERY — LOWER EXTREMITY ANGIOGRAPHY
Anesthesia: Moderate Sedation | Site: Leg Lower | Laterality: Right

## 2021-08-23 MED ORDER — ONDANSETRON HCL 4 MG/2ML IJ SOLN: 4.0000 mg | Freq: Four times a day (QID) | INTRAMUSCULAR | Status: DC | PRN

## 2021-08-23 MED ORDER — CLOPIDOGREL BISULFATE 75 MG PO TABS: 75.0000 mg | ORAL_TABLET | Freq: Every day | ORAL | 11 refills | Status: DC

## 2021-08-23 MED ORDER — FENTANYL CITRATE PF 50 MCG/ML IJ SOSY
PREFILLED_SYRINGE | INTRAMUSCULAR | Status: AC
  Filled 2021-08-23: qty 1

## 2021-08-23 MED ORDER — FENTANYL CITRATE (PF) 100 MCG/2ML IJ SOLN
INTRAMUSCULAR | Status: DC | PRN
  Administered 2021-08-23: 50 ug via INTRAVENOUS
  Administered 2021-08-23 (×5): 25 ug via INTRAVENOUS

## 2021-08-23 MED ORDER — HEPARIN SODIUM (PORCINE) 1000 UNIT/ML IJ SOLN
INTRAMUSCULAR | Status: DC | PRN
  Administered 2021-08-23: 5000 [IU] via INTRAVENOUS

## 2021-08-23 MED ORDER — LABETALOL HCL 5 MG/ML IV SOLN: 10.0000 mg | INTRAVENOUS | Status: DC | PRN

## 2021-08-23 MED ORDER — MIDAZOLAM HCL 2 MG/2ML IJ SOLN
INTRAMUSCULAR | Status: DC | PRN
  Administered 2021-08-23: 2 mg via INTRAVENOUS
  Administered 2021-08-23 (×2): 1 mg via INTRAVENOUS

## 2021-08-23 MED ORDER — HYDROMORPHONE HCL 1 MG/ML IJ SOLN: 1.0000 mg | Freq: Once | INTRAMUSCULAR | Status: DC | PRN

## 2021-08-23 MED ORDER — ACETAMINOPHEN 325 MG PO TABS: 650.0000 mg | ORAL_TABLET | ORAL | Status: DC | PRN

## 2021-08-23 MED ORDER — FAMOTIDINE 20 MG PO TABS: 40.0000 mg | ORAL_TABLET | Freq: Once | ORAL | Status: DC | PRN

## 2021-08-23 MED ORDER — SODIUM CHLORIDE 0.9% FLUSH: 3.0000 mL | Freq: Two times a day (BID) | INTRAVENOUS | Status: DC

## 2021-08-23 MED ORDER — DIPHENHYDRAMINE HCL 50 MG/ML IJ SOLN: 50.0000 mg | Freq: Once | INTRAMUSCULAR | Status: DC | PRN

## 2021-08-23 MED ORDER — HYDRALAZINE HCL 20 MG/ML IJ SOLN: 5.0000 mg | INTRAMUSCULAR | Status: DC | PRN

## 2021-08-23 MED ORDER — SODIUM CHLORIDE 0.9 % IV SOLN: INTRAVENOUS | Status: DC

## 2021-08-23 MED ORDER — CEFAZOLIN SODIUM-DEXTROSE 2-4 GM/100ML-% IV SOLN: 2.0000 g | Freq: Once | INTRAVENOUS | Status: AC

## 2021-08-23 MED ORDER — CEFAZOLIN SODIUM-DEXTROSE 2-4 GM/100ML-% IV SOLN
INTRAVENOUS | Status: AC
  Administered 2021-08-23: 2 g via INTRAVENOUS
  Filled 2021-08-23: qty 100

## 2021-08-23 MED ORDER — MIDAZOLAM HCL 2 MG/ML PO SYRP: 8.0000 mg | ORAL_SOLUTION | Freq: Once | ORAL | Status: DC | PRN

## 2021-08-23 MED ORDER — HEPARIN SODIUM (PORCINE) 1000 UNIT/ML IJ SOLN
INTRAMUSCULAR | Status: AC
  Filled 2021-08-23: qty 10

## 2021-08-23 MED ORDER — MIDAZOLAM HCL 2 MG/2ML IJ SOLN
INTRAMUSCULAR | Status: AC
  Filled 2021-08-23: qty 4

## 2021-08-23 MED ORDER — SODIUM CHLORIDE 0.9 % IV SOLN: 250.0000 mL | INTRAVENOUS | Status: DC | PRN

## 2021-08-23 MED ORDER — METHYLPREDNISOLONE SODIUM SUCC 125 MG IJ SOLR
125.0000 mg | Freq: Once | INTRAMUSCULAR | Status: DC | PRN
Start: 2021-08-23 — End: 2021-08-23

## 2021-08-23 MED ORDER — CLOPIDOGREL BISULFATE 75 MG PO TABS: 75.0000 mg | ORAL_TABLET | Freq: Every day | ORAL | Status: DC

## 2021-08-23 MED ORDER — SODIUM CHLORIDE 0.9% FLUSH: 3.0000 mL | INTRAVENOUS | Status: DC | PRN

## 2021-08-23 MED ORDER — FENTANYL CITRATE PF 50 MCG/ML IJ SOSY
PREFILLED_SYRINGE | INTRAMUSCULAR | Status: AC
  Filled 2021-08-23: qty 2

## 2021-08-23 SURGICAL SUPPLY — 27 items
BALLN DORADO7X100X80 (BALLOONS) ×2
BALLN LUTONIX 018 5X150X130 (BALLOONS) ×2
BALLN LUTONIX 018 5X220X130 (BALLOONS) ×2
BALLN LUTONIX 018 5X300X130 (BALLOONS) ×2
BALLN LUTONIX 018 6X300X130 (BALLOONS) ×2
BALLN ULTRVRSE 3X300X150 (BALLOONS) ×2
BALLN ULTRVRSE 3X300X150 OTW (BALLOONS) ×1
BALLOON DORADO7X100X80 (BALLOONS) IMPLANT
BALLOON LUTONIX 018 5X150X130 (BALLOONS) IMPLANT
BALLOON LUTONIX 018 5X220X130 (BALLOONS) IMPLANT
BALLOON LUTONIX 018 5X300X130 (BALLOONS) IMPLANT
BALLOON LUTONIX 018 6X300X130 (BALLOONS) IMPLANT
BALLOON ULTRVRSE 3X300X150 OTW (BALLOONS) IMPLANT
CATH BEACON 5 .035 65 KMP TIP (CATHETERS) ×1 IMPLANT
CATH SEEKER .035X90 (CATHETERS) ×1 IMPLANT
DEVICE RAD COMP TR BAND LRG (VASCULAR PRODUCTS) ×1 IMPLANT
DRAPE INCISE IOBAN 66X45 STRL (DRAPES) ×1 IMPLANT
GAUZE SPONGE 4X4 12PLY STRL (GAUZE/BANDAGES/DRESSINGS) ×3 IMPLANT
GLIDESHEATH SLEND SS 6F .021 (SHEATH) ×1 IMPLANT
GLIDEWIRE ADV .035X180CM (WIRE) ×1 IMPLANT
KIT ENCORE 26 ADVANTAGE (KITS) ×1 IMPLANT
PACK ANGIOGRAPHY (CUSTOM PROCEDURE TRAY) ×2 IMPLANT
SHEATH HALO 035 5FRX10 (SHEATH) ×1 IMPLANT
SHEATH MICROPUNCTURE PEDAL 4FR (SHEATH) ×1 IMPLANT
STENT VIABAHN 6X250X120 (Permanent Stent) ×2 IMPLANT
WIRE G V18X300CM (WIRE) ×2 IMPLANT
WIRE GUIDERIGHT .035X150 (WIRE) ×1 IMPLANT

## 2021-08-23 NOTE — Op Note (Signed)
[facility]  Percutaneous Study/Intervention Procedural Note   Date of Surgery: 08/23/2021  Surgeon(s):Brannon Levene    Assistants:none  Pre-operative Diagnosis: PAD with rest pain RLE  Post-operative diagnosis:  Same  Procedure(s) Performed:             1.  Ultrasound guidance for vascular access right posterior tibial artery             2.  Catheter placement into right common femoral artery from left femoral approach             3.  Selective right lower extremity angiogram             4.  Percutaneous transluminal angioplasty of right posterior tibial artery and tibioperoneal trunk with 3 mm diameter angioplasty balloon             5.  Percutaneous transluminal angioplasty of right SFA and popliteal arteries with 5 mm diameter Lutonix drug-coated angioplasty balloons  6.  Viabahn stent placement x2 to the right SFA and popliteal arteries with a pair of 6 mm diameter by 25 cm length Viabahn stents             EBL: 3 cc  Contrast: 50 cc  Fluoro Time: 9.3 minutes  Moderate Conscious Sedation Time: approximately 67 minutes using 4 mg of Versed and 175 mcg of Fentanyl              Indications:  Patient is a 76 y.o.male with rest pain and known severe PAD. The patient has noninvasive study showing reduced flow on the right as well as a previous angiogram from a left femoral approach that we were unable to complete treatment due to hanging up on the stent edge proximally in the proximal to mid right SFA. The patient is brought in for angiography for further evaluation and potential treatment.  Due to the limb threatening nature of the situation, angiogram was performed for attempted limb salvage. The patient is aware that if the procedure fails, amputation would be expected.  The patient also understands that even with successful revascularization, amputation may still be required due to the severity of the situation.  Risks and benefits are discussed and informed  consent is obtained.   Procedure:  The patient was identified and appropriate procedural time out was performed.  The patient was then placed supine on the table and prepped and draped in the usual sterile fashion. Moderate conscious sedation was administered during a face to face encounter with the patient throughout the procedure with my supervision of the RN administering medicines and monitoring the patient's vital signs, pulse oximetry, telemetry and mental status throughout from the start of the procedure until the patient was taken to the recovery room. Ultrasound was used to evaluate the right posterior tibial artery and this was accessed without difficulty just above the ankle.  I then placed a 5 French Halo sheath in the right posterior tibial artery.  Imaging showed moderate stenosis in the proximal posterior tibial artery and tibioperoneal trunk in the 60% range with flow in the peroneal artery and anterior tibial artery at least proximally and occlusion of the stent in the distal popliteal artery.  I then used a Kumpe catheter and advantage wire and crossed the occlusion without difficulty advancing a seeker catheter up in the right common femoral artery.  Selective right lower extremity angiogram was then performed. This demonstrated occlusion of the SFA and popliteal stents.  We upsized to a 6 [place]  sheath.  We exchanged for a V 18 wire.  A 3 mm diameter by 30 cm length angioplasty balloon was inflated in the proximal posterior tibial artery and tibioperoneal trunk and then used to predilate the SFA and popliteal lesions.  The definitive treatment in the posterior tibial artery and tibioperoneal trunk was up at 8 atm for 1 minute.  I then upsized to a pair 5 mm diameter Lutonix drug-coated angioplasty balloons to treat the SFA and popliteal arteries.  There is a 30 cm length and a 22 cm length Lutonix drug-coated balloon.  These inflations were 10 to 12 atm for a minute.  Completion imaging showed  residual greater than 50% stenosis throughout much of the right SFA and popliteal artery within the previously placed stents as well as above the previous stents.  The tibioperoneal trunk and posterior tibial artery had less than 20% residual stenosis.  I elected to place a Viabahn stents and a pair of 6 mm diameter by 25 cm length Viabahn stents were deployed from just below the femoral bifurcation of the proximal SFA down to the distal popliteal artery just above the anterior tibial artery origin.  These were postdilated with 5 mm balloons distally and 6 mm balloon proximally an area in the midsegment required a 7 mm diameter high-pressure angioplasty balloon to break the waist.  Following this there was excellent flow through the SFA and popliteal arteries with preserved runoff distally.  I elected to terminate the procedure. The sheath was removed and pressure was held at the site with a TR band and manual pressure. The patient was taken to the recovery room in stable condition having tolerated the procedure well.  Findings:                            Right lower Extremity: Occlusion of the right SFA and popliteal arteries with 60% stenosis in the tibioperoneal trunk and proximal posterior tibial artery.  The peroneal artery was also patent.  The anterior tibial artery also had flow distally as well.   Disposition: Patient was taken to the recovery room in stable condition having tolerated the procedure well.  Complications: None  Leotis Pain 08/23/2021 9:42 AM   This note was created with Dragon Medical transcription system. Any errors in dictation are purely unintentional.

## 2021-08-23 NOTE — H&P (Signed)
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 Marland Kitchen  History of Present Illness: Patient presents for right lower extremity revascularization attempt today.  Was here last week from femoral approach and is having a pedal and femoral approach.  Current Facility-Administered Medications  Medication Dose Route Frequency Provider Last Rate Last Admin   0.9 %  sodium chloride infusion   Intravenous Continuous Kris Hartmann, NP       ceFAZolin (ANCEF) 2-4 GM/100ML-% IVPB            ceFAZolin (ANCEF) IVPB 2g/100 mL premix  2 g Intravenous Once Kris Hartmann, NP       diphenhydrAMINE (BENADRYL) injection 50 mg  50 mg Intravenous Once PRN Kris Hartmann, NP       famotidine (PEPCID) tablet 40 mg  40 mg Oral Once PRN Kris Hartmann, NP       fentaNYL (SUBLIMAZE) 50 MCG/ML injection            fentaNYL (SUBLIMAZE) injection    PRN Algernon Huxley, MD   50 mcg at 08/23/21 0813   heparin sodium (porcine) 1000 UNIT/ML injection            HYDROmorphone (DILAUDID) injection 1 mg  1 mg Intravenous Once PRN Kris Hartmann, NP       methylPREDNISolone sodium succinate (SOLU-MEDROL) 125 mg/2 mL injection 125 mg  125 mg Intravenous Once PRN Kris Hartmann, NP       midazolam (VERSED) 2 MG/2ML injection            midazolam (VERSED) 2 MG/ML syrup 8 mg  8 mg Oral Once PRN Kris Hartmann, NP       midazolam (VERSED) injection    PRN Algernon Huxley, MD   2 mg at 08/23/21 0813   ondansetron (ZOFRAN) injection 4 mg  4 mg Intravenous Q6H PRN Kris Hartmann, NP        Past Medical History:  Diagnosis Date   Allergy    Anginal pain ([place])    BPH (benign prostatic hypertrophy) 6/07   CAD (coronary artery disease) 2010   dx made by coronary calcium scoring, Normal stress test in 2010.    GERD (gastroesophageal reflux disease) 12/05   HLD (hyperlipidemia)  3/99   HTN (hypertension) 2002   no meds   Left rotator cuff tear 08/12/2011   Per Dr. Mardelle Matte 2013 L rotator cuff tear on MRI 2013    Right rotator cuff tear 05/24/2013   Wears glasses     Past Surgical History:  Procedure Laterality Date   admitted to [facility] chest pain  12/15/05   Dr. Clayborn Bigness   BLEPHAROPLASTY  10/11   Dr. Loletta Specter    bone spur removed Right    COLONOSCOPY  09/24/04   nml; Dr. Epifanio Lesches    CORONARY ARTERY BYPASS GRAFT N/A 05/29/2020   Procedure: CORONARY ARTERY BYPASS GRAFTING (CABG) TIMES THREE USING LIMA to LAD; ENDOSCOPIC HARVESTED RIGHT GREATER SAPHENOUS VEIN: SVG to PD; SVG to RAMUS.;  Surgeon: Grace Isaac, MD;  Location: [facility];  Service: Open Heart Surgery;  Laterality: N/A;   EGD esophagitis by bx  01/26/06   gastritis bx neg h-pylori   ENDOVEIN HARVEST OF GREATER SAPHENOUS VEIN Right 05/29/2020   Procedure: ENDOVEIN HARVEST OF GREATER SAPHENOUS VEIN;  Surgeon: Servando Snare,  Lilia Argue, MD;  Location: [facility];  Service: Open Heart Surgery;  Laterality: Right;   LEFT HEART CATH AND CORONARY ANGIOGRAPHY N/A 05/26/2020   Procedure: LEFT HEART CATH AND CORONARY ANGIOGRAPHY;  Surgeon: Corey Skains, MD;  Location: [facility];  Service: Cardiovascular;  Laterality: N/A;   LOWER EXTREMITY ANGIOGRAPHY Right 10/01/2020   Procedure: LOWER EXTREMITY ANGIOGRAPHY;  Surgeon: Algernon Huxley, MD;  Location: [facility];  Service: Cardiovascular;  Laterality: Right;   LOWER EXTREMITY ANGIOGRAPHY Right 08/12/2021   Procedure: LOWER EXTREMITY ANGIOGRAPHY;  Surgeon: Algernon Huxley, MD;  Location: [facility];  Service: Cardiovascular;  Laterality: Right;   POLYPECTOMY     right and left prostate needle bx  05/02/03   acute inflammation; Dr. Gaynelle Arabian    SHOULDER ARTHROSCOPY W/ ROTATOR CUFF REPAIR  12/2011   left   SHOULDER ARTHROSCOPY WITH ROTATOR CUFF REPAIR AND SUBACROMIAL DECOMPRESSION Right 05/24/2013   Procedure: RIGHT SHOULDER ARTHROSCOPY WITH ROTATOR CUFF  REPAIR AND SUBACROMIAL DECOMPRESSION AND PARTIAL ACROMIOPLSTY WITH CORACOACROMIAL RELEASE, DISTAL CLAVICULECTOMY, LABRIAL DEBRIDEMENT;  Surgeon: Johnny Bridge, MD;  Location: [facility];  Service: Orthopedics;  Laterality: Right;   spect ETT nml  12/16/05   TEE WITHOUT CARDIOVERSION N/A 05/29/2020   Procedure: TRANSESOPHAGEAL ECHOCARDIOGRAM (TEE);  Surgeon: Grace Isaac, MD;  Location: [facility];  Service: Open Heart Surgery;  Laterality: N/A;   UPPER GASTROINTESTINAL ENDOSCOPY     WISDOM TOOTH EXTRACTION       Social History   Tobacco Use   Smoking status: Never   Smokeless tobacco: Never  Vaping Use   Vaping Use: Never used  Substance Use Topics   Alcohol use: Yes    Alcohol/week: 0.0 standard drinks    Comment: beer, occ   Drug use: No     Family History  Problem Relation Age of Onset   Stroke Mother    Glaucoma Other        grandmother at 83   Barrett's esophagus Maternal Uncle    Prostate cancer Neg Hx    Colon cancer Neg Hx    Colon polyps Neg Hx    Esophageal cancer Neg Hx    Rectal cancer Neg Hx    Stomach cancer Neg Hx     Allergies  Allergen Reactions   Ciprofloxacin Hcl Rash   Quinolones Rash     REVIEW OF SYSTEMS (Negative unless checked)  Constitutional: [] Weight loss  [] Fever  [] Chills Cardiac: [] Chest pain   [] Chest pressure   [] Palpitations   [] Shortness of breath when laying flat   [] Shortness of breath at rest   [] Shortness of breath with exertion. Vascular:  [x] Pain in legs with walking   [] Pain in legs at rest   [] Pain in legs when laying flat   [x] Claudication   [] Pain in feet when walking  [x] Pain in feet at rest  [] Pain in feet when laying flat   [] History of DVT   [] Phlebitis   [] Swelling in legs   [] Varicose veins   [] Non-healing ulcers Pulmonary:   [] Uses home oxygen   [] Productive cough   [] Hemoptysis   [] Wheeze  [] COPD   [] Asthma Neurologic:  [] Dizziness  [] Blackouts   [] Seizures   [] History of stroke   [] History of TIA   [] Aphasia   [] Temporary blindness   [] Dysphagia   [] Weakness or numbness in arms   [] Weakness or numbness in legs Musculoskeletal:  [x] Arthritis   [] Joint swelling   [x] Joint pain   []   Low back pain Hematologic:  [] Easy bruising  [] Easy bleeding   [] Hypercoagulable state   [] Anemic  [] Hepatitis Gastrointestinal:  [] Blood in stool   [] Vomiting blood  [x] Gastroesophageal reflux/heartburn   [] Difficulty swallowing. Genitourinary:  [] Chronic kidney disease   [] Difficult urination  [] Frequent urination  [] Burning with urination   [] Blood in urine Skin:  [] Rashes   [] Ulcers   [] Wounds Psychological:  [] History of anxiety   []  History of major depression.  Physical Examination  Vitals:   08/23/21 0722 08/23/21 0814 08/23/21 0818  BP: (!) 162/84    Pulse: 64    Resp: 15    Temp: 98.2 F (36.8 C)    TempSrc: Oral    SpO2: 98% 98% 97%  Weight: 86.2 kg    Height: 5\' 6"  (1.676 m)     Body mass index is 30.67 kg/m. Gen: WD/WN, NAD Head: [place]/AT, No temporalis wasting.  Ear/Nose/Throat: Hearing grossly intact, nares w/o erythema or drainage, oropharynx w/o Erythema/Exudate,  Eyes: Conjunctiva clear, sclera non-icteric Neck: Trachea midline.  No JVD.  Pulmonary:  Good air movement, respirations not labored, no use of accessory muscles.  Cardiac: RRR, normal S1, S2. Vascular:  Vessel Right Left  Radial Palpable Palpable                          PT 1+ palpable 1+ palpable  DP Trace palpable 1+ palpable    Musculoskeletal: M/S 5/5 throughout.  Extremities without ischemic changes.  No deformity or atrophy.  Neurologic: Sensation grossly intact in extremities.  Symmetrical.  Speech is fluent. Motor exam as listed above. Psychiatric: Judgment intact, Mood & affect appropriate for pt's clinical situation. Dermatologic: No rashes or ulcers noted.  No cellulitis or open wounds.      CBC Lab Results  Component Value Date   WBC 9.5 06/01/2020   HGB 10.8 (L) 06/01/2020   HCT 32.9 (L)  06/01/2020   MCV 94.8 06/01/2020   PLT 141 (L) 06/01/2020    BMET    Component Value Date/Time   NA 140 06/02/2020 0824   NA 140 05/12/2012 1240   K 3.9 06/02/2020 0824   K 4.2 05/12/2012 1240   CL 106 06/02/2020 0824   CL 107 05/12/2012 1240   CO2 25 06/02/2020 0824   CO2 26 05/12/2012 1240   GLUCOSE 102 (H) 06/02/2020 0824   GLUCOSE 101 (H) 05/12/2012 1240   BUN 25 (H) 08/23/2021 0736   BUN 21 (H) 05/12/2012 1240   CREATININE 0.97 08/23/2021 0736   CREATININE 0.86 05/12/2012 1240   CALCIUM 8.3 (L) 06/02/2020 0824   CALCIUM 8.9 05/12/2012 1240   GFRNONAA >60 08/23/2021 0736   GFRNONAA >60 05/12/2012 1240   GFRAA >60 05/12/2012 1240   Estimated Creatinine Clearance: 67.8 mL/min (by C-G formula based on SCr of 0.97 mg/dL).  COAG Lab Results  Component Value Date   INR 1.4 (H) 05/29/2020   INR 1.1 05/28/2020    Radiology PERIPHERAL VASCULAR CATHETERIZATION  Result Date: 08/12/2021 See surgical note for result.    Assessment/Plan 1.  Atherosclerosis with rest pain.  For an attempt at revascularization from both the pedal and femoral approach after being unable to complete the procedure from a pure femoral approach the week before.  Risks and benefits discussed. 2.  Hypertension.  Stable on outpatient medications and blood pressure control important in reducing the progression of atherosclerotic disease. On appropriate oral medications.    Leotis Pain, MD  08/23/2021 8:18 AM

## 2021-08-27 ENCOUNTER — Telehealth (INDEPENDENT_AMBULATORY_CARE_PROVIDER_SITE_OTHER): Payer: Self-pay

## 2021-08-31 NOTE — Telephone Encounter (Signed)
Patient was made aware with medical advice and verbalized understanding 

## 2021-08-31 NOTE — Telephone Encounter (Signed)
Sorry on the delay in response, I thought I had but then there was an error.  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  That tends to subside in time.  The cramps are not uncommon either.  What would worry me more is if the leg was cold or there was unrelenting pain.  If he's tried some tylenol and that isn't helpful for the discomfort, we can send in something stronger

## 2021-09-16 ENCOUNTER — Other Ambulatory Visit: Payer: Self-pay

## 2021-09-16 ENCOUNTER — Ambulatory Visit (INDEPENDENT_AMBULATORY_CARE_PROVIDER_SITE_OTHER): Payer: 59

## 2021-09-16 ENCOUNTER — Other Ambulatory Visit (INDEPENDENT_AMBULATORY_CARE_PROVIDER_SITE_OTHER): Payer: Self-pay | Admitting: Vascular Surgery

## 2021-09-16 ENCOUNTER — Ambulatory Visit (INDEPENDENT_AMBULATORY_CARE_PROVIDER_SITE_OTHER): Payer: 59 | Admitting: Nurse Practitioner

## 2021-09-16 ENCOUNTER — Encounter (INDEPENDENT_AMBULATORY_CARE_PROVIDER_SITE_OTHER): Payer: Self-pay | Admitting: Nurse Practitioner

## 2021-09-16 VITALS — BP 150/87 | HR 65 | Resp 16 | Wt 190.0 lb

## 2021-09-16 DIAGNOSIS — E782 Mixed hyperlipidemia: Secondary | ICD-10-CM

## 2021-09-16 DIAGNOSIS — I10 Essential (primary) hypertension: Secondary | ICD-10-CM | POA: Diagnosis not present

## 2021-09-16 DIAGNOSIS — I70221 Atherosclerosis of native arteries of extremities with rest pain, right leg: Secondary | ICD-10-CM

## 2021-09-16 DIAGNOSIS — Z95820 Peripheral vascular angioplasty status with implants and grafts: Secondary | ICD-10-CM

## 2021-09-16 DIAGNOSIS — Z9889 Other specified postprocedural states: Secondary | ICD-10-CM

## 2021-09-16 DIAGNOSIS — I739 Peripheral vascular disease, unspecified: Secondary | ICD-10-CM

## 2021-09-27 ENCOUNTER — Encounter (INDEPENDENT_AMBULATORY_CARE_PROVIDER_SITE_OTHER): Payer: Self-pay | Admitting: Nurse Practitioner

## 2021-09-27 NOTE — Progress Notes (Signed)
Subjective:    Patient ID: Clayton Powell, Dr., male    DOB: 16-Sep-1945, 76 y.o.   MRN: [id number] Chief Complaint  Patient presents with   Follow-up    [facility] 4 week ultrasound follow up    Clayton Powell is a 76 year old male that presents today post angiogram of his right lower extremity.  Claudication symptoms have improved however the patient notes some swelling and discomfort from the swelling.  The patient underwent the initial intervention however the lesion was unable to be crossed therefore a subsequent intervention with pedal approach was necessary.  He remains compliant on his medications.  Today noninvasive studies show an ABI of 1.22 on the right and 1.29 on the left.  The patient has strong biphasic tibial artery waveforms with biphasic/triphasic waveforms on the left lower extremity.  He has good toe waveforms bilaterally.  Previous ABI was 0.70 on the right and 1.16 on the left.   Review of Systems  Cardiovascular:  Positive for leg swelling.  All other systems reviewed and are negative.     Objective:   Physical Exam Vitals reviewed.  HENT:     Head: Normocephalic.  Cardiovascular:     Rate and Rhythm: Normal rate.  Pulmonary:     Effort: Pulmonary effort is normal.  Skin:    General: Skin is warm and dry.  Neurological:     Mental Status: He is alert and oriented to person, place, and time.  Psychiatric:        Mood and Affect: Mood normal.        Behavior: Behavior normal.        Thought Content: Thought content normal.        Judgment: Judgment normal.    BP (!) 150/87 (BP Location: Left Arm)    Pulse 65    Resp 16    Wt 190 lb (86.2 kg)    BMI 30.67 kg/m   Past Medical History:  Diagnosis Date   Allergy    Anginal pain (HCC)    BPH (benign prostatic hypertrophy) 6/07   CAD (coronary artery disease) 2010   dx made by coronary calcium scoring, Normal stress test in 2010.    GERD (gastroesophageal reflux disease) 12/05   HLD  (hyperlipidemia) 3/99   HTN (hypertension) 2002   no meds   Left rotator cuff tear 08/12/2011   Per Dr. Mardelle Matte 2013 L rotator cuff tear on MRI 2013    Right rotator cuff tear 05/24/2013   Wears glasses     Social History   Socioeconomic History   Marital status: Married    Spouse name: Clayton Powell   Number of children: 2   Years of education: Not on file   Highest education level: Not on file  Occupational History   Not on file  Tobacco Use   Smoking status: Never   Smokeless tobacco: Never  Vaping Use   Vaping Use: Never used  Substance and Sexual Activity   Alcohol use: Yes    Alcohol/week: 0.0 standard drinks    Comment: beer, occ   Drug use: No   Sexual activity: Yes  Other Topics Concern   Not on file  Social History Narrative   Married, 2 children - one is local   Married 40+ years   [place]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    Plays guitar   Works out regularly.   Social Determinants of Health   Financial Resource Strain: Not on file  Food Insecurity:  Not on file  Transportation Needs: Not on file  Physical Activity: Not on file  Stress: Not on file  Social Connections: Not on file  Intimate Partner Violence: Not on file    Past Surgical History:  Procedure Laterality Date   admitted to [facility] chest pain  12/15/05   Dr. Clayborn Bigness   BLEPHAROPLASTY  10/11   Dr. Loletta Specter    bone spur removed Right    COLONOSCOPY  09/24/04   nml; Dr. Epifanio Lesches    CORONARY ARTERY BYPASS GRAFT N/A 05/29/2020   Procedure: CORONARY ARTERY BYPASS GRAFTING (CABG) TIMES THREE USING LIMA to LAD; ENDOSCOPIC HARVESTED RIGHT GREATER SAPHENOUS VEIN: SVG to PD; SVG to RAMUS.;  Surgeon: Grace Isaac, MD;  Location: [place];  Service: Open Heart Surgery;  Laterality: N/A;   EGD esophagitis by bx  01/26/06   gastritis bx neg h-pylori   ENDOVEIN HARVEST OF GREATER SAPHENOUS VEIN Right 05/29/2020   Procedure: ENDOVEIN HARVEST OF GREATER SAPHENOUS VEIN;  Surgeon: Grace Isaac, MD;  Location: [place];  Service: Open Heart Surgery;  Laterality: Right;   LEFT HEART CATH AND CORONARY ANGIOGRAPHY N/A 05/26/2020   Procedure: LEFT HEART CATH AND CORONARY ANGIOGRAPHY;  Surgeon: Corey Skains, MD;  Location: [place] [facility];  Service: Cardiovascular;  Laterality: N/A;   LOWER EXTREMITY ANGIOGRAPHY Right 10/01/2020   Procedure: LOWER EXTREMITY ANGIOGRAPHY;  Surgeon: Algernon Huxley, MD;  Location: [place] [facility];  Service: Cardiovascular;  Laterality: Right;   LOWER EXTREMITY ANGIOGRAPHY Right 08/12/2021   Procedure: LOWER EXTREMITY ANGIOGRAPHY;  Surgeon: Algernon Huxley, MD;  Location: [place] [facility];  Service: Cardiovascular;  Laterality: Right;   LOWER EXTREMITY ANGIOGRAPHY Right 08/23/2021   Procedure: LOWER EXTREMITY ANGIOGRAPHY;  Surgeon: Algernon Huxley, MD;  Location: [place] [facility];  Service: Cardiovascular;  Laterality: Right;   POLYPECTOMY     right and left prostate needle bx  05/02/03   acute inflammation; Dr. Gaynelle Arabian    SHOULDER ARTHROSCOPY W/ ROTATOR CUFF REPAIR  12/2011   left   SHOULDER ARTHROSCOPY WITH ROTATOR CUFF REPAIR AND SUBACROMIAL DECOMPRESSION Right 05/24/2013   Procedure: RIGHT SHOULDER ARTHROSCOPY WITH ROTATOR CUFF REPAIR AND SUBACROMIAL DECOMPRESSION AND PARTIAL ACROMIOPLSTY WITH CORACOACROMIAL RELEASE, DISTAL CLAVICULECTOMY, LABRIAL DEBRIDEMENT;  Surgeon: Johnny Bridge, MD;  Location: [place];  Service: Orthopedics;  Laterality: Right;   spect ETT nml  12/16/05   TEE WITHOUT CARDIOVERSION N/A 05/29/2020   Procedure: TRANSESOPHAGEAL ECHOCARDIOGRAM (TEE);  Surgeon: Grace Isaac, MD;  Location: [place];  Service: Open Heart Surgery;  Laterality: N/A;   UPPER GASTROINTESTINAL ENDOSCOPY     WISDOM TOOTH EXTRACTION      Family History  Problem Relation Age of Onset   Stroke Mother    Glaucoma Other        grandmother at 45   Barrett's esophagus Maternal Uncle    Prostate cancer Neg Hx     Colon cancer Neg Hx    Colon polyps Neg Hx    Esophageal cancer Neg Hx    Rectal cancer Neg Hx    Stomach cancer Neg Hx     Allergies  Allergen Reactions   Ciprofloxacin Hcl Rash   Quinolones Rash    CBC Latest Ref Rng & Units 06/01/2020 05/31/2020 05/30/2020  WBC 4.0 - 10.5 K/uL 9.5 9.5 10.2  Hemoglobin 13.0 - 17.0 g/dL 10.8(L) 10.1(L) 11.5(L)  Hematocrit 39.0 - 52.0 % 32.9(L) 31.4(L) 35.3(L)  Platelets 150 - 400 K/uL  141(L) 117(L) 132(L)      CMP     Component Value Date/Time   NA 140 06/02/2020 0824   NA 140 05/12/2012 1240   K 3.9 06/02/2020 0824   K 4.2 05/12/2012 1240   CL 106 06/02/2020 0824   CL 107 05/12/2012 1240   CO2 25 06/02/2020 0824   CO2 26 05/12/2012 1240   GLUCOSE 102 (H) 06/02/2020 0824   GLUCOSE 101 (H) 05/12/2012 1240   BUN 25 (H) 08/23/2021 0736   BUN 21 (H) 05/12/2012 1240   CREATININE 0.97 08/23/2021 0736   CREATININE 0.86 05/12/2012 1240   CALCIUM 8.3 (L) 06/02/2020 0824   CALCIUM 8.9 05/12/2012 1240   PROT 7.1 05/25/2020 1715   PROT 7.4 01/17/2012 0807   ALBUMIN 4.4 05/25/2020 1715   ALBUMIN 3.9 01/17/2012 0807   AST 34 05/25/2020 1715   AST 57 (H) 01/17/2012 0807   ALT 29 05/25/2020 1715   ALT 49 01/17/2012 0807   ALKPHOS 73 05/25/2020 1715   ALKPHOS 74 01/17/2012 0807   BILITOT 0.8 05/25/2020 1715   BILITOT 0.8 01/17/2012 0807   GFRNONAA >60 08/23/2021 0736   GFRNONAA >60 05/12/2012 1240   GFRAA >60 05/12/2012 1240     VAS [place] ABI WITH/WO TBI  Result Date: 09/20/2021  LOWER EXTREMITY DOPPLER STUDY Patient Name:  SANAD FEARNOW  Date of Exam:   09/16/2021 Medical Rec #: [id number]         Accession #:    [id number] Date of Birth: 07-15-1946        Patient Gender: M Patient Age:   42 years Exam Location:  [facility] Procedure:      VAS [place] ABI WITH/WO TBI Referring Phys: Corene Cornea DEW --------------------------------------------------------------------------------  Indications: Peripheral artery disease.  Vascular  Interventions: 08/23/21: Right SFA/popliteal stent x2;. Performing Technologist: Blondell Reveal RT, RDMS, RVT  Examination Guidelines: A complete evaluation includes at minimum, Doppler waveform signals and systolic blood pressure reading at the level of bilateral brachial, anterior tibial, and posterior tibial arteries, when vessel segments are accessible. Bilateral testing is considered an integral part of a complete examination. Photoelectric Plethysmograph (PPG) waveforms and toe systolic pressure readings are included as required and additional duplex testing as needed. Limited examinations for reoccurring indications may be performed as noted.  ABI Findings: +---------+------------------+-----+--------+--------+  Right     Rt Pressure (mmHg) Index Waveform Comment   +---------+------------------+-----+--------+--------+  Brachial  168                                         +---------+------------------+-----+--------+--------+  ATA       201                1.20  biphasic           +---------+------------------+-----+--------+--------+  PTA       205                1.22  biphasic           +---------+------------------+-----+--------+--------+  Great Toe 134                0.80  Normal             +---------+------------------+-----+--------+--------+ +---------+------------------+-----+---------+-------+  Left      Lt Pressure (mmHg) Index Waveform  Comment  +---------+------------------+-----+---------+-------+  Brachial  162                                         +---------+------------------+-----+---------+-------+  ATA       179                1.07  biphasic           +---------+------------------+-----+---------+-------+  PTA       217                1.29  triphasic          +---------+------------------+-----+---------+-------+  Great Toe 146                0.87  Normal             +---------+------------------+-----+---------+-------+ +-------+-----------+-----------+------------+------------+   ABI/TBI Today's ABI Today's TBI Previous ABI Previous TBI  +-------+-----------+-----------+------------+------------+  Right   1.22        0.80        0.70         0.67          +-------+-----------+-----------+------------+------------+  Left    1.29        0.87        1.16         1.23          +-------+-----------+-----------+------------+------------+ Right ABIs and TBIs appear increased compared to prior study on 07/23/21. Left ABIs appear essentially unchanged.  Summary: Right: Resting right ankle-brachial index is within normal range. No evidence of significant right lower extremity arterial disease. The right toe-brachial index is normal. Left: Resting left ankle-brachial index is within normal range. No evidence of significant left lower extremity arterial disease. The left toe-brachial index is normal.  *See table(s) above for measurements and observations.  Electronically signed by Leotis Pain MD on 09/20/2021 at 12:18:06 PM.    Final        Assessment & Plan:   1. Peripheral arterial disease with history of revascularization (HCC)  Recommend:  The patient has evidence of atherosclerosis of the lower extremities.  The patient does not voice lifestyle limiting changes at this point in time.  Noninvasive studies suggest improvement post angiogram  No invasive studies, angiography or surgery at this time The patient should continue walking and begin a more formal exercise program.  The patient should continue antiplatelet therapy and aggressive treatment of the lipid abnormalities  No changes in the patient's medications at this time  The patient should continue wearing graduated compression socks 10-15 mmHg strength to control the mild edema.    We will also look into the PAD clinic as well as the referral criteria for the patient. 2. Primary hypertension Continue antihypertensive medications as already ordered, these medications have been reviewed and there are no changes at this  time.   3. Hyperlipidemia, mixed Continue statin as ordered and reviewed, no changes at this time    Current Outpatient Medications on File Prior to Visit  Medication Sig Dispense Refill   aspirin EC 81 MG tablet Take 81 mg by mouth daily. Swallow whole.     Azelastine-Fluticasone 137-50 MCG/ACT SUSP Place 1 spray into both nostrils 2 (two) times daily.     Cholecalciferol (VITAMIN D) 2000 UNITS CAPS Take 1 capsule by mouth daily.     clopidogrel (PLAVIX) 75 MG tablet Take 1 tablet (75 mg total) by mouth daily. 30 tablet 11   Cyanocobalamin (VITAMIN B-12) 5000 MCG SUBL Place 1 tablet under the tongue daily.     fexofenadine (ALLEGRA) 180 MG tablet Take 1 tablet by mouth daily as needed.     finasteride (PROSCAR) 5 MG tablet Take  1 tablet (5 mg total) by mouth daily. 90 tablet 3   icosapent Ethyl (VASCEPA) 1 g capsule Take 1 tablet by mouth 4 (four) times daily.     montelukast (SINGULAIR) 10 MG tablet Take by mouth.     Multiple Vitamin (MULTIVITAMIN) tablet Take 1 tablet by mouth daily.     rosuvastatin (CRESTOR) 40 MG tablet Take 40 mg by mouth daily.     tadalafil (CIALIS) 5 MG tablet Take by mouth.     calcium citrate (CALCITRATE - DOSED IN MG ELEMENTAL CALCIUM) 950 (200 Ca) MG tablet Take 1 tablet by mouth 2 (two) times daily. (Patient not taking: Reported on 08/12/2021)     No current facility-administered medications on file prior to visit.    There are no Patient Instructions on file for this visit. No follow-ups on file.   Kris Hartmann, NP

## 2021-10-01 ENCOUNTER — Encounter (INDEPENDENT_AMBULATORY_CARE_PROVIDER_SITE_OTHER): Payer: Self-pay

## 2021-10-04 LAB — LAB REPORT - SCANNED: EGFR: 65

## 2021-11-11 ENCOUNTER — Ambulatory Visit: Payer: 59 | Admitting: Internal Medicine

## 2021-11-11 ENCOUNTER — Encounter: Payer: Self-pay | Admitting: Internal Medicine

## 2021-11-11 VITALS — BP 172/106 | HR 74 | Ht 67.0 in | Wt 191.0 lb

## 2021-11-11 DIAGNOSIS — E782 Mixed hyperlipidemia: Secondary | ICD-10-CM

## 2021-11-11 DIAGNOSIS — Z951 Presence of aortocoronary bypass graft: Secondary | ICD-10-CM | POA: Diagnosis not present

## 2021-11-11 DIAGNOSIS — I739 Peripheral vascular disease, unspecified: Secondary | ICD-10-CM

## 2021-11-11 DIAGNOSIS — I2510 Atherosclerotic heart disease of native coronary artery without angina pectoris: Secondary | ICD-10-CM

## 2021-11-11 NOTE — Progress Notes (Signed)
? ? ?LIPID CLINIC CONSULT NOTE ? ?Chief Complaint:  ?Evaluate dyslipidemia ? ?Primary Care Physician: ?Tonia Ghent, MD ? ?Primary Cardiologist:  ?Corey Skains, MD ? 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  He has been heavily involved in the lipoprotein NMR testing and has previously been followed by Dr. Rulon Eisenmenger.  He has a history of coronary artery disease with CABG in 2021, followed by Dr. Nehemiah Massed with the [facility] clinic.  He also has PAD with peripheral stents followed by Dr. Leotis Pain.  He is currently on rosuvastatin 40 mg daily and will recently Vascepa 2 g twice daily.  He also takes aspirin 81 mg daily and clopidogrel 75 mg daily.  He provided lab work that was drawn on September 30, 2021, demonstrating total cholesterol 100, triglycerides 101, HDL 50 and LDL 31.  He had a lipid NMR performed back in November 2022 which showed an LDL-P less than 300, LDL-see if 30, HDL-C43, triglycerides 154 and total cholesterol 99.  HDL-P of 34 and small LDL P was undetectable (<90 nmol/L).  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  This is of course a significant risk factor for cardiovascular disease with a low prevalence.  Currently reports no chest pain or worsening shortness of breath.  He tries to walk regularly and says he plays golf. ? ?PMHx:  ?Past Medical History:  ?Diagnosis Date  ? Allergy   ? Anginal pain ([place])   ? BPH (benign prostatic hypertrophy) 6/07  ? CAD (coronary artery disease) 2010  ? dx made by coronary calcium scoring, Normal stress test in 2010.   ? GERD (gastroesophageal reflux disease) 12/05  ? HLD (hyperlipidemia) 3/99  ? HTN (hypertension) 2002  ? no meds  ? Left rotator cuff tear 08/12/2011  ? Per Dr. Mardelle Matte 2013 L rotator cuff tear on MRI 2013   ? Right  rotator cuff tear 05/24/2013  ? Wears glasses   ? ? ?Past Surgical History:  ?Procedure Laterality Date  ? admitted to [facility] chest pain  12/15/05  ? Dr. Clayborn Bigness  ? BLEPHAROPLASTY  10/11  ? Dr. Loletta Specter   ? bone spur removed Right   ? COLONOSCOPY  09/24/04  ? nml; Dr. Epifanio Lesches   ? [place] GRAFT N/A 05/29/2020  ? Procedure: CORONARY ARTERY BYPASS GRAFTING (CABG) TIMES THREE USING LIMA to LAD; ENDOSCOPIC HARVESTED RIGHT GREATER SAPHENOUS VEIN: SVG to PD; SVG to RAMUS.;  Surgeon: Grace Isaac, MD;  Location: [place];  Service: Open Heart Surgery;  Laterality: N/A;  ? EGD esophagitis by bx  01/26/06  ? gastritis bx neg h-pylori  ? ENDOVEIN HARVEST OF GREATER SAPHENOUS VEIN Right 05/29/2020  ? Procedure: ENDOVEIN HARVEST OF GREATER SAPHENOUS VEIN;  Surgeon: Grace Isaac, MD;  Location: [place];  Service: Open Heart Surgery;  Laterality: Right;  ? LEFT HEART CATH AND CORONARY ANGIOGRAPHY N/A 05/26/2020  ? Procedure: LEFT HEART CATH AND CORONARY ANGIOGRAPHY;  Surgeon: Corey Skains, MD;  Location: [place] [facility];  Service: Cardiovascular;  Laterality: N/A;  ? LOWER EXTREMITY ANGIOGRAPHY Right 10/01/2020  ? Procedure: LOWER EXTREMITY ANGIOGRAPHY;  Surgeon: Algernon Huxley, MD;  Location: [place] [facility];  Service: Cardiovascular;  Laterality: Right;  ? LOWER EXTREMITY  ANGIOGRAPHY Right 08/12/2021  ? Procedure: LOWER EXTREMITY ANGIOGRAPHY;  Surgeon: Algernon Huxley, MD;  Location: [place] [facility];  Service: Cardiovascular;  Laterality: Right;  ? LOWER EXTREMITY ANGIOGRAPHY Right 08/23/2021  ? Procedure: LOWER EXTREMITY ANGIOGRAPHY;  Surgeon: Algernon Huxley, MD;  Location: [place] [facility];  Service: Cardiovascular;  Laterality: Right;  ? POLYPECTOMY    ? right and left prostate needle bx  05/02/03  ? acute inflammation; Dr. Gaynelle Arabian   ? SHOULDER ARTHROSCOPY W/ ROTATOR CUFF REPAIR  12/2011  ? left  ? SHOULDER ARTHROSCOPY WITH ROTATOR CUFF REPAIR AND SUBACROMIAL DECOMPRESSION Right 05/24/2013  ?  Procedure: RIGHT SHOULDER ARTHROSCOPY WITH ROTATOR CUFF REPAIR AND SUBACROMIAL DECOMPRESSION AND PARTIAL ACROMIOPLSTY WITH CORACOACROMIAL RELEASE, DISTAL CLAVICULECTOMY, LABRIAL DEBRIDEMENT;  Surgeon: Johnny Bridge, MD;  Location: [place];  Service: Orthopedics;  Laterality: Right;  ? spect ETT nml  12/16/05  ? TEE WITHOUT CARDIOVERSION N/A 05/29/2020  ? Procedure: TRANSESOPHAGEAL ECHOCARDIOGRAM (TEE);  Surgeon: Grace Isaac, MD;  Location: [place];  Service: Open Heart Surgery;  Laterality: N/A;  ? UPPER GASTROINTESTINAL ENDOSCOPY    ? WISDOM TOOTH EXTRACTION    ? ? ?FAMHx:  ?Family History  ?Problem Relation Age of Onset  ? Stroke Mother   ? Glaucoma Other   ?     grandmother at 50  ? Barrett's esophagus Maternal Uncle   ? Prostate cancer Neg Hx   ? Colon cancer Neg Hx   ? Colon polyps Neg Hx   ? Esophageal cancer Neg Hx   ? Rectal cancer Neg Hx   ? Stomach cancer Neg Hx   ? ? ?SOCHx:  ? reports that he has never smoked. He has never used smokeless tobacco. He reports current alcohol use. He reports that he does not use drugs. ? ?ALLERGIES:  ?Allergies  ?Allergen Reactions  ? Ciprofloxacin Hcl Rash  ? Quinolones Rash  ? ? ?ROS: ?Pertinent items noted in HPI and remainder of comprehensive ROS otherwise negative. ? ?HOME MEDS: ?Current Outpatient Medications on File Prior to Visit  ?Medication Sig Dispense Refill  ? aspirin EC 81 MG tablet Take 81 mg by mouth daily. Swallow whole.    ? Azelastine-Fluticasone 137-50 MCG/ACT SUSP Place 1 spray into both nostrils 2 (two) times daily.    ? Cholecalciferol (VITAMIN D) 2000 UNITS CAPS Take 1 capsule by mouth daily.    ? clopidogrel (PLAVIX) 75 MG tablet Take 1 tablet (75 mg total) by mouth daily. 30 tablet 11  ? Cyanocobalamin (VITAMIN B-12) 5000 MCG SUBL Place 1 tablet under the tongue daily.    ? icosapent Ethyl (VASCEPA) 1 g capsule Take 1 tablet by mouth 4 (four) times daily.    ? meloxicam (MOBIC) 15 MG tablet Take 15 mg by mouth as needed.    ?  montelukast (SINGULAIR) 10 MG tablet Take by mouth.    ? rosuvastatin (CRESTOR) 40 MG tablet Take 40 mg by mouth daily.    ? tadalafil (CIALIS) 5 MG tablet Take by mouth.    ? Turmeric (QC TUMERIC COMPLEX PO) Take by mouth.    ? ?No current facility-administered medications on file prior to visit.  ? ? ?LABS/IMAGING: ?No results found for this or any previous visit (from the past 48 hour(s)). ?No results found. ? ?LIPID PANEL: ?   ?Component Value Date/Time  ? CHOL 143 05/26/2020 0615  ? TRIG 141 05/26/2020 0615  ? HDL 52 05/26/2020 0615  ? CHOLHDL 2.8 05/26/2020 0615  ?  VLDL 28 05/26/2020 0615  ? [place] 63 05/26/2020 0615  ? ? ?WEIGHTS: ?Wt Readings from Last 3 Encounters:  ?11/11/21 191 lb (86.6 kg)  ?09/16/21 190 lb (86.2 kg)  ?08/23/21 190 lb 0.6 oz (86.2 kg)  ? ? ?VITALS: ?BP (!) 172/106   Pulse 74   Ht '5\' 7"'$  (1.702 m)   Wt 191 lb (86.6 kg)   SpO2 95%   BMI 29.91 kg/m?  ? ?EXAM: ?Deferred ? ?EKG: ?Deferred ? 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 ?PLAN: ?1.   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  This is a significant cardiovascular risk factor and likely explains his extensive coronary and peripheral arterial disease.  He is currently on high intensity statin (rosuvastatin 40 mg daily) and Vascepa 2 g twice daily.  His current lipid profile lipid NMR show excellent control.  He reports being previously tested for LP(a) which was negative and given his very low LDL fraction, probably would not be of consequence.  I do not think further testing would alter his clinical management.  Since he already has genetic confirmation of dysbetalipoproteinemia, additional genetic testing would probably not be of further benefit.  I agree with his current management plan.  He has previously seen Dr. Rulon Eisenmenger and wishes to follow-up with him regarding his lipids and  his primary cardiologist.  I am happy to see him back as needed.  Of note his blood pressure is very elevated today however he mentioned that he has whitecoat hypertension and that his home blood pressures are better

## 2021-11-11 NOTE — Patient Instructions (Signed)

## 2021-12-03 IMAGING — CR DG CHEST 2V
2 series · 2 of 2 positions shown · non-contrast
Comparison: Chest x-ray 12/15/2005

CLINICAL DATA: Chest pain

EXAM:
CHEST - 2 VIEW

[chest pa]
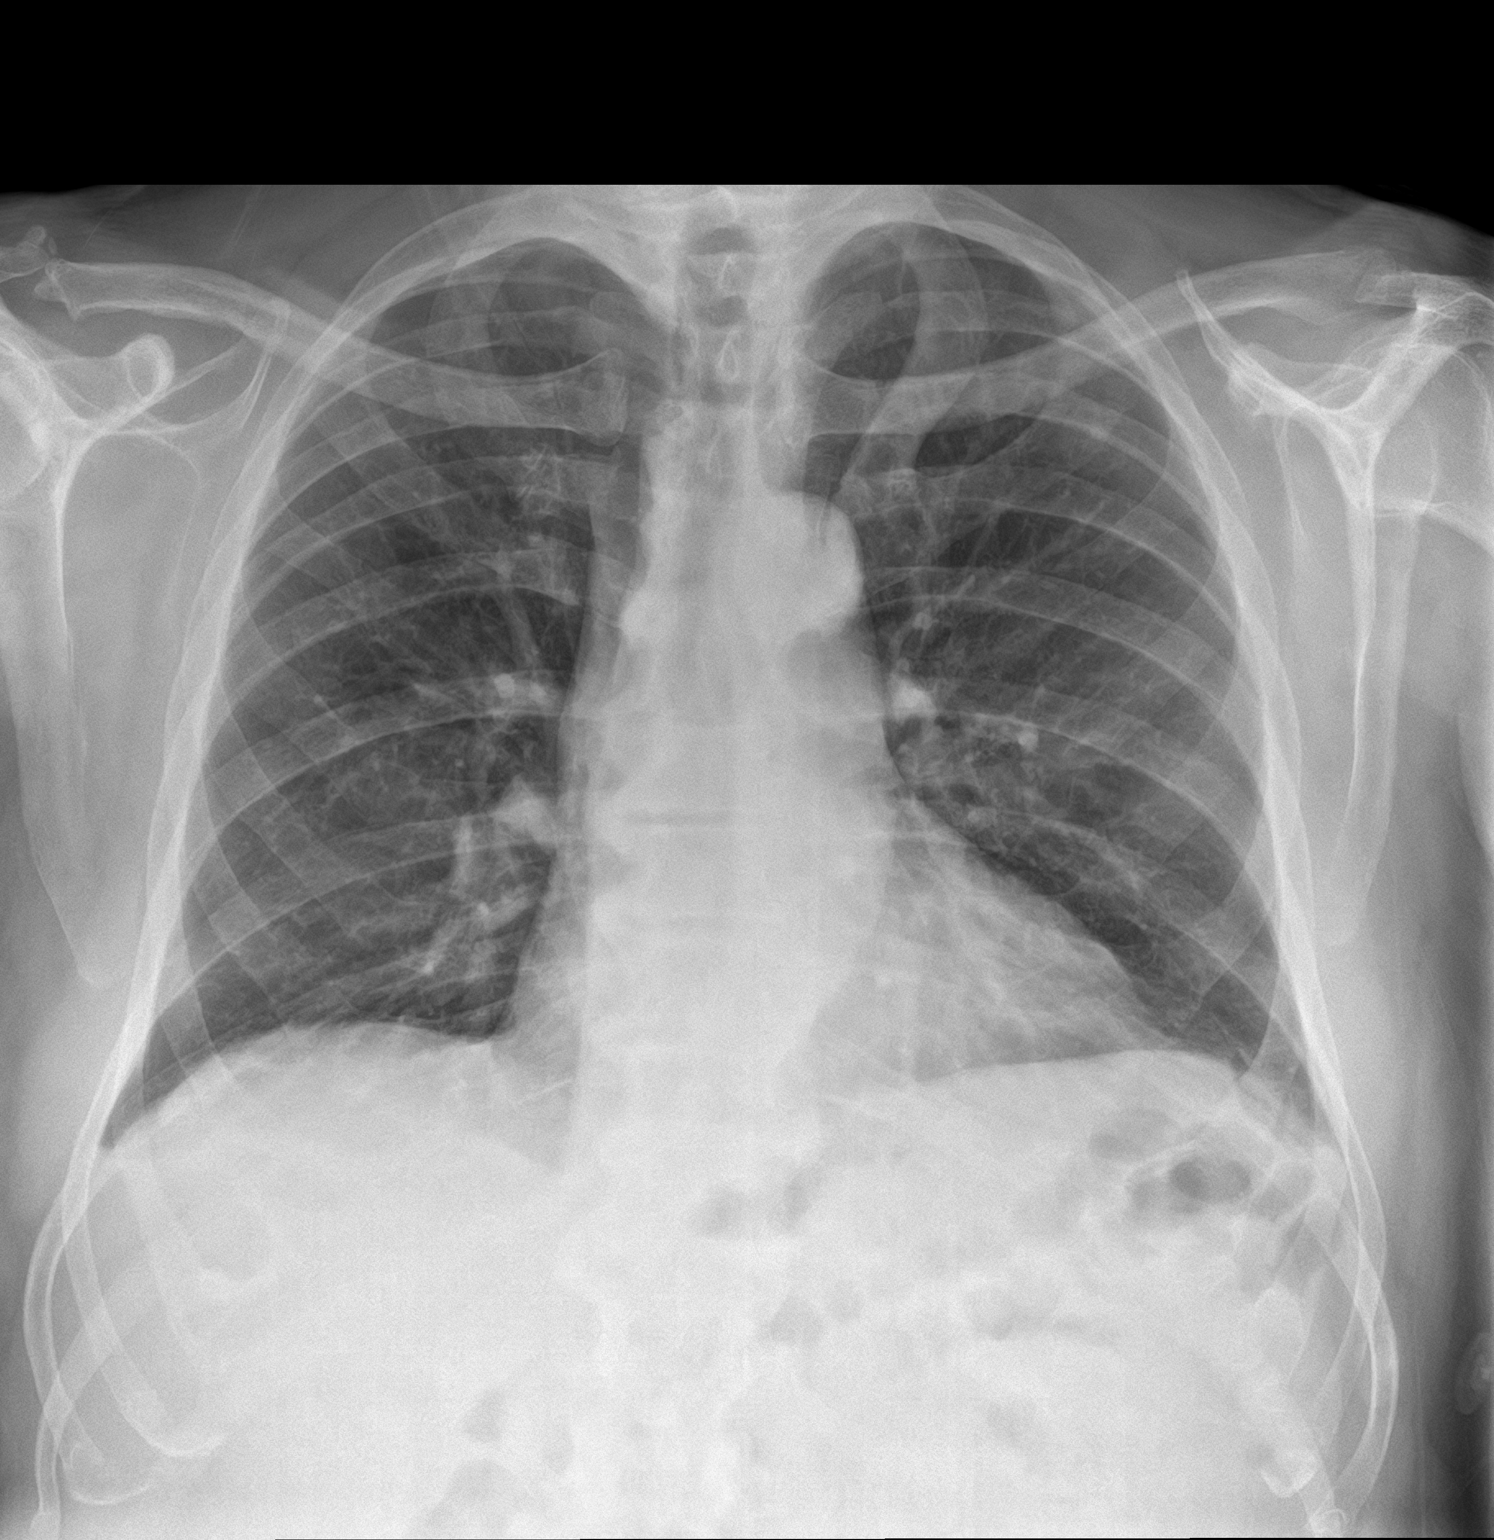

[chest lat]
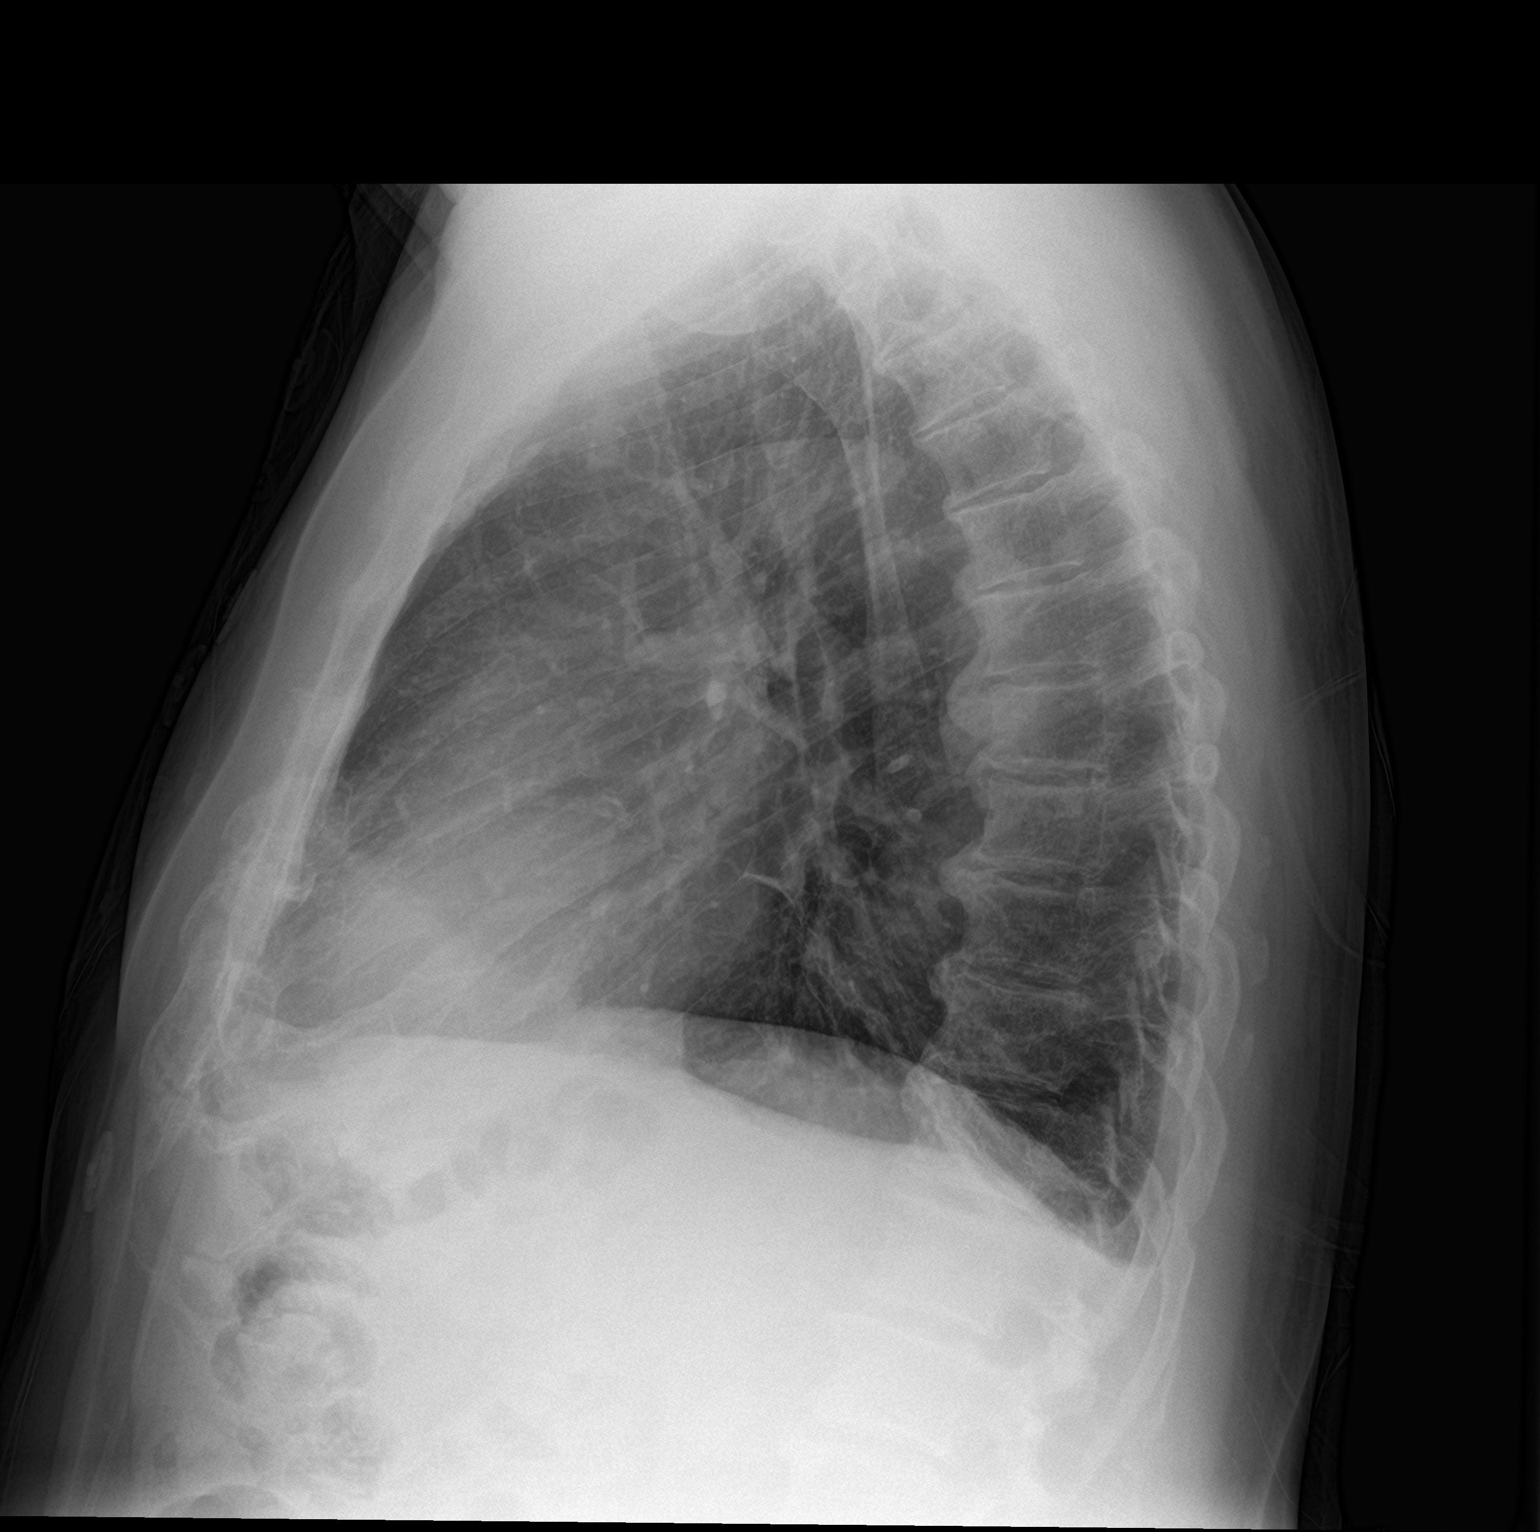

[2 of 2 positions shown; findings below may reference images not displayed]

FINDINGS: The heart size and mediastinal contours are within normal limits.

Left base compressive atelectasis. No focal consolidation. No
pulmonary edema. Blunting of bilateral costophrenic angles with
possible trace pleural effusions bilaterally. No pneumothorax.

No acute osseous abnormality. Multilevel degenerative changes of the
spine.
IMPRESSION: 1. Blunting of bilateral costophrenic angles with possible trace
pleural effusions bilaterally.
2. Otherwise no acute cardiopulmonary abnormality.

## 2021-12-07 IMAGING — DX DG CHEST 1V PORT
1 series · 2 of 2 positions shown · non-contrast
Comparison: 05/25/2020.

CLINICAL DATA: CABG.  Intubation.

EXAM:
PORTABLE CHEST 1 VIEW

[Series 1: chest · 0.14mm/px · 2 of 2 slices shown]
[im 1/2]
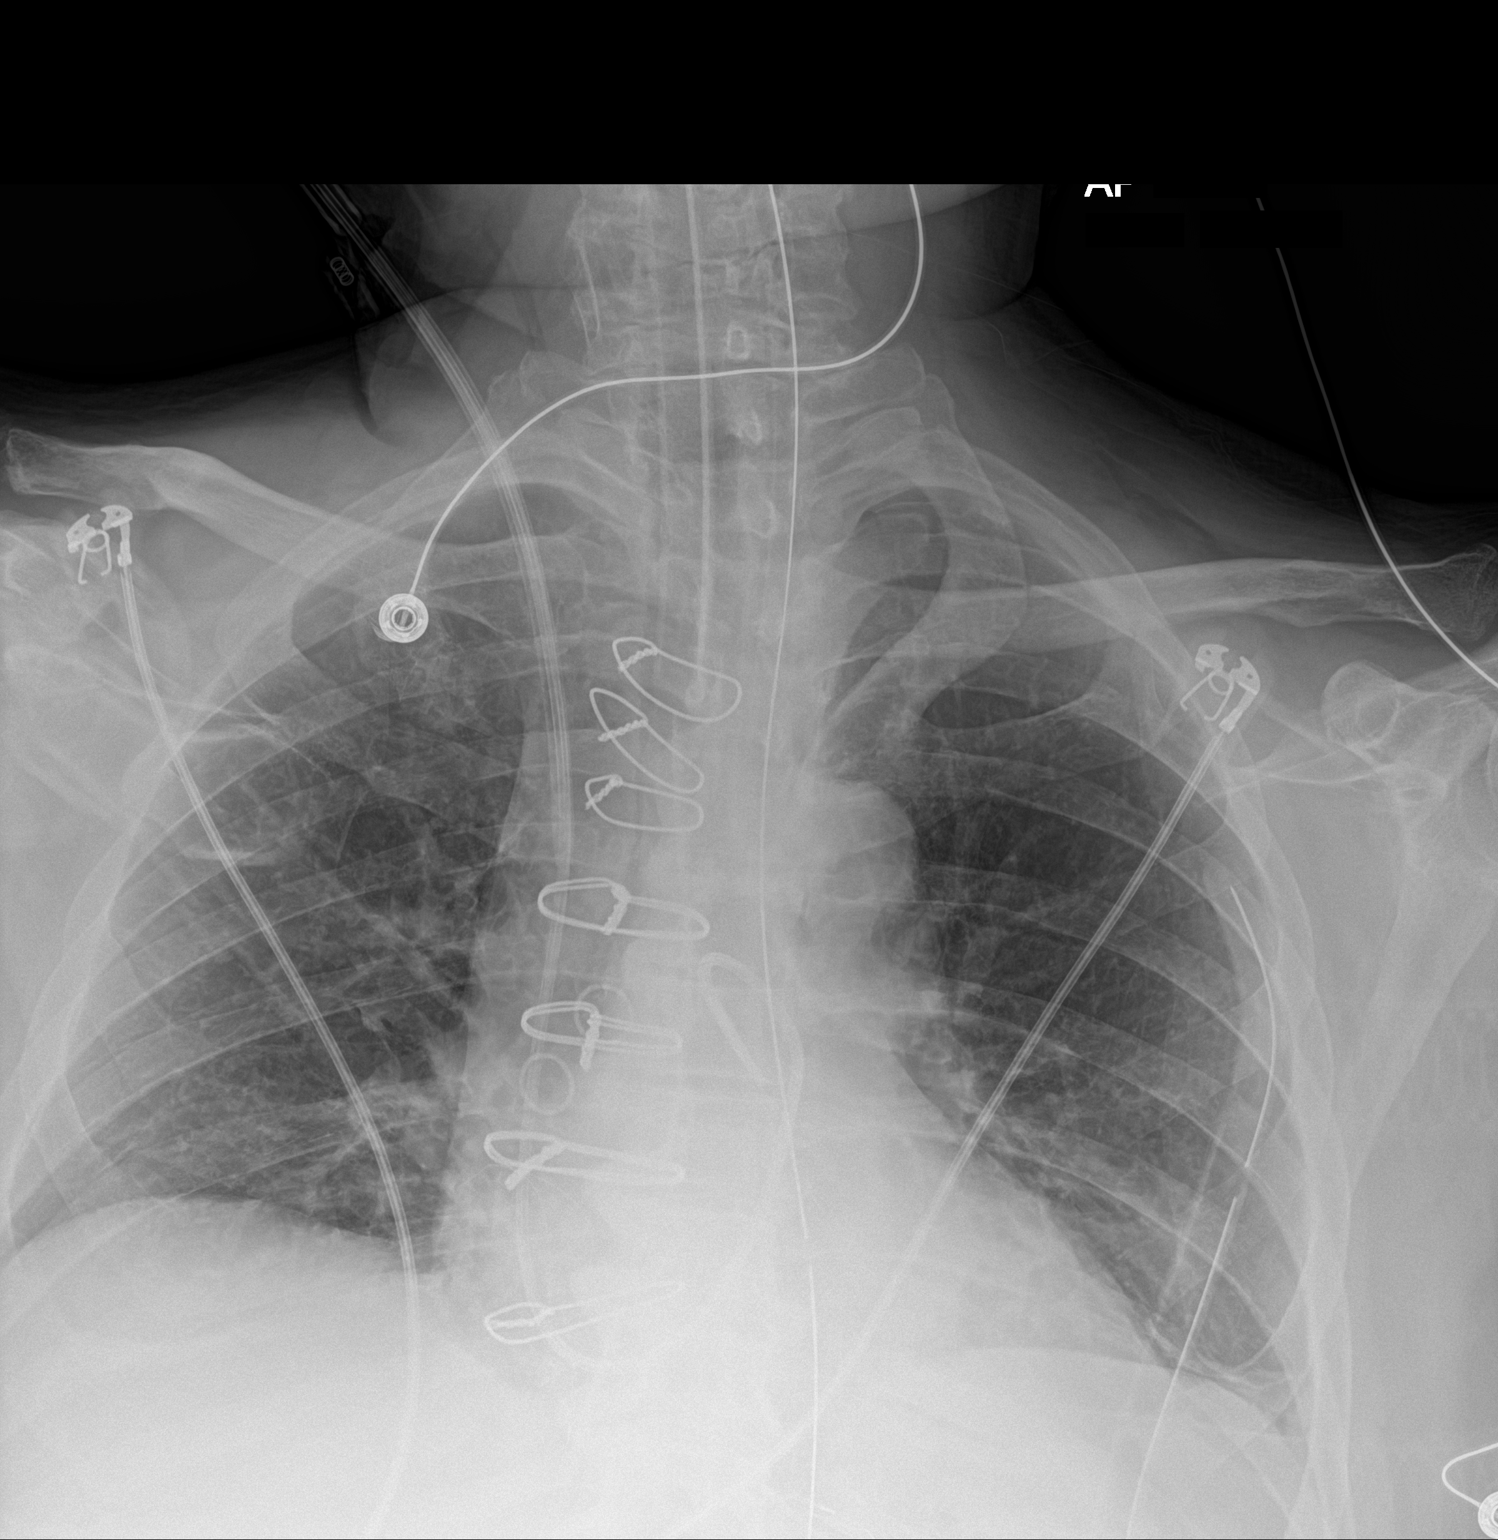
[im 2/2]
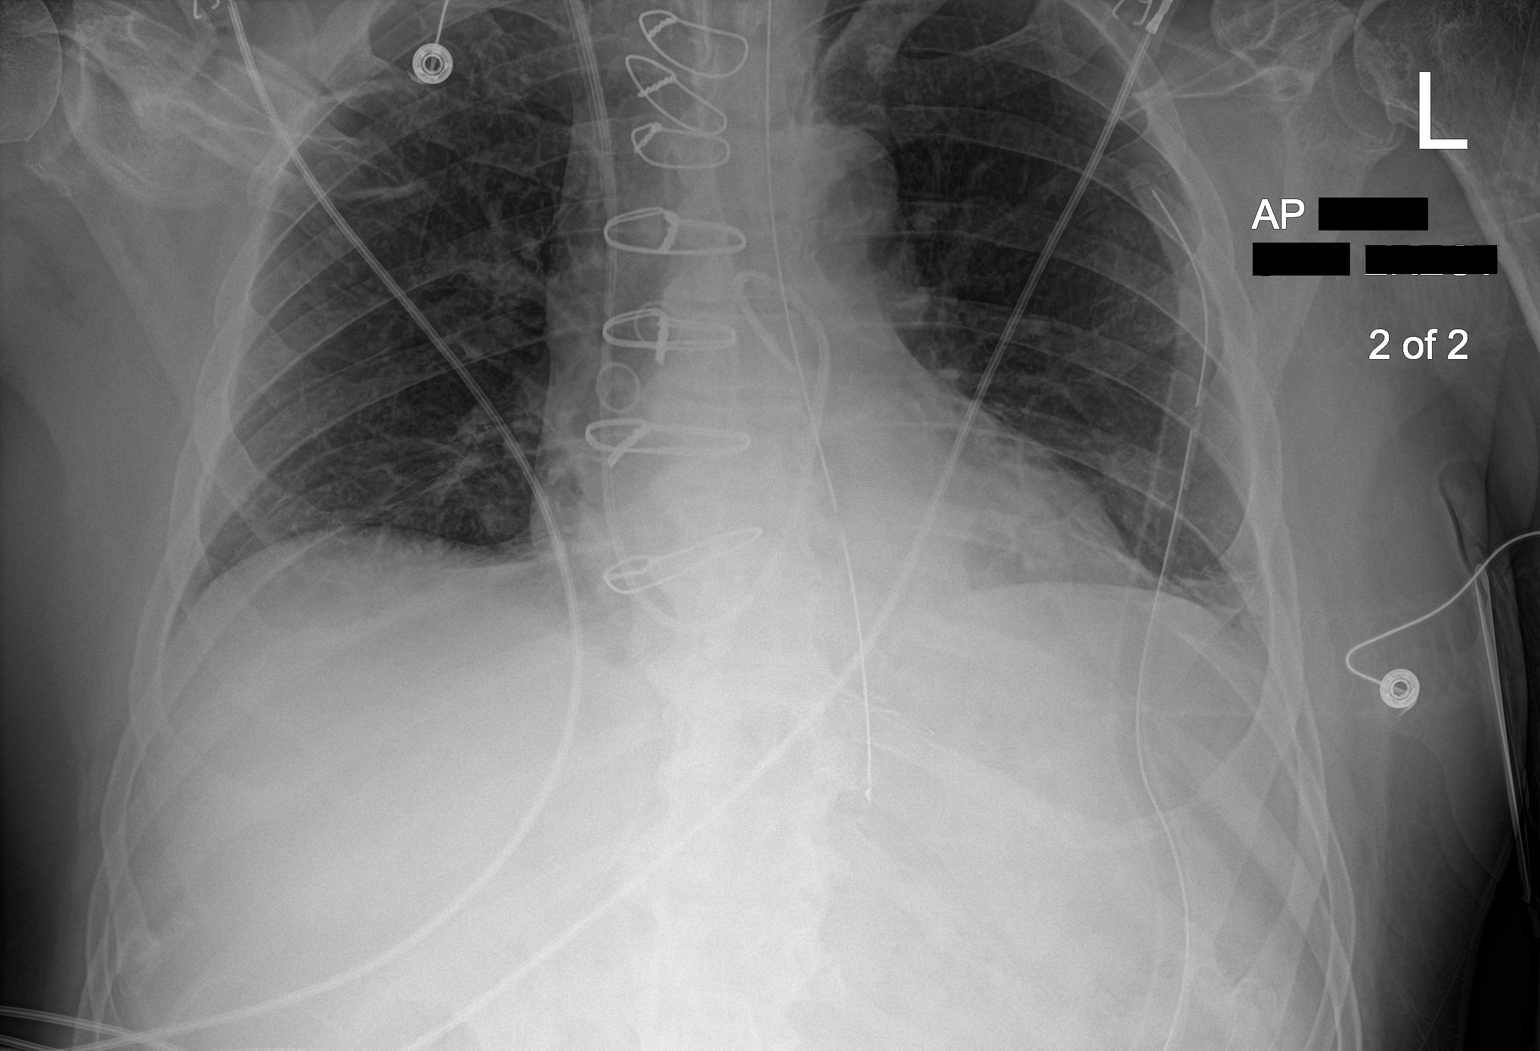

[2 of 2 positions shown; findings below may reference images not displayed]

FINDINGS: Endotracheal tube noted with its tip 3 cm above the carina. NG tube
tip noted just below left hemidiaphragm. Side hole is just above the
gastroesophageal junction. Advancement of the NG tube approximately
10 cm should be considered. Swan-Ganz catheter noted with tip coiled
in the pulmonary outflow tract. Mediastinal drainage tube and left
chest tube in good anatomic position. Very tiny left apical
pneumothorax cannot be excluded. Low lung volumes with bibasilar
atelectasis. No pleural effusion.
IMPRESSION: 1. Endotracheal tube tip 3 cm above the carina. Swan-Ganz catheter
noted with tip coiled in the pulmonary outflow tract. Mediastinal
drainage tube and left chest tube in good anatomic position. Very
tiny left apical pneumothorax cannot be excluded.
2. NG tube tip noted just below left hemidiaphragm. Side hole is
just above the gastroesophageal junction. Advancement of the NG tube
approximately 10 cm should be considered.
3. Prior CABG.  Heart size stable.
4. Low lung volumes with bibasilar atelectasis.

These results will be called to the ordering clinician or
representative by the Radiologist Assistant, and communication
documented in the PACS or [REDACTED].

## 2021-12-15 ENCOUNTER — Other Ambulatory Visit (INDEPENDENT_AMBULATORY_CARE_PROVIDER_SITE_OTHER): Payer: Self-pay | Admitting: Nurse Practitioner

## 2021-12-15 DIAGNOSIS — Z9889 Other specified postprocedural states: Secondary | ICD-10-CM

## 2021-12-16 ENCOUNTER — Ambulatory Visit (INDEPENDENT_AMBULATORY_CARE_PROVIDER_SITE_OTHER): Payer: 59

## 2021-12-16 ENCOUNTER — Encounter (INDEPENDENT_AMBULATORY_CARE_PROVIDER_SITE_OTHER): Payer: Self-pay | Admitting: Nurse Practitioner

## 2021-12-16 ENCOUNTER — Ambulatory Visit (INDEPENDENT_AMBULATORY_CARE_PROVIDER_SITE_OTHER): Payer: 59 | Admitting: Nurse Practitioner

## 2021-12-16 VITALS — BP 183/96 | HR 62 | Resp 17 | Ht 66.0 in | Wt 193.0 lb

## 2021-12-16 DIAGNOSIS — E782 Mixed hyperlipidemia: Secondary | ICD-10-CM | POA: Diagnosis not present

## 2021-12-16 DIAGNOSIS — I10 Essential (primary) hypertension: Secondary | ICD-10-CM | POA: Diagnosis not present

## 2021-12-16 DIAGNOSIS — Z9889 Other specified postprocedural states: Secondary | ICD-10-CM

## 2021-12-16 DIAGNOSIS — I739 Peripheral vascular disease, unspecified: Secondary | ICD-10-CM

## 2021-12-26 ENCOUNTER — Encounter (INDEPENDENT_AMBULATORY_CARE_PROVIDER_SITE_OTHER): Payer: Self-pay | Admitting: Nurse Practitioner

## 2021-12-26 NOTE — Progress Notes (Signed)
Subjective:    Patient ID: Clayton Powell, Dr., male    DOB: 01/12/1946, 76 y.o.   MRN: [id number] No chief complaint on file.   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 No interval shortening of the patient's claudication distance or development of rest pain symptoms. No new ulcers or wounds have occurred since the last visit.  There have been no significant changes to the patient's overall health care.  The patient denies amaurosis fugax or recent TIA symptoms. There are no documented recent neurological changes noted. There is no history of DVT, PE or superficial thrombophlebitis. The patient denies recent episodes of angina or shortness of breath.   The patient has a right ABI 1.26 with a left ABI of 1.26.  He has normal TBI's bilaterally.  Previously the right ABI was 1.22 and the left is 1.29.  Today the left lower extremity has biphasic tibial artery waveforms with normal toe waveforms.  The right lower extremity has primarily triphasic waveforms throughout the lower extremities with biphasic waveforms noted at the distal anterior tibial artery.   Review of Systems  All other systems reviewed and are negative.     Objective:   Physical Exam Vitals reviewed.  HENT:     Head: Normocephalic.  Cardiovascular:     Rate and Rhythm: Normal rate.     Pulses:          Dorsalis pedis pulses are detected w/ Doppler on the right side and detected w/ Doppler on the left side.       Posterior tibial pulses are detected w/ Doppler on the right side and detected w/ Doppler on the left side.  Pulmonary:     Effort: Pulmonary effort is normal.  Skin:    General: Skin is warm and dry.  Neurological:     Mental Status: He is alert and oriented to person, place, and time.  Psychiatric:        Mood and Affect: Mood normal.        Behavior:  Behavior normal.        Thought Content: Thought content normal.        Judgment: Judgment normal.    BP (!) 183/96 (BP Location: Left Arm)   Pulse 62   Resp 17   Ht '5\' 6"'$  (1.676 m)   Wt 193 lb (87.5 kg)   BMI 31.15 kg/m   Past Medical History:  Diagnosis Date   Allergy    Anginal pain (HCC)    BPH (benign prostatic hypertrophy) 6/07   CAD (coronary artery disease) 2010   dx made by coronary calcium scoring, Normal stress test in 2010.    GERD (gastroesophageal reflux disease) 12/05   HLD (hyperlipidemia) 3/99   HTN (hypertension) 2002   no meds   Left rotator cuff tear 08/12/2011   Per Dr. Mardelle Matte 2013 L rotator cuff tear on MRI 2013    Right rotator cuff tear 05/24/2013   Wears glasses     Social History   Socioeconomic History   Marital status: Married    Spouse name: Marlowe Kays   Number of children: 2   Years of education: Not on file   Highest education level: Not on file  Occupational History   Not on file  Tobacco Use   Smoking status: Never   Smokeless tobacco: Never  Vaping Use  Vaping Use: Never used  Substance and Sexual Activity   Alcohol use: Yes    Alcohol/week: 0.0 standard drinks    Comment: beer, occ   Drug use: No   Sexual activity: Yes  Other Topics Concern   Not on file  Social History Narrative   Married, 2 children - one is local   Married 40+ years   [place]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    Plays guitar   Works out regularly.   Social Determinants of Health   Financial Resource Strain: Not on file  Food Insecurity: Not on file  Transportation Needs: Not on file  Physical Activity: Not on file  Stress: Not on file  Social Connections: Not on file  Intimate Partner Violence: Not on file    Past Surgical History:  Procedure Laterality Date   admitted to [facility] chest pain  12/15/05   Dr. Clayborn Bigness   BLEPHAROPLASTY  10/11   Dr. Loletta Specter    bone spur removed Right    COLONOSCOPY  09/24/04   nml; Dr. Epifanio Lesches    CORONARY ARTERY  BYPASS GRAFT N/A 05/29/2020   Procedure: CORONARY ARTERY BYPASS GRAFTING (CABG) TIMES THREE USING LIMA to LAD; ENDOSCOPIC HARVESTED RIGHT GREATER SAPHENOUS VEIN: SVG to PD; SVG to RAMUS.;  Surgeon: Grace Isaac, MD;  Location: [place];  Service: Open Heart Surgery;  Laterality: N/A;   EGD esophagitis by bx  01/26/06   gastritis bx neg h-pylori   ENDOVEIN HARVEST OF GREATER SAPHENOUS VEIN Right 05/29/2020   Procedure: ENDOVEIN HARVEST OF GREATER SAPHENOUS VEIN;  Surgeon: Grace Isaac, MD;  Location: [place];  Service: Open Heart Surgery;  Laterality: Right;   LEFT HEART CATH AND CORONARY ANGIOGRAPHY N/A 05/26/2020   Procedure: LEFT HEART CATH AND CORONARY ANGIOGRAPHY;  Surgeon: Corey Skains, MD;  Location: [place] [facility];  Service: Cardiovascular;  Laterality: N/A;   LOWER EXTREMITY ANGIOGRAPHY Right 10/01/2020   Procedure: LOWER EXTREMITY ANGIOGRAPHY;  Surgeon: Algernon Huxley, MD;  Location: [place] [facility];  Service: Cardiovascular;  Laterality: Right;   LOWER EXTREMITY ANGIOGRAPHY Right 08/12/2021   Procedure: LOWER EXTREMITY ANGIOGRAPHY;  Surgeon: Algernon Huxley, MD;  Location: [place] [facility];  Service: Cardiovascular;  Laterality: Right;   LOWER EXTREMITY ANGIOGRAPHY Right 08/23/2021   Procedure: LOWER EXTREMITY ANGIOGRAPHY;  Surgeon: Algernon Huxley, MD;  Location: [place] [facility];  Service: Cardiovascular;  Laterality: Right;   POLYPECTOMY     right and left prostate needle bx  05/02/03   acute inflammation; Dr. Gaynelle Arabian    SHOULDER ARTHROSCOPY W/ ROTATOR CUFF REPAIR  12/2011   left   SHOULDER ARTHROSCOPY WITH ROTATOR CUFF REPAIR AND SUBACROMIAL DECOMPRESSION Right 05/24/2013   Procedure: RIGHT SHOULDER ARTHROSCOPY WITH ROTATOR CUFF REPAIR AND SUBACROMIAL DECOMPRESSION AND PARTIAL ACROMIOPLSTY WITH CORACOACROMIAL RELEASE, DISTAL CLAVICULECTOMY, LABRIAL DEBRIDEMENT;  Surgeon: Johnny Bridge, MD;  Location: [place];  Service: Orthopedics;  Laterality:  Right;   spect ETT nml  12/16/05   TEE WITHOUT CARDIOVERSION N/A 05/29/2020   Procedure: TRANSESOPHAGEAL ECHOCARDIOGRAM (TEE);  Surgeon: Grace Isaac, MD;  Location: [place];  Service: Open Heart Surgery;  Laterality: N/A;   UPPER GASTROINTESTINAL ENDOSCOPY     WISDOM TOOTH EXTRACTION      Family History  Problem Relation Age of Onset   Stroke Mother    Glaucoma Other        grandmother at 11   Barrett's esophagus Maternal Uncle    Prostate  cancer Neg Hx    Colon cancer Neg Hx    Colon polyps Neg Hx    Esophageal cancer Neg Hx    Rectal cancer Neg Hx    Stomach cancer Neg Hx     Allergies  Allergen Reactions   Ciprofloxacin Hcl Rash   Quinolones Rash       Latest Ref Rng & Units 06/01/2020    2:21 AM 05/31/2020    4:01 AM 05/30/2020    5:10 PM  CBC  WBC 4.0 - 10.5 K/uL 9.5   9.5   10.2    Hemoglobin 13.0 - 17.0 g/dL 10.8   10.1   11.5    Hematocrit 39.0 - 52.0 % 32.9   31.4   35.3    Platelets 150 - 400 K/uL 141   117   132        CMP     Component Value Date/Time   NA 140 06/02/2020 0824   NA 140 05/12/2012 1240   K 3.9 06/02/2020 0824   K 4.2 05/12/2012 1240   CL 106 06/02/2020 0824   CL 107 05/12/2012 1240   CO2 25 06/02/2020 0824   CO2 26 05/12/2012 1240   GLUCOSE 102 (H) 06/02/2020 0824   GLUCOSE 101 (H) 05/12/2012 1240   BUN 25 (H) 08/23/2021 0736   BUN 21 (H) 05/12/2012 1240   CREATININE 0.97 08/23/2021 0736   CREATININE 0.86 05/12/2012 1240   CALCIUM 8.3 (L) 06/02/2020 0824   CALCIUM 8.9 05/12/2012 1240   PROT 7.1 05/25/2020 1715   PROT 7.4 01/17/2012 0807   ALBUMIN 4.4 05/25/2020 1715   ALBUMIN 3.9 01/17/2012 0807   AST 34 05/25/2020 1715   AST 57 (H) 01/17/2012 0807   ALT 29 05/25/2020 1715   ALT 49 01/17/2012 0807   ALKPHOS 73 05/25/2020 1715   ALKPHOS 74 01/17/2012 0807   BILITOT 0.8 05/25/2020 1715   BILITOT 0.8 01/17/2012 0807   GFRNONAA >60 08/23/2021 0736   GFRNONAA >60 05/12/2012 1240   GFRAA >60 05/12/2012 1240     VAS [place]  ABI WITH/WO TBI  Result Date: 12/22/2021  LOWER EXTREMITY DOPPLER STUDY Patient Name:  Clayton Powell  Date of Exam:   12/16/2021 Medical Rec #: [id number]         Accession #:    [id number] Date of Birth: May 06, 1946        Patient Gender: M Patient Age:   33 years Exam [facility] Procedure:      VAS [place] ABI WITH/WO TBI Referring Phys: Eulogio Ditch --------------------------------------------------------------------------------  Indications: Peripheral artery disease.  Vascular Interventions: 08/23/21: Right SFA/popliteal stent x2;. Performing Technologist: Blondell Reveal RT, RDMS, RVT  Examination Guidelines: A complete evaluation includes at minimum, Doppler waveform signals and systolic blood pressure reading at the level of bilateral brachial, anterior tibial, and posterior tibial arteries, when vessel segments are accessible. Bilateral testing is considered an integral part of a complete examination. Photoelectric Plethysmograph (PPG) waveforms and toe systolic pressure readings are included as required and additional duplex testing as needed. Limited examinations for reoccurring indications may be performed as noted.  ABI Findings: +---------+------------------+-----+---------+--------+ Right    Rt Pressure (mmHg)IndexWaveform Comment  +---------+------------------+-----+---------+--------+ Brachial 164                                      +---------+------------------+-----+---------+--------+ ATA      189  1.15 biphasic          +---------+------------------+-----+---------+--------+ PTA      207               1.26 triphasic         +---------+------------------+-----+---------+--------+ Great Toe135               0.82 Normal            +---------+------------------+-----+---------+--------+ +---------+------------------+-----+--------+-------+ Left     Lt Pressure (mmHg)IndexWaveformComment  +---------+------------------+-----+--------+-------+ Brachial 157                                    +---------+------------------+-----+--------+-------+ ATA      170               1.04 biphasic        +---------+------------------+-----+--------+-------+ PTA      206               1.26 biphasic        +---------+------------------+-----+--------+-------+ Great Toe142               0.87 Normal          +---------+------------------+-----+--------+-------+ +-------+-----------+-----------+------------+------------+ ABI/TBIToday's ABIToday's TBIPrevious ABIPrevious TBI +-------+-----------+-----------+------------+------------+ Right  1.26       0.82       1.22        0.80         +-------+-----------+-----------+------------+------------+ Left   1.26       0.87       1.29        0.87         +-------+-----------+-----------+------------+------------+ Bilateral ABIs appear essentially unchanged compared to prior study on 09/16/21.  Summary: Bilateral: Bilateral ankle-brachial indexes are within normal range. No evidence of significant lower extremity arterial disease. Bilateral toe-brachial indexes are within normal range.  *See table(s) above for measurements and observations.  Electronically signed by Leotis Pain MD on 12/22/2021 at 9:17:02 AM.    Final        Assessment & Plan:   1. Peripheral arterial disease with history of revascularization (HCC)  Recommend:  The patient has evidence of atherosclerosis of the lower extremities with claudication.  The patient does not voice lifestyle limiting changes at this point in time.  Noninvasive studies do not suggest clinically significant change.  No invasive studies, angiography or surgery at this time The patient should continue walking and begin a more formal exercise program.  The patient should continue antiplatelet therapy and aggressive treatment of the lipid abnormalities  No changes in the patient's  medications at this time  Continued surveillance is indicated as atherosclerosis is likely to progress with time.    The patient will continue follow up with noninvasive studies as ordered.    2. Primary hypertension Continue antihypertensive medications as already ordered, these medications have been reviewed and there are no changes at this time.  Patient's blood pressure is elevated today however he does have some noted whitecoat syndrome.  He is advised to evaluate his blood pressure at home and if it continues to remain elevated to discuss with PCP and her cardiologist  3. Hyperlipidemia, mixed Continue statin as ordered and reviewed, no changes at this time    Current Outpatient Medications on File Prior to Visit  Medication Sig Dispense Refill   aspirin EC 81 MG tablet Take 81 mg by mouth daily. Swallow whole.     Azelastine-Fluticasone 137-50 MCG/ACT  SUSP Place 1 spray into both nostrils 2 (two) times daily.     Cholecalciferol (VITAMIN D) 2000 UNITS CAPS Take 1 capsule by mouth daily.     clopidogrel (PLAVIX) 75 MG tablet Take 1 tablet (75 mg total) by mouth daily. 30 tablet 11   Cyanocobalamin (VITAMIN B-12) 5000 MCG SUBL Place 1 tablet under the tongue daily.     icosapent Ethyl (VASCEPA) 1 g capsule Take 1 tablet by mouth 4 (four) times daily.     meloxicam (MOBIC) 15 MG tablet Take 15 mg by mouth as needed.     rosuvastatin (CRESTOR) 40 MG tablet Take 40 mg by mouth daily.     tadalafil (CIALIS) 5 MG tablet Take by mouth.     Turmeric (QC TUMERIC COMPLEX PO) Take by mouth.     No current facility-administered medications on file prior to visit.    There are no Patient Instructions on file for this visit. No follow-ups on file.   Kris Hartmann, NP

## 2022-01-03 IMAGING — CR DG CHEST 2V
2 series · 2 of 2 positions shown · non-contrast
Comparison: Chest radiograph June 01, 2020

CLINICAL DATA: Status post CABG

EXAM:
CHEST - 2 VIEW

[w chest pa]
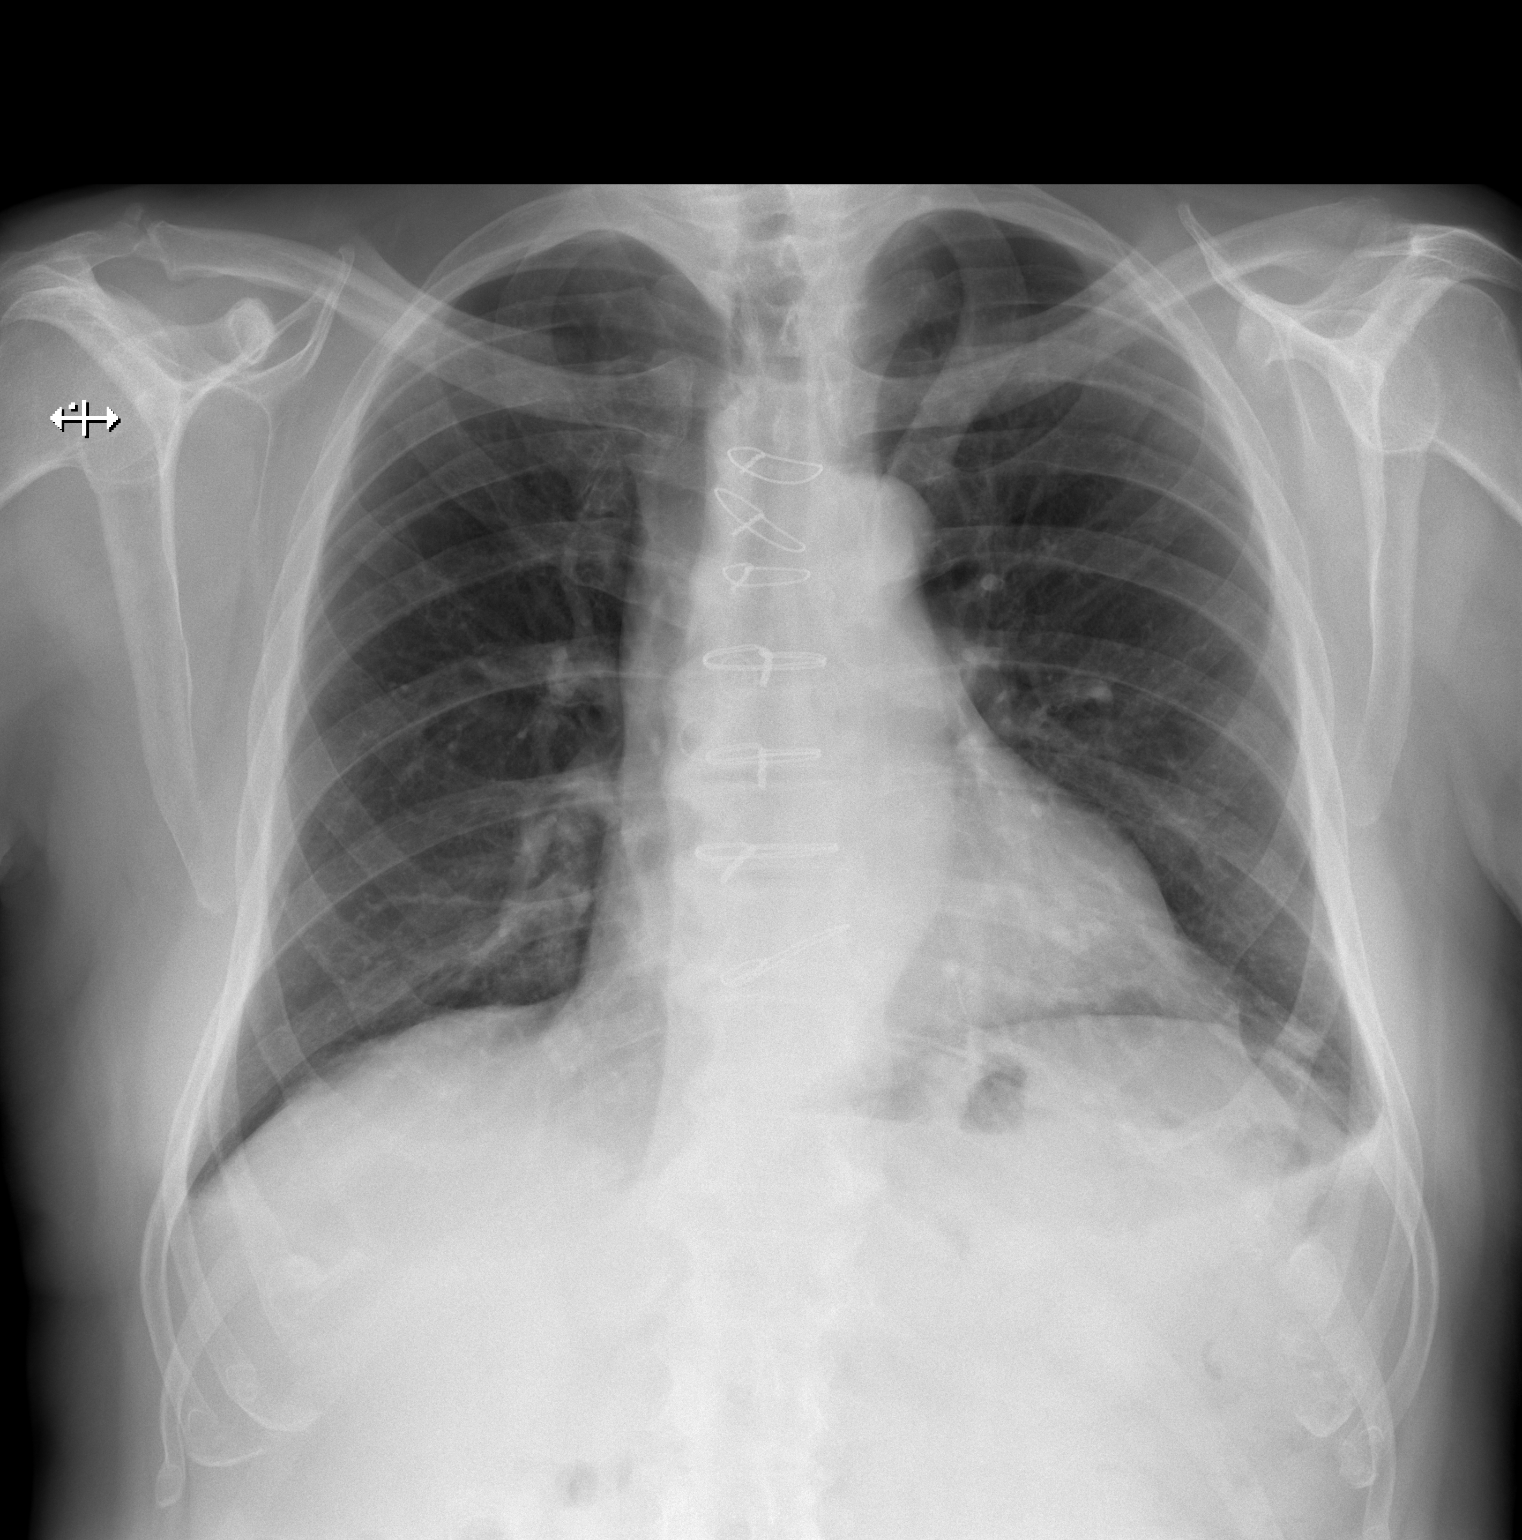

[w chest lat]
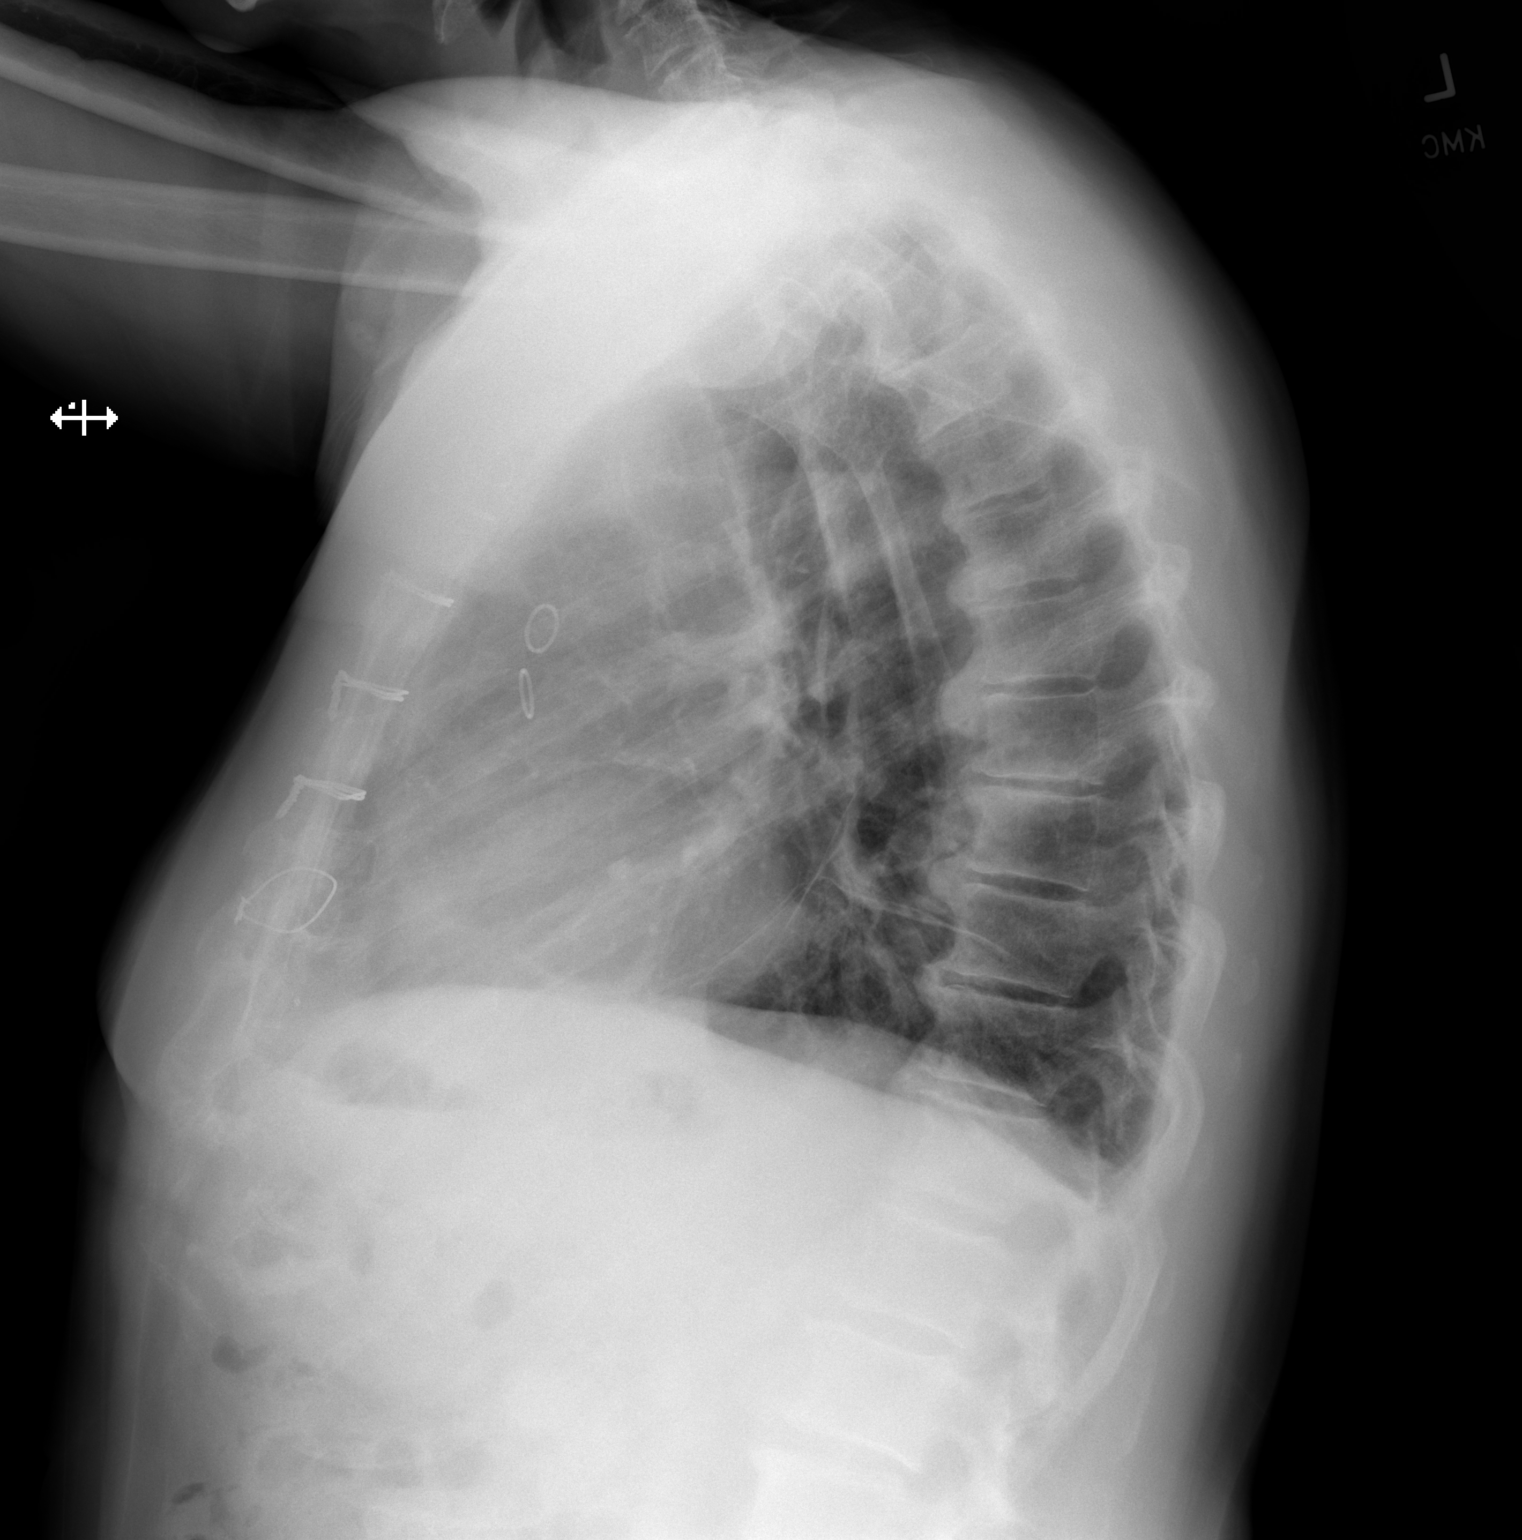

[2 of 2 positions shown; findings below may reference images not displayed]

FINDINGS: Changes of prior median sternotomy and CABG. Heart size and
mediastinal contours are within normal limits. Small left p pleural
effusion with adjacent compressive atelectasis. Thoracic
spondylosis.
IMPRESSION: Small left pleural effusion with adjacent compressive atelectasis.

## 2022-05-23 ENCOUNTER — Encounter (INDEPENDENT_AMBULATORY_CARE_PROVIDER_SITE_OTHER): Payer: Self-pay

## 2022-06-13 ENCOUNTER — Other Ambulatory Visit (INDEPENDENT_AMBULATORY_CARE_PROVIDER_SITE_OTHER): Payer: Self-pay | Admitting: Nurse Practitioner

## 2022-06-13 DIAGNOSIS — Z9889 Other specified postprocedural states: Secondary | ICD-10-CM

## 2022-06-14 ENCOUNTER — Ambulatory Visit (INDEPENDENT_AMBULATORY_CARE_PROVIDER_SITE_OTHER): Payer: Medicare Other

## 2022-06-14 ENCOUNTER — Encounter (INDEPENDENT_AMBULATORY_CARE_PROVIDER_SITE_OTHER): Payer: Self-pay | Admitting: Vascular Surgery

## 2022-06-14 ENCOUNTER — Ambulatory Visit (INDEPENDENT_AMBULATORY_CARE_PROVIDER_SITE_OTHER): Payer: Medicare Other | Admitting: Vascular Surgery

## 2022-06-14 VITALS — BP 168/91 | HR 62 | Resp 17

## 2022-06-14 DIAGNOSIS — M25562 Pain in left knee: Secondary | ICD-10-CM | POA: Diagnosis not present

## 2022-06-14 DIAGNOSIS — Z9889 Other specified postprocedural states: Secondary | ICD-10-CM

## 2022-06-14 DIAGNOSIS — E782 Mixed hyperlipidemia: Secondary | ICD-10-CM

## 2022-06-14 DIAGNOSIS — I739 Peripheral vascular disease, unspecified: Secondary | ICD-10-CM | POA: Diagnosis not present

## 2022-06-14 DIAGNOSIS — I70211 Atherosclerosis of native arteries of extremities with intermittent claudication, right leg: Secondary | ICD-10-CM

## 2022-06-14 DIAGNOSIS — G8929 Other chronic pain: Secondary | ICD-10-CM

## 2022-06-14 NOTE — Assessment & Plan Note (Signed)
Has been getting gel injections and may require knee replacement at some point going forward.  Has adequate flow for healing if he does require surgery.

## 2022-06-14 NOTE — Progress Notes (Signed)
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 Marland Kitchen  History of Present Illness: Patient returns today in follow up of his peripheral arterial disease.  He is doing well today.  He had right SFA and popliteal intervention back in January of this year from a pedal approach with 2 new stent placements.  He has done well since that time.  He is on Plavix.  He is tolerating this well.  He is not having any current lifestyle limiting claudication, ischemic rest pain, or ulceration.  His noninvasive studies today show a right ABI of 1.21 and a left ABI of 1.28 with triphasic waveforms and normal digital pressures bilaterally.  Current Outpatient Medications  Medication Sig Dispense Refill   Azelastine-Fluticasone 137-50 MCG/ACT SUSP Place 1 spray into both nostrils 2 (two) times daily.     Cholecalciferol (VITAMIN D) 2000 UNITS CAPS Take 1 capsule by mouth daily.     clopidogrel (PLAVIX) 75 MG tablet Take 1 tablet (75 mg total) by mouth daily. 30 tablet 11   Cyanocobalamin (VITAMIN B-12) 5000 MCG SUBL Place 1 tablet under the tongue daily.     icosapent Ethyl (VASCEPA) 1 g capsule Take 1 tablet by mouth 4 (four) times daily.     meloxicam (MOBIC) 15 MG tablet Take 15 mg by mouth as needed.     rosuvastatin (CRESTOR) 40 MG tablet Take 40 mg by mouth daily.     tadalafil (CIALIS) 5 MG tablet Take by mouth.     No current facility-administered medications for this visit.    Past Medical History:  Diagnosis Date   Allergy    Anginal pain ([place])    BPH (benign prostatic hypertrophy) 6/07   CAD (coronary artery disease) 2010   dx made by coronary calcium scoring, Normal stress test in 2010.    GERD (gastroesophageal reflux disease) 12/05   HLD (hyperlipidemia) 3/99   HTN (hypertension) 2002   no meds   Left rotator cuff tear 08/12/2011   Per Dr. Mardelle Matte 2013 L rotator cuff tear on MRI 2013    Right rotator  cuff tear 05/24/2013   Wears glasses     Past Surgical History:  Procedure Laterality Date   admitted to [facility] chest pain  12/15/05   Dr. Clayborn Bigness   BLEPHAROPLASTY  10/11   Dr. Loletta Specter    bone spur removed Right    COLONOSCOPY  09/24/04   nml; Dr. Epifanio Lesches    CORONARY ARTERY BYPASS GRAFT N/A 05/29/2020   Procedure: CORONARY ARTERY BYPASS GRAFTING (CABG) TIMES THREE USING LIMA to LAD; ENDOSCOPIC HARVESTED RIGHT GREATER SAPHENOUS VEIN: SVG to PD; SVG to RAMUS.;  Surgeon: Grace Isaac, MD;  Location: [place];  Service: Open Heart Surgery;  Laterality: N/A;   EGD esophagitis by bx  01/26/06   gastritis bx neg h-pylori   ENDOVEIN HARVEST OF GREATER SAPHENOUS VEIN Right 05/29/2020   Procedure: ENDOVEIN HARVEST OF GREATER SAPHENOUS VEIN;  Surgeon: Grace Isaac, MD;  Location: [place];  Service: Open Heart Surgery;  Laterality: Right;   LEFT HEART CATH AND CORONARY ANGIOGRAPHY N/A 05/26/2020   Procedure: LEFT HEART CATH AND CORONARY ANGIOGRAPHY;  Surgeon: Corey Skains, MD;  Location: [place] [facility];  Service: Cardiovascular;  Laterality: N/A;   LOWER EXTREMITY ANGIOGRAPHY Right 10/01/2020   Procedure: LOWER EXTREMITY ANGIOGRAPHY;  Surgeon: Algernon Huxley, MD;  Location: [place] [facility];  Service: Cardiovascular;  Laterality: Right;   LOWER EXTREMITY ANGIOGRAPHY Right 08/12/2021   Procedure: LOWER EXTREMITY ANGIOGRAPHY;  Surgeon: Algernon Huxley, MD;  Location: [place] [facility];  Service: Cardiovascular;  Laterality: Right;   LOWER EXTREMITY ANGIOGRAPHY Right 08/23/2021   Procedure: LOWER EXTREMITY ANGIOGRAPHY;  Surgeon: Algernon Huxley, MD;  Location: [place] [facility];  Service: Cardiovascular;  Laterality: Right;   POLYPECTOMY     right and left prostate needle bx  05/02/03   acute inflammation; Dr. Gaynelle Arabian    SHOULDER ARTHROSCOPY W/ ROTATOR CUFF REPAIR  12/2011   left   SHOULDER ARTHROSCOPY WITH ROTATOR CUFF REPAIR AND SUBACROMIAL DECOMPRESSION Right 05/24/2013   Procedure: RIGHT  SHOULDER ARTHROSCOPY WITH ROTATOR CUFF REPAIR AND SUBACROMIAL DECOMPRESSION AND PARTIAL ACROMIOPLSTY WITH CORACOACROMIAL RELEASE, DISTAL CLAVICULECTOMY, LABRIAL DEBRIDEMENT;  Surgeon: Johnny Bridge, MD;  Location: [place];  Service: Orthopedics;  Laterality: Right;   spect ETT nml  12/16/05   TEE WITHOUT CARDIOVERSION N/A 05/29/2020   Procedure: TRANSESOPHAGEAL ECHOCARDIOGRAM (TEE);  Surgeon: Grace Isaac, MD;  Location: [place];  Service: Open Heart Surgery;  Laterality: N/A;   UPPER GASTROINTESTINAL ENDOSCOPY     WISDOM TOOTH EXTRACTION       Social History   Tobacco Use   Smoking status: Never   Smokeless tobacco: Never  Vaping Use   Vaping Use: Never used  Substance Use Topics   Alcohol use: Yes    Alcohol/week: 0.0 standard drinks of alcohol    Comment: beer, occ   Drug use: No       Family History  Problem Relation Age of Onset   Stroke Mother    Glaucoma Other        grandmother at 37   Barrett's esophagus Maternal Uncle    Prostate cancer Neg Hx    Colon cancer Neg Hx    Colon polyps Neg Hx    Esophageal cancer Neg Hx    Rectal cancer Neg Hx    Stomach cancer Neg Hx      Allergies  Allergen Reactions   Ciprofloxacin Hcl Rash   Quinolones Rash     REVIEW OF SYSTEMS (Negative unless checked)  Constitutional: '[]'$ Weight loss  '[]'$ Fever  '[]'$ Chills Cardiac: '[]'$ Chest pain   '[]'$ Chest pressure   '[]'$ Palpitations   '[]'$ Shortness of breath when laying flat   '[]'$ Shortness of breath at rest   '[]'$ Shortness of breath with exertion. Vascular:  '[]'$ Pain in legs with walking   '[]'$ Pain in legs at rest   '[]'$ Pain in legs when laying flat   '[]'$ Claudication   '[]'$ Pain in feet when walking  '[]'$ Pain in feet at rest  '[]'$ Pain in feet when laying flat   '[]'$ History of DVT   '[]'$ Phlebitis   '[]'$ Swelling in legs   '[]'$ Varicose veins   '[]'$ Non-healing ulcers Pulmonary:   '[]'$ Uses home oxygen   '[]'$ Productive cough   '[]'$ Hemoptysis   '[]'$ Wheeze  '[]'$ COPD   '[]'$ Asthma Neurologic:  '[]'$ Dizziness  '[]'$ Blackouts    '[]'$ Seizures   '[]'$ History of stroke   '[]'$ History of TIA  '[]'$ Aphasia   '[]'$ Temporary blindness   '[]'$ Dysphagia   '[]'$ Weakness or numbness in arms   '[]'$ Weakness or numbness in legs Musculoskeletal:  '[x]'$ Arthritis   '[]'$ Joint swelling   '[x]'$ Joint pain   '[]'$ Low back pain Hematologic:  '[]'$ Easy bruising  '[]'$ Easy bleeding   '[]'$ Hypercoagulable state   '[]'$ Anemic   Gastrointestinal:  '[]'$ Blood in stool   '[]'$ Vomiting blood  '[x]'$ Gastroesophageal reflux/heartburn   '[]'$ Abdominal pain Genitourinary:  '[]'$ Chronic kidney disease   '[]'$   Difficult urination  '[]'$ Frequent urination  '[]'$ Burning with urination   '[]'$ Hematuria Skin:  '[]'$ Rashes   '[]'$ Ulcers   '[]'$ Wounds Psychological:  '[]'$ History of anxiety   '[]'$  History of major depression.  Physical Examination  BP (!) 168/91 (BP Location: Right Arm)   Pulse 62   Resp 17  Gen:  WD/WN, NAD.  Appears younger than stated age Head: [place]/AT, No temporalis wasting. Ear/Nose/Throat: Hearing grossly intact, nares w/o erythema or drainage Eyes: Conjunctiva clear. Sclera non-icteric Neck: Supple.  Trachea midline Pulmonary:  Good air movement, no use of accessory muscles.  Cardiac: RRR, no JVD Vascular:  Vessel Right Left  Radial Palpable Palpable                          PT Palpable Palpable  DP Palpable Palpable   Gastrointestinal: soft, non-tender/non-distended. No guarding/reflex.  Musculoskeletal: M/S 5/5 throughout.  No deformity or atrophy. No edema. Neurologic: Sensation grossly intact in extremities.  Symmetrical.  Speech is fluent.  Psychiatric: Judgment intact, Mood & affect appropriate for pt's clinical situation. Dermatologic: No rashes or ulcers noted.  No cellulitis or open wounds.      Labs No results found for this or any previous visit (from the past 2160 hour(s)).  Radiology No results found.  Assessment/Plan  Atherosclerosis of native arteries of extremity with intermittent claudication (HCC) His noninvasive studies today show a right ABI of 1.21 and a left ABI of 1.28  with triphasic waveforms and normal digital pressures bilaterally.  Doing well.  Continue Plavix and Crestor.  Recheck in 6 months with noninvasive studies.  Hyperlipidemia, mixed lipid control important in reducing the progression of atherosclerotic disease. Continue statin therapy   Left knee pain Has been getting gel injections and may require knee replacement at some point going forward.  Has adequate flow for healing if he does require surgery.    Leotis Pain, MD  06/14/2022 10:25 AM    This note was created with Dragon medical transcription system.  Any errors from dictation are purely unintentional

## 2022-06-14 NOTE — Assessment & Plan Note (Signed)
His noninvasive studies today show a right ABI of 1.21 and a left ABI of 1.28 with triphasic waveforms and normal digital pressures bilaterally.  Doing well.  Continue Plavix and Crestor.  Recheck in 6 months with noninvasive studies.

## 2022-06-14 NOTE — Assessment & Plan Note (Signed)
lipid control important in reducing the progression of atherosclerotic disease. Continue statin therapy  

## 2022-07-12 ENCOUNTER — Telehealth: Payer: Self-pay

## 2022-07-12 ENCOUNTER — Ambulatory Visit
Admission: EM | Admit: 2022-07-12 | Discharge: 2022-07-12 | Disposition: A | Discharge status: Home / Self Care | Payer: Medicare Other | Attending: Urgent Care | Admitting: Urgent Care

## 2022-07-12 DIAGNOSIS — J22 Unspecified acute lower respiratory infection: Secondary | ICD-10-CM

## 2022-07-12 MED ORDER — AZITHROMYCIN 250 MG PO TABS: ORAL_TABLET | ORAL | 0 refills | Status: DC

## 2022-07-12 MED ORDER — AMOXICILLIN-POT CLAVULANATE 875-125 MG PO TABS: 1.0000 | ORAL_TABLET | Freq: Two times a day (BID) | ORAL | 0 refills | Status: DC

## 2022-07-12 NOTE — Discharge Instructions (Addendum)
This follow-up with your primary care provider regarding a possible need for evaluation by pulmonology.

## 2022-07-12 NOTE — Telephone Encounter (Signed)
Agree with recheck.  Thanks.

## 2022-07-12 NOTE — Telephone Encounter (Signed)
Carriei RN with access nurse said [place] and dx with [place]; pt was given 5 days of steroid and tamiflu. Pt having cough and some SOB. Access disposition is to be seen within the hr. Morey Hummingbird said pt does not appear in distress and since no available appts at [facility] or [facility] pt will be advised to go to UC. Will add access note when available. Sending note to Dr Damita Dunnings and Damita Dunnings pool.   [facility] - Client TELEPHONE ADVICE RECORD AccessNurse Patient Name: Clayton Powell Gender: Male DOB: [birth year] Age: 76 Y 39 D Return Phone Number: [phone] (Primary) Address: City/ State/ Zip: [place] [address] Client [facility] - Client Client Site [place] - Day Provider AA - PHYSICIAN, Verita Schneiders- MD Contact Type Call Who Is Calling Patient / Member / Family / Caregiver Call Type Triage / Clinical Relationship To Patient Self Return Phone Number [phone] (Primary) Chief Complaint WHEEZING Reason for Call Symptomatic / Request for Health Information Initial Comment Caller states he was positive for flu and was taking tama flu and still not feel well. Has runny nose and cough still. He is wheezing. Translation No Nurse Assessment Nurse: Rolin Barry, RN, Levada Dy Date/Time Eilene Ghazi Time): 07/12/2022 9:19:46 AM Confirm and document reason for call. If symptomatic, describe symptoms. ---Caller states he was positive for flu and was taking tama flu and still not feel well. Has runny nose and cough still. He is wheezing. Tested positive a week ago. Does the patient have any new or worsening symptoms? ---Yes Will a triage be completed? ---Yes Related visit to physician within the last 2 weeks? ---Yes Does the PT have any chronic conditions? (i.e. diabetes, asthma, this includes High risk factors for pregnancy, etc.) ---Yes List chronic conditions. ---allergies CID and PID Is this a  behavioral health or substance abuse call? ---No Guidelines Guideline Title Affirmed Question Affirmed Notes Nurse Date/Time (Eastern Time) Influenza (Flu) - Seasonal [1] Difficulty breathing AND [2] not severe AND [3] not from stuffy nose (e.g., not relieved by cleaning out the nose) Deaton, RN, Levada Dy 07/12/2022 9:21:58 AM PLEASE NOTE: All timestamps contained within this report are represented as [place] Standard Time. CONFIDENTIALTY NOTICE: This fax transmission is intended only for the addressee. It contains information that is legally privileged, confidential or otherwise protected from use or disclosure. If you are not the intended recipient, you are strictly prohibited from reviewing, disclosing, copying using or disseminating any of this information or taking any action in reliance on or regarding this information. If you have received this fax in error, please notify us immediately by telephone so that we can arrange for its return to [place]. Phone: [phone], Toll-Free: [phone], Fax: [phone] Page: 2 of 2 Call Id: [id number] [place]. Time Eilene Ghazi Time) Disposition Final User 07/12/2022 9:18:37 AM Send to Urgent Queue Lelon Mast 07/12/2022 9:24:58 AM Go to ED Now (or PCP triage) Yes Rolin Barry, RN, Levada Dy Final Disposition 07/12/2022 9:24:58 AM Go to ED Now (or PCP triage) Yes Deaton, RN, Cindee Lame Disagree/Comply Disagree Caller Understands Yes PreDisposition Did not know what to do Care Advice Given Per Guideline GO TO ED NOW (OR PCP TRIAGE): ANOTHER ADULT SHOULD DRIVE: * It is better and safer if another adult drives instead of you. CARE ADVICE given per Influenza - Seasonal (Adult) guideline. Comments User: Saverio Danker, RN Date/Time Eilene Ghazi Time): 07/12/2022 9:20:47 AM Tested positive for Flu A in [facility]. User: Levada Dy,  Deaton, RN Date/Time Eilene Ghazi Time): 07/12/2022 9:29:52 AM Caller wanted to be seen in the office. Called the backline, spoke to  [place], no appts in the office, no work ins available. Advised for me to tell caller to go to an UC and to apologize that there are no appts. Caller was advised, said that he will go to UC. Referrals GO TO FACILITY UNDECIDE

## 2022-07-12 NOTE — ED Triage Notes (Signed)
Pt. Presents to UC after being diagnosed and treated for the FLU a week ago. Pt. States his symptoms have not gotten better.

## 2022-07-12 NOTE — ED Provider Notes (Addendum)
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    Influenza   Presents to urgent care 1 week after being diagnosed and treated for influenza.  He states his symptoms have not improved.  He endorses shortness of breath and fatigue.  Denies fever, chills, body aches.  Chart indicates treatment for influenza on 07/05/2022 at [facility] ED.  Chest x-ray showed mild atelectasis at left lung base no signs of pneumonia.  Patient was treated with methylprednisolone IM x 2, nebulizer treatment x 2.  Patient states he was also treated with Tamiflu x 5 days and prednisone 50 mg x 5 days and states no improvement in his symptoms.  He returned to the ED on 07/09/2022 with no improvement in symptoms.  Past Medical History:  Diagnosis Date   Allergy    Anginal pain ([place])    BPH (benign prostatic hypertrophy) 6/07   CAD (coronary artery disease) 2010   dx made by coronary calcium scoring, Normal stress test in 2010.    GERD (gastroesophageal reflux disease) 12/05   HLD (hyperlipidemia) 3/99   HTN (hypertension) 2002   no meds   Left rotator cuff tear 08/12/2011   Per Dr. 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   Unstable angina ([place]) 05/27/2020   CAD (coronary artery disease) 05/27/2020   Angina pectoris ([place]) 05/26/2020    Impacted cerumen of both ears 04/23/2018   Acute gastroenteritis 04/12/2017   Left knee pain 09/13/2016   Tympanic membrane perforation, marginal, right 09/02/2016   Bruxism 05/24/2016   Ear fullness, bilateral 05/24/2016   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   Dysfunction of both eustachian tubes 12/02/2015   ETD (eustachian tube dysfunction) 09/24/2015   Advance care planning 10/26/2014   Right rotator cuff tear 05/24/2013   Benign prostatic hyperplasia with urinary obstruction 05/18/2012   Incomplete bladder emptying 05/16/2012   Routine general medical examination at a health care facility 08/12/2011   Left rotator cuff tear 08/12/2011   CAD, NATIVE VESSEL 03/16/2009   UNSPECIFIED HEART DISEASE 34/28/7681   METABOLIC SYNDROME X 15/72/6203   GERD 02/18/2007   ALLERGIC RHINITIS, CHRONIC 02/16/2007   PROSTATITIS, CHRONIC 02/16/2007   BPH (benign prostatic hyperplasia) 2002    Past Surgical History:  Procedure Laterality Date   admitted to [facility] chest pain  12/15/05   Dr. Clayborn Bigness   BLEPHAROPLASTY  10/11   Dr. Loletta Specter    bone spur removed Right    COLONOSCOPY  09/24/04   nml; Dr. Epifanio Lesches    CORONARY ARTERY BYPASS GRAFT N/A 05/29/2020   Procedure: CORONARY ARTERY BYPASS GRAFTING (CABG) TIMES THREE USING LIMA to LAD; ENDOSCOPIC HARVESTED RIGHT GREATER SAPHENOUS VEIN: SVG to PD; SVG to RAMUS.;  Surgeon: Grace Isaac, MD;  Location: [facility] OR;  Service: Open Heart Surgery;  Laterality: N/A;   EGD esophagitis by bx  01/26/06   gastritis bx neg h-pylori   ENDOVEIN HARVEST OF GREATER SAPHENOUS VEIN Right 05/29/2020   Procedure: ENDOVEIN HARVEST OF GREATER SAPHENOUS VEIN;  Surgeon: Grace Isaac, MD;  Location: [place];  Service: Open Heart Surgery;  Laterality: Right;   LEFT HEART CATH AND CORONARY ANGIOGRAPHY N/A 05/26/2020   Procedure: LEFT HEART CATH AND CORONARY ANGIOGRAPHY;  Surgeon: Corey Skains, MD;  Location: [facility];  Service:  Cardiovascular;  Laterality: N/A;   LOWER EXTREMITY ANGIOGRAPHY Right 10/01/2020   Procedure: LOWER EXTREMITY ANGIOGRAPHY;  Surgeon: Algernon Huxley, MD;  Location: [place] [facility];  Service: Cardiovascular;  Laterality: Right;   LOWER EXTREMITY ANGIOGRAPHY Right 08/12/2021   Procedure: LOWER EXTREMITY ANGIOGRAPHY;  Surgeon: Algernon Huxley, MD;  Location: [place] [facility];  Service: Cardiovascular;  Laterality: Right;   LOWER EXTREMITY ANGIOGRAPHY Right 08/23/2021   Procedure: LOWER EXTREMITY ANGIOGRAPHY;  Surgeon: Algernon Huxley, MD;  Location: [place] [facility];  Service: Cardiovascular;  Laterality: Right;   POLYPECTOMY     right and left prostate needle bx  05/02/03   acute inflammation; Dr. Gaynelle Arabian    SHOULDER ARTHROSCOPY W/ ROTATOR CUFF REPAIR  12/2011   left   SHOULDER ARTHROSCOPY WITH ROTATOR CUFF REPAIR AND SUBACROMIAL DECOMPRESSION Right 05/24/2013   Procedure: RIGHT SHOULDER ARTHROSCOPY WITH ROTATOR CUFF REPAIR AND SUBACROMIAL DECOMPRESSION AND PARTIAL ACROMIOPLSTY WITH CORACOACROMIAL RELEASE, DISTAL CLAVICULECTOMY, LABRIAL DEBRIDEMENT;  Surgeon: Johnny Bridge, MD;  Location: [facility];  Service: Orthopedics;  Laterality: Right;   spect ETT nml  12/16/05   TEE WITHOUT CARDIOVERSION N/A 05/29/2020   Procedure: TRANSESOPHAGEAL ECHOCARDIOGRAM (TEE);  Surgeon: Grace Isaac, MD;  Location: [place] [facility];  Service: Open Heart Surgery;  Laterality: N/A;   UPPER GASTROINTESTINAL ENDOSCOPY     WISDOM TOOTH EXTRACTION         Home Medications    Prior to Admission medications   Medication Sig Start Date End Date Taking? Authorizing Provider  Azelastine-Fluticasone 137-50 MCG/ACT SUSP Place 1 spray into both nostrils 2 (two) times daily. 12/16/19   [provider]  Cholecalciferol (VITAMIN D) 2000 UNITS CAPS Take 1 capsule by mouth daily.    [provider]  clopidogrel (PLAVIX) 75 MG tablet Take 1 tablet (75 mg total) by mouth daily. 08/23/21   Algernon Huxley, MD  Cyanocobalamin (VITAMIN B-12) 5000 MCG SUBL Place 1 tablet under the tongue daily.    [provider]  icosapent Ethyl (VASCEPA) 1 g capsule Take 1 tablet by mouth 4 (four) times daily.    [provider]  meloxicam (MOBIC) 15 MG tablet Take 15 mg by mouth as needed. 10/06/21   [provider]  rosuvastatin (CRESTOR) 40 MG tablet Take 40 mg by mouth daily.    [provider]  tadalafil (CIALIS) 5 MG tablet Take by mouth. 02/02/15   [provider]    Family History Family History  Problem Relation Age of Onset   Stroke Mother    Glaucoma Other        grandmother at 25   Barrett's esophagus Maternal Uncle    Prostate cancer Neg Hx    Colon cancer Neg Hx    Colon polyps Neg Hx    Esophageal cancer Neg Hx    Rectal cancer Neg Hx    Stomach cancer Neg Hx  Social History Social History   Tobacco Use   Smoking status: Never   Smokeless tobacco: Never  Vaping Use   Vaping Use: Never used  Substance Use Topics   Alcohol use: Yes    Alcohol/week: 0.0 standard drinks of alcohol    Comment: beer, occ   Drug use: No     Allergies   Ciprofloxacin hcl and Quinolones   Review of Systems Review of Systems   Physical Exam Triage Vital Signs ED Triage Vitals  Enc Vitals Group     BP 07/12/22 1059 (!) 149/89     Pulse Rate 07/12/22 1059 79     Resp 07/12/22 1059 17     Temp 07/12/22 1059 97.9 F (36.6 C)     Temp src --      SpO2 07/12/22 1059 95 %     Weight --      Height --      Head Circumference --      Peak Flow --      Pain Score 07/12/22 1100 0     Pain Loc --      Pain Edu? --      Excl. in [place]? --    No data found.  Updated Vital Signs BP (!) 149/89   Pulse 79   Temp 97.9 F (36.6 C)   Resp 17   SpO2 95%   Visual Acuity Right Eye Distance:   Left Eye Distance:   Bilateral Distance:    Right Eye Near:   Left Eye Near:    Bilateral Near:     Physical Exam Vitals reviewed.   Constitutional:      Appearance: Normal appearance. He is not ill-appearing.  Cardiovascular:     Rate and Rhythm: Normal rate and regular rhythm.     Pulses: Normal pulses.     Heart sounds: Normal heart sounds.  Pulmonary:     Effort: Pulmonary effort is normal.     Breath sounds: Examination of the right-upper field reveals rhonchi. Examination of the right-middle field reveals rhonchi. Examination of the right-lower field reveals rhonchi. Examination of the left-lower field reveals rhonchi. Rhonchi present.  Skin:    General: Skin is warm and dry.  Neurological:     General: No focal deficit present.     Mental Status: He is alert and oriented to person, place, and time.  Psychiatric:        Mood and Affect: Mood normal.        Behavior: Behavior normal.      UC Treatments / Results  Labs (all labs ordered are listed, but only abnormal results are displayed) Labs Reviewed - No data to display  EKG   Radiology No results found.  Procedures Procedures (including critical care time)  Medications Ordered in UC Medications - No data to display  Initial Impression / Assessment and Plan / UC Course  I have reviewed the triage vital signs and the nursing notes.  Pertinent labs & imaging results that were available during my care of the patient were reviewed by me and considered in my medical decision making (see chart for details).   Patient is afebrile here without recent antipyretics. Satting 95% on room air. Overall is well appearing, well hydrated, without respiratory distress. Pulmonary exam is remarkable for coarse rhonchi on the right side and left lower lobes.  Suspect possible secondary lower respiratory infection, possibly CAP given his adventitious lung sounds versus exacerbation of COPD.  Given he endorses improvement  with albuterol via neb administered in the ED, will give breathing treatment in clinic and assess response.  He endorses good response to  albuterol administered via neb.  He has albuterol inhaler at home and has been using.  He is allergic to fluoroquinolones and so will treat with combo of azithromycin and Augmentin for CAP.  Inform patient that next visit would be in the ED if his symptoms do not resolve as he is intolerant of levofloxacin.  Asking him also to follow-up with his primary care provider for possible evaluation needed by pulmonology.  Final Clinical Impressions(s) / UC Diagnoses   Final diagnoses:  None   Discharge Instructions   None    ED Prescriptions   None    PDMP not reviewed this encounter.   Rose Phi, [place] 07/12/22 Leslie    ImmordinoAnnie Main, [place] 07/12/22 1141

## 2022-07-29 ENCOUNTER — Telehealth: Payer: Self-pay | Admitting: *Deleted

## 2022-07-29 NOTE — Telephone Encounter (Signed)
   Pre-operative Risk Assessment    Patient Name: Clayton Powell, Dr.  DOB: 1946-04-05 MRN: [id number]      Request for Surgical Clearance    Procedure:   LEFT PARTIAL KNEE ARTHROPLASTY   Date of Surgery:  Clearance TBD                                 Surgeon:  DR. Marchia Bond Surgeon's Group or Practice Name:  [facility] Phone number:  [phone] EXT [phone] ATTN: [name] Fax number:  [phone]    Type of Clearance Requested:   - Medical  - Pharmacy:  Hold Clopidogrel (Plavix)     Type of Anesthesia:  Spinal   Additional requests/questions:    [name]   07/29/2022, 5:04 PM

## 2022-08-01 ENCOUNTER — Telehealth: Payer: Self-pay | Admitting: Family Medicine

## 2022-08-01 NOTE — Telephone Encounter (Signed)
After reviewing the chart, it seems the Dr. Debara Pickett only follows the pt for hyperlipidemia. Seems that the pt is followed by Dr. Nehemiah Massed with [facility] cardiology. Clearance will need to come from DR. Nehemiah Massed office.

## 2022-08-01 NOTE — Telephone Encounter (Signed)
Form has been placed in Dr. Josefine Class box

## 2022-08-01 NOTE — Telephone Encounter (Signed)
Routed to wrong pool. My apologies!

## 2022-08-01 NOTE — Telephone Encounter (Signed)
Pt called stating he'll be having a knee surgery soon. Pt wanted to let Damita Dunnings know that the surgeon will be sending over ppw for Damita Dunnings to complete to approve the surgery. Call back # [phone]

## 2022-08-02 NOTE — Telephone Encounter (Signed)
I will work on the hardcopy when possible.  Thanks.

## 2022-08-19 NOTE — Progress Notes (Signed)
Sent message, via epic in basket, requesting orders in epic from surgeon.  

## 2022-08-23 NOTE — Patient Instructions (Addendum)
DUE TO COVID-19 ONLY TWO VISITORS  (aged 77 and older)  ARE ALLOWED TO COME WITH YOU AND STAY IN THE WAITING ROOM ONLY DURING PRE OP AND PROCEDURE.   **NO VISITORS ARE ALLOWED IN THE SHORT STAY AREA OR RECOVERY ROOM!!**  IF YOU WILL BE ADMITTED INTO THE HOSPITAL YOU ARE ALLOWED ONLY FOUR SUPPORT PEOPLE DURING VISITATION HOURS ONLY (7 AM -8PM)   The support person(s) must pass our screening, gel in and out, and wear a mask at all times, including in the patient's room. Patients must also wear a mask when staff or their support person are in the room. Visitors GUEST BADGE MUST BE WORN VISIBLY  One adult visitor may remain with you overnight and MUST be in the room by 8 P.M.     Your procedure is scheduled on: 09/06/22   Report to [facility] Main Entrance    Report to admitting at 9:15 AM   Call this number if you have problems the morning of surgery [phone]   Do not eat food :After Midnight.   After Midnight you may have the following liquids until _8:45_AM/ DAY OF SURGERY  Water Black Coffee (sugar ok, NO MILK/CREAM OR CREAMERS)  Tea (sugar ok, NO MILK/CREAM OR CREAMERS) regular and decaf                             Plain Jell-O (NO RED)                                           Fruit ices (not with fruit pulp, NO RED)                                     Popsicles (NO RED)                                                                  Juice: apple, WHITE grape, WHITE cranberry Sports drinks like Gatorade (NO RED)                  The day of surgery:  Drink ONE (1) Pre-Surgery Clear Ensure  at 8:30 AM the morning of surgery. Drink in one sitting. Do not sip.  This drink was given to you during your hospital  pre-op appointment visit. Nothing else to drink after completing the  Pre-Surgery Clear Ensure at 8:45 AM          If you have questions, please contact your surgeon's office.       Oral Hygiene is also important to reduce your risk of infection.                                     Remember - BRUSH YOUR TEETH THE MORNING OF SURGERY WITH YOUR REGULAR TOOTHPASTE  DENTURES WILL BE REMOVED PRIOR TO SURGERY PLEASE DO NOT APPLY "Poly grip" OR ADHESIVES!!!   Do NOT smoke after Midnight   Take these medicines  the morning of surgery with A SIP OF WATER: Icosapent ethyl- VA scepa                                                                                                                            Rosuvastatin   Before surgery.Stop taking _Plavix - hold 5 days prior to day of surgery (120hrs)_. Last dose__on __2/7/24___as instructed by _____________.  Contact your Surgeon/Cardiologist for instructions on Anticoagulant Therapy prior to surgery.   Bring CPAP mask and tubing day of surgery.                              You may not have any metal on your body including  jewelry, and body piercing             Do not wear  lotions, powders, cologne, or deodorant  . Men may shave face and neck.    Do not bring valuables to the hospital. [place].              Contacts, glasses, or bridgework may not be worn into surgery.   Bring small overnight bag day of surgery.   DO NOT [place].     Patients discharged on the day of surgery will not be allowed to drive home.  Someone NEEDS to stay with you for the first 24 hours after anesthesia.    Special Instructions: Bring a copy of your healthcare power of attorney and living will documents  the day of surgery if you haven't scanned them before.               Please read over the following fact sheets you were given: IF YOU HAVE QUESTIONS ABOUT YOUR PRE-OP INSTRUCTIONS PLEASE CALL [phone]     [facility] - Preparing for Surgery Before surgery, you can play an important role.  Because skin is not sterile, your skin needs to be as free of germs as possible.  You can reduce the number of germs on your skin by  washing with CHG (chlorahexidine gluconate) soap before surgery.  CHG is an antiseptic cleaner which kills germs and bonds with the skin to continue killing germs even after washing. Please DO NOT use if you have an allergy to CHG or antibacterial soaps.  If your skin becomes reddened/irritated stop using the CHG and inform your nurse when you arrive at Short Stay..  You may shave your face/neck.  Please follow these instructions carefully:  1.  Shower with CHG Soap the night before surgery and the  morning of Surgery.  2.  If you choose to wash your hair, wash your hair first as usual with your  normal  shampoo.  3.  After you shampoo, rinse your hair and body thoroughly to remove  the  shampoo.                            4.  Use CHG as you would any other liquid soap.  You can apply chg directly  to the skin and wash                       Gently with a scrungie or clean washcloth.  5.  Apply the CHG Soap to your body ONLY FROM THE NECK DOWN.   Do not use on face/ open                           Wound or open sores. Avoid contact with eyes, ears mouth and genitals (private parts).                       Wash face,  Genitals (private parts) with your normal soap.             6.  Wash thoroughly, paying special attention to the area where your surgery  will be performed.  7.  Thoroughly rinse your body with warm water from the neck down.  8.  DO NOT shower/wash with your normal soap after using and rinsing off  the CHG Soap.             9.  Pat yourself dry with a clean towel.            10.  Wear clean pajamas.            11.  Place clean sheets on your bed the night of your first shower and do not  sleep with pets. Day of Surgery : Do not apply any lotions/deodorants the morning of surgery.  Please wear clean clothes to the hospital/surgery center.  FAILURE TO FOLLOW THESE INSTRUCTIONS MAY RESULT IN THE CANCELLATION OF YOUR  SURGERY   ________________________________________________________________________  Incentive Spirometer  An incentive spirometer is a tool that can help keep your lungs clear and active. This tool measures how well you are filling your lungs with each breath. Taking long deep breaths may help reverse or decrease the chance of developing breathing (pulmonary) problems (especially infection) following: A long period of time when you are unable to move or be active. BEFORE THE PROCEDURE  If the spirometer includes an indicator to show your best effort, your nurse or respiratory therapist will set it to a desired goal. If possible, sit up straight or lean slightly forward. Try not to slouch. Hold the incentive spirometer in an upright position. INSTRUCTIONS FOR USE  Sit on the edge of your bed if possible, or sit up as far as you can in bed or on a chair. Hold the incentive spirometer in an upright position. Breathe out normally. Place the mouthpiece in your mouth and seal your lips tightly around it. Breathe in slowly and as deeply as possible, raising the piston or the ball toward the top of the column. Hold your breath for 3-5 seconds or for as long as possible. Allow the piston or ball to fall to the bottom of the column. Remove the mouthpiece from your mouth and breathe out normally. Rest for a few seconds and repeat Steps 1 through 7 at least 10 times every 1-2 hours when you are awake. Take your time and take a few normal breaths between deep  breaths. The spirometer may include an indicator to show your best effort. Use the indicator as a goal to work toward during each repetition. After each set of 10 deep breaths, practice coughing to be sure your lungs are clear. If you have an incision (the cut made at the time of surgery), support your incision when coughing by placing a pillow or rolled up towels firmly against it. Once you are able to get out of bed, walk around indoors and cough  well. You may stop using the incentive spirometer when instructed by your caregiver.  RISKS AND COMPLICATIONS Take your time so you do not get dizzy or light-headed. If you are in pain, you may need to take or ask for pain medication before doing incentive spirometry. It is harder to take a deep breath if you are having pain. AFTER USE Rest and breathe slowly and easily. It can be helpful to keep track of a log of your progress. Your caregiver can provide you with a simple table to help with this. If you are using the spirometer at home, follow these instructions: [place] IF:  You are having difficultly using the spirometer. You have trouble using the spirometer as often as instructed. Your pain medication is not giving enough relief while using the spirometer. You develop fever of 100.5 F (38.1 C) or higher. SEEK IMMEDIATE MEDICAL CARE IF:  You cough up bloody sputum that had not been present before. You develop fever of 102 F (38.9 C) or greater. You develop worsening pain at or near the incision site. MAKE SURE YOU:  Understand these instructions. Will watch your condition. Will get help right away if you are not doing well or get worse. Document Released: 11/21/2006 Document Revised: 10/03/2011 Document Reviewed: 01/22/2007 [facility] Patient Information 2014 [place], [place].   ________________________________________________________________________

## 2022-08-26 ENCOUNTER — Encounter (HOSPITAL_COMMUNITY)
Admission: RE | Admit: 2022-08-26 | Discharge: 2022-08-26 | Disposition: A | Discharge status: Home / Self Care | Payer: Medicare Other | Source: Ambulatory Visit | Attending: Orthopedic Surgery | Admitting: Orthopedic Surgery

## 2022-08-26 ENCOUNTER — Encounter (HOSPITAL_COMMUNITY): Payer: Self-pay

## 2022-08-26 ENCOUNTER — Other Ambulatory Visit: Payer: Self-pay

## 2022-08-26 VITALS — BP 168/90 | HR 63 | Temp 98.5°F | Resp 20 | Ht 67.0 in | Wt 200.0 lb

## 2022-08-26 DIAGNOSIS — I10 Essential (primary) hypertension: Secondary | ICD-10-CM | POA: Diagnosis not present

## 2022-08-26 DIAGNOSIS — Z01818 Encounter for other preprocedural examination: Secondary | ICD-10-CM | POA: Insufficient documentation

## 2022-08-26 HISTORY — DX: Unspecified osteoarthritis, unspecified site: M19.90

## 2022-08-26 LAB — BASIC METABOLIC PANEL
Anion gap: 6 (ref 5–15)
BUN: 19 mg/dL (ref 8–23)
CO2: 23 mmol/L (ref 22–32)
Calcium: 9 mg/dL (ref 8.9–10.3)
Chloride: 107 mmol/L (ref 98–111)
Creatinine, Ser: 0.9 mg/dL (ref 0.61–1.24)
GFR, Estimated: >60 mL/min (ref >60)
Glucose, Bld: 100 mg/dL — ABNORMAL HIGH (ref 70–99)
Potassium: 4.3 mmol/L (ref 3.5–5.1)
Sodium: 136 mmol/L (ref 135–145)

## 2022-08-26 LAB — CBC
HCT: 44 % (ref 39.0–52.0)
Hemoglobin: 14.6 g/dL (ref 13.0–17.0)
MCH: 31.8 pg (ref 26.0–34.0)
MCHC: 33.2 g/dL (ref 30.0–36.0)
MCV: 95.9 fL (ref 80.0–100.0)
Platelets: 195 10*3/uL (ref 150–400)
RBC: 4.59 MIL/uL (ref 4.22–5.81)
RDW: 13 % (ref 11.5–15.5)
WBC: 6.3 10*3/uL (ref 4.0–10.5)
nRBC: 0 % (ref 0.0–0.2)

## 2022-08-26 LAB — SURGICAL PCR SCREEN
MRSA, PCR: NEGATIVE
Staphylococcus aureus: NEGATIVE

## 2022-08-26 NOTE — Progress Notes (Signed)
Anesthesia note:  Bowel prep reminder: NA   PCP - Dr. Orlinda Blalock Cardiologist -Dr. Jacinto Reap. Kowalski LOV 03/31/22 Other-   Chest x-ray - 2021 EKG - 08/26/22-chart Stress Test -  ECHO - 05/29/20 epic Cardiac Cath - 08/23/21 peripheral vascular cath with stent CABG-2021 Pacemaker/ICD device last checked:NA  Sleep Study - no CPAP - no  Pt is pre diabetic-NA CBG at PAT visit- Fasting Blood Sugar at home- Checks Blood Sugar _____  Blood Thinner:Plavix for a-flutter Blood Thinner Instructions:hold for 5 days last dose 08/31/22 Aspirin Instructions: Last Dose:  Anesthesia review: Yes reason:Cardiac  Patient denies shortness of breath, fever, cough and chest pain at PAT appointment. Pt has no SOB with any activities. He had a respiratory  illness last month but feels good now.   Patient verbalized understanding of instructions that were given to them at the PAT appointment. Patient was also instructed that they will need to review over the PAT instructions again at home before surgery.yes

## 2022-08-30 NOTE — Progress Notes (Signed)
Anesthesia Chart Review   Case: Clayton Powell Date/Time: 09/06/22 1130   Procedure: UNICOMPARTMENTAL KNEE (Left: Knee)   Anesthesia type: Choice   Pre-op diagnosis: DJD LEFT KNEE   Location: [facility] ROOM 07 / [facility]   Surgeons: Marchia Bond, MD       DISCUSSION:77 y.o. never smoker with h/o HTN, GERD, CAD (CABG x3 05/29/2020), PAD s/p right SFA and stent placement, left knee djd scheduled for above procedure 09/06/2022 with Dr. Marchia Bond.   Pt last seen by cardiology 03/31/2022. Stable at this visit with one year follow up recommended.   Per surgeon's office clearance received from cardio, notes requested.  VS: BP (!) 168/90   Pulse 63   Temp 36.9 C (Oral)   Resp 20   Ht 5' 7"$  (1.702 m)   Wt 90.7 kg   SpO2 97%   BMI 31.32 kg/m   PROVIDERS: Tonia Ghent, MD is PCP    LABS: Labs reviewed: Acceptable for surgery. (all labs ordered are listed, but only abnormal results are displayed)  Labs Reviewed  BASIC METABOLIC PANEL - Abnormal; Notable for the following components:      Result Value   Glucose, Bld 100 (*)    All other components within normal limits  SURGICAL PCR SCREEN  CBC     IMAGES:   EKG:   CV: Echo 05/26/20 1. Left ventricular ejection fraction, by estimation, is 60 to 65%. The  left ventricle has normal function. The left ventricle has no regional  wall motion abnormalities. Left ventricular diastolic parameters were  normal.   2. Right ventricular systolic function is normal. The right ventricular  size is normal.   3. The mitral valve is normal in structure. Trivial mitral valve  regurgitation.   4. The aortic valve is normal in structure. Aortic valve regurgitation is  not visualized.  Past Medical History:  Diagnosis Date   Allergy    Anginal pain ([place])    Arthritis    BPH (benign prostatic hypertrophy) 12/2005   CAD (coronary artery disease) 2010   dx made by coronary calcium scoring, Normal stress test in 2010.    GERD (gastroesophageal  reflux disease) 06/2004   HLD (hyperlipidemia) 09/1997   HTN (hypertension) 2002   no meds   Left rotator cuff tear 08/12/2011   Per Dr. Mardelle Matte 2013 L rotator cuff tear on MRI 2013    Right rotator cuff tear 05/24/2013   Wears glasses     Past Surgical History:  Procedure Laterality Date   admitted to [facility] chest pain  12/15/05   Dr. Clayborn Bigness   BLEPHAROPLASTY  10/11   Dr. Loletta Specter    bone spur removed Right    COLONOSCOPY  09/24/04   nml; Dr. Epifanio Lesches    CORONARY ARTERY BYPASS GRAFT N/A 05/29/2020   Procedure: CORONARY ARTERY BYPASS GRAFTING (CABG) TIMES THREE USING LIMA to LAD; ENDOSCOPIC HARVESTED RIGHT GREATER SAPHENOUS VEIN: SVG to PD; SVG to RAMUS.;  Surgeon: Grace Isaac, MD;  Location: [place];  Service: Open Heart Surgery;  Laterality: N/A;   EGD esophagitis by bx  01/26/06   gastritis bx neg h-pylori   ENDOVEIN HARVEST OF GREATER SAPHENOUS VEIN Right 05/29/2020   Procedure: ENDOVEIN HARVEST OF GREATER SAPHENOUS VEIN;  Surgeon: Grace Isaac, MD;  Location: [place];  Service: Open Heart Surgery;  Laterality: Right;   LEFT HEART CATH AND CORONARY ANGIOGRAPHY N/A 05/26/2020   Procedure: LEFT HEART CATH AND CORONARY ANGIOGRAPHY;  Surgeon: Corey Skains, MD;  Location: [place] CV LAB;  Service: Cardiovascular;  Laterality: N/A;   LOWER EXTREMITY ANGIOGRAPHY Right 10/01/2020   Procedure: LOWER EXTREMITY ANGIOGRAPHY;  Surgeon: Algernon Huxley, MD;  Location: [place] CV LAB;  Service: Cardiovascular;  Laterality: Right;   LOWER EXTREMITY ANGIOGRAPHY Right 08/12/2021   Procedure: LOWER EXTREMITY ANGIOGRAPHY;  Surgeon: Algernon Huxley, MD;  Location: [place] [facility];  Service: Cardiovascular;  Laterality: Right;   LOWER EXTREMITY ANGIOGRAPHY Right 08/23/2021   Procedure: LOWER EXTREMITY ANGIOGRAPHY;  Surgeon: Algernon Huxley, MD;  Location: [place] CV LAB;  Service: Cardiovascular;  Laterality: Right;   POLYPECTOMY     right and left prostate needle bx  05/02/03   acute  inflammation; Dr. Gaynelle Arabian    SHOULDER ARTHROSCOPY W/ ROTATOR CUFF REPAIR  12/2011   left   SHOULDER ARTHROSCOPY WITH ROTATOR CUFF REPAIR AND SUBACROMIAL DECOMPRESSION Right 05/24/2013   Procedure: RIGHT SHOULDER ARTHROSCOPY WITH ROTATOR CUFF REPAIR AND SUBACROMIAL DECOMPRESSION AND PARTIAL ACROMIOPLSTY WITH CORACOACROMIAL RELEASE, DISTAL CLAVICULECTOMY, LABRIAL DEBRIDEMENT;  Surgeon: Johnny Bridge, MD;  Location: [place];  Service: Orthopedics;  Laterality: Right;   spect ETT nml  12/16/05   TEE WITHOUT CARDIOVERSION N/A 05/29/2020   Procedure: TRANSESOPHAGEAL ECHOCARDIOGRAM (TEE);  Surgeon: Grace Isaac, MD;  Location: [place];  Service: Open Heart Surgery;  Laterality: N/A;   UPPER GASTROINTESTINAL ENDOSCOPY     WISDOM TOOTH EXTRACTION      MEDICATIONS:  acetaminophen (TYLENOL) 500 MG tablet   albuterol (VENTOLIN HFA) 108 (90 Base) MCG/ACT inhaler   Azelastine-Fluticasone 137-50 MCG/ACT SUSP   Cholecalciferol (VITAMIN D) 2000 UNITS CAPS   clopidogrel (PLAVIX) 75 MG tablet   Cyanocobalamin (VITAMIN B-12) 5000 MCG SUBL   icosapent Ethyl (VASCEPA) 1 g capsule   loratadine (CLARITIN) 10 MG tablet   meloxicam (MOBIC) 15 MG tablet   montelukast (SINGULAIR) 10 MG tablet   rosuvastatin (CRESTOR) 40 MG tablet   SYMBICORT 80-4.5 MCG/ACT inhaler   tadalafil (CIALIS) 5 MG tablet   No current facility-administered medications for this encounter.    Konrad Felix Ward, PA-C [facility] Pre-Surgical Testing [phone]

## 2022-09-05 NOTE — Anesthesia Preprocedure Evaluation (Signed)
Anesthesia Evaluation  Patient identified by MRN, date of birth, ID band Patient awake    Reviewed: Allergy & Precautions, NPO status , Patient's Chart, lab work & pertinent test results  History of Anesthesia Complications Negative for: history of anesthetic complications  Airway Mallampati: III  TM Distance: >3 FB Neck ROM: Full    Dental no notable dental hx.    Pulmonary neg pulmonary ROS   Pulmonary exam normal        Cardiovascular hypertension, + CAD and + Peripheral Vascular Disease  Normal cardiovascular exam     Neuro/Psych negative neurological ROS  negative psych ROS   GI/Hepatic Neg liver ROS,GERD  ,,  Endo/Other  negative endocrine ROS    Renal/GU negative Renal ROS  negative genitourinary   Musculoskeletal negative musculoskeletal ROS (+)    Abdominal   Peds  Hematology negative hematology ROS (+)   Anesthesia Other Findings Day of surgery medications reviewed with patient.  Reproductive/Obstetrics negative OB ROS                              Anesthesia Physical Anesthesia Plan  ASA: 2  Anesthesia Plan: Spinal   Post-op Pain Management: Tylenol PO (pre-op)* and Regional block*   Induction:   PONV Risk Score and Plan: 2 and Treatment may vary due to age or medical condition, Ondansetron, Propofol infusion and Dexamethasone  Airway Management Planned: Natural Airway and Simple Face Mask  Additional Equipment: None  Intra-op Plan:   Post-operative Plan:   Informed Consent: I have reviewed the patients History and Physical, chart, labs and discussed the procedure including the risks, benefits and alternatives for the proposed anesthesia with the patient or authorized representative who has indicated his/her understanding and acceptance.       Plan Discussed with: CRNA  Anesthesia Plan Comments:         Anesthesia Quick Evaluation

## 2022-09-05 NOTE — Progress Notes (Signed)
Pt aware of surgical time change and to arrive at [facility] admitting at [place] on Tuesday 09/06/2022. No food after midnight; clear liquids from midnight till 0730 consuming entire pre surgery drink by 0730 then nothing by mouth.

## 2022-09-05 NOTE — H&P (Signed)
KNEE ARTHROPLASTY ADMISSION H&P  Patient ID: RONNE MEZGER, Dr. MRN: [id number] DOB/AGE: [id number] y.o.  Chief Complaint: left knee pain.  Planned Procedure Date: 09/06/22  Medical Clearance by Dr. Damita Dunnings Cardiac Clearance by Dr. 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 The patient has a history of pain and functional disability in the left knee due to arthritis and has failed non-surgical conservative treatments for greater than 12 weeks to include NSAID's and/or analgesics, corticosteriod injections, viscosupplementation injections, and activity modification.  Onset of symptoms was gradual, starting 6 years ago with gradually worsening course since that time. The patient noted no past surgery on the left knee.  Patient currently rates pain at 1 out of 10 with walking, but pain is severe with any twisting motion.. Patient has worsening of pain with activity and weight bearing and pain that interferes with activities of daily living.  Patient has evidence of joint space narrowing by imaging studies.  There is no active infection.  Past Medical History:  Diagnosis Date   Allergy    Anginal pain ([place])    Arthritis    BPH (benign prostatic hypertrophy) 12/2005   CAD (coronary artery disease) 2010   dx made by coronary calcium scoring, Normal stress test in 2010.    GERD (gastroesophageal reflux disease) 06/2004   HLD (hyperlipidemia) 09/1997   HTN (hypertension) 2002   no meds   Left rotator cuff tear 08/12/2011   Per Dr. Mardelle Matte 2013 L rotator cuff tear on MRI 2013    Right rotator cuff tear 05/24/2013   Wears glasses    Past Surgical History:  Procedure Laterality Date   admitted to [facility] chest pain  12/15/05   Dr. Clayborn Bigness   BLEPHAROPLASTY  10/11   Dr. Loletta Specter    bone spur removed Right    COLONOSCOPY  09/24/04   nml; Dr. Epifanio Lesches    CORONARY ARTERY BYPASS GRAFT N/A 05/29/2020   Procedure: CORONARY ARTERY BYPASS  GRAFTING (CABG) TIMES THREE USING LIMA to LAD; ENDOSCOPIC HARVESTED RIGHT GREATER SAPHENOUS VEIN: SVG to PD; SVG to RAMUS.;  Surgeon: Grace Isaac, MD;  Location: [place];  Service: Open Heart Surgery;  Laterality: N/A;   EGD esophagitis by bx  01/26/06   gastritis bx neg h-pylori   ENDOVEIN HARVEST OF GREATER SAPHENOUS VEIN Right 05/29/2020   Procedure: ENDOVEIN HARVEST OF GREATER SAPHENOUS VEIN;  Surgeon: Grace Isaac, MD;  Location: [place];  Service: Open Heart Surgery;  Laterality: Right;   LEFT HEART CATH AND CORONARY ANGIOGRAPHY N/A 05/26/2020   Procedure: LEFT HEART CATH AND CORONARY ANGIOGRAPHY;  Surgeon: Corey Skains, MD;  Location: [place] [facility];  Service: Cardiovascular;  Laterality: N/A;   LOWER EXTREMITY ANGIOGRAPHY Right 10/01/2020   Procedure: LOWER EXTREMITY ANGIOGRAPHY;  Surgeon: Algernon Huxley, MD;  Location: [place] [facility];  Service: Cardiovascular;  Laterality: Right;   LOWER EXTREMITY ANGIOGRAPHY Right 08/12/2021   Procedure: LOWER EXTREMITY ANGIOGRAPHY;  Surgeon: Algernon Huxley, MD;  Location: [place] [facility];  Service: Cardiovascular;  Laterality: Right;   LOWER EXTREMITY ANGIOGRAPHY Right 08/23/2021   Procedure: LOWER EXTREMITY ANGIOGRAPHY;  Surgeon: Algernon Huxley, MD;  Location: [place] [facility];  Service: Cardiovascular;  Laterality: Right;   POLYPECTOMY     right and left prostate needle bx  05/02/03   acute inflammation; Dr. Gaynelle Arabian    SHOULDER ARTHROSCOPY W/ ROTATOR CUFF REPAIR  12/2011  left   SHOULDER ARTHROSCOPY WITH ROTATOR CUFF REPAIR AND SUBACROMIAL DECOMPRESSION Right 05/24/2013   Procedure: RIGHT SHOULDER ARTHROSCOPY WITH ROTATOR CUFF REPAIR AND SUBACROMIAL DECOMPRESSION AND PARTIAL ACROMIOPLSTY WITH CORACOACROMIAL RELEASE, DISTAL CLAVICULECTOMY, LABRIAL DEBRIDEMENT;  Surgeon: Johnny Bridge, MD;  Location: [place];  Service: Orthopedics;  Laterality: Right;   spect ETT nml  12/16/05   TEE WITHOUT CARDIOVERSION N/A  05/29/2020   Procedure: TRANSESOPHAGEAL ECHOCARDIOGRAM (TEE);  Surgeon: Grace Isaac, MD;  Location: [place];  Service: Open Heart Surgery;  Laterality: N/A;   UPPER GASTROINTESTINAL ENDOSCOPY     WISDOM TOOTH EXTRACTION     Allergies  Allergen Reactions   Ciprofloxacin Hcl Rash   Quinolones Rash   Prior to Admission medications   Medication Sig Start Date End Date Taking? Authorizing Provider  acetaminophen (TYLENOL) 500 MG tablet Take 1,000 mg by mouth every 6 (six) hours as needed for moderate pain or mild pain.   Yes [provider]  albuterol (VENTOLIN HFA) 108 (90 Base) MCG/ACT inhaler Inhale 2 puffs into the lungs every 4 (four) hours as needed for wheezing or shortness of breath. 07/06/22  Yes [provider]  Azelastine-Fluticasone 137-50 MCG/ACT SUSP Place 1 spray into both nostrils daily as needed (Congestion). 12/16/19  Yes [provider]  Cholecalciferol (VITAMIN D) 2000 UNITS CAPS Take 2,000 Units by mouth daily.   Yes [provider]  clopidogrel (PLAVIX) 75 MG tablet Take 1 tablet (75 mg total) by mouth daily. 08/23/21  Yes Dew, Erskine Squibb, MD  Cyanocobalamin (VITAMIN B-12) 5000 MCG SUBL Place 5,000 mcg under the tongue daily.   Yes [provider]  icosapent Ethyl (VASCEPA) 1 g capsule Take 1 tablet by mouth 4 (four) times daily.   Yes [provider]  loratadine (CLARITIN) 10 MG tablet Take 10 mg by mouth daily.   Yes [provider]  meloxicam (MOBIC) 15 MG tablet Take 15 mg by mouth as needed for pain. 10/06/21  Yes [provider]  montelukast (SINGULAIR) 10 MG tablet Take 10 mg by mouth at bedtime.   Yes [provider]  rosuvastatin (CRESTOR) 40 MG tablet Take 40 mg by mouth daily.   Yes [provider]  SYMBICORT 80-4.5 MCG/ACT inhaler Inhale 2 puffs into the lungs 2 (two) times daily. 07/21/22  Yes [provider]  tadalafil (CIALIS) 5 MG tablet Take 5 mg by mouth daily.  02/02/15  Yes [provider]   Social History   Socioeconomic History   Marital status: Married    Spouse name: Marlowe Kays   Number of children: 2   Years of education: Not on file   Highest education level: Not on file  Occupational History   Not on file  Tobacco Use   Smoking status: Never   Smokeless tobacco: Never  Vaping Use   Vaping Use: Never used  Substance and Sexual Activity   Alcohol use: Yes    Alcohol/week: 0.0 standard drinks of alcohol    Comment: beer, occ   Drug use: No   Sexual activity: Yes  Other Topics Concern   Not on file  Social History Narrative   Married, 2 children - one is local   Married 40+ years   [place]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    Plays guitar   Works out regularly.   Social Determinants of Health   Financial Resource Strain: Not on file  Food Insecurity: Not on file  Transportation Needs: Not on file  Physical Activity: Not on file  Stress: Not on file  Social Connections: Not on file   Family History  Problem Relation Age of Onset   Stroke Mother    Glaucoma Other        grandmother at 54   Barrett's esophagus Maternal Uncle    Prostate cancer Neg Hx    Colon cancer Neg Hx    Colon polyps Neg Hx    Esophageal cancer Neg Hx    Rectal cancer Neg Hx    Stomach cancer Neg Hx     ROS: Currently denies lightheadedness, dizziness, Fever, chills, CP, SOB.   No personal history of DVT, PE, MI, or CVA. No loose teeth or dentures. All other systems have been reviewed and were otherwise currently negative with the exception of those mentioned in the HPI and as above.  Objective: Vitals: Height: 5?7?Marland Kitchen  Weight: 203.6 pounds.  Blood pressure: 159/96.  O2 SAT: 95% on room air.  Temperature: 97.8.  Pulse: 70.    Physical Exam: General: Alert, NAD.  Antalgic Gait  HEENT: EOMI, Good Neck Extension  Pulm: No increased work of breathing.  Clear B/L A/P w/o crackle or wheeze.  CV: RRR, No m/g/r appreciated  GI: soft,  NT, ND Neuro: Neuro without gross focal deficit.  Sensation intact distally Skin: No lesions in the area of chief complaint MSK/Surgical Site: left knee w/o redness or effusion.  no JLT. ROM 0-120. No patellar grind.  5/5 strength in extension and flexion.  +EHL/FHL.  NVI.  Stable varus and valgus stress.    Imaging Review Plain radiographs demonstrate severe degenerative joint disease of the left knee.   The overall alignment is varus. The bone quality appears to be adequate for age and reported activity level.  Preoperative templating of the joint replacement has been completed, documented, and submitted to the Operating Room personnel in order to optimize intra-operative equipment management.  Assessment: Left anteromedial knee osteoarthritis   Plan: Plan for Procedure(s): UNICOMPARTMENTAL KNEE  The patient history, physical exam, clinical judgement of the provider and imaging are consistent with end stage degenerative joint disease and unicompartmental joint arthroplasty is deemed medically necessary. The treatment options including medical management, injection therapy, and arthroplasty were discussed at length. The risks and benefits of Procedure(s): UNICOMPARTMENTAL KNEE were presented and reviewed.  The risks of nonoperative treatment, versus surgical intervention including but not limited to continued pain, aseptic loosening, stiffness, dislocation/subluxation, infection, bleeding, nerve injury, blood clots, cardiopulmonary complications, morbidity, mortality, among others were discussed. The patient verbalizes understanding and wishes to proceed with the plan.  Patient is being admitted for inpatient treatment for surgery, pain control, PT, prophylactic antibiotics, VTE prophylaxis, progressive ambulation, ADL's and discharge planning.   Dental prophylaxis discussed and recommended for 2 years postoperatively.  The patient does meet the criteria for TXA which will be used  perioperatively.   ASA 81 mg with home Plavix will be used postoperatively for DVT prophylaxis in addition to SCDs, and early ambulation. The patient is planning to be discharged home with OPPT in care of wife   Jola Baptist 09/05/2022 12:27 PM

## 2022-09-06 ENCOUNTER — Ambulatory Visit (HOSPITAL_COMMUNITY): Payer: Medicare Other | Admitting: Physician Assistant

## 2022-09-06 ENCOUNTER — Ambulatory Visit (HOSPITAL_COMMUNITY)
Admission: RE | Admit: 2022-09-06 | Discharge: 2022-09-06 | Disposition: A | Discharge status: Home / Self Care | Payer: Medicare Other | Source: Ambulatory Visit | Attending: Orthopedic Surgery | Admitting: Orthopedic Surgery

## 2022-09-06 ENCOUNTER — Other Ambulatory Visit: Payer: Self-pay

## 2022-09-06 ENCOUNTER — Encounter (HOSPITAL_COMMUNITY)
Admission: RE | Disposition: A | Discharge status: Home / Self Care | Payer: Self-pay | Source: Ambulatory Visit | Attending: Orthopedic Surgery

## 2022-09-06 ENCOUNTER — Encounter (HOSPITAL_COMMUNITY): Payer: Self-pay | Admitting: Orthopedic Surgery

## 2022-09-06 ENCOUNTER — Ambulatory Visit (HOSPITAL_COMMUNITY): Payer: Medicare Other

## 2022-09-06 ENCOUNTER — Ambulatory Visit (HOSPITAL_BASED_OUTPATIENT_CLINIC_OR_DEPARTMENT_OTHER): Payer: Medicare Other | Admitting: Anesthesiology

## 2022-09-06 DIAGNOSIS — I10 Essential (primary) hypertension: Secondary | ICD-10-CM | POA: Diagnosis not present

## 2022-09-06 DIAGNOSIS — I739 Peripheral vascular disease, unspecified: Secondary | ICD-10-CM | POA: Diagnosis not present

## 2022-09-06 DIAGNOSIS — K219 Gastro-esophageal reflux disease without esophagitis: Secondary | ICD-10-CM | POA: Diagnosis not present

## 2022-09-06 DIAGNOSIS — I2510 Atherosclerotic heart disease of native coronary artery without angina pectoris: Secondary | ICD-10-CM | POA: Insufficient documentation

## 2022-09-06 DIAGNOSIS — M1712 Unilateral primary osteoarthritis, left knee: Secondary | ICD-10-CM

## 2022-09-06 HISTORY — PX: PARTIAL KNEE ARTHROPLASTY: SHX2174

## 2022-09-06 SURGERY — ARTHROPLASTY, KNEE, UNICOMPARTMENTAL
Anesthesia: Spinal | Site: Knee | Laterality: Left

## 2022-09-06 MED ORDER — 0.9 % SODIUM CHLORIDE (POUR BTL) OPTIME
TOPICAL | Status: DC | PRN
  Administered 2022-09-06: 1000 mL

## 2022-09-06 MED ORDER — PHENYLEPHRINE HCL (PRESSORS) 10 MG/ML IV SOLN
INTRAVENOUS | Status: AC
  Filled 2022-09-06: qty 1

## 2022-09-06 MED ORDER — METHOCARBAMOL 500 MG IVPB - SIMPLE MED
500.0000 mg | Freq: Four times a day (QID) | INTRAVENOUS | Status: DC | PRN
  Administered 2022-09-06: 500 mg via INTRAVENOUS

## 2022-09-06 MED ORDER — ACETAMINOPHEN 500 MG PO TABS: 1000.0000 mg | ORAL_TABLET | Freq: Once | ORAL | Status: DC

## 2022-09-06 MED ORDER — ONDANSETRON HCL 4 MG/2ML IJ SOLN
INTRAMUSCULAR | Status: AC
  Filled 2022-09-06: qty 2

## 2022-09-06 MED ORDER — OXYCODONE HCL 5 MG PO TABS: 5.0000 mg | ORAL_TABLET | ORAL | Status: DC | PRN

## 2022-09-06 MED ORDER — LACTATED RINGERS IV BOLUS
250.0000 mL | Freq: Once | INTRAVENOUS | Status: AC
  Administered 2022-09-06: 250 mL via INTRAVENOUS

## 2022-09-06 MED ORDER — FENTANYL CITRATE PF 50 MCG/ML IJ SOSY
100.0000 ug | PREFILLED_SYRINGE | INTRAMUSCULAR | Status: DC
  Administered 2022-09-06: 50 ug via INTRAVENOUS
  Filled 2022-09-06: qty 2

## 2022-09-06 MED ORDER — PHENYLEPHRINE HCL-NACL 20-0.9 MG/250ML-% IV SOLN
INTRAVENOUS | Status: DC | PRN
  Administered 2022-09-06: 25 ug/min via INTRAVENOUS

## 2022-09-06 MED ORDER — OXYCODONE HCL 5 MG PO TABS: 5.0000 mg | ORAL_TABLET | Freq: Once | ORAL | Status: DC | PRN

## 2022-09-06 MED ORDER — OXYCODONE HCL 5 MG PO TABS: 5.0000 mg | ORAL_TABLET | ORAL | 0 refills | Status: DC | PRN

## 2022-09-06 MED ORDER — FENTANYL CITRATE PF 50 MCG/ML IJ SOSY: 25.0000 ug | PREFILLED_SYRINGE | INTRAMUSCULAR | Status: DC | PRN

## 2022-09-06 MED ORDER — ORAL CARE MOUTH RINSE: 15.0000 mL | Freq: Once | OROMUCOSAL | Status: AC

## 2022-09-06 MED ORDER — CLONIDINE HCL (ANALGESIA) 100 MCG/ML EP SOLN
EPIDURAL | Status: DC | PRN
  Administered 2022-09-06: 100 ug

## 2022-09-06 MED ORDER — TRANEXAMIC ACID-NACL 1000-0.7 MG/100ML-% IV SOLN: 1000.0000 mg | Freq: Once | INTRAVENOUS | Status: DC

## 2022-09-06 MED ORDER — BUPIVACAINE IN DEXTROSE 0.75-8.25 % IT SOLN
INTRATHECAL | Status: DC | PRN
  Administered 2022-09-06: 1.6 mL via INTRATHECAL

## 2022-09-06 MED ORDER — POVIDONE-IODINE 7.5 % EX SOLN: Freq: Once | CUTANEOUS | Status: DC

## 2022-09-06 MED ORDER — PROPOFOL 10 MG/ML IV BOLUS
INTRAVENOUS | Status: DC | PRN
  Administered 2022-09-06: 30 mg via INTRAVENOUS

## 2022-09-06 MED ORDER — LACTATED RINGERS IV SOLN: INTRAVENOUS | Status: DC

## 2022-09-06 MED ORDER — ONDANSETRON HCL 4 MG PO TABS: 4.0000 mg | ORAL_TABLET | Freq: Three times a day (TID) | ORAL | 0 refills | Status: DC | PRN

## 2022-09-06 MED ORDER — KETOROLAC TROMETHAMINE 30 MG/ML IJ SOLN
INTRAMUSCULAR | Status: DC | PRN
  Administered 2022-09-06: 30 mg

## 2022-09-06 MED ORDER — POVIDONE-IODINE 10 % EX SWAB: 2.0000 | Freq: Once | CUTANEOUS | Status: DC

## 2022-09-06 MED ORDER — CEFAZOLIN SODIUM-DEXTROSE 2-4 GM/100ML-% IV SOLN
INTRAVENOUS | Status: AC
  Filled 2022-09-06: qty 100

## 2022-09-06 MED ORDER — CHLORHEXIDINE GLUCONATE 0.12 % MT SOLN
15.0000 mL | Freq: Once | OROMUCOSAL | Status: AC
  Administered 2022-09-06: 15 mL via OROMUCOSAL

## 2022-09-06 MED ORDER — ACETAMINOPHEN 500 MG PO TABS
1000.0000 mg | ORAL_TABLET | Freq: Four times a day (QID) | ORAL | Status: DC
  Administered 2022-09-06: 1000 mg via ORAL

## 2022-09-06 MED ORDER — SENNA-DOCUSATE SODIUM 8.6-50 MG PO TABS: 2.0000 | ORAL_TABLET | Freq: Every day | ORAL | 1 refills | Status: DC

## 2022-09-06 MED ORDER — CEFAZOLIN SODIUM-DEXTROSE 2-4 GM/100ML-% IV SOLN
2.0000 g | INTRAVENOUS | Status: AC
  Administered 2022-09-06: 2 g via INTRAVENOUS
  Filled 2022-09-06: qty 100

## 2022-09-06 MED ORDER — DEXAMETHASONE SODIUM PHOSPHATE 10 MG/ML IJ SOLN
INTRAMUSCULAR | Status: AC
  Filled 2022-09-06: qty 1

## 2022-09-06 MED ORDER — BUPIVACAINE-EPINEPHRINE (PF) 0.5% -1:200000 IJ SOLN
INTRAMUSCULAR | Status: DC | PRN
  Administered 2022-09-06: 15 mL via PERINEURAL

## 2022-09-06 MED ORDER — AMISULPRIDE (ANTIEMETIC) 5 MG/2ML IV SOLN: 10.0000 mg | Freq: Once | INTRAVENOUS | Status: DC | PRN

## 2022-09-06 MED ORDER — ACETAMINOPHEN 500 MG PO TABS
1000.0000 mg | ORAL_TABLET | Freq: Once | ORAL | Status: AC
  Administered 2022-09-06: 1000 mg via ORAL
  Filled 2022-09-06: qty 2

## 2022-09-06 MED ORDER — CEFAZOLIN SODIUM-DEXTROSE 2-4 GM/100ML-% IV SOLN
2.0000 g | Freq: Four times a day (QID) | INTRAVENOUS | Status: DC
  Administered 2022-09-06: 2 g via INTRAVENOUS

## 2022-09-06 MED ORDER — ASPIRIN 81 MG PO TBEC: 81.0000 mg | DELAYED_RELEASE_TABLET | Freq: Every day | ORAL | 0 refills | Status: AC

## 2022-09-06 MED ORDER — OXYCODONE HCL 5 MG/5ML PO SOLN: 5.0000 mg | Freq: Once | ORAL | Status: DC | PRN

## 2022-09-06 MED ORDER — TRANEXAMIC ACID-NACL 1000-0.7 MG/100ML-% IV SOLN
1000.0000 mg | INTRAVENOUS | Status: AC
  Administered 2022-09-06: 1000 mg via INTRAVENOUS
  Filled 2022-09-06: qty 100

## 2022-09-06 MED ORDER — KETOROLAC TROMETHAMINE 30 MG/ML IJ SOLN
INTRAMUSCULAR | Status: AC
  Filled 2022-09-06: qty 1

## 2022-09-06 MED ORDER — LACTATED RINGERS IV BOLUS
500.0000 mL | Freq: Once | INTRAVENOUS | Status: AC
  Administered 2022-09-06: 500 mL via INTRAVENOUS

## 2022-09-06 MED ORDER — DEXAMETHASONE SODIUM PHOSPHATE 10 MG/ML IJ SOLN
INTRAMUSCULAR | Status: DC | PRN
  Administered 2022-09-06: 10 mg via INTRAVENOUS

## 2022-09-06 MED ORDER — BUPIVACAINE HCL 0.25 % IJ SOLN
INTRAMUSCULAR | Status: DC | PRN
  Administered 2022-09-06: 30 mL

## 2022-09-06 MED ORDER — MIDAZOLAM HCL 2 MG/2ML IJ SOLN
2.0000 mg | INTRAMUSCULAR | Status: DC
  Filled 2022-09-06: qty 2

## 2022-09-06 MED ORDER — SODIUM CHLORIDE 0.9 % IR SOLN
Status: DC | PRN
  Administered 2022-09-06: 1000 mL

## 2022-09-06 MED ORDER — ACETAMINOPHEN 500 MG PO TABS
ORAL_TABLET | ORAL | Status: AC
  Filled 2022-09-06: qty 2

## 2022-09-06 MED ORDER — METHOCARBAMOL 500 MG IVPB - SIMPLE MED
INTRAVENOUS | Status: AC
  Filled 2022-09-06: qty 55

## 2022-09-06 MED ORDER — BUPIVACAINE HCL (PF) 0.25 % IJ SOLN
INTRAMUSCULAR | Status: AC
  Filled 2022-09-06: qty 30

## 2022-09-06 MED ORDER — PROPOFOL 500 MG/50ML IV EMUL
INTRAVENOUS | Status: DC | PRN
  Administered 2022-09-06: 100 ug/kg/min via INTRAVENOUS

## 2022-09-06 MED ORDER — METHOCARBAMOL 500 MG PO TABS: 500.0000 mg | ORAL_TABLET | Freq: Four times a day (QID) | ORAL | Status: DC | PRN

## 2022-09-06 SURGICAL SUPPLY — 65 items
BAG COUNTER SPONGE SURGICOUNT (BAG) IMPLANT
BAG SPEC THK2 15X12 ZIP CLS (MISCELLANEOUS) ×1
BAG ZIPLOCK 12X15 (MISCELLANEOUS) ×1 IMPLANT
BANDAGE ESMARK 6X9 LF (GAUZE/BANDAGES/DRESSINGS) ×1 IMPLANT
BEARING TIBIAL STRL SZ3 LG (Knees) IMPLANT
BLADE SURG 15 STRL LF DISP TIS (BLADE) ×1 IMPLANT
BLADE SURG 15 STRL SS (BLADE) ×1
BNDG ELASTIC 6X10 VLCR STRL LF (GAUZE/BANDAGES/DRESSINGS) IMPLANT
BNDG ELASTIC 6X15 VLCR STRL LF (GAUZE/BANDAGES/DRESSINGS) ×1 IMPLANT
BNDG ESMARK 6X9 LF (GAUZE/BANDAGES/DRESSINGS) ×1
BOWL SMART MIX CTS (DISPOSABLE) ×1 IMPLANT
CEMENT BONE R 1X40 (Cement) IMPLANT
CLSR STERI-STRIP ANTIMIC 1/2X4 (GAUZE/BANDAGES/DRESSINGS) ×1 IMPLANT
COMPONENT TIBIA MEDL OXFRD LFT (Joint) IMPLANT
COVER SURGICAL LIGHT HANDLE (MISCELLANEOUS) ×1 IMPLANT
CUFF TOURN SGL QUICK 34 (TOURNIQUET CUFF) ×1
CUFF TRNQT CYL 34X4.125X (TOURNIQUET CUFF) ×1 IMPLANT
DRAPE EXTREMITY T 121X128X90 (DISPOSABLE) ×1 IMPLANT
DRAPE POUCH INSTRU U-SHP 10X18 (DRAPES) ×1 IMPLANT
DRAPE SHEET LG 3/4 BI-LAMINATE (DRAPES) ×1 IMPLANT
DRAPE U-SHAPE 47X51 STRL (DRAPES) ×1 IMPLANT
DRSG MEPILEX POST OP 4X8 (GAUZE/BANDAGES/DRESSINGS) ×1 IMPLANT
DURAPREP 26ML APPLICATOR (WOUND CARE) ×2 IMPLANT
ELECT REM PT RETURN 15FT ADLT (MISCELLANEOUS) ×1 IMPLANT
FACESHIELD WRAPAROUND (MASK) ×1 IMPLANT
FACESHIELD WRAPAROUND OR TEAM (MASK) ×1 IMPLANT
GAUZE PAD ABD 8X10 STRL (GAUZE/BANDAGES/DRESSINGS) ×1 IMPLANT
GLOVE BIO SURGEON STRL SZ 6.5 (GLOVE) ×1 IMPLANT
GLOVE BIOGEL PI IND STRL 7.0 (GLOVE) ×1 IMPLANT
GLOVE BIOGEL PI IND STRL 8 (GLOVE) ×1 IMPLANT
GOWN STRL REUS W/ TWL LRG LVL3 (GOWN DISPOSABLE) ×2 IMPLANT
GOWN STRL REUS W/TWL LRG LVL3 (GOWN DISPOSABLE) ×2
HANDPIECE INTERPULSE COAX TIP (DISPOSABLE) ×1
HOLDER FOLEY CATH W/STRAP (MISCELLANEOUS) IMPLANT
HOOD PEEL AWAY T7 (MISCELLANEOUS) ×2 IMPLANT
IMMOBILIZER KNEE 20 (SOFTGOODS) ×1
IMMOBILIZER KNEE 20 THIGH 36 (SOFTGOODS) IMPLANT
IMMOBILIZER KNEE 22 UNIV (SOFTGOODS) IMPLANT
KIT BASIN OR (CUSTOM PROCEDURE TRAY) ×1 IMPLANT
KIT TURNOVER KIT A (KITS) IMPLANT
NDL SAFETY ECLIP 18X1.5 (MISCELLANEOUS) ×1 IMPLANT
NS IRRIG 1000ML POUR BTL (IV SOLUTION) ×1 IMPLANT
PACK BLADE SAW RECIP 70 3 PT (BLADE) IMPLANT
PACK TOTAL JOINT (CUSTOM PROCEDURE TRAY) ×1 IMPLANT
PEG FEMORAL CEMENT STRL LRG (Knees) IMPLANT
PROTECTOR NERVE ULNAR (MISCELLANEOUS) ×1 IMPLANT
SET HNDPC FAN SPRY TIP SCT (DISPOSABLE) ×1 IMPLANT
SPIKE FLUID TRANSFER (MISCELLANEOUS) IMPLANT
SUCTION FRAZIER HANDLE 12FR (TUBING) ×1
SUCTION TUBE FRAZIER 12FR DISP (TUBING) ×1 IMPLANT
SUT VIC AB 1 CT1 36 (SUTURE) ×1 IMPLANT
SUT VIC AB 2-0 CT1 27 (SUTURE) ×2
SUT VIC AB 2-0 CT1 TAPERPNT 27 (SUTURE) ×1 IMPLANT
SUT VIC AB 3-0 SH 27 (SUTURE) ×2
SUT VIC AB 3-0 SH 27X BRD (SUTURE) ×2 IMPLANT
SYR 27GX1/2 1ML LL SAFETY (SYRINGE) IMPLANT
SYR 30ML LL (SYRINGE) ×1 IMPLANT
SYR 3ML LL SCALE MARK (SYRINGE) ×1 IMPLANT
TIBIA MEDIAL OXFORD LEFT (Joint) ×1 IMPLANT
TOWEL OR 17X26 10 PK STRL BLUE (TOWEL DISPOSABLE) ×1 IMPLANT
TOWEL OR NON WOVEN STRL DISP B (DISPOSABLE) ×1 IMPLANT
TRAY FOLEY MTR SLVR 16FR STAT (SET/KITS/TRAYS/PACK) ×1 IMPLANT
TUBE SUCTION HIGH CAP CLEAR NV (SUCTIONS) ×1 IMPLANT
WATER STERILE IRR 1000ML POUR (IV SOLUTION) ×1 IMPLANT
WRAP KNEE MAXI GEL POST OP (GAUZE/BANDAGES/DRESSINGS) ×1 IMPLANT

## 2022-09-06 NOTE — Anesthesia Procedure Notes (Signed)
Anesthesia Regional Block: Adductor canal block   Pre-Anesthetic Checklist: , timeout performed,  Correct Patient, Correct Site, Correct Laterality,  Correct Procedure, Correct Position, site marked,  Risks and benefits discussed,  Pre-op evaluation,  At surgeon's request and post-op pain management  Laterality: Left  Prep: Maximum Sterile Barrier Precautions used, chloraprep       Needles:  Injection technique: Single-shot  Needle Type: Echogenic Stimulator Needle     Needle Length: 9cm  Needle Gauge: 22     Additional Needles:   Procedures:,,,, ultrasound used (permanent image in chart),,    Narrative:  Start time: 09/06/2022 9:21 AM End time: 09/06/2022 9:24 AM Injection made incrementally with aspirations every 5 mL.  Performed by: Personally  Anesthesiologist: Brennan Bailey, MD  Additional Notes: Risks, benefits, and alternative discussed. Patient gave consent for procedure. Patient prepped and draped in sterile fashion. Sedation administered, patient remains easily responsive to voice. Relevant anatomy identified with ultrasound guidance. Local anesthetic given in 5cc increments with no signs or symptoms of intravascular injection. No pain or paraesthesias with injection. Patient monitored throughout procedure with signs of LAST or immediate complications. Tolerated well. Ultrasound image placed in chart.  Tawny Asal, MD

## 2022-09-06 NOTE — Op Note (Signed)
09/06/2022  12:25 PM  PATIENT:  Clayton Flaming, Clayton Powell    PRE-OPERATIVE DIAGNOSIS: Left anteromedial knee osteoarthritis  POST-OPERATIVE DIAGNOSIS:  Same  PROCEDURE:  Unicompartmental Knee Arthroplasty  SURGEON:  Johnny Bridge, MD  PHYSICIAN ASSISTANT: Merlene Pulling, PA-C, present and scrubbed throughout the case, critical for completion in a timely fashion, and for retraction, instrumentation, and closure.  ANESTHESIA:   Spinal with adductor canal block  ESTIMATED BLOOD LOSS: 100 mL  UNIQUE ASPECTS OF THE CASE: He had advanced eburnation medially.  There is a small area of some grade 2 changes in the femoral trochlea although the patella was well-preserved.  The lateral side was intact and the ACL was intact.  He was between a size medium and a size large, but I went with a large on the femur because it reconstituted the chondral thickness better.  He was slightly tighter in extension then in flexion, and the 3 felt appropriate.  PREOPERATIVE INDICATIONS:  Clayton Flaming, Clayton Powell is a  77 y.o. male with a diagnosis of DJD LEFT KNEE who failed conservative measures and elected for surgical management.    The risks benefits and alternatives were discussed with the patient preoperatively including but not limited to the risks of infection, bleeding, nerve injury, cardiopulmonary complications, blood clots, the need for revision surgery, among others, and the patient was willing to proceed.  OPERATIVE IMPLANTS: Biomet Oxford mobile bearing medial compartment arthroplasty femur size large, tibia size C, bearing size 3.  OPERATIVE PROCEDURE: The patient was brought to the operating room placed in the supine position. Anesthesia was administered. IV antibiotics were given. The lower extremity was placed in the legholder and prepped and draped in usual sterile fashion.  Time out was performed.  The leg was elevated and exsanguinated and the tourniquet was inflated. Anteromedial incision was  performed, and I took care to preserve the MCL. Parapatellar incision was carried out, and the osteophytes were excised, along with the medial meniscus and a small portion of the fat pad.  The extra medullary tibial cutting jig was applied, using the spoon and the 78m G-Clamp and the 2 mm shim, and I took care to protect the anterior cruciate ligament insertion and the tibial spine. The medial collateral ligament was also protected, and I resected my proximal tibia, matching the anatomic slope.   The proximal tibial bony cut was removed in one piece, and I turned my attention to the femur.  The intramedullary femoral rod was placed using the drill, getting the rod down the canal had medium resistance, although I would did it by hand.  Then using the appropriate reference, I assembled the femoral jig, setting my posterior cutting block. I resected my posterior femur, used the 0 spigot for the anterior femur, and then measured my gap.   I then used the appropriate mill to match the extension gap to the flexion gap. The second milling was at a 4.  The gaps were then measured again with the appropriate feeler gauges. Once I had balanced flexion and extension gaps, I then completed the preparation of the femur.  I milled off the anterior aspect of the distal femur to prevent impingement. I also exposed the tibia, and selected the above-named component, and then used the cutting jig to prepare the keel slot on the tibia. I also used the awl to curette out the bone to complete the preparation of the keel. The back wall was intact.  I then placed trial components, and it  was found to have excellent motion, and appropriate balance.  I then cemented the components into place, cementing the tibia first, removing all excess cement, and then cementing the femur.  All loose cement was removed.  The real polyethylene insert was applied manually, and the knee was taken through functional range of motion, and found  to have excellent stability and restoration of joint motion, with excellent balance.  The wounds were irrigated copiously, and the parapatellar tissue closed with Vicryl, followed by Vicryl for the subcutaneous tissue, with routine closure with Steri-Strips and sterile gauze.  The tourniquet was released, and the patient was awakened and extubated and returned to PACU in stable and satisfactory condition. There were no complications.

## 2022-09-06 NOTE — Discharge Instructions (Signed)
INSTRUCTIONS AFTER JOINT REPLACEMENT   Remove items at home which could result in a fall. This includes throw rugs or furniture in walking pathways ICE to the affected joint every three hours while awake for 30 minutes at a time, for at least the first 3-5 days, and then as needed for pain and swelling.  Continue to use ice for pain and swelling. You may notice swelling that will progress down to the foot and ankle.  This is normal after surgery.  Elevate your leg when you are not up walking on it.   Continue to use the breathing machine you got in the hospital (incentive spirometer) which will help keep your temperature down.  It is common for your temperature to cycle up and down following surgery, especially at night when you are not up moving around and exerting yourself.  The breathing machine keeps your lungs expanded and your temperature down.   DIET:  As you were doing prior to hospitalization, we recommend a well-balanced diet.  DRESSING / WOUND CARE / SHOWERING  You may shower 3 days after surgery, but keep the wounds dry during showering.  You may use an occlusive plastic wrap (Press'n Seal for example), NO SOAKING/SUBMERGING IN THE BATHTUB.  If the bandage gets wet, change with a clean dry gauze.  If the incision gets wet, pat the wound dry with a clean towel. Ok to remove ace wrap on Wednesday. Replace with Thigh high ted hose. Wear ted hose during the day, ok to remove when sleeping at night.   ACTIVITY  Increase activity slowly as tolerated, but follow the weight bearing instructions below.   No driving for 6 weeks or until further direction given by your physician.  You cannot drive while taking narcotics.  No lifting or carrying greater than 10 lbs. until further directed by your surgeon. Avoid periods of inactivity such as sitting longer than an hour when not asleep. This helps prevent blood clots.  You may return to work once you are authorized by your doctor.     WEIGHT  BEARING   Weight bearing as tolerated with assist device (walker, cane, etc) as directed, use it as long as suggested by your surgeon or therapist, typically at least 4-6 weeks.   EXERCISES  Results after joint replacement surgery are often greatly improved when you follow the exercise, range of motion and muscle strengthening exercises prescribed by your doctor. Safety measures are also important to protect the joint from further injury. Any time any of these exercises cause you to have increased pain or swelling, decrease what you are doing until you are comfortable again and then slowly increase them. If you have problems or questions, call your caregiver or physical therapist for advice.   Rehabilitation is important following a joint replacement. After just a few days of immobilization, the muscles of the leg can become weakened and shrink (atrophy).  These exercises are designed to build up the tone and strength of the thigh and leg muscles and to improve motion. Often times heat used for twenty to thirty minutes before working out will loosen up your tissues and help with improving the range of motion but do not use heat for the first two weeks following surgery (sometimes heat can increase post-operative swelling).   These exercises can be done on a training (exercise) mat, on the floor, on a table or on a bed. Use whatever works the best and is most comfortable for you.    Use music  or television while you are exercising so that the exercises are a pleasant break in your day. This will make your life better with the exercises acting as a break in your routine that you can look forward to.   Perform all exercises about fifteen times, three times per day or as directed.  You should exercise both the operative leg and the other leg as well.  Exercises include:   Quad Sets - Tighten up the muscle on the front of the thigh (Quad) and hold for 5-10 seconds.   Straight Leg Raises - With your knee  straight (if you were given a brace, keep it on), lift the leg to 60 degrees, hold for 3 seconds, and slowly lower the leg.  Perform this exercise against resistance later as your leg gets stronger.  Leg Slides: Lying on your back, slowly slide your foot toward your buttocks, bending your knee up off the floor (only go as far as is comfortable). Then slowly slide your foot back down until your leg is flat on the floor again.  Angel Wings: Lying on your back spread your legs to the side as far apart as you can without causing discomfort.  Hamstring Strength:  Lying on your back, push your heel against the floor with your leg straight by tightening up the muscles of your buttocks.  Repeat, but this time bend your knee to a comfortable angle, and push your heel against the floor.  You may put a pillow under the heel to make it more comfortable if necessary.   A rehabilitation program following joint replacement surgery can speed recovery and prevent re-injury in the future due to weakened muscles. Contact your doctor or a physical therapist for more information on knee rehabilitation.    CONSTIPATION  Constipation is defined medically as fewer than three stools per week and severe constipation as less than one stool per week.  Even if you have a regular bowel pattern at home, your normal regimen is likely to be disrupted due to multiple reasons following surgery.  Combination of anesthesia, postoperative narcotics, change in appetite and fluid intake all can affect your bowels.   YOU MUST use at least one of the following options; they are listed in order of increasing strength to get the job done.  They are all available over the counter, and you may need to use some, POSSIBLY even all of these options:    Drink plenty of fluids (prune juice may be helpful) and high fiber foods Colace 100 mg by mouth twice a day  Senokot for constipation as directed and as needed Dulcolax (bisacodyl), take with full  glass of water  Miralax (polyethylene glycol) once or twice a day as needed.  If you have tried all these things and are unable to have a bowel movement in the first 3-4 days after surgery call either your surgeon or your primary doctor.    If you experience loose stools or diarrhea, hold the medications until you stool forms back up.  If your symptoms do not get better within 1 week or if they get worse, check with your doctor.  If you experience "the worst abdominal pain ever" or develop nausea or vomiting, please contact the office immediately for further recommendations for treatment.   ITCHING:  If you experience itching with your medications, try taking only a single pain pill, or even half a pain pill at a time.  You can also use Benadryl over the counter for itching  or also to help with sleep.   TED HOSE STOCKINGS:  Use stockings on both legs until for at least 2 weeks or as directed by physician office. They may be removed at night for sleeping.  MEDICATIONS:  See your medication summary on the "After Visit Summary" that nursing will review with you.  You may have some home medications which will be placed on hold until you complete the course of blood thinner medication.  It is important for you to complete the blood thinner medication as prescribed.  PRECAUTIONS:  If you experience chest pain or shortness of breath - call 911 immediately for transfer to the hospital emergency department.   If you develop a fever greater that 101 F, purulent drainage from wound, increased redness or drainage from wound, foul odor from the wound/dressing, or calf pain - CONTACT YOUR SURGEON.                                                   FOLLOW-UP APPOINTMENTS:  If you do not already have a post-op appointment, please call the office for an appointment to be seen by your surgeon.  Guidelines for how soon to be seen are listed in your "After Visit Summary", but are typically between 1-4 weeks after  surgery.  OTHER INSTRUCTIONS:   Knee Replacement:  Do not place pillow under knee, focus on keeping the knee straight while resting.   POST-OPERATIVE OPIOID TAPER INSTRUCTIONS: It is important to wean off of your opioid medication as soon as possible. If you do not need pain medication after your surgery it is ok to stop day one. Opioids include: Codeine, Hydrocodone(Norco, Vicodin), Oxycodone(Percocet, oxycontin) and hydromorphone amongst others.  Long term and even short term use of opiods can cause: Increased pain response Dependence Constipation Depression Respiratory depression And more.  Withdrawal symptoms can include Flu like symptoms Nausea, vomiting And more Techniques to manage these symptoms Hydrate well Eat regular healthy meals Stay active Use relaxation techniques(deep breathing, meditating, yoga) Do Not substitute Alcohol to help with tapering If you have been on opioids for less than two weeks and do not have pain than it is ok to stop all together.  Plan to wean off of opioids This plan should start within one week post op of your joint replacement. Maintain the same interval or time between taking each dose and first decrease the dose.  Cut the total daily intake of opioids by one tablet each day Next start to increase the time between doses. The last dose that should be eliminated is the evening dose.   MAKE SURE YOU:  Understand these instructions.  Get help right away if you are not doing well or get worse.    Thank you for letting us be a part of your medical care team.  It is a privilege we respect greatly.  We hope these instructions will help you stay on track for a fast and full recovery!

## 2022-09-06 NOTE — OR Nursing (Signed)
PT at bedside w/patient in Phase II.

## 2022-09-06 NOTE — Evaluation (Signed)
Physical Therapy Evaluation Patient Details Name: Clayton Powell MRN: [id number] DOB: [birth year] Today's Date: 09/06/2022  History of Present Illness  77 yo male presents to therapy s/p L unicompartmental knee replacement on 09/06/2022. Pt has pmh including but not limited to: angina, BPH, CAD s/p CABG x 3), GERD, HDL, HTN, L RTC tear with repair, and R RTC repair and subacromial decompression.  Clinical Impression  Clayton Powell is a 77 y.o. male POD 0 s/p L partial knee replacement. Patient reports IND with mobility at baseline. Patient is now limited by functional impairments (see PT problem list below) and requires min guard and cues  for transfers and gait with RW. Patient was able to ambulate 40 x 2 and 15 feet with RW and min guard  and cues for safe walker management. Patient educated on safe sequencing for stair mobility and verbalized safe guarding position for people assisting with mobility. Patient instructed in exercises to facilitate ROM and circulation. Patient will benefit from continued skilled PT interventions to address impairments and progress towards PLOF. Patient has met mobility goals at adequate level for discharge home; will continue to follow if pt continues acute stay to progress towards Mod I goals.       Recommendations for follow up therapy are one component of a multi-disciplinary discharge planning process, led by the attending physician.  Recommendations may be updated based on patient status, additional functional criteria and insurance authorization.  Follow Up Recommendations Follow physician's recommendations for discharge plan and follow up therapies      Assistance Recommended at Discharge Frequent or constant Supervision/Assistance  Patient can return home with the following  A little help with walking and/or transfers;A little help with bathing/dressing/bathroom;Assistance with cooking/housework;Assist for transportation;Help with stairs or  ramp for entrance    Equipment Recommendations Other (comment) (pt reports having RW at home)  Recommendations for Other Services       Functional Status Assessment Patient has had a recent decline in their functional status and demonstrates the ability to make significant improvements in function in a reasonable and predictable amount of time.     Precautions / Restrictions Precautions Precautions: Fall Precaution Comments: no pillow under L LE Restrictions Weight Bearing Restrictions: No Other Position/Activity Restrictions: L LE WBAT      Mobility  Bed Mobility Overal bed mobility: Needs Assistance Bed Mobility: Supine to Sit     Supine to sit: Min guard     General bed mobility comments: cues and increased time    Transfers Overall transfer level: Needs assistance Equipment used: Rolling walker (2 wheels) Transfers: Sit to/from Stand Sit to Stand: Min guard           General transfer comment: cues for proper UE and LLE placement with transfers bed, commode and recliner    Ambulation/Gait Ambulation/Gait assistance: Min guard Gait Distance (Feet): 40 Feet Assistive device: Rolling walker (2 wheels) Gait Pattern/deviations: Step-to pattern Gait velocity: decreased     General Gait Details: use of B UEs to offload L LE in stance phase  Stairs            Wheelchair Mobility    Modified Rankin (Stroke Patients Only)       Balance Overall balance assessment: Needs assistance Sitting-balance support: Feet supported Sitting balance-Leahy Scale: Fair     Standing balance support: Reliant on assistive device for balance Standing balance-Leahy Scale: Poor  Pertinent Vitals/Pain Pain Assessment Pain Assessment: No/denies pain Pain Intervention(s): Ice applied, Monitored during session, Other (comment) (pt reports no pain t/o PT eval)    Home Living Family/patient expects to be discharged to:: Private  residence Living Arrangements: Spouse/significant other Available Help at Discharge: Family Type of Home: House Home Access: Stairs to enter Entrance Stairs-Rails: Right (entering from garage) Technical brewer of Steps: 4   Home Layout: Multi-level Home Equipment: Conservation officer, nature (2 wheels)      Prior Function Prior Level of Function : Independent/Modified Independent             Mobility Comments: I wth ADLs, self care, IADLs and driving no AD for amb       Hand Dominance        Extremity/Trunk Assessment        Lower Extremity Assessment Lower Extremity Assessment: LLE deficits/detail LLE Deficits / Details: L ankle DF/PF 5/5, hip flexion 4+/5 LLE Sensation: WNL       Communication   Communication: No difficulties  Cognition Arousal/Alertness: Awake/alert Behavior During Therapy: WFL for tasks assessed/performed Overall Cognitive Status: Within Functional Limits for tasks assessed                                          General Comments      Exercises Total Joint Exercises Ankle Circles/Pumps: AROM, Both, 20 reps Quad Sets: AROM, Left, 5 reps Short Arc Quad: AROM, Left, 5 reps Heel Slides: AROM, Left, 5 reps Hip ABduction/ADduction: AROM, Left, 5 reps Straight Leg Raises: AROM, Left, 5 reps   Assessment/Plan    PT Assessment Patient needs continued PT services  PT Problem List Decreased strength;Decreased range of motion;Decreased activity tolerance;Decreased balance;Decreased mobility;Decreased knowledge of use of DME       PT Treatment Interventions DME instruction;Gait training;Stair training;Functional mobility training;Therapeutic activities;Therapeutic exercise;Balance training;Neuromuscular re-education;Modalities;Patient/family education    PT Goals (Current goals can be found in the Care Plan section)  Acute Rehab PT Goals Patient Stated Goal: be able to climb the 2 flights of stairs to pt office PT Goal  Formulation: With patient Time For Goal Achievement: 09/13/22 Potential to Achieve Goals: Good    Frequency       Co-evaluation               AM-PAC PT "6 Clicks" Mobility  Outcome Measure Help needed turning from your back to your side while in a flat bed without using bedrails?: A Little Help needed moving from lying on your back to sitting on the side of a flat bed without using bedrails?: A Little Help needed moving to and from a bed to a chair (including a wheelchair)?: A Little Help needed standing up from a chair using your arms (e.g., wheelchair or bedside chair)?: A Little Help needed to walk in hospital room?: A Little Help needed climbing 3-5 steps with a railing? : A Little 6 Click Score: 18    End of Session Equipment Utilized During Treatment: Gait belt Activity Tolerance: No increased pain Patient left: in chair;with family/visitor present;with nursing/sitter in room Nurse Communication: Mobility status;Other (comment) (pt unable to void bladder during therapy session) PT Visit Diagnosis: Unsteadiness on feet (R26.81);Other abnormalities of gait and mobility (R26.89);Muscle weakness (generalized) (M62.81);Difficulty in walking, not elsewhere classified (R26.2)    Time: [id number] PT Time Calculation (min) (ACUTE ONLY): 53 min   Charges:   PT Evaluation $  PT Eval Low Complexity: 1 Low PT Treatments $Gait Training: 8-22 mins $Therapeutic Exercise: 8-22 mins $Therapeutic Activity: 8-22 mins       Baird Lyons, PT   Adair Patter 09/06/2022, 5:36 PM

## 2022-09-06 NOTE — Interval H&P Note (Signed)
History and Physical Interval Note:  09/06/2022 10:00 AM  Clayton Flaming, PhD  has presented today for surgery, with the diagnosis of DJD LEFT KNEE.  The various methods of treatment have been discussed with the patient and family. After consideration of risks, benefits and other options for treatment, the patient has consented to  Procedure(s): UNICOMPARTMENTAL KNEE (Left) as a surgical intervention.  The patient's history has been reviewed, patient examined, no change in status, stable for surgery.  I have reviewed the patient's chart and labs.  Questions were answered to the patient's satisfaction.     Johnny Bridge

## 2022-09-06 NOTE — OR Nursing (Signed)
X-Ray at bedside.

## 2022-09-06 NOTE — Transfer of Care (Signed)
Immediate Anesthesia Transfer of Care Note  Patient: Clayton Flaming, PhD  Procedure(s) Performed: UNICOMPARTMENTAL KNEE (Left: Knee)  Patient Location: PACU  Anesthesia Type:Spinal and MAC combined with regional for post-op pain  Level of Consciousness: sedated and responds to stimulation  Airway & Oxygen Therapy: Patient Spontanous Breathing and Patient connected to face mask oxygen  Post-op Assessment: Report given to RN and Post -op Vital signs reviewed and stable  Post vital signs: stable  Last Vitals:  Vitals Value Taken Time  BP 97/55 09/06/22 1300  Temp    Pulse 54 09/06/22 1302  Resp 15 09/06/22 1302  SpO2 92 % 09/06/22 1302  Vitals shown include unvalidated device data.  Last Pain:  Vitals:   09/06/22 0922  TempSrc: Oral  PainSc: 0-No pain         Complications: No notable events documented.

## 2022-09-06 NOTE — Anesthesia Procedure Notes (Signed)
Spinal  Patient location during procedure: OR Start time: 09/06/2022 10:57 AM End time: 09/06/2022 11:00 AM Reason for block: surgical anesthesia Staffing Performed: anesthesiologist  Anesthesiologist: Brennan Bailey, MD Performed by: Brennan Bailey, MD Authorized by: Brennan Bailey, MD   Preanesthetic Checklist Completed: patient identified, IV checked, risks and benefits discussed, surgical consent, monitors and equipment checked, pre-op evaluation and timeout performed Spinal Block Patient position: sitting Prep: DuraPrep and site prepped and draped Patient monitoring: continuous pulse ox, blood pressure and heart rate Approach: right paramedian Location: L3-4 Injection technique: single-shot Needle Needle type: Pencan  Needle gauge: 24 G Needle length: 9 cm Assessment Events: CSF return Additional Notes Risks, benefits, and alternative discussed. Patient gave consent to procedure. Prepped and draped in sitting position. Patient sedated but responsive to voice. Clear CSF obtained after one needle pass. Positive terminal aspiration. No pain or paraesthesias with injection. Patient tolerated procedure well. Vital signs stable. Tawny Asal, MD

## 2022-09-06 NOTE — Anesthesia Postprocedure Evaluation (Signed)
Anesthesia Post Note  Patient: Clayton Flaming, PhD  Procedure(s) Performed: UNICOMPARTMENTAL KNEE (Left: Knee)     Patient location during evaluation: PACU Anesthesia Type: Spinal Level of consciousness: awake and alert Pain management: pain level controlled Vital Signs Assessment: post-procedure vital signs reviewed and stable Respiratory status: spontaneous breathing, nonlabored ventilation and respiratory function stable Cardiovascular status: blood pressure returned to baseline Postop Assessment: no apparent nausea or vomiting, spinal receding, no headache and no backache Anesthetic complications: no   No notable events documented.  Last Vitals:  Vitals:   09/06/22 1345 09/06/22 1400  BP: 120/73 120/68  Pulse: (!) 57 (!) 55  Resp: 16 16  Temp:    SpO2: 92% 91%    Last Pain:  Vitals:   09/06/22 1400  TempSrc:   PainSc: 0-No pain                 Marthenia Rolling

## 2022-09-08 ENCOUNTER — Encounter (HOSPITAL_COMMUNITY): Payer: Self-pay | Admitting: Orthopedic Surgery

## 2022-09-20 NOTE — Telephone Encounter (Signed)
Device clearance requested.  Pt follows with Dr. Nehemiah Massed.  Fax returned to sender to advise.  No clearance provided.

## 2022-11-07 ENCOUNTER — Telehealth: Payer: Self-pay | Admitting: Family Medicine

## 2022-11-07 DIAGNOSIS — E782 Mixed hyperlipidemia: Secondary | ICD-10-CM

## 2022-11-07 NOTE — Telephone Encounter (Signed)
Referral placed. Please notify pt

## 2022-11-07 NOTE — Telephone Encounter (Signed)
Patient called in stating that his cardiologist left and moved to another town,and he would like to see Dr Rennis Golden now at [facility]. He would like to I know if a referral can be sent so that he can see him?

## 2022-11-08 ENCOUNTER — Telehealth: Payer: Self-pay | Admitting: Internal Medicine

## 2022-11-08 DIAGNOSIS — E782 Mixed hyperlipidemia: Secondary | ICD-10-CM

## 2022-11-08 NOTE — Telephone Encounter (Signed)
NMR lipoprofile ordered - mailed to patient

## 2022-11-08 NOTE — Telephone Encounter (Signed)
Patient would like to have labs done prior to his visit on 04/07/23. He would like for the orders to be sent to him.

## 2022-11-08 NOTE — Telephone Encounter (Signed)
Patient notified referral was placed.

## 2022-11-08 NOTE — Telephone Encounter (Signed)
Chrystie Nose, MD  Lindell Spar, RN Caller: Unspecified (Today, 11:09 AM) An NMR would be fine.  Thanks.

## 2022-11-08 NOTE — Telephone Encounter (Signed)
Please advise which labs to be ordered prior to patient September appt.  LVM for patient that we would ask for labs and call when ordered.

## 2022-12-12 ENCOUNTER — Ambulatory Visit (INDEPENDENT_AMBULATORY_CARE_PROVIDER_SITE_OTHER): Payer: Medicare Other | Admitting: Nurse Practitioner

## 2022-12-12 ENCOUNTER — Encounter (INDEPENDENT_AMBULATORY_CARE_PROVIDER_SITE_OTHER): Payer: Self-pay | Admitting: Nurse Practitioner

## 2022-12-12 ENCOUNTER — Ambulatory Visit (INDEPENDENT_AMBULATORY_CARE_PROVIDER_SITE_OTHER): Payer: Medicare Other

## 2022-12-12 VITALS — BP 144/82 | HR 80 | Resp 16 | Wt 206.0 lb

## 2022-12-12 DIAGNOSIS — I739 Peripheral vascular disease, unspecified: Secondary | ICD-10-CM | POA: Diagnosis not present

## 2022-12-12 DIAGNOSIS — I10 Essential (primary) hypertension: Secondary | ICD-10-CM

## 2022-12-12 DIAGNOSIS — E782 Mixed hyperlipidemia: Secondary | ICD-10-CM

## 2022-12-12 DIAGNOSIS — I70211 Atherosclerosis of native arteries of extremities with intermittent claudication, right leg: Secondary | ICD-10-CM | POA: Diagnosis not present

## 2022-12-12 DIAGNOSIS — Z9889 Other specified postprocedural states: Secondary | ICD-10-CM | POA: Diagnosis not present

## 2022-12-12 NOTE — Progress Notes (Signed)
Subjective:    Patient ID: Clayton Moris, Clayton Powell, male    DOB: May 29, 1946, 77 y.o.   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   There have been no interval changes in lower extremity symptoms. No interval shortening of the patient's claudication distance or development of rest pain symptoms. No new ulcers or wounds have occurred since the last visit.  He has previously had intervention on his right lower extremity with the most recent being on 08/23/2021.  Since his last office visit the patient had a partial knee replacement.  He is working extensively with physical therapy and has noted no significant issues.  His recovery is going well.  There is some swelling in the left leg but not so significant that the patient is not able to tolerate.  The patient denies amaurosis fugax or recent TIA symptoms. There are no documented recent neurological changes noted. There is no history of DVT, PE or superficial thrombophlebitis. The patient denies recent episodes of angina or shortness of breath.   ABI Rt=1.10 and Lt=1.15  (previous ABI's Rt=1.21 and Lt=1.28) Duplex ultrasound of the bilateral tibial arteries reveals triphasic waveforms with normal toe waveforms bilaterally    Review of Systems  Cardiovascular:  Positive for leg swelling.  All other systems reviewed and are negative.      Objective:   Physical Exam Vitals reviewed.  HENT:     Head: Normocephalic.  Cardiovascular:     Rate and Rhythm: Normal rate.     Pulses:          Dorsalis pedis pulses are detected w/ Doppler on the right side and detected w/ Doppler on the left side.       Posterior tibial pulses are detected w/ Doppler on the right side and detected w/ Doppler on the left side.  Pulmonary:     Effort: Pulmonary effort is normal.   Musculoskeletal:     Left lower leg: Edema present.  Skin:    General: Skin is warm and dry.  Neurological:     Mental Status: He is alert and oriented to person, place, and time.  Psychiatric:        Mood and Affect: Mood normal.        Behavior: Behavior normal.        Thought Content: Thought content normal.        Judgment: Judgment normal.     BP (!) 144/82 (BP Location: Left Arm)   Pulse 80   Resp 16   Wt 206 lb (93.4 kg)   BMI 32.26 kg/m   Past Medical History:  Diagnosis Date   Allergy    Anginal pain (HCC)    Arthritis    BPH (benign prostatic hypertrophy) 12/2005   CAD (coronary artery disease) 2010   dx made by coronary calcium scoring, Normal stress test in 2010.    GERD (gastroesophageal reflux disease) 06/2004   HLD (hyperlipidemia) 09/1997   HTN (hypertension) 2002   no meds   Left rotator cuff tear 08/12/2011   Per Dr. Dion Saucier 2013 L rotator cuff tear on MRI 2013    Right rotator cuff tear 05/24/2013   Wears glasses     Social History   Socioeconomic History   Marital status: Married    Spouse name: Junious Dresser  Number of children: 2   Years of education: Not on file   Highest education level: Not on file  Occupational History   Not on file  Tobacco Use   Smoking status: Never   Smokeless tobacco: Never  Vaping Use   Vaping Use: Never used  Substance and Sexual Activity   Alcohol use: Yes    Alcohol/week: 0.0 standard drinks of alcohol    Comment: beer, occ   Drug use: No   Sexual activity: Yes  Other Topics Concern   Not on file  Social History Narrative   Married, 2 children - one is local   Married 40+ years   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    Plays guitar   Works out regularly.   Social Determinants of Health   Financial Resource Strain: Not on file  Food Insecurity: Not on file  Transportation Needs: Not on file  Physical Activity: Not on file  Stress: Not on file  Social Connections: Not on file  Intimate  Partner Violence: Not on file    Past Surgical History:  Procedure Laterality Date   admitted to [facility] chest pain  12/15/05   Dr. Juliann Pares   BLEPHAROPLASTY  10/11   Dr. Kemper Durie    bone spur removed Right    COLONOSCOPY  09/24/04   nml; Dr. Sharen Hint    CORONARY ARTERY BYPASS GRAFT N/A 05/29/2020   Procedure: CORONARY ARTERY BYPASS GRAFTING (CABG) TIMES THREE USING LIMA to LAD; ENDOSCOPIC HARVESTED RIGHT GREATER SAPHENOUS VEIN: SVG to PD; SVG to RAMUS.;  Surgeon: Delight Ovens, MD;  Location: [facility] OR;  Service: Open Heart Surgery;  Laterality: N/A;   EGD esophagitis by bx  01/26/06   gastritis bx neg h-pylori   ENDOVEIN HARVEST OF GREATER SAPHENOUS VEIN Right 05/29/2020   Procedure: ENDOVEIN HARVEST OF GREATER SAPHENOUS VEIN;  Surgeon: Delight Ovens, MD;  Location: [facility] OR;  Service: Open Heart Surgery;  Laterality: Right;   LEFT HEART CATH AND CORONARY ANGIOGRAPHY N/A 05/26/2020   Procedure: LEFT HEART CATH AND CORONARY ANGIOGRAPHY;  Surgeon: Lamar Blinks, MD;  Location: [facility] INVASIVE CV LAB;  Service: Cardiovascular;  Laterality: N/A;   LOWER EXTREMITY ANGIOGRAPHY Right 10/01/2020   Procedure: LOWER EXTREMITY ANGIOGRAPHY;  Surgeon: Annice Needy, MD;  Location: [facility] INVASIVE CV LAB;  Service: Cardiovascular;  Laterality: Right;   LOWER EXTREMITY ANGIOGRAPHY Right 08/12/2021   Procedure: LOWER EXTREMITY ANGIOGRAPHY;  Surgeon: Annice Needy, MD;  Location: [facility] INVASIVE CV LAB;  Service: Cardiovascular;  Laterality: Right;   LOWER EXTREMITY ANGIOGRAPHY Right 08/23/2021   Procedure: LOWER EXTREMITY ANGIOGRAPHY;  Surgeon: Annice Needy, MD;  Location: [facility] INVASIVE CV LAB;  Service: Cardiovascular;  Laterality: Right;   PARTIAL KNEE ARTHROPLASTY Left 09/06/2022   Procedure: UNICOMPARTMENTAL KNEE;  Surgeon: Teryl Lucy, MD;  Location: [facility] ORS;  Service: Orthopedics;  Laterality: Left;   POLYPECTOMY     right and left prostate needle bx  05/02/03   acute inflammation; Dr. Patsi Sears    SHOULDER  ARTHROSCOPY W/ ROTATOR CUFF REPAIR  12/2011   left   SHOULDER ARTHROSCOPY WITH ROTATOR CUFF REPAIR AND SUBACROMIAL DECOMPRESSION Right 05/24/2013   Procedure: RIGHT SHOULDER ARTHROSCOPY WITH ROTATOR CUFF REPAIR AND SUBACROMIAL DECOMPRESSION AND PARTIAL ACROMIOPLSTY WITH CORACOACROMIAL RELEASE, DISTAL CLAVICULECTOMY, LABRIAL DEBRIDEMENT;  Surgeon: Eulas Post, MD;  Location: [facility];  Service: Orthopedics;  Laterality: Right;   spect ETT nml  12/16/05   TEE WITHOUT CARDIOVERSION N/A 05/29/2020   Procedure:  TRANSESOPHAGEAL ECHOCARDIOGRAM (TEE);  Surgeon: Delight Ovens, MD;  Location: [facility] OR;  Service: Open Heart Surgery;  Laterality: N/A;   UPPER GASTROINTESTINAL ENDOSCOPY     WISDOM TOOTH EXTRACTION      Family History  Problem Relation Age of Onset   Stroke Mother    Glaucoma Other        grandmother at 17   Barrett's esophagus Maternal Uncle    Prostate cancer Neg Hx    Colon cancer Neg Hx    Colon polyps Neg Hx    Esophageal cancer Neg Hx    Rectal cancer Neg Hx    Stomach cancer Neg Hx     Allergies  Allergen Reactions   Ciprofloxacin Hcl Rash   Quinolones Rash       Latest Ref Rng & Units 08/26/2022    9:32 AM 06/01/2020    2:21 AM 05/31/2020    4:01 AM  CBC  WBC 4.0 - 10.5 K/uL 6.3  9.5  9.5   Hemoglobin 13.0 - 17.0 g/dL 16.1  09.6  04.5   Hematocrit 39.0 - 52.0 % 44.0  32.9  31.4   Platelets 150 - 400 K/uL 195  141  117       CMP     Component Value Date/Time   NA 136 08/26/2022 0932   NA 140 05/12/2012 1240   K 4.3 08/26/2022 0932   K 4.2 05/12/2012 1240   CL 107 08/26/2022 0932   CL 107 05/12/2012 1240   CO2 23 08/26/2022 0932   CO2 26 05/12/2012 1240   GLUCOSE 100 (H) 08/26/2022 0932   GLUCOSE 101 (H) 05/12/2012 1240   BUN 19 08/26/2022 0932   BUN 21 (H) 05/12/2012 1240   CREATININE 0.90 08/26/2022 0932   CREATININE 0.86 05/12/2012 1240   CALCIUM 9.0 08/26/2022 0932   CALCIUM 8.9 05/12/2012 1240   PROT 7.1 05/25/2020 1715    PROT 7.4 01/17/2012 0807   ALBUMIN 4.4 05/25/2020 1715   ALBUMIN 3.9 01/17/2012 0807   AST 34 05/25/2020 1715   AST 57 (H) 01/17/2012 0807   ALT 29 05/25/2020 1715   ALT 49 01/17/2012 0807   ALKPHOS 73 05/25/2020 1715   ALKPHOS 74 01/17/2012 0807   BILITOT 0.8 05/25/2020 1715   BILITOT 0.8 01/17/2012 0807   GFRNONAA >60 08/26/2022 0932   GFRNONAA >60 05/12/2012 1240   GFRAA >60 05/12/2012 1240     No results found.     Assessment & Plan:   1. Peripheral arterial disease with history of revascularization (HCC)  Recommend:  The patient has evidence of atherosclerosis of the lower extremities with no claudication.  The patient does not voice lifestyle limiting changes at this point in time.  Noninvasive studies do not suggest clinically significant change.  No invasive studies, angiography or surgery at this time The patient should continue walking and begin a more formal exercise program.  The patient should continue antiplatelet therapy and aggressive treatment of the lipid abnormalities  No changes in the patient's medications at this time  Continued surveillance is indicated as atherosclerosis is likely to progress with time.    The patient will continue follow up with noninvasive studies as ordered.   Patient will follow-up in 6 months 2. Hyperlipidemia, mixed Continue statin as ordered and reviewed, no changes at this time  3. Primary hypertension Continue antihypertensive medications as already ordered, these medications have been reviewed and there are no changes at this time.   Current Outpatient Medications  on File Prior to Visit  Medication Sig Dispense Refill   acetaminophen (TYLENOL) 500 MG tablet Take 1,000 mg by mouth every 6 (six) hours as needed for moderate pain or mild pain.     Azelastine-Fluticasone 137-50 MCG/ACT SUSP Place 1 spray into both nostrils daily as needed (Congestion).     Cholecalciferol (VITAMIN D) 2000 UNITS CAPS Take 2,000 Units by  mouth daily.     clopidogrel (PLAVIX) 75 MG tablet Take 1 tablet (75 mg total) by mouth daily. 30 tablet 11   Cyanocobalamin (VITAMIN B-12) 5000 MCG SUBL Place 5,000 mcg under the tongue daily.     icosapent Ethyl (VASCEPA) 1 g capsule Take 1 tablet by mouth 4 (four) times daily.     loratadine (CLARITIN) 10 MG tablet Take 10 mg by mouth daily.     montelukast (SINGULAIR) 10 MG tablet Take 10 mg by mouth at bedtime.     rosuvastatin (CRESTOR) 40 MG tablet Take 40 mg by mouth daily.     SYMBICORT 80-4.5 MCG/ACT inhaler Inhale 2 puffs into the lungs 2 (two) times daily.     tadalafil (CIALIS) 5 MG tablet Take 5 mg by mouth daily.     albuterol (VENTOLIN HFA) 108 (90 Base) MCG/ACT inhaler Inhale 2 puffs into the lungs every 4 (four) hours as needed for wheezing or shortness of breath.     ondansetron (ZOFRAN) 4 MG tablet Take 1 tablet (4 mg total) by mouth every 8 (eight) hours as needed for nausea or vomiting. 10 tablet 0   oxyCODONE (ROXICODONE) 5 MG immediate release tablet Take 1 tablet (5 mg total) by mouth every 4 (four) hours as needed for severe pain. 30 tablet 0   sennosides-docusate sodium (SENOKOT-S) 8.6-50 MG tablet Take 2 tablets by mouth daily. 30 tablet 1   No current facility-administered medications on file prior to visit.    There are no Patient Instructions on file for this visit. No follow-ups on file.   Georgiana Spinner, NP

## 2022-12-15 LAB — VAS US ABI WITH/WO TBI
Left ABI: 1.15
Right ABI: 1.1

## 2023-01-05 ENCOUNTER — Encounter: Payer: Self-pay | Admitting: Family Medicine

## 2023-01-05 ENCOUNTER — Ambulatory Visit (INDEPENDENT_AMBULATORY_CARE_PROVIDER_SITE_OTHER): Payer: Medicare Other | Admitting: Family Medicine

## 2023-01-05 VITALS — BP 122/70 | HR 81 | Temp 97.4°F | Ht 67.0 in | Wt 205.0 lb

## 2023-01-05 DIAGNOSIS — S76212A Strain of adductor muscle, fascia and tendon of left thigh, initial encounter: Secondary | ICD-10-CM

## 2023-01-05 NOTE — Progress Notes (Signed)
Patient thinks he may have pulled a muscle in groin or has a hernia. This has been going on x 4 months or so. Patient stated that the pain gets better if he stretches out.  Pain with golf swing.    Had 7 weeks of PT for L knee partial placements.  Had groin pain after surgery.    No R sided pain.  No testicle pain.  No burning with urination.  No known skin changes, hasn't felt a lump or a mass.    Meds, vitals, and allergies reviewed.   ROS: Per HPI unless specifically indicated in ROS section   Nad Ncat Rrr Ctab Abdomen soft.  Nontender. No abnormal scrotal mass noted.  No hernia noted on exam.  No mass or bulge felt.  No overlying skin changes in the left inguinal area.

## 2023-01-05 NOTE — Patient Instructions (Signed)
Likely a groin strain.  If you feel a hernia, let me know.   Gently stretch and let me know if you don't get a call about PT.  Take care.  Glad to see you.

## 2023-01-06 ENCOUNTER — Encounter: Payer: Self-pay | Admitting: *Deleted

## 2023-01-08 DIAGNOSIS — S76219A Strain of adductor muscle, fascia and tendon of unspecified thigh, initial encounter: Secondary | ICD-10-CM | POA: Insufficient documentation

## 2023-01-08 NOTE — Assessment & Plan Note (Signed)
Likely a groin strain.  If he feels a hernia, then I asked him to let me know.   Gently stretch and he can let me know if he does not get a call about PT.

## 2023-02-01 ENCOUNTER — Other Ambulatory Visit: Payer: Self-pay | Admitting: Urology

## 2023-02-01 ENCOUNTER — Telehealth: Payer: Self-pay | Admitting: Internal Medicine

## 2023-02-01 NOTE — Telephone Encounter (Signed)
Will fax notes to requesting office to see notes from Pre op APP Jari Favre, PAC.

## 2023-02-01 NOTE — Telephone Encounter (Signed)
   Pre-operative Risk Assessment    Patient Name: Clayton FELICETTI, PhD  DOB: Jan 24, 1946 MRN: [id number]      Request for Surgical Clearance    Procedure:   aquablation of prostate   Date of Surgery:  Clearance 04/04/23                                 Surgeon:  Dr. Mena Goes Surgeon's Group or Practice Name:  [facility] Phone number:  [phone] Fax number:  [phone]   Type of Clearance Requested:   - Medical  - Pharmacy:  Hold Clopidogrel (Plavix) 5 days prior    Type of Anesthesia:  General    Additional requests/questions:      Melissa Noon   02/01/2023, 12:22 PM

## 2023-02-27 ENCOUNTER — Telehealth: Payer: Self-pay | Admitting: *Deleted

## 2023-02-27 NOTE — Telephone Encounter (Signed)
S/w pt moved pt to Gillian Shields, NP schedule due to pt is being seen for Pre Op clearance. Was on MS[facility] lipid schedule.

## 2023-03-01 ENCOUNTER — Encounter (HOSPITAL_BASED_OUTPATIENT_CLINIC_OR_DEPARTMENT_OTHER): Payer: Self-pay

## 2023-03-01 ENCOUNTER — Ambulatory Visit (HOSPITAL_BASED_OUTPATIENT_CLINIC_OR_DEPARTMENT_OTHER): Payer: Medicare Other | Admitting: Nurse Practitioner

## 2023-03-03 ENCOUNTER — Other Ambulatory Visit: Payer: Self-pay

## 2023-03-03 ENCOUNTER — Encounter (HOSPITAL_BASED_OUTPATIENT_CLINIC_OR_DEPARTMENT_OTHER): Payer: Self-pay | Admitting: Family

## 2023-03-03 ENCOUNTER — Encounter (HOSPITAL_BASED_OUTPATIENT_CLINIC_OR_DEPARTMENT_OTHER): Payer: Self-pay

## 2023-03-03 ENCOUNTER — Ambulatory Visit (HOSPITAL_BASED_OUTPATIENT_CLINIC_OR_DEPARTMENT_OTHER): Payer: Medicare Other | Admitting: Family

## 2023-03-03 VITALS — BP 138/78 | HR 71 | Ht 67.0 in | Wt 185.0 lb

## 2023-03-03 DIAGNOSIS — Z951 Presence of aortocoronary bypass graft: Secondary | ICD-10-CM | POA: Diagnosis not present

## 2023-03-03 DIAGNOSIS — I10 Essential (primary) hypertension: Secondary | ICD-10-CM

## 2023-03-03 DIAGNOSIS — Z01810 Encounter for preprocedural cardiovascular examination: Secondary | ICD-10-CM | POA: Diagnosis not present

## 2023-03-03 DIAGNOSIS — I2510 Atherosclerotic heart disease of native coronary artery without angina pectoris: Secondary | ICD-10-CM | POA: Diagnosis not present

## 2023-03-03 NOTE — Patient Instructions (Signed)
Medication Instructions:  Your physician recommends that you continue on your current medications as directed. Please refer to the Current Medication list given to you today.  Please hold plavix 5 days prior to procedure   *If you need a refill on your cardiac medications before your next appointment, please call your pharmacy*   Testing/Procedures: How to Prepare for Your Cardiac PET/CT Stress Test:  1. Please do not take these medications before your test:   Medications that may interfere with the cardiac pharmacological stress agent (ex. nitrates - including erectile dysfunction medications, isosorbide mononitrate, tamulosin or beta-blockers) the day of the exam. (Erectile dysfunction medication should be held for at least 72 hrs prior to test) Theophylline containing medications for 12 hours. Dipyridamole 48 hours prior to the test. Your remaining medications may be taken with water.  2. Nothing to eat or drink, except water, 3 hours prior to arrival time.   NO caffeine/decaffeinated products, or chocolate 12 hours prior to arrival.  3. NO perfume, cologne or lotion  4. Total time is 1 to 2 hours; you may want to bring reading material for the waiting time.  5. Please report to Radiology at the [facility] Main Entrance 30 minutes early for your test.  [address] [place], [place] [address]  6. Please report to Radiology at [facility] Main Entrance, medical mall, 30 mins prior to your test.  [address]  [place], [place]  [phone]  Diabetic Preparation:  Hold oral medications. You may take NPH and Lantus insulin. Do not take Humalog or Humulin R (Regular Insulin) the day of your test. Check blood sugars prior to leaving the house. If able to eat breakfast prior to 3 hour fasting, you may take all medications, including your insulin, Do not worry if you miss your breakfast dose of insulin - start at your next meal.  IF YOU  THINK YOU MAY BE PREGNANT, OR ARE NURSING PLEASE INFORM THE TECHNOLOGIST.  In preparation for your appointment, medication and supplies will be purchased.  Appointment availability is limited, so if you need to cancel or reschedule, please call the Radiology Department at [phone] [facility]) OR [phone] [facility])  24 hours in advance to avoid a cancellation fee of $100.00  What to Expect After you Arrive:  Once you arrive and check in for your appointment, you will be taken to a preparation room within the Radiology Department.  A technologist or Nurse will obtain your medical history, verify that you are correctly prepped for the exam, and explain the procedure.  Afterwards,  an IV will be started in your arm and electrodes will be placed on your skin for EKG monitoring during the stress portion of the exam. Then you will be escorted to the PET/CT scanner.  There, staff will get you positioned on the scanner and obtain a blood pressure and EKG.  During the exam, you will continue to be connected to the EKG and blood pressure machines.  A small, safe amount of a radioactive tracer will be injected in your IV to obtain a series of pictures of your heart along with an injection of a stress agent.    After your Exam:  It is recommended that you eat a meal and drink a caffeinated beverage to counter act any effects of the stress agent.  Drink plenty of fluids for the remainder of the day and urinate frequently for the first couple of hours after the exam.  Your  doctor will inform you of your test results within 7-10 business days.  For more information and frequently asked questions, please visit our website : [email or web address]  For questions about your test or how to prepare for your test, please call: Cardiac Imaging Nurse Navigators Office: [phone]  Follow-Up: At [facility], you and your health needs are our priority.  As part of our continuing mission to  provide you with exceptional heart care, we have created designated Provider Care Teams.  These Care Teams include your primary Cardiologist (physician) and Advanced Practice Providers (APPs -  Physician Assistants and Nurse Practitioners) who all work together to provide you with the care you need, when you need it.  We recommend signing up for the patient portal called "MyChart".  Sign up information is provided on this After Visit Summary.  MyChart is used to connect with patients for Virtual Visits (Telemedicine).  Patients are able to view lab/test results, encounter notes, upcoming appointments, etc.  Non-urgent messages can be sent to your provider as well.   To learn more about what you can do with MyChart, go to [email or web address].    Your next appointment:   Follow up as scheduled

## 2023-03-03 NOTE — Progress Notes (Signed)
Cardiology Office Note:  .   Date:  03/03/2023  ID:  Lianne Moris, PhD, DOB Nov 28, 1945, MRN [id number] PCP: Joaquim Nam, MD  [facility] Providers Cardiologist:  Lamar Blinks, MD    History of Present Illness: .   ALIOU YOON, PhD is a 77 y.o. male coronary artery disease s/p CABGx3 (05/29/20), GERD, hyperlipidemia, PAD with peripheral stents, dysbetalipoproteinemia, HTN.   Lipid NMR 05/2021 with LDL-P less than 300, LDL-C 30, HDL-C 43, triglycerides 154, total cholesterol 99. HLD-P of 34 and small LDL-P undetectable <90nmol/L. 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 Lp(a) previously negative. He was encouraged to continue Rosuvastatin 40mg  daily and Vascepa 2g BID.   Presents today for preoperative clearance for aqua ablation of prostate with request to hold Plavix 5 days prior by Dr. Mena Goes.  Presents today for follow-up independently.  His previously cardiologist has moved out of the area and would like to establish general cardiology care as well as his lipid care with Dr. Rennis Golden.  Doing well from a cardiac perspective. Walking 2-3 miles per day as well as golfing for exercise.  Recently lost 25 lbs in 7 weeks through intentional diet and exercise changes. Reports no shortness of breath nor dyspnea on exertion. Reports no chest pain, pressure, or tightness. No edema, orthopnea, PND. Reports no palpitations.  Interested in knowing how his bypass grafts are working as CABG 3 years ago.  ROS: Please see the history of present illness.    All other systems reviewed and are negative.   Studies Reviewed: Marland Kitchen   EKG Interpretation Date/Time:  Friday March 03 2023 08:27:50 EDT Ventricular Rate:  71 PR Interval:  164 QRS Duration:  92 QT Interval:  400 QTC Calculation: 434 R Axis:   -4  Text Interpretation: Normal sinus rhythm Normal ECG Confirmed by Gillian Shields ([id number]) on 03/03/2023 10:51:10 AM    Cardiac Studies &  Procedures   CARDIAC CATHETERIZATION  CARDIAC CATHETERIZATION 05/26/2020  Narrative  Ost LAD to Prox LAD lesion is 95% stenosed.  Prox LAD to Mid LAD lesion is 75% stenosed with 75% stenosed side branch in 1st Sept.  Ramus lesion is 90% stenosed.  Ost Cx to Prox Cx lesion is 99% stenosed.  Mid Cx lesion is 100% stenosed.  Prox RCA lesion is 100% stenosed.  Mid RCA lesion is 100% stenosed.  77 year old male with severe hyperlipidemia hypertension with progressive symptoms of shortness of breath anginal symptoms over the last several weeks with some chest pain at rest and a slight elevation of troponin  Cardiogram showing normal LV systolic function with ejection fraction to 60% and no evidence of significant valvular heart disease  Left ventricle angiogram showing normal LV systolic function  Patient has critical distal left main ostial LAD ostial ramus ostial right coronary artery and ostial circumflex artery stenosis   Plan Continue nitrates for chest discomfort Aspirin beta-blocker for hypertension control and cardiovascular disease Discussion for transfer for coronary artery bypass graft Cardiac rehabilitation  Findings Coronary Findings Diagnostic  Dominance: Left  Left Anterior Descending Ost LAD to Prox LAD lesion is 95% stenosed. Prox LAD to Mid LAD lesion is 75% stenosed with 75% stenosed side branch in 1st Sept.  Ramus Intermedius Ramus lesion is 90% stenosed.  Left Circumflex Ost Cx to Prox Cx lesion is 99% stenosed. Mid Cx lesion is 100% stenosed.  Right Coronary Artery Collaterals Mid RCA filled by collaterals from Ost RCA.  Prox RCA lesion is  100% stenosed. Mid RCA lesion is 100% stenosed.  Intervention  No interventions have been documented.     ECHOCARDIOGRAM  ECHOCARDIOGRAM COMPLETE 05/26/2020  Narrative ECHOCARDIOGRAM REPORT    Patient Name:   RONZELL CANNIZZARO Marek Date of Exam: 05/26/2020 Medical Rec #:  [id number]        Height:        66.0 in Accession #:    [id number]       Weight:       200.0 lb Date of Birth:  14-May-1946       BSA:          2.000 m Patient Age:    73 years         BP:           178/85 mmHg Patient Gender: M                HR:           49 bpm. Exam Location:  [facility]  Procedure: 2D Echo, Cardiac Doppler and Color Doppler  Indications:     Chest pain 786.50  History:         Patient has no prior history of Echocardiogram examinations. CAD; Risk Factors:Hypertension and Dyslipidemia.  Sonographer:     Cristela Blue RDCS (AE) Referring Phys:  [id number] Lucile Shutters Diagnosing Phys: Arnoldo Hooker MD  IMPRESSIONS   1. Left ventricular ejection fraction, by estimation, is 60 to 65%. The left ventricle has normal function. The left ventricle has no regional wall motion abnormalities. Left ventricular diastolic parameters were normal. 2. Right ventricular systolic function is normal. The right ventricular size is normal. 3. The mitral valve is normal in structure. Trivial mitral valve regurgitation. 4. The aortic valve is normal in structure. Aortic valve regurgitation is not visualized.  FINDINGS Left Ventricle: Left ventricular ejection fraction, by estimation, is 60 to 65%. The left ventricle has normal function. The left ventricle has no regional wall motion abnormalities. The left ventricular internal cavity size was normal in size. There is no left ventricular hypertrophy. Left ventricular diastolic parameters were normal.  Right Ventricle: The right ventricular size is normal. No increase in right ventricular wall thickness. Right ventricular systolic function is normal.  Left Atrium: Left atrial size was normal in size.  Right Atrium: Right atrial size was normal in size.  Pericardium: There is no evidence of pericardial effusion.  Mitral Valve: The mitral valve is normal in structure. Trivial mitral valve regurgitation.  Tricuspid Valve: The tricuspid valve is normal in structure. Tricuspid  valve regurgitation is trivial.  Aortic Valve: The aortic valve is normal in structure. Aortic valve regurgitation is not visualized. Aortic valve mean gradient measures 2.0 mmHg. Aortic valve peak gradient measures 3.9 mmHg. Aortic valve area, by VTI measures 4.73 cm.  Pulmonic Valve: The pulmonic valve was normal in structure. Pulmonic valve regurgitation is not visualized.  Aorta: The aortic root and ascending aorta are structurally normal, with no evidence of dilitation.  IAS/Shunts: No atrial level shunt detected by color flow Doppler.   LEFT VENTRICLE PLAX 2D LVIDd:         4.36 cm LVIDs:         2.80 cm LV PW:         1.32 cm LV IVS:        1.21 cm LVOT diam:     2.30 cm LV SV:         95 LV SV Index:   48 LVOT Area:  4.15 cm   RIGHT VENTRICLE RV Basal diam:  3.72 cm RV S prime:     12.20 cm/s TAPSE (M-mode): 3.1 cm  LEFT ATRIUM             Index       RIGHT ATRIUM           Index LA diam:        3.80 cm 1.90 cm/m  RA Area:     17.70 cm LA Vol (A2C):   64.8 ml 32.40 ml/m RA Volume:   48.70 ml  24.35 ml/m LA Vol (A4C):   58.4 ml 29.20 ml/m LA Biplane Vol: 61.7 ml 30.85 ml/m AORTIC VALVE                   PULMONIC VALVE AV Area (Vmax):    3.60 cm    PV Vmax:        0.57 m/s AV Area (Vmean):   4.05 cm    PV Peak grad:   1.3 mmHg AV Area (VTI):     4.73 cm    RVOT Peak grad: 2 mmHg AV Vmax:           98.80 cm/s AV Vmean:          64.750 cm/s AV VTI:            0.201 m AV Peak Grad:      3.9 mmHg AV Mean Grad:      2.0 mmHg LVOT Vmax:         85.50 cm/s LVOT Vmean:        63.100 cm/s LVOT VTI:          0.229 m LVOT/AV VTI ratio: 1.14  AORTA Ao Root diam: 3.10 cm  MITRAL VALVE                TRICUSPID VALVE MV Area (PHT): 2.39 cm     TR Peak grad:   13.1 mmHg MV Decel Time: 318 msec     TR Vmax:        181.00 cm/s MV E velocity: 84.00 cm/s MV A velocity: 116.00 cm/s  SHUNTS MV E/A ratio:  0.72         Systemic VTI:  0.23 m Systemic Diam: 2.30  cm  Arnoldo Hooker MD Electronically signed by Arnoldo Hooker MD Signature Date/Time: 05/26/2020/1:42:02 PM    Final   TEE  ECHO INTRAOPERATIVE TEE 05/29/2020  Narrative *INTRAOPERATIVE TRANSESOPHAGEAL REPORT *    Patient Name:   JAKEB CAMEJO Kirkpatrick Date of Exam: 05/29/2020 Medical Rec #:  [id number]        Height:       66.0 in Accession #:    [id number]       Weight:       197.1 lb Date of Birth:  1945/11/26       BSA:          1.99 m Patient Age:    73 years         BP:           149/88 mmHg Patient Gender: M                HR:           49 bpm. Exam Location:  Anesthesiology  Transesophogeal exam was perform intraoperatively during surgical procedure. Patient was closely monitored under general anesthesia during the entirety of examination.  Indications:     CAD Native Vessel Sonographer:  Thurman Coyer RDCS (AE) Performing Phys: Shona Simpson MD Diagnosing Phys: Shona Simpson MD  Complications: No known complications during this procedure. POST-OP IMPRESSIONS - Left Ventricle: LVEF > 60%, no RWMA's noted. CO > 6.0L/min, CI > 3L/m/min. - Right Ventricle: The right ventricle appears unchanged from pre-bypass. - Aorta: No dissection noted after cannula removed. - Left Atrium: The left atrium appears unchanged from pre-bypass. - Aortic Valve: The aortic valve appears unchanged from pre-bypass. - Mitral Valve: The mitral valve appears unchanged from pre-bypass. - Tricuspid Valve: The tricuspid valve appears unchanged from pre-bypass. - Interventricular Septum: The interventricular septum appears unchanged from pre-bypass. - Pericardium: The pericardium appears unchanged from pre-bypass.  PRE-OP FINDINGS Left Ventricle: The left ventricle has hyperdynamic systolic function, with an ejection fraction of >65%. The cavity size was normal. There is no increase in left ventricular wall thickness. No evidence of left ventricular regional wall motion abnormalities.  Right  Ventricle: The right ventricle has normal systolic function. The cavity was normal. There is no increase in right ventricular wall thickness. PA catheter traversing the RV into the main pulmonary artery.  Left Atrium: Left atrial size was normal in size. The left atrial appendage is well visualized and there is no evidence of thrombus present. Left atrial appendage velocity is normal at greater than 40 cm/s.  Right Atrium: Right atrial size was normal in size. PA catheter traversing the RA into the RV.  Interatrial Septum: No PFO or ASD detected by color flow Doppler.  Pericardium: There is no evidence of pericardial effusion. There is no pleural effusion.  Mitral Valve: The mitral valve is normal in structure. No thickening of the mitral valve leaflet. No calcification of the mitral valve leaflet. Mitral valve regurgitation is trivial by color flow Doppler. The MR jet is centrally-directed. There is no evidence of mitral valve vegetation. There is no evidence of mitral stenosis.  Tricuspid Valve: The tricuspid valve was normal in structure. Tricuspid valve regurgitation was not visualized by color flow Doppler. There is no evidence of tricuspid valve vegetation.  Aortic Valve: The aortic valve is tricuspid aortic valve regurgitation was not visualized by color flow Doppler. There is no evidence of aortic valve stenosis. Peak gradient of 7 mmhg and mean gradient of 4 mmhg. There is no evidence of a vegetation on the aortic valve.  Pulmonic Valve: The pulmonic valve was normal in structure. Pulmonic valve regurgitation is not visualized by color flow Doppler.   Aorta: The aortic root, ascending aorta and aortic arch are normal in size and structure. Minimal calcfication of the descending of the aortic arch and descending aorta.  Pulmonary Artery: The pulmonary artery is of normal size. PA catheter present.   +-------------+--------++ AORTIC VALVE           +-------------+--------++ AV Mean Grad:4.0 mmHg +-------------+--------++   Shona Simpson MD Electronically signed by Shona Simpson MD Signature Date/Time: 05/29/2020/2:01:58 PM    Final            Risk Assessment/Calculations:             Physical Exam:   VS:  BP 138/78   Pulse 71   Ht 5\' 7"  (1.702 m)   Wt 185 lb (83.9 kg)   BMI 28.98 kg/m    Wt Readings from Last 3 Encounters:  03/03/23 185 lb (83.9 kg)  01/05/23 205 lb (93 kg)  12/12/22 206 lb (93.4 kg)    GEN: Well nourished, well developed in no acute distress NECK: No JVD; No  carotid bruits CARDIAC: RRR, no murmurs, rubs, gallops RESPIRATORY:  Clear to auscultation without rales, wheezing or rhonchi  ABDOMEN: Soft, non-tender, non-distended EXTREMITIES:  No edema; No deformity   ASSESSMENT AND PLAN: .    Preop clearance - Pending transurethral water jet ablation of prostate. According to the Revised Cardiac Risk Index (RCRI), his Perioperative Risk of Major Cardiac Event is (%): 6.6. His Functional Capacity in METs is: 7.34 according to the Duke Activity Status Index (DASI). Per AHA/ACC guidelines, he is deemed acceptable risk for the planned procedure without additional cardiovascular testing. Will route to surgical team so they are aware.  May hold Plavix 5 days prior to planned procedure.   HLD, LDL goal <70 - Continue Rousvastatin 40mg  daily, Vascepa 2g BID.   Elevated BP - known element white coat hypertension. BP at home routinely <130/80. No indication for antihypertensive therapy at this time.   CAD s/p CABGx3 2021 - Stable with no anginal symptoms. No indication for ischemic evaluation.  GDMT Plavix, Rosuvastatin. Recommend aiming for 150 minutes of moderate intensity activity per week and following a heart healthy diet.  As is 3 years from CABG per his request plan for cardiac PET to assess bypass grafts.  PAD - Prior RLE intervention with Dr. Wyn Quaker. Last intervention 08/23/21. Continue Plavix,  Rosuvastatin. No new claudication.     Informed Consent   Shared Decision Making/Informed Consent The risks [chest pain, shortness of breath, cardiac arrhythmias, dizziness, blood pressure fluctuations, myocardial infarction, stroke/transient ischemic attack, nausea, vomiting, allergic reaction, radiation exposure, metallic taste sensation and life-threatening complications (estimated to be 1 in 10,000)], benefits (risk stratification, diagnosing coronary artery disease, treatment guidance) and alternatives of a cardiac PET stress test were discussed in detail with Mr. Littleton and he agrees to proceed.     Dispo: follow up in November as scheduled with Dr. Rennis Golden  Signed, Alver Sorrow, NP

## 2023-03-14 ENCOUNTER — Ambulatory Visit (INDEPENDENT_AMBULATORY_CARE_PROVIDER_SITE_OTHER): Payer: Medicare Other

## 2023-03-14 VITALS — Wt 183.0 lb

## 2023-03-14 DIAGNOSIS — Z0000 Encounter for general adult medical examination without abnormal findings: Secondary | ICD-10-CM

## 2023-03-14 NOTE — Progress Notes (Addendum)
Anesthesia Review:  PCP: Clayton Powell LOV 01/05/23  Cardiologist : Clayton Powell  Clayton Walker,NP LOV 03/03/23  [facility]- Clayton Powell LOV 12/12/22  Chest x-ray : EKG : 03/03/23  Echo : 2021  Stress test: Cardiac Cath :  Activity level: can do a flight of stairs without difficulty  Sleep Study/ CPAP : none  Fasting Blood Sugar :      / Checks Blood Sugar -- times a day:   Blood Thinner/ Instructions /Last Dose: ASA / Instructions/ Last Dose :    Plavix - Stop 5 days prior per pt   At time of preop appt on 03/20/23 lab attempted to do venipuncture x 3  with no success.  PT states " I am a hard stick".   Pt was drinking fluids upon entering preop appt.  Labs to be done DOS.  Called and LVMM for Clayton Powell at office of DR Clayton Powell in regards to above.    Blood pressure was 163/93 at preop appt.  PT voices no complaints.

## 2023-03-14 NOTE — Patient Instructions (Signed)
Clayton Powell , Thank you for taking time to come for your Medicare Wellness Visit. I appreciate your ongoing commitment to your health goals. Please review the following plan we discussed and let me know if I can assist you in the future.   Referrals/Orders/Follow-Ups/Clinician Recommendations: stay healthy and active   This is a list of the screening recommended for you and due dates:  Health Maintenance  Topic Date Due   Medicare Annual Wellness Visit  Never done   Hepatitis C Screening  Never done   Zoster (Shingles) Vaccine (1 of 2) 06/23/1996   DTaP/Tdap/Td vaccine (3 - Tdap) 02/10/2019   COVID-19 Vaccine (7 - 2023-24 season) 10/27/2022   Flu Shot  02/23/2023   Colon Cancer Screening  01/19/2025   Pneumonia Vaccine  Completed   HPV Vaccine  Aged Out    Advanced directives: (In Chart) A copy of your advanced directives are scanned into your chart should your provider ever need it.  Next Medicare Annual Wellness Visit scheduled for next year: Yes  Preventive Care 25 Years and Older, Male  Preventive care refers to lifestyle choices and visits with your health care provider that can promote health and wellness. What does preventive care include? A yearly physical exam. This is also called an annual well check. Dental exams once or twice a year. Routine eye exams. Ask your health care provider how often you should have your eyes checked. Personal lifestyle choices, including: Daily care of your teeth and gums. Regular physical activity. Eating a healthy diet. Avoiding tobacco and drug use. Limiting alcohol use. Practicing safe sex. Taking low doses of aspirin every day. Taking vitamin and mineral supplements as recommended by your health care provider. What happens during an annual well check? The services and screenings done by your health care provider during your annual well check will depend on your age, overall health, lifestyle risk factors, and family history of  disease. Counseling  Your health care provider may ask you questions about your: Alcohol use. Tobacco use. Drug use. Emotional well-being. Home and relationship well-being. Sexual activity. Eating habits. History of falls. Memory and ability to understand (cognition). Work and work Astronomer. Screening  You may have the following tests or measurements: Height, weight, and BMI. Blood pressure. Lipid and cholesterol levels. These may be checked every 5 years, or more frequently if you are over 25 years old. Skin check. Lung cancer screening. You may have this screening every year starting at age 21 if you have a 30-pack-year history of smoking and currently smoke or have quit within the past 15 years. Fecal occult blood test (FOBT) of the stool. You may have this test every year starting at age 23. Flexible sigmoidoscopy or colonoscopy. You may have a sigmoidoscopy every 5 years or a colonoscopy every 10 years starting at age 72. Prostate cancer screening. Recommendations will vary depending on your family history and other risks. Hepatitis C blood test. Hepatitis B blood test. Sexually transmitted disease (STD) testing. Diabetes screening. This is done by checking your blood sugar (glucose) after you have not eaten for a while (fasting). You may have this done every 1-3 years. Abdominal aortic aneurysm (AAA) screening. You may need this if you are a current or former smoker. Osteoporosis. You may be screened starting at age 24 if you are at high risk. Talk with your health care provider about your test results, treatment options, and if necessary, the need for more tests. Vaccines  Your health care provider may recommend  certain vaccines, such as: Influenza vaccine. This is recommended every year. Tetanus, diphtheria, and acellular pertussis (Tdap, Td) vaccine. You may need a Td booster every 10 years. Zoster vaccine. You may need this after age 58. Pneumococcal 13-valent  conjugate (PCV13) vaccine. One dose is recommended after age 25. Pneumococcal polysaccharide (PPSV23) vaccine. One dose is recommended after age 68. Talk to your health care provider about which screenings and vaccines you need and how often you need them. This information is not intended to replace advice given to you by your health care provider. Make sure you discuss any questions you have with your health care provider. Document Released: 08/07/2015 Document Revised: 03/30/2016 Document Reviewed: 05/12/2015 Elsevier Interactive Patient Education  2017 [employer].  Fall Prevention in the Home Falls can cause injuries. They can happen to people of all ages. There are many things you can do to make your home safe and to help prevent falls. What can I do on the outside of my home? Regularly fix the edges of walkways and driveways and fix any cracks. Remove anything that might make you trip as you walk through a door, such as a raised step or threshold. Trim any bushes or trees on the path to your home. Use bright outdoor lighting. Clear any walking paths of anything that might make someone trip, such as rocks or tools. Regularly check to see if handrails are loose or broken. Make sure that both sides of any steps have handrails. Any raised decks and porches should have guardrails on the edges. Have any leaves, snow, or ice cleared regularly. Use sand or salt on walking paths during winter. Clean up any spills in your garage right away. This includes oil or grease spills. What can I do in the bathroom? Use night lights. Install grab bars by the toilet and in the tub and shower. Do not use towel bars as grab bars. Use non-skid mats or decals in the tub or shower. If you need to sit down in the shower, use a plastic, non-slip stool. Keep the floor dry. Clean up any water that spills on the floor as soon as it happens. Remove soap buildup in the tub or shower regularly. Attach bath mats  securely with double-sided non-slip rug tape. Do not have throw rugs and other things on the floor that can make you trip. What can I do in the bedroom? Use night lights. Make sure that you have a light by your bed that is easy to reach. Do not use any sheets or blankets that are too big for your bed. They should not hang down onto the floor. Have a firm chair that has side arms. You can use this for support while you get dressed. Do not have throw rugs and other things on the floor that can make you trip. What can I do in the kitchen? Clean up any spills right away. Avoid walking on wet floors. Keep items that you use a lot in easy-to-reach places. If you need to reach something above you, use a strong step stool that has a grab bar. Keep electrical cords out of the way. Do not use floor polish or wax that makes floors slippery. If you must use wax, use non-skid floor wax. Do not have throw rugs and other things on the floor that can make you trip. What can I do with my stairs? Do not leave any items on the stairs. Make sure that there are handrails on both sides of the  stairs and use them. Fix handrails that are broken or loose. Make sure that handrails are as long as the stairways. Check any carpeting to make sure that it is firmly attached to the stairs. Fix any carpet that is loose or worn. Avoid having throw rugs at the top or bottom of the stairs. If you do have throw rugs, attach them to the floor with carpet tape. Make sure that you have a light switch at the top of the stairs and the bottom of the stairs. If you do not have them, ask someone to add them for you. What else can I do to help prevent falls? Wear shoes that: Do not have high heels. Have rubber bottoms. Are comfortable and fit you well. Are closed at the toe. Do not wear sandals. If you use a stepladder: Make sure that it is fully opened. Do not climb a closed stepladder. Make sure that both sides of the stepladder  are locked into place. Ask someone to hold it for you, if possible. Clearly mark and make sure that you can see: Any grab bars or handrails. First and last steps. Where the edge of each step is. Use tools that help you move around (mobility aids) if they are needed. These include: Canes. Walkers. Scooters. Crutches. Turn on the lights when you go into a dark area. Replace any light bulbs as soon as they burn out. Set up your furniture so you have a clear path. Avoid moving your furniture around. If any of your floors are uneven, fix them. If there are any pets around you, be aware of where they are. Review your medicines with your doctor. Some medicines can make you feel dizzy. This can increase your chance of falling. Ask your doctor what other things that you can do to help prevent falls. This information is not intended to replace advice given to you by your health care provider. Make sure you discuss any questions you have with your health care provider. Document Released: 05/07/2009 Document Revised: 12/17/2015 Document Reviewed: 08/15/2014 Elsevier Interactive Patient Education  2017 [employer].

## 2023-03-14 NOTE — Progress Notes (Signed)
Subjective:   Clayton Moris, PhD is a 77 y.o. male who presents for an Initial Medicare Annual Wellness Visit.  Visit Complete: Virtual  I connected with  Clayton Moris, PhD on 03/14/23 by a audio enabled telemedicine application and verified that I am speaking with the correct person using two identifiers.  Patient Location: Home  Provider Location: Home Office  I discussed the limitations of evaluation and management by telemedicine. The patient expressed understanding and agreed to proceed.  Patient Medicare AWV questionnaire was completed by the patient on 03/10/23; I have confirmed that all information answered by patient is correct and no changes since this date.    Vital Signs: Unable to obtain new vitals due to this being a telehealth visit.  Review of Systems     Cardiac Risk Factors include: advanced age (>91men, >79 women);male gender     Objective:    Today's Vitals   03/14/23 1303  Weight: 183 lb (83 kg)   Body mass index is 28.66 kg/m.     03/14/2023    1:07 PM 09/06/2022    8:39 AM 08/26/2022    9:15 AM 08/12/2021    8:55 AM 10/01/2020    8:31 AM 05/30/2020    8:00 PM 05/26/2020    6:18 PM  Advanced Directives  Does Patient Have a Medical Advance Directive? Yes Yes Yes Yes Yes Yes Yes  Type of Estate agent of [place];Living will Healthcare Power of [place];Living will Healthcare Power of [place];Living will Living will;Healthcare Power of [employer] Power of [place];Living will Healthcare Power of [place];Living will Healthcare Power of [place];Living will  Does patient want to make changes to medical advance directive? No - Patient declined No - Patient declined    No - Patient declined No - Patient declined  Copy of Healthcare Power of Attorney in Chart? Yes - validated most recent copy scanned in chart (See row information) No - copy requested  No - copy requested No - copy requested No - copy requested No - copy  requested  Would patient like information on creating a medical advance directive?      No - Patient declined No - Patient declined    Current Medications (verified) Outpatient Encounter Medications as of 03/14/2023  Medication Sig   acetaminophen (TYLENOL) 500 MG tablet Take 1,000 mg by mouth every 6 (six) hours as needed for moderate pain or mild pain.   Azelastine-Fluticasone 137-50 MCG/ACT SUSP Place 1 spray into both nostrils daily as needed (Congestion).   CALCIUM CITRATE PO Take 1 tablet by mouth in the morning and at bedtime.   Cholecalciferol (VITAMIN D) 2000 UNITS CAPS Take 2,000 Units by mouth daily.   clopidogrel (PLAVIX) 75 MG tablet Take 1 tablet (75 mg total) by mouth daily.   Coenzyme Q10 200 MG capsule Take 200 mg by mouth daily.   Cyanocobalamin (VITAMIN B-12) 5000 MCG SUBL Place 5,000 mcg under the tongue daily.   finasteride (PROSCAR) 5 MG tablet Take 5 mg by mouth daily.   icosapent Ethyl (VASCEPA) 1 g capsule Take 1 g by mouth 4 (four) times daily.   Multiple Vitamin (MULTIVITAMIN) capsule Take 1 capsule by mouth daily.   rosuvastatin (CRESTOR) 40 MG tablet Take 40 mg by mouth daily.   SYMBICORT 80-4.5 MCG/ACT inhaler Inhale 2 puffs into the lungs 2 (two) times daily as needed (shortness of breath).   tadalafil (CIALIS) 5 MG tablet Take 5 mg by mouth daily.   No facility-administered encounter  medications on file as of 03/14/2023.    Allergies (verified) Ciprofloxacin hcl and Quinolones   History: Past Medical History:  Diagnosis Date   Allergy    Anginal pain (HCC)    Arthritis    BPH (benign prostatic hypertrophy) 12/2005   CAD (coronary artery disease) 2010   dx made by coronary calcium scoring, Normal stress test in 2010.    GERD (gastroesophageal reflux disease) 06/2004   HLD (hyperlipidemia) 09/1997   HTN (hypertension) 2002   no meds   Left rotator cuff tear 08/12/2011   Per Dr. Dion Saucier 2013 L rotator cuff tear on MRI 2013    Right rotator cuff tear  05/24/2013   Wears glasses    Past Surgical History:  Procedure Laterality Date   admitted to [facility] chest pain  12/15/05   Dr. Juliann Pares   BLEPHAROPLASTY  10/11   Dr. Kemper Durie    bone spur removed Right    COLONOSCOPY  09/24/04   nml; Dr. Sharen Hint    CORONARY ARTERY BYPASS GRAFT N/A 05/29/2020   Procedure: CORONARY ARTERY BYPASS GRAFTING (CABG) TIMES THREE USING LIMA to LAD; ENDOSCOPIC HARVESTED RIGHT GREATER SAPHENOUS VEIN: SVG to PD; SVG to RAMUS.;  Surgeon: Delight Ovens, MD;  Location: [facility] OR;  Service: Open Heart Surgery;  Laterality: N/A;   EGD esophagitis by bx  01/26/06   gastritis bx neg h-pylori   ENDOVEIN HARVEST OF GREATER SAPHENOUS VEIN Right 05/29/2020   Procedure: ENDOVEIN HARVEST OF GREATER SAPHENOUS VEIN;  Surgeon: Delight Ovens, MD;  Location: [facility] OR;  Service: Open Heart Surgery;  Laterality: Right;   LEFT HEART CATH AND CORONARY ANGIOGRAPHY N/A 05/26/2020   Procedure: LEFT HEART CATH AND CORONARY ANGIOGRAPHY;  Surgeon: Lamar Blinks, MD;  Location: [facility] INVASIVE CV LAB;  Service: Cardiovascular;  Laterality: N/A;   LOWER EXTREMITY ANGIOGRAPHY Right 10/01/2020   Procedure: LOWER EXTREMITY ANGIOGRAPHY;  Surgeon: Annice Needy, MD;  Location: [facility] INVASIVE CV LAB;  Service: Cardiovascular;  Laterality: Right;   LOWER EXTREMITY ANGIOGRAPHY Right 08/12/2021   Procedure: LOWER EXTREMITY ANGIOGRAPHY;  Surgeon: Annice Needy, MD;  Location: [facility] INVASIVE CV LAB;  Service: Cardiovascular;  Laterality: Right;   LOWER EXTREMITY ANGIOGRAPHY Right 08/23/2021   Procedure: LOWER EXTREMITY ANGIOGRAPHY;  Surgeon: Annice Needy, MD;  Location: [facility] INVASIVE CV LAB;  Service: Cardiovascular;  Laterality: Right;   PARTIAL KNEE ARTHROPLASTY Left 09/06/2022   Procedure: UNICOMPARTMENTAL KNEE;  Surgeon: Teryl Lucy, MD;  Location: [facility] ORS;  Service: Orthopedics;  Laterality: Left;   POLYPECTOMY     right and left prostate needle bx  05/02/03   acute inflammation; Dr. Patsi Sears    SHOULDER  ARTHROSCOPY W/ ROTATOR CUFF REPAIR  12/2011   left   SHOULDER ARTHROSCOPY WITH ROTATOR CUFF REPAIR AND SUBACROMIAL DECOMPRESSION Right 05/24/2013   Procedure: RIGHT SHOULDER ARTHROSCOPY WITH ROTATOR CUFF REPAIR AND SUBACROMIAL DECOMPRESSION AND PARTIAL ACROMIOPLSTY WITH CORACOACROMIAL RELEASE, DISTAL CLAVICULECTOMY, LABRIAL DEBRIDEMENT;  Surgeon: Eulas Post, MD;  Location: [facility];  Service: Orthopedics;  Laterality: Right;   spect ETT nml  12/16/05   TEE WITHOUT CARDIOVERSION N/A 05/29/2020   Procedure: TRANSESOPHAGEAL ECHOCARDIOGRAM (TEE);  Surgeon: Delight Ovens, MD;  Location[facility] OR;  Service: Open Heart Surgery;  Laterality: N/A;   UPPER GASTROINTESTINAL ENDOSCOPY     WISDOM TOOTH EXTRACTION     Family History  Problem Relation Age of Onset   Stroke Mother    Glaucoma Other        grandmother  at 80   Barrett's esophagus Maternal Uncle    Prostate cancer Neg Hx    Colon cancer Neg Hx    Colon polyps Neg Hx    Esophageal cancer Neg Hx    Rectal cancer Neg Hx    Stomach cancer Neg Hx    Social History   Socioeconomic History   Marital status: Married    Spouse name: Junious Dresser   Number of children: 2   Years of education: Not on file   Highest education level: Doctorate  Occupational History   Not on file  Tobacco Use   Smoking status: Never   Smokeless tobacco: Never  Vaping Use   Vaping status: Never Used  Substance and Sexual Activity   Alcohol use: Yes    Alcohol/week: 0.0 standard drinks of alcohol    Comment: beer, occ   Drug use: No   Sexual activity: Yes  Other Topics Concern   Not on file  Social History Narrative   Married, 2 children - one is local   Married 40+ years   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    Plays guitar   Works out regularly.   Social Determinants of Health   Financial Resource Strain: Low Risk  (03/10/2023)   Overall Financial Resource Strain (CARDIA)    Difficulty of Paying Living Expenses: Not  hard at all  Food Insecurity: Low Risk  (03/13/2023)   Received from [facility]   Food vital sign    Within the past 12 months, you worried that your food would run out before you got money to buy more: Never true    Within the past 12 months, the food you bought just didn't last and you didn't have money to get more. : Never true  Transportation Needs: Not on file (03/13/2023)  Physical Activity: Sufficiently Active (03/10/2023)   Exercise Vital Sign    Days of Exercise per Week: 6 days    Minutes of Exercise per Session: 60 min  Stress: No Stress Concern Present (03/10/2023)   [employer] of Occupational Health - Occupational Stress Questionnaire    Feeling of Stress : Not at all  Recent Concern: Stress - Stress Concern Present (01/04/2023)   [employer] of Occupational Health - Occupational Stress Questionnaire    Feeling of Stress : To some extent  Social Connections: Socially Integrated (03/10/2023)   Social Connection and Isolation Panel [NHANES]    Frequency of Communication with Friends and Family: More than three times a week    Frequency of Social Gatherings with Friends and Family: More than three times a week    Attends Religious Services: 1 to 4 times per year    Active Member of [employer] or Organizations: Yes    Attends Engineer, structural: More than 4 times per year    Marital Status: Married    Tobacco Counseling Counseling given: Not Answered   Clinical Intake:  Pre-visit preparation completed: Yes  Pain : No/denies pain     BMI - recorded: 28.66 Nutritional Status: BMI 25 -29 Overweight Nutritional Risks: None Diabetes: No  How often do you need to have someone help you when you read instructions, pamphlets, or other written materials from your doctor or pharmacy?: 1 - Never  Interpreter Needed?: No  Information entered by :: Lanier Ensign, LPN   Activities of Daily Living    03/10/2023    2:55 PM 08/26/2022    9:18 AM  In your  present state of  health, do you have any difficulty performing the following activities:  Hearing? 0   Vision? 0   Difficulty concentrating or making decisions? 0   Walking or climbing stairs? 0   Dressing or bathing? 0   Doing errands, shopping? 0 0  Preparing Food and eating ? N   Using the Toilet? N   In the past six months, have you accidently leaked urine? N   Do you have problems with loss of bowel control? N   Managing your Medications? N   Managing your Finances? N   Housekeeping or managing your Housekeeping? N     Patient Care Team: Joaquim Nam, MD as PCP - General (Family Medicine) Lamar Blinks, MD as PCP - Cardiology (Cardiology)  Indicate any recent Medical Services you may have received from other than [facility] providers in the past year (date may be approximate).     Assessment:   This is a routine wellness examination for [place].  Hearing/Vision screen Hearing Screening - Comments:: Pt denies any hearing issues  Vision Screening - Comments:: Pt follows up with Dr Dione Booze for annual eye exams   Dietary issues and exercise activities discussed:     Goals Addressed             This Visit's Progress    Patient Stated       Stay healthy and  active        Depression Screen    03/14/2023    1:08 PM 01/05/2023    3:15 PM 09/07/2020    5:22 PM 06/30/2020    2:51 PM 04/11/2017    4:16 PM  PHQ 2/9 Scores  PHQ - 2 Score 0 0 0 0 0  PHQ- 9 Score  0 2 0     Fall Risk    03/10/2023    2:55 PM 01/05/2023    2:52 PM 06/29/2020    2:06 PM 04/11/2017    4:16 PM  Fall Risk   Falls in the past year? 0 0 0 No  Number falls in past yr: 0 0    Injury with Fall? 0 0    Risk for fall due to : Impaired vision No Fall Risks    Follow up Falls prevention discussed Falls evaluation completed      MEDICARE RISK AT HOME: Medicare Risk at Home Any stairs in or around the home?: Yes If so, are there any without handrails?: No Home free of loose throw rugs in  walkways, pet beds, electrical cords, etc?: Yes Adequate lighting in your home to reduce risk of falls?: Yes Life alert?: No Use of a cane, walker or w/c?: No Grab bars in the bathroom?: Yes Shower chair or bench in shower?: No Elevated toilet seat or a handicapped toilet?: Yes  TIMED UP AND GO:  Was the test performed? No    Cognitive Function:        03/14/2023    1:09 PM  6CIT Screen  What Year? 0 points  What month? 0 points  What time? 0 points  Count back from 20 0 points  Months in reverse 0 points  Repeat phrase 0 points  Total Score 0 points    Immunizations Immunization History  Administered Date(s) Administered   COVID-19, mRNA, vaccine(Comirnaty)12 years and older 06/27/2022   Influenza,inj,Quad PF,6+ Mos 07/21/2022   Influenza-Unspecified 04/24/2014, 05/18/2021   PFIZER(Purple Top)SARS-COV-2 Vaccination 09/06/2019, 10/01/2019, 05/14/2020, 11/15/2020   Pfizer Covid-19 Vaccine Bivalent Booster 12yrs & up 07/03/2021  Pneumococcal Conjugate-13 10/24/2014   Pneumococcal Polysaccharide-23 08/11/2011   Td 01/21/1999, 02/09/2009   Zoster, Live 02/09/2009    TDAP status: Due, Education has been provided regarding the importance of this vaccine. Advised may receive this vaccine at local pharmacy or Health Dept. Aware to provide a copy of the vaccination record if obtained from local pharmacy or Health Dept. Verbalized acceptance and understanding.  Flu Vaccine status: Due, Education has been provided regarding the importance of this vaccine. Advised may receive this vaccine at local pharmacy or Health Dept. Aware to provide a copy of the vaccination record if obtained from local pharmacy or Health Dept. Verbalized acceptance and understanding.  Pneumococcal vaccine status: Up to date  Covid-19 vaccine status: Information provided on how to obtain vaccines.   Qualifies for Shingles Vaccine? Yes   Zostavax completed Yes   Shingrix Completed?: No.    Education has  been provided regarding the importance of this vaccine. Patient has been advised to call insurance company to determine out of pocket expense if they have not yet received this vaccine. Advised may also receive vaccine at local pharmacy or Health Dept. Verbalized acceptance and understanding.  Screening Tests Health Maintenance  Topic Date Due   Hepatitis C Screening  Never done   Zoster Vaccines- Shingrix (1 of 2) 06/23/1996   DTaP/Tdap/Td (3 - Tdap) 02/10/2019   COVID-19 Vaccine (7 - 2023-24 season) 10/27/2022   INFLUENZA VACCINE  02/23/2023   Medicare Annual Wellness (AWV)  03/13/2024   Colonoscopy  01/19/2025   Pneumonia Vaccine 70+ Years old  Completed   HPV VACCINES  Aged Out    Health Maintenance  Health Maintenance Due  Topic Date Due   Hepatitis C Screening  Never done   Zoster Vaccines- Shingrix (1 of 2) 06/23/1996   DTaP/Tdap/Td (3 - Tdap) 02/10/2019   COVID-19 Vaccine (7 - 2023-24 season) 10/27/2022   INFLUENZA VACCINE  02/23/2023    Colorectal cancer screening: Type of screening: Colonoscopy. Completed 01/20/20. Repeat every 5 years   Additional Screening:  Hepatitis C Screening: does qualify  Vision Screening: Recommended annual ophthalmology exams for early detection of glaucoma and other disorders of the eye. Is the patient up to date with their annual eye exam?  Yes  Who is the provider or what is the name of the office in which the patient attends annual eye exams? Dr Dione Booze  If pt is not established with a provider, would they like to be referred to a provider to establish care? No .   Dental Screening: Recommended annual dental exams for proper oral hygiene    Community Resource Referral / Chronic Care Management: CRR required this visit?  No   CCM required this visit?  No    Plan:     I have personally reviewed and noted the following in the patient's chart:   Medical and social history Use of alcohol, tobacco or illicit drugs  Current  medications and supplements including opioid prescriptions. Patient is not currently taking opioid prescriptions. Functional ability and status Nutritional status Physical activity Advanced directives List of other physicians Hospitalizations, surgeries, and ER visits in previous 12 months Vitals Screenings to include cognitive, depression, and falls Referrals and appointments  In addition, I have reviewed and discussed with patient certain preventive protocols, quality metrics, and best practice recommendations. A written personalized care plan for preventive services as well as general preventive health recommendations were provided to patient.     Marzella Schlein, LPN   1/32/4401  After Visit Summary: (MyChart) Due to this being a telephonic visit, the after visit summary with patients personalized plan was offered to patient via MyChart   Nurse Notes: none

## 2023-03-15 NOTE — Patient Instructions (Signed)
SURGICAL WAITING ROOM VISITATION  Patients having surgery or a procedure may have no more than 2 support people in the waiting area - these visitors may rotate.    Children under the age of 77 must have an adult with them who is not the patient.  Due to an increase in RSV and influenza rates and associated hospitalizations, children ages 39 and under may not visit patients in [facility] hospitals.  If the patient needs to stay at the hospital during part of their recovery, the visitor guidelines for inpatient rooms apply. Pre-op nurse will coordinate an appropriate time for 1 support person to accompany patient in pre-op.  This support person may not rotate.    Please refer to the [facility] website for the visitor guidelines for Inpatients (after your surgery is over and you are in a regular room).       Your procedure is scheduled on:  04/04/2023    Report to [facility] Main Entrance    Report to admitting at   0730AM   Call this number if you have problems the morning of surgery [phone]   Do not eat food : or drink liquids After Midnight.                     If you have questions, please contact your surgeon's office.       Oral Hygiene is also important to reduce your risk of infection.                                    Remember - BRUSH YOUR TEETH THE MORNING OF SURGERY WITH YOUR REGULAR TOOTHPASTE  DENTURES WILL BE REMOVED PRIOR TO SURGERY PLEASE DO NOT APPLY "Poly grip" OR ADHESIVES!!!   Do NOT smoke after Midnight   Stop all vitamins and herbal supplements 7 days before surgery.   Take these medicines the morning of surgery with A SIP OF WATER:  oriscarm vasceoam Inhalers as usual and bring   DO NOT TAKE ANY ORAL DIABETIC MEDICATIONS DAY OF YOUR SURGERY  Bring CPAP mask and tubing day of surgery.                              You may not have any metal on your body including hair pins, jewelry, and body piercing             Do not wear  make-up, lotions, powders, perfumes/cologne, or deodorant  Do not wear nail polish including gel and S&S, artificial/acrylic nails, or any other type of covering on natural nails including finger and toenails. If you have artificial nails, gel coating, etc. that needs to be removed by a nail salon please have this removed prior to surgery or surgery may need to be canceled/ delayed if the surgeon/ anesthesia feels like they are unable to be safely monitored.   Do not shave  48 hours prior to surgery.               Men may shave face and neck.   Do not bring valuables to the hospital. [facility] IS NOT             RESPONSIBLE   FOR VALUABLES.   Contacts, glasses, dentures or bridgework may not be worn into surgery.   Bring small overnight bag day of surgery.  DO NOT BRING YOUR HOME MEDICATIONS TO THE HOSPITAL. PHARMACY WILL DISPENSE MEDICATIONS LISTED ON YOUR MEDICATION LIST TO YOU DURING YOUR ADMISSION IN THE HOSPITAL!    Patients discharged on the day of surgery will not be allowed to drive home.  Someone NEEDS to stay with you for the first 24 hours after anesthesia.   Special Instructions: Bring a copy of your healthcare power of attorney and living will documents the day of surgery if you haven't scanned them before.              Please read over the following fact sheets you were given: IF YOU HAVE QUESTIONS ABOUT YOUR PRE-OP INSTRUCTIONS PLEASE CALL [phone]   If you received a COVID test during your pre-op visit  it is requested that you wear a mask when out in public, stay away from anyone that may not be feeling well and notify your surgeon if you develop symptoms. If you test positive for Covid or have been in contact with anyone that has tested positive in the last 10 days please notify you surgeon.    [place] - Preparing for Surgery Before surgery, you can play an important role.  Because skin is not sterile, your skin needs to be as free of germs as possible.  You  can reduce the number of germs on your skin by washing with CHG (chlorahexidine gluconate) soap before surgery.  CHG is an antiseptic cleaner which kills germs and bonds with the skin to continue killing germs even after washing. Please DO NOT use if you have an allergy to CHG or antibacterial soaps.  If your skin becomes reddened/irritated stop using the CHG and inform your nurse when you arrive at Short Stay. Do not shave (including legs and underarms) for at least 48 hours prior to the first CHG shower.  You may shave your face/neck. Please follow these instructions carefully:  1.  Shower with CHG Soap the night before surgery and the  morning of Surgery.  2.  If you choose to wash your hair, wash your hair first as usual with your  normal  shampoo.  3.  After you shampoo, rinse your hair and body thoroughly to remove the  shampoo.                           4.  Use CHG as you would any other liquid soap.  You can apply chg directly  to the skin and wash                       Gently with a scrungie or clean washcloth.  5.  Apply the CHG Soap to your body ONLY FROM THE NECK DOWN.   Do not use on face/ open                           Wound or open sores. Avoid contact with eyes, ears mouth and genitals (private parts).                       Wash face,  Genitals (private parts) with your normal soap.             6.  Wash thoroughly, paying special attention to the area where your surgery  will be performed.  7.  Thoroughly rinse your body with warm water from the neck down.  8.  DO NOT shower/wash with your normal soap after using and rinsing off  the CHG Soap.                9.  Pat yourself dry with a clean towel.            10.  Wear clean pajamas.            11.  Place clean sheets on your bed the night of your first shower and do not  sleep with pets. Day of Surgery : Do not apply any lotions/deodorants the morning of surgery.  Please wear clean clothes to the hospital/surgery center.  FAILURE  TO FOLLOW THESE INSTRUCTIONS MAY RESULT IN THE CANCELLATION OF YOUR SURGERY PATIENT SIGNATURE_________________________________  NURSE SIGNATURE__________________________________  ________________________________________________________________________

## 2023-03-17 ENCOUNTER — Encounter (HOSPITAL_COMMUNITY): Payer: Self-pay

## 2023-03-20 ENCOUNTER — Encounter (HOSPITAL_COMMUNITY): Payer: Self-pay

## 2023-03-20 ENCOUNTER — Other Ambulatory Visit: Payer: Self-pay

## 2023-03-20 ENCOUNTER — Encounter (HOSPITAL_COMMUNITY): Admission: RE | Admit: 2023-03-20 | Payer: Medicare Other | Source: Ambulatory Visit

## 2023-03-20 VITALS — BP 163/93 | HR 69 | Temp 98.2°F | Resp 16 | Ht 67.0 in | Wt 184.0 lb

## 2023-03-20 DIAGNOSIS — Z01812 Encounter for preprocedural laboratory examination: Secondary | ICD-10-CM | POA: Diagnosis present

## 2023-03-20 DIAGNOSIS — Z01818 Encounter for other preprocedural examination: Secondary | ICD-10-CM

## 2023-03-20 HISTORY — DX: Peripheral vascular disease, unspecified: I73.9

## 2023-03-20 MED ORDER — REGADENOSON 0.4 MG/5ML IV SOLN
0.4000 mg | Freq: Once | INTRAVENOUS | Status: AC
  Administered 2023-03-21: 0.4 mg via INTRAVENOUS

## 2023-03-21 ENCOUNTER — Encounter (HOSPITAL_COMMUNITY)
Admission: RE | Admit: 2023-03-21 | Discharge: 2023-03-21 | Disposition: A | Discharge status: Home / Self Care | Payer: Medicare Other | Source: Ambulatory Visit | Attending: Family | Admitting: Family

## 2023-03-21 DIAGNOSIS — I2510 Atherosclerotic heart disease of native coronary artery without angina pectoris: Secondary | ICD-10-CM | POA: Insufficient documentation

## 2023-03-21 DIAGNOSIS — I10 Essential (primary) hypertension: Secondary | ICD-10-CM | POA: Diagnosis present

## 2023-03-21 DIAGNOSIS — Z951 Presence of aortocoronary bypass graft: Secondary | ICD-10-CM | POA: Diagnosis present

## 2023-03-21 LAB — NM PET CT CARDIAC PERFUSION MULTI W/ABSOLUTE BLOODFLOW
LV dias vol: 62 mL (ref 62–150)
LV sys vol: 32 mL
MBFR: 1.71
Nuc Rest EF: 45 %
Nuc Stress EF: 48 %
Rest MBF: 0.93 ml/g/min
Rest Nuclear Isotope Dose: 21.7 mCi
ST Depression (mm): 0 mm
Stress MBF: 1.59 ml/g/min
Stress Nuclear Isotope Dose: 21.7 mCi
TID: 1.08

## 2023-03-21 MED ORDER — RUBIDIUM RB82 GENERATOR (RUBYFILL)
21.7200 | PACK | Freq: Once | INTRAVENOUS | Status: AC
  Administered 2023-03-21: 21.72 via INTRAVENOUS

## 2023-03-21 MED ORDER — RUBIDIUM RB82 GENERATOR (RUBYFILL)
21.7100 | PACK | Freq: Once | INTRAVENOUS | Status: AC
  Administered 2023-03-21: 21.71 via INTRAVENOUS

## 2023-03-22 ENCOUNTER — Other Ambulatory Visit (HOSPITAL_COMMUNITY): Payer: Self-pay

## 2023-03-22 ENCOUNTER — Telehealth: Payer: Self-pay

## 2023-03-22 ENCOUNTER — Telehealth (HOSPITAL_BASED_OUTPATIENT_CLINIC_OR_DEPARTMENT_OTHER): Payer: Self-pay

## 2023-03-22 DIAGNOSIS — I10 Essential (primary) hypertension: Secondary | ICD-10-CM

## 2023-03-22 MED ORDER — DAPAGLIFLOZIN PROPANEDIOL 10 MG PO TABS: 10.0000 mg | ORAL_TABLET | Freq: Every day | ORAL | 3 refills | Status: DC

## 2023-03-22 NOTE — Telephone Encounter (Addendum)
Results called to patient who verbalizes understanding! Rx ordered and sent w/ coupon information. Coupon also mailed to patient with lab slips.   Will route encounter to RX PA team for prior auth.    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 Ejection fraction 45% with normal being 50-65%. There was mild ischemia (mildly reduced blood flow in one area of the heart). However, as no chest pain nor dyspnea in recent clinic visit this is less concerning.   Given mildly reduced heart muscle function, recommend optimization of guideline directed medical therapy. Recommend initiation of Farxiga 10mg  daily. Can mail free 30-day coupon or add to Rx prior to sending to pharmacy. Repeat BMP 1-2 weeks after starting.  May wait until after his upcoming procedure 04/04/23 to start new medication if he wishes.   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

## 2023-03-22 NOTE — Progress Notes (Signed)
Anesthesia Chart Review   Case: [id number] Date/Time: 04/04/23 0915   Procedure: TRANSURETHRAL WATERJET ABLATION OF PROSTATE - 90 MINS FOR CASE   Anesthesia type: General   Pre-op diagnosis: BENIGN PROSTATE HYPERPLASIA   Location: [facility] PROC[facility] / [facility]   Surgeons: Jerilee Field, MD       DISCUSSION:77 y.o. 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 Jerilee Field.   Pt last seen by cardiology 03/03/2023. Per OV note, "Preop clearance - Pending transurethral water jet ablation of prostate. According to the Revised Cardiac Risk Index (RCRI), his Perioperative Risk of Major Cardiac Event is (%): 6.6. His Functional Capacity in METs is: 7.34 according to the Duke Activity Status Index (DASI). Per AHA/ACC guidelines, he is deemed acceptable risk for the planned procedure without additional cardiovascular testing. Will route to surgical team so they are aware.  May hold Plavix 5 days prior to planned procedure. "  Pt advised to hold Plavix 5 days prior to procedure.  VS: There were no vitals taken for this visit.  PROVIDERS: Joaquim Nam, MD is PCP   Cardiologist : Zoila Shutter, MD  LABS:  labs DOS (all labs ordered are listed, but only abnormal results are displayed)  Labs Reviewed - No data to display   IMAGES:   EKG:   CV: Echo 05/26/20 1. Left ventricular ejection fraction, by estimation, is 60 to 65%. The  left ventricle has normal function. The left ventricle has no regional  wall motion abnormalities. Left ventricular diastolic parameters were  normal.   2. Right ventricular systolic function is normal. The right ventricular  size is normal.   3. The mitral valve is normal in structure. Trivial mitral valve  regurgitation.   4. The aortic valve is normal in structure. Aortic valve regurgitation is  not visualized.  Past Medical History:  Diagnosis Date   Allergy    Anginal pain (HCC)     Arthritis    BPH (benign prostatic hypertrophy) 12/2005   CAD (coronary artery disease) 2010   dx made by coronary calcium scoring, Normal stress test in 2010.    GERD (gastroesophageal reflux disease) 06/2004   HLD (hyperlipidemia) 09/1997   Left rotator cuff tear 08/12/2011   Per Dr. Dion Saucier 2013 L rotator cuff tear on MRI 2013    Peripheral vascular disease (HCC)    Right rotator cuff tear 05/24/2013   Wears glasses     Past Surgical History:  Procedure Laterality Date   admitted to [facility] chest pain  12/15/05   Dr. Juliann Pares   BLEPHAROPLASTY  10/11   Dr. Kemper Durie    bone spur removed Right    COLONOSCOPY  09/24/04   nml; Dr. Sharen Hint    CORONARY ARTERY BYPASS GRAFT N/A 05/29/2020   Procedure: CORONARY ARTERY BYPASS GRAFTING (CABG) TIMES THREE USING LIMA to LAD; ENDOSCOPIC HARVESTED RIGHT GREATER SAPHENOUS VEIN: SVG to PD; SVG to RAMUS.;  Surgeon: Delight Ovens, MD;  Location: [facility] OR;  Service: Open Heart Surgery;  Laterality: N/A;   EGD esophagitis by bx  01/26/06   gastritis bx neg h-pylori   ENDOVEIN HARVEST OF GREATER SAPHENOUS VEIN Right 05/29/2020   Procedure: ENDOVEIN HARVEST OF GREATER SAPHENOUS VEIN;  Surgeon: Delight Ovens, MD;  Location: [facility] OR;  Service: Open Heart Surgery;  Laterality: Right;   LEFT HEART CATH AND CORONARY ANGIOGRAPHY N/A 05/26/2020   Procedure: LEFT HEART CATH AND CORONARY ANGIOGRAPHY;  Surgeon: Arnoldo Hooker  J, MD;  Location: [facility] INVASIVE CV LAB;  Service: Cardiovascular;  Laterality: N/A;   LOWER EXTREMITY ANGIOGRAPHY Right 10/01/2020   Procedure: LOWER EXTREMITY ANGIOGRAPHY;  Surgeon: Annice Needy, MD;  Location: [facility] INVASIVE CV LAB;  Service: Cardiovascular;  Laterality: Right;   LOWER EXTREMITY ANGIOGRAPHY Right 08/12/2021   Procedure: LOWER EXTREMITY ANGIOGRAPHY;  Surgeon: Annice Needy, MD;  Location: [facility] INVASIVE CV LAB;  Service: Cardiovascular;  Laterality: Right;   LOWER EXTREMITY ANGIOGRAPHY Right 08/23/2021   Procedure: LOWER EXTREMITY  ANGIOGRAPHY;  Surgeon: Annice Needy, MD;  Location: [facility] INVASIVE CV LAB;  Service: Cardiovascular;  Laterality: Right;   PARTIAL KNEE ARTHROPLASTY Left 09/06/2022   Procedure: UNICOMPARTMENTAL KNEE;  Surgeon: Teryl Lucy, MD;  Location: [facility];  Service: Orthopedics;  Laterality: Left;   POLYPECTOMY     right and left prostate needle bx  05/02/03   acute inflammation; Dr. Patsi Sears    SHOULDER ARTHROSCOPY W/ ROTATOR CUFF REPAIR  12/2011   left   SHOULDER ARTHROSCOPY WITH ROTATOR CUFF REPAIR AND SUBACROMIAL DECOMPRESSION Right 05/24/2013   Procedure: RIGHT SHOULDER ARTHROSCOPY WITH ROTATOR CUFF REPAIR AND SUBACROMIAL DECOMPRESSION AND PARTIAL ACROMIOPLSTY WITH CORACOACROMIAL RELEASE, DISTAL CLAVICULECTOMY, LABRIAL DEBRIDEMENT;  Surgeon: Eulas Post, MD;  Location: [facility];  Service: Orthopedics;  Laterality: Right;   spect ETT nml  12/16/05   TEE WITHOUT CARDIOVERSION N/A 05/29/2020   Procedure: TRANSESOPHAGEAL ECHOCARDIOGRAM (TEE);  Surgeon: Delight Ovens, MD;  Location: [facility] OR;  Service: Open Heart Surgery;  Laterality: N/A;   UPPER GASTROINTESTINAL ENDOSCOPY     WISDOM TOOTH EXTRACTION      MEDICATIONS: No current facility-administered medications for this encounter.    acetaminophen (TYLENOL) 500 MG tablet   Azelastine-Fluticasone 137-50 MCG/ACT SUSP   CALCIUM CITRATE PO   Cholecalciferol (VITAMIN D) 2000 UNITS CAPS   clopidogrel (PLAVIX) 75 MG tablet   Coenzyme Q10 200 MG capsule   Cyanocobalamin (VITAMIN B-12) 5000 MCG SUBL   finasteride (PROSCAR) 5 MG tablet   icosapent Ethyl (VASCEPA) 1 g capsule   Multiple Vitamin (MULTIVITAMIN) capsule   rosuvastatin (CRESTOR) 40 MG tablet   SYMBICORT 80-4.5 MCG/ACT inhaler   tadalafil (CIALIS) 5 MG tablet   dapagliflozin propanediol (FARXIGA) 10 MG TABS tablet   [facility], PA-C [facility] Pre-Surgical Testing [phone]

## 2023-03-22 NOTE — Anesthesia Preprocedure Evaluation (Addendum)
Anesthesia Evaluation  Patient identified by MRN, date of birth, ID band Patient awake    Reviewed: Allergy & Precautions, NPO status , Patient's Chart, lab work & pertinent test results, reviewed documented beta blocker date and time   History of Anesthesia Complications Negative for: history of anesthetic complications  Airway Mallampati: II       Dental  (+) Caps, Dental Advisory Given   Pulmonary neg pulmonary ROS   Pulmonary exam normal breath sounds clear to auscultation       Cardiovascular hypertension, Pt. on medications + angina  + CAD, + CABG and + Peripheral Vascular Disease  Normal cardiovascular exam Rhythm:Regular Rate:Normal  CABG x 3 05/29/20   Neuro/Psych negative neurological ROS  negative psych ROS   GI/Hepatic Neg liver ROS,GERD  Medicated,,  Endo/Other  Hyperlipidemia  Renal/GU negative Renal ROS   BPH negative genitourinary   Musculoskeletal  (+) Arthritis ,    Abdominal   Peds  Hematology  (+) Blood dyscrasia Plavix therapy   Anesthesia Other Findings   Reproductive/Obstetrics ED                              Anesthesia Physical Anesthesia Plan  ASA: 3  Anesthesia Plan: General   Post-op Pain Management: Dilaudid IV   Induction: Intravenous  PONV Risk Score and Plan: 4 or greater and Ondansetron and Dexamethasone  Airway Management Planned: Oral ETT  Additional Equipment: None  Intra-op Plan:   Post-operative Plan: Extubation in OR  Informed Consent: I have reviewed the patients History and Physical, chart, labs and discussed the procedure including the risks, benefits and alternatives for the proposed anesthesia with the patient or authorized representative who has indicated his/her understanding and acceptance.     Dental advisory given  Plan Discussed with: CRNA and Anesthesiologist  Anesthesia Plan Comments: (See PAT note 03/22/2023)         Anesthesia Quick Evaluation

## 2023-03-22 NOTE — Telephone Encounter (Signed)
Pharmacy Patient Advocate Encounter  Insurance verification completed.    The patient is insured through [employer]   Ran test claim for [place]. Currently a quantity of 30 is a 30 day supply and the co-pay is 47.00$ . (NO P/A IS NEEDED AT THIS TIME)     This test claim was processed through [facility]- copay amounts may vary at other pharmacies due to pharmacy/plan contracts, or as the patient moves through the different stages of their insurance plan.

## 2023-04-03 ENCOUNTER — Encounter (HOSPITAL_COMMUNITY): Payer: Self-pay | Admitting: Urology

## 2023-04-03 NOTE — H&P (Signed)
Office Visit Report     03/31/2023   --------------------------------------------------------------------------------   Clayton Powell  MRN: [id number]  DOB: 1946-03-12, 77 year old Male  SSN:    PRIMARY CARE:  Binnie Rail, MD  PRIMARY CARE FAX:  [phone]  REFERRING:  Doyne Keel, MD  PROVIDER:  Jerilee Field, M.D.  TREATING:  Anne Fu, NP  LOCATION:  [facility] [phone]     --------------------------------------------------------------------------------   CC/HPI: F/u -   1) BPH, LUTS- long h/o BPH. Saw [facility] Dr. Achilles Dunk and then Dr. Lonna Cobb. Dr. Lonna Cobb referred to Dr. Richardo Hanks for HoLEP and pt also consider PAE. Prostate 120 grams (6.7x5x6.8) on [place] and cysto with lateral lobes in 2021 with Dr. Lonna Cobb. Pt on finasteride since 2013 retention episode. Also on alfuzosin and tadalafil. AUASS = 19.   He underwent Rezum Nov 2022 x 8. Bladder scan 192 ml but he did void afterward. Voiding much better. Less urgency. Noc x 0-1. Much improved. He continues finasteride and tadalafil. PVr 7 ml. Having noc x 3. AUASS = 10. Daytime freq. No incon. No urge. Could live with it, but wants some improvement. He is OFF finasteride and alfuzosin. Taking tadalafil.   2) PSA elevation - his PSA was 4 (8) on 5ari with [facility] May 2021. Aug 2022 PSA was 3.2 (6.4). PSA dropped to 2 in 2023.   Today, seen for the above. Prostate [place] with 143 g prostate. Cysto with lateral lobe obstruction.  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   03/31/2023: Patient here today for preoperative evaluation prior to undergoing aqua ablation with Dr. Mena Goes on 9/10. Started finasteride at last visit. No longer on any type of alpha-blocker therapy or tadalafil. Voiding symptoms grossly stable consistent with baseline, no interval dysuria or gross hematuria since last exam. No interval treatment for UTI. He denies any other changes in past medical history, prescription medications taken on  daily basis, no interval surgical or procedural intervention. He denies any chest pain, shortness of breath, lightheadedness or dizziness.     ALLERGIES: Cipro - Skin Rash Quinolones    MEDICATIONS: Finasteride 5 mg tablet 1 tablet PO Daily  Plavix 75 mg tablet  Tadalafil 5 mg tablet 1 tablet PO PRN  Calcium Citrate  Clopidogrel 75 mg tablet 1 tablet PO Daily  Dymista  Multivitamin  Rosuvastatin Calcium 10 mg tablet  Tylenol 1 PO Daily  Vascepa 1 gram capsule 1 capsule PO Daily  Vitamin B12  Vitamin D3     GU PSH: Cystoscopy - 01/30/2023 Trurl Dstrj Prst8 Tiss Rf Wv - 06/07/2021       PSH Notes: CABG- 2021, leg stent- 2022   NON-GU PSH: Knee replacement, Left, partial Visit Complexity (formerly GPC1X) - 10/26/2022     GU PMH: BPH w/LUTS, We discussed the nature r/b of surveillance, alpha blocker, 5ari, daily pde5i, OAB meds and procedures such as TURP, OBD, LVP, LEP, RWJ, RSLP in the OR or PUL or WVT in the office. I again emphasized he does not want to be on long-term medicines. Given his prostate size we went over the nature risk and benefits of PA EE, robotic assisted lap simple, laser nucleation as well as aqua ablation. All questions answered. He will proceed with aqua ablation. We discussed the nature r/b/a to aquablation of prostate including side effects of the procedure, expected post-op course and likelihood of success. We discussed flow symptoms and irritative symptoms typically improve, but frequency and  urgency can persist and rarely worsen. We also discussed risk of bleeding, infection, stricture, sexual dysfunction and incontinence among others. We also discussed expectations for a staged or repeat procedure in the future. All questions answered. He elects to proceed. Will plan to have him start finasteride about 6 weeks out. - 01/30/2023, check pUS and cysto and consider another procedure , - 10/26/2022, - 08/16/2022, Cont daily tadalafil. He;s done well after Rezume and can  stop finasteride. , - 10/13/2021, - 07/13/2021, - 06/15/2021, - 06/07/2021, We discussed the nature r/b of surveillance, alpha blocker, 5ari, daily pdei, OAB meds and procedures such as TURP, LVP, LEP in the OR or PUL or WVT in the office. [facility] has experience with HoLEP and PAE and I offered to refer him back there. He will proceed with Rezume depending on PSA results and if we need a prostate MRI. We discussed RGE, bleeding, infection, expectations for success with REZUME among others. , - 2022 Weak Urinary Stream - 01/30/2023, - 10/26/2022, - 08/16/2022, - 10/13/2021, - 07/13/2021, - 06/15/2021, - 06/07/2021, - 2022 Elevated PSA, check PSA and he can stop 5ari. - 10/13/2021, We discussed the nature, risks and benefits of PSA screening as well as the nature of elevated PSA (benign versus malignant). We discussed the possibility of prostate cancer and that as the PSA rises above the level of 2.5 and even over 1, the risk of prostate cancer on biopsy increases. We discussed the management of prostate cancer might include active surveillance or treatment depending on patient and cancer characteristics. In that context, we discussed the nature, risks and benefits of continued surveillance, other lab tests, transrectal ultrasound/prostate biopsy, or prostate MRI. All questions answered. PSAD = 0.067. Disc repeat PSA and MRI to assess for cancer and prostate size. All questions answered. He "doesn't like PSA" but will proceed with a PSA test. Printed form given for [facility]. , - 2022    NON-GU PMH: Arthritis GERD Heart disease, unspecified Hypercholesterolemia    FAMILY HISTORY: 2 daughters - Other Death In The Family Father - Other Death In The Family Mother - Other   SOCIAL HISTORY: Marital Status: Married Preferred Language: English; Ethnicity: Not Hispanic Or Latino; Race: White Current Smoking Status: Patient has never smoked.   Tobacco Use Assessment Completed: Used Tobacco in last 30 days? Does not use  smokeless tobacco. Does drink.  Does not use drugs. Does not drink caffeine.    REVIEW OF SYSTEMS:    GU Review Male:   Patient reports frequent urination, hard to postpone urination, get up at night to urinate, stream starts and stops, and trouble starting your stream. Patient denies burning/ pain with urination, leakage of urine, have to strain to urinate , erection problems, and penile pain.  Gastrointestinal (Upper):   Patient denies nausea, vomiting, and indigestion/ heartburn.  Gastrointestinal (Lower):   Patient denies diarrhea and constipation.  Constitutional:   Patient denies fever, night sweats, weight loss, and fatigue.  Skin:   Patient denies skin rash/ lesion and itching.  Eyes:   Patient denies blurred vision and double vision.  Ears/ Nose/ Throat:   Patient denies sore throat and sinus problems.  Hematologic/Lymphatic:   Patient denies swollen glands and easy bruising.  Cardiovascular:   Patient denies leg swelling and chest pains.  Respiratory:   Patient denies cough and shortness of breath.  Endocrine:   Patient denies excessive thirst.  Musculoskeletal:   Patient denies back pain and joint pain.  Neurological:   Patient denies headaches  and dizziness.  Psychologic:   Patient denies depression and anxiety.   VITAL SIGNS:      03/31/2023 02:40 PM  BP 143/85 mmHg  Pulse 63 /min  Temperature 97.3 F / 36.2 C   MULTI-SYSTEM PHYSICAL EXAMINATION:    Constitutional: Well-nourished. No physical deformities. Normally developed. Good grooming.  Neck: Neck symmetrical, not swollen. Normal tracheal position.  Respiratory: No labored breathing, no use of accessory muscles.   Cardiovascular: Normal temperature, normal extremity pulses, no swelling, no varicosities.  Skin: No paleness, no jaundice, no cyanosis. No lesion, no ulcer, no rash.  Neurologic / Psychiatric: Oriented to time, oriented to place, oriented to person. No depression, no anxiety, no agitation.   Gastrointestinal: No mass, no tenderness, no rigidity, non obese abdomen.  Musculoskeletal: Normal gait and station of head and neck.     Complexity of Data:  Source Of History:  Patient, Medical Record Summary  Lab Test Review:   PSA  Records Review:   Previous Doctor Records, Previous Hospital Records, Previous Patient Records  Urine Test Review:   Urinalysis  Urodynamics Review:   Review Bladder Scan, Review Flow Rate  X-Ray Review: Prostate Ultrasound: Reviewed Films.     10/14/21 03/18/21  PSA  Total PSA 2.0 ng/ml 3.2 ng/ml    03/31/23  Urinalysis  Urine Appearance Clear   Urine Color Yellow   Urine Glucose Neg mg/dL  Urine Bilirubin Neg mg/dL  Urine Ketones Trace mg/dL  Urine Specific Gravity 1.025   Urine Blood Neg ery/uL  Urine pH 5.5   Urine Protein Neg mg/dL  Urine Urobilinogen 0.2 mg/dL  Urine Nitrites Neg   Urine Leukocyte Esterase Neg leu/uL   PROCEDURES:          Visit Complexity - G2211          Urinalysis Dipstick Dipstick Cont'd  Color: Yellow Bilirubin: Neg mg/dL  Appearance: Clear Ketones: Trace mg/dL  Specific Gravity: 1.610 Blood: Neg ery/uL  pH: 5.5 Protein: Neg mg/dL  Glucose: Neg mg/dL Urobilinogen: 0.2 mg/dL    Nitrites: Neg    Leukocyte Esterase: Neg leu/uL    ASSESSMENT:      ICD-10 Details  1 GU:   BPH w/LUTS - N40.1 Chronic, Worsening  2   Weak Urinary Stream - R39.12 Chronic, Worsening  3 NON-GU:   Encounter for other preprocedural examination - Z01.818 Undiagnosed New Problem   PLAN:           Orders Labs CBC with Diff, CMP, Urine Culture          Schedule Return Visit/Planned Activity: Keep Scheduled Appointment - Follow up MD, Schedule Surgery          Document Letter(s):  Created for Patient: Clinical Summary         Notes:   All questions answered to the best of my ability regarding the upcoming procedure expected postoperative course with understanding expressed by the patient. Precautionary urine culture sent  today to serve as a baseline. He will proceed with previously scheduled ablation with Dr. Mena Goes on 9/10.   **As part of his preop exam at the hospital, phlebotomy there was not able to draw his blood. historically that has not been an issue even with blood draws by our phlebotomist team. He will go by her lab prior to leaving clinic today and have a CBC/CMP drawn.        Next Appointment:      Next Appointment: 04/04/2023 09:30 AM    Appointment Type: Surgery  Location: [facility] [phone]    Provider: Jerilee Field, M.D.    Reason for Visit: OBS [facility] AQUABLATION      * Signed by Anne Fu, NP on 03/31/23 at 3:00 PM (EDT)*      Add: urine cx negative. Hgb 15.7.

## 2023-04-04 ENCOUNTER — Ambulatory Visit (HOSPITAL_COMMUNITY): Payer: Medicare Other | Admitting: Physician Assistant

## 2023-04-04 ENCOUNTER — Encounter (HOSPITAL_COMMUNITY): Payer: Self-pay | Admitting: Urology

## 2023-04-04 ENCOUNTER — Ambulatory Visit (HOSPITAL_BASED_OUTPATIENT_CLINIC_OR_DEPARTMENT_OTHER): Payer: Medicare Other | Admitting: Physician Assistant

## 2023-04-04 ENCOUNTER — Observation Stay (HOSPITAL_COMMUNITY)
Admission: RE | Admit: 2023-04-04 | Discharge: 2023-04-05 | Disposition: A | Discharge status: Home / Self Care | Payer: Medicare Other | Source: Ambulatory Visit | Attending: Urology | Admitting: Urology

## 2023-04-04 ENCOUNTER — Encounter (HOSPITAL_COMMUNITY)
Admission: RE | Disposition: A | Discharge status: Home / Self Care | Payer: Self-pay | Source: Ambulatory Visit | Attending: Urology

## 2023-04-04 DIAGNOSIS — E782 Mixed hyperlipidemia: Secondary | ICD-10-CM | POA: Diagnosis not present

## 2023-04-04 DIAGNOSIS — N401 Enlarged prostate with lower urinary tract symptoms: Principal | ICD-10-CM | POA: Diagnosis present

## 2023-04-04 DIAGNOSIS — R3912 Poor urinary stream: Secondary | ICD-10-CM | POA: Insufficient documentation

## 2023-04-04 DIAGNOSIS — N138 Other obstructive and reflux uropathy: Principal | ICD-10-CM | POA: Diagnosis present

## 2023-04-04 DIAGNOSIS — Z01818 Encounter for other preprocedural examination: Secondary | ICD-10-CM

## 2023-04-04 DIAGNOSIS — R350 Frequency of micturition: Secondary | ICD-10-CM | POA: Insufficient documentation

## 2023-04-04 DIAGNOSIS — Z96652 Presence of left artificial knee joint: Secondary | ICD-10-CM | POA: Insufficient documentation

## 2023-04-04 DIAGNOSIS — Z951 Presence of aortocoronary bypass graft: Secondary | ICD-10-CM | POA: Diagnosis not present

## 2023-04-04 LAB — BASIC METABOLIC PANEL
Anion gap: 12 (ref 5–15)
BUN: 20 mg/dL (ref 8–23)
CO2: 20 mmol/L — ABNORMAL LOW (ref 22–32)
Calcium: 9 mg/dL (ref 8.9–10.3)
Chloride: 104 mmol/L (ref 98–111)
Creatinine, Ser: 0.91 mg/dL (ref 0.61–1.24)
GFR, Estimated: >60 mL/min (ref >60)
Glucose, Bld: 98 mg/dL (ref 70–99)
Potassium: 4.7 mmol/L (ref 3.5–5.1)
Sodium: 136 mmol/L (ref 135–145)

## 2023-04-04 LAB — CBC
HCT: 46.7 % (ref 39.0–52.0)
Hemoglobin: 14.9 g/dL (ref 13.0–17.0)
MCH: 31.4 pg (ref 26.0–34.0)
MCHC: 31.9 g/dL (ref 30.0–36.0)
MCV: 98.3 fL (ref 80.0–100.0)
Platelets: 196 10*3/uL (ref 150–400)
RBC: 4.75 MIL/uL (ref 4.22–5.81)
RDW: 13.2 % (ref 11.5–15.5)
WBC: 5.3 10*3/uL (ref 4.0–10.5)
nRBC: 0 % (ref 0.0–0.2)

## 2023-04-04 SURGERY — ABLATION, PROSTATE, TRANSURETHRAL, USING WATERJET
Anesthesia: General

## 2023-04-04 MED ORDER — DEXAMETHASONE SODIUM PHOSPHATE 10 MG/ML IJ SOLN
INTRAMUSCULAR | Status: DC | PRN
Start: 2023-04-04 — End: 2023-04-04
  Administered 2023-04-04: 5 mg via INTRAVENOUS

## 2023-04-04 MED ORDER — DEXMEDETOMIDINE HCL IN NACL 80 MCG/20ML IV SOLN
INTRAVENOUS | Status: AC
  Filled 2023-04-04: qty 20

## 2023-04-04 MED ORDER — ROSUVASTATIN CALCIUM 20 MG PO TABS
40.0000 mg | ORAL_TABLET | Freq: Every day | ORAL | Status: DC
  Administered 2023-04-04: 40 mg via ORAL
  Filled 2023-04-04 (×2): qty 2

## 2023-04-04 MED ORDER — DEXAMETHASONE SODIUM PHOSPHATE 10 MG/ML IJ SOLN
INTRAMUSCULAR | Status: AC
  Filled 2023-04-04: qty 1

## 2023-04-04 MED ORDER — ZOLPIDEM TARTRATE 5 MG PO TABS: 5.0000 mg | ORAL_TABLET | Freq: Every evening | ORAL | Status: DC | PRN

## 2023-04-04 MED ORDER — SODIUM CHLORIDE 0.9 % IV SOLN: 250.0000 mL | INTRAVENOUS | Status: DC | PRN

## 2023-04-04 MED ORDER — ACETAMINOPHEN 325 MG PO TABS: 650.0000 mg | ORAL_TABLET | ORAL | Status: DC | PRN

## 2023-04-04 MED ORDER — LIDOCAINE HCL (CARDIAC) PF 100 MG/5ML IV SOSY
PREFILLED_SYRINGE | INTRAVENOUS | Status: DC | PRN
  Administered 2023-04-04: 60 mg via INTRAVENOUS

## 2023-04-04 MED ORDER — SODIUM CHLORIDE 0.9% FLUSH: 3.0000 mL | INTRAVENOUS | Status: DC | PRN

## 2023-04-04 MED ORDER — AZELASTINE HCL 0.1 % NA SOLN: 1.0000 | Freq: Every day | NASAL | Status: DC | PRN

## 2023-04-04 MED ORDER — PROPOFOL 10 MG/ML IV BOLUS
INTRAVENOUS | Status: DC | PRN
  Administered 2023-04-04: 140 mg via INTRAVENOUS

## 2023-04-04 MED ORDER — SODIUM CHLORIDE 0.9 % IV SOLN: INTRAVENOUS | Status: DC

## 2023-04-04 MED ORDER — AZELASTINE-FLUTICASONE 137-50 MCG/ACT NA SUSP: 1.0000 | Freq: Every day | NASAL | Status: DC | PRN

## 2023-04-04 MED ORDER — ORAL CARE MOUTH RINSE: 15.0000 mL | Freq: Once | OROMUCOSAL | Status: AC

## 2023-04-04 MED ORDER — FENTANYL CITRATE (PF) 100 MCG/2ML IJ SOLN
INTRAMUSCULAR | Status: AC
  Filled 2023-04-04: qty 2

## 2023-04-04 MED ORDER — FENTANYL CITRATE PF 50 MCG/ML IJ SOSY
PREFILLED_SYRINGE | INTRAMUSCULAR | Status: AC
  Filled 2023-04-04: qty 1

## 2023-04-04 MED ORDER — OXYCODONE HCL 5 MG PO TABS: 5.0000 mg | ORAL_TABLET | Freq: Once | ORAL | Status: DC | PRN

## 2023-04-04 MED ORDER — ONDANSETRON HCL 4 MG/2ML IJ SOLN
INTRAMUSCULAR | Status: AC
  Filled 2023-04-04: qty 2

## 2023-04-04 MED ORDER — DIPHENHYDRAMINE HCL 12.5 MG/5ML PO ELIX: 12.5000 mg | ORAL_SOLUTION | Freq: Four times a day (QID) | ORAL | Status: DC | PRN

## 2023-04-04 MED ORDER — FENTANYL CITRATE (PF) 100 MCG/2ML IJ SOLN
INTRAMUSCULAR | Status: DC | PRN
  Administered 2023-04-04 (×2): 50 ug via INTRAVENOUS
  Administered 2023-04-04: 100 ug via INTRAVENOUS

## 2023-04-04 MED ORDER — DAPAGLIFLOZIN PROPANEDIOL 10 MG PO TABS
10.0000 mg | ORAL_TABLET | Freq: Every day | ORAL | Status: DC
  Administered 2023-04-05: 10 mg via ORAL
  Filled 2023-04-04: qty 1

## 2023-04-04 MED ORDER — EPHEDRINE SULFATE (PRESSORS) 50 MG/ML IJ SOLN
INTRAMUSCULAR | Status: DC | PRN
Start: 2023-04-04 — End: 2023-04-04
  Administered 2023-04-04: 10 mg via INTRAVENOUS

## 2023-04-04 MED ORDER — DEXMEDETOMIDINE HCL IN NACL 200 MCG/50ML IV SOLN
INTRAVENOUS | Status: DC | PRN
Start: 2023-04-04 — End: 2023-04-04
  Administered 2023-04-04 (×3): 4 ug via INTRAVENOUS

## 2023-04-04 MED ORDER — ROCURONIUM BROMIDE 10 MG/ML (PF) SYRINGE
PREFILLED_SYRINGE | INTRAVENOUS | Status: AC
  Filled 2023-04-04: qty 10

## 2023-04-04 MED ORDER — TRANEXAMIC ACID-NACL 1000-0.7 MG/100ML-% IV SOLN
1000.0000 mg | INTRAVENOUS | Status: AC
  Administered 2023-04-04: 1000 mg via INTRAVENOUS
  Filled 2023-04-04: qty 100

## 2023-04-04 MED ORDER — CHLORHEXIDINE GLUCONATE 0.12 % MT SOLN
15.0000 mL | Freq: Once | OROMUCOSAL | Status: AC
  Administered 2023-04-04: 15 mL via OROMUCOSAL

## 2023-04-04 MED ORDER — ONDANSETRON HCL 4 MG/2ML IJ SOLN: 4.0000 mg | Freq: Once | INTRAMUSCULAR | Status: DC | PRN

## 2023-04-04 MED ORDER — LIDOCAINE HCL (PF) 2 % IJ SOLN
INTRAMUSCULAR | Status: AC
  Filled 2023-04-04: qty 5

## 2023-04-04 MED ORDER — DIPHENHYDRAMINE HCL 50 MG/ML IJ SOLN: 12.5000 mg | Freq: Four times a day (QID) | INTRAMUSCULAR | Status: DC | PRN

## 2023-04-04 MED ORDER — PROPOFOL 10 MG/ML IV BOLUS
INTRAVENOUS | Status: AC
  Filled 2023-04-04: qty 20

## 2023-04-04 MED ORDER — FENTANYL CITRATE PF 50 MCG/ML IJ SOSY
25.0000 ug | PREFILLED_SYRINGE | INTRAMUSCULAR | Status: DC | PRN
  Administered 2023-04-04 (×3): 50 ug via INTRAVENOUS

## 2023-04-04 MED ORDER — SUGAMMADEX SODIUM 200 MG/2ML IV SOLN
INTRAVENOUS | Status: DC | PRN
  Administered 2023-04-04: 200 mg via INTRAVENOUS

## 2023-04-04 MED ORDER — CEPHALEXIN 500 MG PO CAPS: 500.0000 mg | ORAL_CAPSULE | Freq: Every day | ORAL | 0 refills | Status: AC

## 2023-04-04 MED ORDER — SODIUM CHLORIDE 0.9% FLUSH
3.0000 mL | Freq: Two times a day (BID) | INTRAVENOUS | Status: DC
  Administered 2023-04-04: 3 mL via INTRAVENOUS

## 2023-04-04 MED ORDER — SODIUM CHLORIDE 0.9 % IR SOLN
3000.0000 mL | Status: DC
  Administered 2023-04-04: 3000 mL

## 2023-04-04 MED ORDER — EPHEDRINE 5 MG/ML INJ
INTRAVENOUS | Status: AC
  Filled 2023-04-04: qty 5

## 2023-04-04 MED ORDER — OXYBUTYNIN CHLORIDE 5 MG PO TABS: 5.0000 mg | ORAL_TABLET | Freq: Three times a day (TID) | ORAL | Status: DC | PRN

## 2023-04-04 MED ORDER — OXYCODONE HCL 5 MG/5ML PO SOLN: 5.0000 mg | Freq: Once | ORAL | Status: DC | PRN

## 2023-04-04 MED ORDER — LACTATED RINGERS IV SOLN: INTRAVENOUS | Status: DC

## 2023-04-04 MED ORDER — SODIUM CHLORIDE 0.9 % IV SOLN
2.0000 g | INTRAVENOUS | Status: AC
  Administered 2023-04-04: 2 g via INTRAVENOUS
  Filled 2023-04-04: qty 20

## 2023-04-04 MED ORDER — DOCUSATE SODIUM 100 MG PO CAPS
100.0000 mg | ORAL_CAPSULE | Freq: Two times a day (BID) | ORAL | Status: DC
  Administered 2023-04-04 – 2023-04-05 (×2): 100 mg via ORAL
  Filled 2023-04-04 (×2): qty 1

## 2023-04-04 MED ORDER — HYDRALAZINE HCL 20 MG/ML IJ SOLN
INTRAMUSCULAR | Status: AC
  Filled 2023-04-04: qty 1

## 2023-04-04 MED ORDER — FLUTICASONE PROPIONATE 50 MCG/ACT NA SUSP: 1.0000 | Freq: Every day | NASAL | Status: DC | PRN

## 2023-04-04 MED ORDER — ROCURONIUM BROMIDE 100 MG/10ML IV SOLN
INTRAVENOUS | Status: DC | PRN
  Administered 2023-04-04: 70 mg via INTRAVENOUS

## 2023-04-04 MED ORDER — ONDANSETRON HCL 4 MG/2ML IJ SOLN
INTRAMUSCULAR | Status: DC | PRN
  Administered 2023-04-04: 4 mg via INTRAVENOUS

## 2023-04-04 MED ORDER — OXYCODONE HCL 5 MG PO TABS: 5.0000 mg | ORAL_TABLET | ORAL | Status: DC | PRN

## 2023-04-04 MED ORDER — MOMETASONE FURO-FORMOTEROL FUM 100-5 MCG/ACT IN AERO
2.0000 | INHALATION_SPRAY | Freq: Two times a day (BID) | RESPIRATORY_TRACT | Status: DC
  Administered 2023-04-04 – 2023-04-05 (×2): 2 via RESPIRATORY_TRACT
  Filled 2023-04-04: qty 8.8

## 2023-04-04 MED ORDER — SODIUM CHLORIDE 0.9 % IR SOLN
Status: DC | PRN
  Administered 2023-04-04: 12000 mL via INTRAVESICAL

## 2023-04-04 MED ORDER — STERILE WATER FOR IRRIGATION IR SOLN
Status: DC | PRN
  Administered 2023-04-04: 500 mL

## 2023-04-04 MED ORDER — CLOPIDOGREL BISULFATE 75 MG PO TABS: 75.0000 mg | ORAL_TABLET | Freq: Every day | ORAL | Status: DC

## 2023-04-04 MED ORDER — AMISULPRIDE (ANTIEMETIC) 5 MG/2ML IV SOLN: 10.0000 mg | Freq: Once | INTRAVENOUS | Status: DC | PRN

## 2023-04-04 SURGICAL SUPPLY — 30 items
BAG DRN RND TRDRP ANRFLXCHMBR (UROLOGICAL SUPPLIES) ×1
BAG URINE DRAIN 2000ML AR STRL (UROLOGICAL SUPPLIES) ×1 IMPLANT
BAND INSRT 18 STRL LF DISP RB (MISCELLANEOUS) ×1
BAND RUBBER #18 3X1/16 STRL (MISCELLANEOUS) IMPLANT
CANISTER SUCT 3000ML PPV (MISCELLANEOUS) ×1 IMPLANT
CATH HEMA 3WAY 30CC 22FR COUDE (CATHETERS) IMPLANT
CATH HEMA 3WAY 30CC 24FR COUDE (CATHETERS) IMPLANT
COVER MAYO STAND STRL (DRAPES) ×1 IMPLANT
DRAPE FOOT SWITCH (DRAPES) ×1 IMPLANT
DRAPE SURG IRRIG POUCH 19X23 (DRAPES) IMPLANT
GEL ULTRASOUND 8.5O AQUASONIC (MISCELLANEOUS) ×1 IMPLANT
GLOVE SURG LX STRL 7.5 STRW (GLOVE) ×1 IMPLANT
GOWN STRL REUS W/ TWL XL LVL3 (GOWN DISPOSABLE) ×1 IMPLANT
GOWN STRL REUS W/TWL XL LVL3 (GOWN DISPOSABLE) ×1
HANDPIECE AQUABEAM (MISCELLANEOUS) ×1 IMPLANT
HOLDER FOLEY CATH W/STRAP (MISCELLANEOUS) IMPLANT
KIT TURNOVER KIT A (KITS) IMPLANT
LOOP CUT BIPOLAR 24F LRG (ELECTROSURGICAL) IMPLANT
MANIFOLD NEPTUNE II (INSTRUMENTS) ×1 IMPLANT
MAT ABSORB FLUID 56X50 GRAY (MISCELLANEOUS) ×1 IMPLANT
PACK CYSTO (CUSTOM PROCEDURE TRAY) ×1 IMPLANT
PACK DRAPE AQUABEAM (MISCELLANEOUS) ×1 IMPLANT
PIN SAFETY STERILE (MISCELLANEOUS) IMPLANT
SYR 30ML LL (SYRINGE) ×1 IMPLANT
SYR TOOMEY IRRIG 70ML (MISCELLANEOUS) ×2
SYRINGE TOOMEY IRRIG 70ML (MISCELLANEOUS) ×2 IMPLANT
TOWEL OR 17X26 10 PK STRL BLUE (TOWEL DISPOSABLE) ×1 IMPLANT
TUBING CONNECTING 10 (TUBING) ×2 IMPLANT
TUBING UROLOGY SET (TUBING) ×1 IMPLANT
UNDERPAD 30X36 HEAVY ABSORB (UNDERPADS AND DIAPERS) ×1 IMPLANT

## 2023-04-04 NOTE — Anesthesia Postprocedure Evaluation (Signed)
Anesthesia Post Note  Patient: Clayton Moris, PhD  Procedure(s) Performed: TRANSURETHRAL WATERJET ABLATION OF PROSTATE     Patient location during evaluation: PACU Anesthesia Type: General Level of consciousness: awake and alert and oriented Pain management: pain level controlled Vital Signs Assessment: post-procedure vital signs reviewed and stable Respiratory status: spontaneous breathing, nonlabored ventilation and respiratory function stable Cardiovascular status: blood pressure returned to baseline and stable Postop Assessment: no apparent nausea or vomiting Anesthetic complications: no   No notable events documented.  Last Vitals:  Vitals:   04/04/23 1145 04/04/23 1200  BP: 132/77 121/69  Pulse: 64 (!) 56  Resp: 12 12  Temp:    SpO2: 97% 100%    Last Pain:  Vitals:   04/04/23 1200  PainSc: 2                  Kumar Falwell A.

## 2023-04-04 NOTE — Transfer of Care (Signed)
Immediate Anesthesia Transfer of Care Note  Patient: Clayton Moris, Clayton Powell  Procedure(s) Performed: TRANSURETHRAL WATERJET ABLATION OF PROSTATE  Patient Location: PACU  Anesthesia Type:General  Level of Consciousness: awake, alert , oriented, and patient cooperative  Airway & Oxygen Therapy: Patient Spontanous Breathing and Patient connected to face mask oxygen  Post-op Assessment: Report given to RN, Post -op Vital signs reviewed and stable, and Patient moving all extremities  Post vital signs: Reviewed and stable  Last Vitals:  Vitals Value Taken Time  BP 148/74 04/04/23 1115  Temp    Pulse 75 04/04/23 1116  Resp 17 04/04/23 1116  SpO2 99 % 04/04/23 1116  Vitals shown include unfiled device data.  Last Pain:  Vitals:   04/04/23 0757  PainSc: 0-No pain         Complications: No notable events documented.

## 2023-04-04 NOTE — Op Note (Signed)
Preoperative diagnosis: BPH with lower urinary tract symptoms, weak stream, frequency  Postoperative diagnosis: Same   Procedure: Robotic water jet ablation of the prostate   Surgeon: Mena Goes   Anesthesia: General   Indication for procedure:   Findings:  EUA benign prostate about 70 g, smooth - no hard area or nodule  Cystoscopy revealed lateral lobe hypertrophy. Good resection. Per plan, apical tissue remained over veru and veru appeared normal. UOs identified following ablation and resection and noted to be normal.   Description of procedure:  He was brought to the operating room and placed supine on the operating table.  After adequate anesthesia he was placed lithotomy position. Timeout was performed to confirm the patient and procedure. The TRUS Stepper was mounted to the Articulating Arm and secured to OR bed. The ultrasound probe was attached to the stepper. Exam under anesthesia was performed and the TRUS was inserted per rectum.  There was no resistance. The ultrasound probe was aligned, and confirmation made that the prostate is centered and aligned using both transverse and sagittal views. The bladder neck, verumontanum and the central/transition zones were identified.  Genitalia were prepped and draped in the usual sterile fashion. The 23F AQUABEAM Handpiece is inserted into the prostatic urethra and a complete cystoscopic evaluation was performed by inspecting the prostate, bladder, and identifying the location of the verumontanum/external sphincter. The AQUABEAM Handpiece was secured to the Handpiece Articulating Arm. Confirmed alignment of AQUABEAM Handpiece and TRUS Probe to be parallel and colinear. Confirmation that AQUABEAM nozzle is centered and anterior of the bladder neck or the median lobe. The cystoscope was then retracted to visualize the verumontanum and external sphincter and the cystoscope tip was positioned just proximal to the external sphincter. Reconfirmed alignment  of the TRUS probe with the AQUABEAM Handpiece and compression applied with TRUS probe. Horizontal alignment of the Handpiece waterjet nozzle was performed. The Aquablation treatment zones were planned utilizing real-time TRUS to visualize the contour of the prostate and the depth and radial angles of resection were defined in the transverse view. In the sagittal view, the AQUABEAM nozzle is identified and position registered with software. The treatment contours were then adjusted to conform to the intended resection margins. The median lobe, bladder neck and verumontanum were marked and confirmed in the treatment contour. The Aquablation Treatment was then started following the resection contour confirmed under ultrasound guidance.  TOTAL AQUABLATION RESECTION TIME: Two passes - 9 min, 14 sec   Once Aquablation resection was complete the 24 French aqua beam handpiece was carefully removed.  The continuous-flow sheath with the visual obturator was passed and then the loop and handle.  The trigone and the ureteral orifices were identified.  Resection of some of the residual median lobe and bladder neck tissue was done.  The bladder neck was identified at 6:00 and this was taken up to 12:00 with fulguration of the bladder neck and prostate for hemostasis.  Slight amount of anterior tissue was resected.  Similarly from 6:00 up to 12:00 on the left side of the bladder neck was identified by resecting some of the ablated tissue to identify the bladder neck and cauterize any bleeding.  Some anterior tissue on the left was resected.  This created excellent hemostasis.  All the chips were evacuated.  Ureteral orifices again identified and noted to be normal without injury.  The scope was backed out and a 22 [place] hematuria catheter was placed with 30 cc in the balloon.  The balloon was seated  at the bladder neck and it was irrigated on light traction and noted to be clear to pink.  He was hooked up to CBI.  He was  cleaned up and placed supine.  Catheter was placed on traction.  He was awakened and taken to the cover room in stable condition.  Complications: None  Blood loss: 100 mL  Specimens: None  Drains: 22 French three-way hematuria catheter with 30 cc in the balloon  Disposition: Patient stable to PACU

## 2023-04-04 NOTE — Progress Notes (Signed)
S: Pt without complaint.   O: Vitals:   04/04/23 1330 04/04/23 1345  BP: 120/71 119/70  Pulse: (!) 57 62  Resp: 18 17  Temp:    SpO2: 95% 94%   He is well in the PACU.  Alert and oriented. Catheter on some gentle traction and he still bleeding slightly around it.  Urine was grade 2-3 off CBI.  Assessment/plan: Status post aqua ablation of the large prostate-probably best to keep him in for overnight observation on some gentle traction and slow CBI.

## 2023-04-04 NOTE — Anesthesia Procedure Notes (Signed)
Procedure Name: Intubation Date/Time: 04/04/2023 9:30 AM  Performed by: Johnette Abraham, CRNAPre-anesthesia Checklist: Patient identified, Emergency Drugs available, Suction available and Patient being monitored Patient Re-evaluated:Patient Re-evaluated prior to induction Oxygen Delivery Method: Circle System Utilized Preoxygenation: Pre-oxygenation with 100% oxygen Induction Type: IV induction Ventilation: Mask ventilation without difficulty Laryngoscope Size: Mac and 4 Grade View: Grade II Tube type: Oral Tube size: 8.0 mm Number of attempts: 1 Airway Equipment and Method: Stylet and Oral airway Placement Confirmation: ETT inserted through vocal cords under direct vision, positive ETCO2 and breath sounds checked- equal and bilateral Secured at: 23 cm Tube secured with: Tape Dental Injury: Teeth and Oropharynx as per pre-operative assessment

## 2023-04-04 NOTE — Interval H&P Note (Signed)
History and Physical Interval Note:  04/04/2023 9:05 AM  Clayton Moris, PhD  has presented today for surgery, with the diagnosis of BENIGN PROSTATE HYPERPLASIA.  The various methods of treatment have been discussed with the patient and family. After consideration of risks, benefits and other options for treatment, the patient has consented to  Procedure(s) with comments: TRANSURETHRAL WATERJET ABLATION OF PROSTATE (N/A) - 90 MINS FOR CASE as a surgical intervention.  The patient's history has been reviewed, patient examined, no change in status, stable for surgery.  I have reviewed the patient's chart and labs.  Questions were answered to the patient's satisfaction. He is well. No dysuria or gross hematuria. No fever, cough or congestion. Discussed again with patient and wife the nature r/b/a to AQB incl TUR, laser and PAE among others as well as meds. He has been on 5ari for 6 weeks and wants off all meds.    Jerilee Field

## 2023-04-05 DIAGNOSIS — N401 Enlarged prostate with lower urinary tract symptoms: Secondary | ICD-10-CM | POA: Diagnosis not present

## 2023-04-05 MED ORDER — CHLORHEXIDINE GLUCONATE CLOTH 2 % EX PADS
6.0000 | MEDICATED_PAD | Freq: Every day | CUTANEOUS | Status: DC
  Administered 2023-04-05: 6 via TOPICAL

## 2023-04-05 NOTE — Discharge Summary (Signed)
Physician Discharge Summary  Patient ID: Clayton DEROUSSE, PhD MRN: [id number] DOB/AGE: [birth year] 77 y.o.  Admit date: 04/04/2023 Discharge date: 04/05/2023  Admission Diagnoses:  Discharge Diagnoses:  Principal Problem:   BPH with obstruction/lower urinary tract symptoms   Discharged Condition: good  Hospital Course: Harvie Heck was admitted for observation following robotic water jet ablation of the prostate.  He has done well.  He has been up and ambulated this morning in the halls.  He is tolerated a regular diet.  CBI is off with clear urine and just some slight oozing around the catheter.  He is without complaint.  Ready for discharge.  Consults: None  Significant Diagnostic Studies: None  Treatments: surgery: Robotic Water Jet Ablation of Prostate   Discharge Exam: Blood pressure 106/62, pulse (!) 59, temperature 97.8 F (36.6 C), temperature source Oral, resp. rate 20, height 5\' 7"  (1.702 m), weight 83.5 kg, SpO2 97%. Alert and oriented, no acute distress, watching TV No focal deficits Abdomen soft and nontender Extremity-SCD in place.  No calf pain or swelling. GU-Foley in place.  Some mild oozing of dark blood on a Kleenex around the meatus.  This was changed and remained with just some spotting.  Urine clear.  CBI clamped.  Disposition: Discharge disposition: 01-Home or Self Care       Discharge Instructions     Continue foley catheter   Complete by: As directed    Discharge patient   Complete by: As directed    Discharge disposition: 01-Home or Self Care   Discharge patient date: 04/05/2023      Allergies as of 04/05/2023       Reactions   Ciprofloxacin Hcl Rash   Quinolones Rash        Medication List     TAKE these medications    acetaminophen 500 MG tablet Commonly known as: TYLENOL Take 1,000 mg by mouth every 6 (six) hours as needed for moderate pain or mild pain.   Azelastine-Fluticasone 137-50 MCG/ACT Susp Place 1 spray into both  nostrils daily as needed (Congestion).   CALCIUM CITRATE PO Take 1 tablet by mouth in the morning and at bedtime.   cephALEXin 500 MG capsule Commonly known as: KEFLEX Take 1 capsule (500 mg total) by mouth at bedtime for 10 days.   clopidogrel 75 MG tablet Commonly known as: Plavix Take 1 tablet (75 mg total) by mouth daily. Start taking on: April 07, 2023 What changed: These instructions start on April 07, 2023. If you are unsure what to do until then, ask your doctor or other care provider.   Coenzyme Q10 200 MG capsule Take 200 mg by mouth daily.   dapagliflozin propanediol 10 MG Tabs tablet Commonly known as: Farxiga Take 1 tablet (10 mg total) by mouth daily before breakfast.   finasteride 5 MG tablet Commonly known as: PROSCAR Take 5 mg by mouth daily.   icosapent Ethyl 1 g capsule Commonly known as: VASCEPA Take 1 g by mouth 4 (four) times daily.   multivitamin capsule Take 1 capsule by mouth daily.   rosuvastatin 40 MG tablet Commonly known as: CRESTOR Take 40 mg by mouth daily.   Symbicort 80-4.5 MCG/ACT inhaler Generic drug: budesonide-formoterol Inhale 2 puffs into the lungs 2 (two) times daily as needed (shortness of breath).   tadalafil 5 MG tablet Commonly known as: CIALIS Take 5 mg by mouth daily.   Vitamin B-12 5000 MCG Subl Place 5,000 mcg under the tongue daily.   Vitamin D 50  MCG (2000 UT) Caps Take 2,000 Units by mouth daily.        Follow-up Information     Jerilee Field, MD Follow up on 04/11/2023.   Specialty: Urology Why: at 8:15 AM with NP [facility] information: [address] [place] [address] [phone]                 Signed: Jerilee Field 04/05/2023, 8:50 AM

## 2023-04-05 NOTE — Plan of Care (Signed)
?  Problem: Education: ?Goal: Knowledge of the prescribed therapeutic regimen will improve ?Outcome: Adequate for Discharge ?  ?Problem: Bowel/Gastric: ?Goal: Gastrointestinal status for postoperative course will improve ?Outcome: Adequate for Discharge ?  ?Problem: Health Behavior/Discharge Planning: ?Goal: Identification of resources available to assist in meeting health care needs will improve ?Outcome: Adequate for Discharge ?  ?Problem: Skin Integrity: ?Goal: Demonstration of wound healing without infection will improve ?Outcome: Adequate for Discharge ?  ?Problem: Urinary Elimination: ?Goal: Ability to avoid or minimize complications of infection will improve ?Outcome: Adequate for Discharge ?  ?Problem: Education: ?Goal: Knowledge of General Education information will improve ?Description: Including pain rating scale, medication(s)/side effects and non-pharmacologic comfort measures ?Outcome: Adequate for Discharge ?  ?Problem: Health Behavior/Discharge Planning: ?Goal: Ability to manage health-related needs will improve ?Outcome: Adequate for Discharge ?  ?Problem: Clinical Measurements: ?Goal: Ability to maintain clinical measurements within normal limits will improve ?Outcome: Adequate for Discharge ?Goal: Will remain free from infection ?Outcome: Adequate for Discharge ?Goal: Diagnostic test results will improve ?Outcome: Adequate for Discharge ?Goal: Respiratory complications will improve ?Outcome: Adequate for Discharge ?Goal: Cardiovascular complication will be avoided ?Outcome: Adequate for Discharge ?  ?Problem: Activity: ?Goal: Risk for activity intolerance will decrease ?Outcome: Adequate for Discharge ?  ?Problem: Nutrition: ?Goal: Adequate nutrition will be maintained ?Outcome: Adequate for Discharge ?  ?Problem: Coping: ?Goal: Level of anxiety will decrease ?Outcome: Adequate for Discharge ?  ?Problem: Elimination: ?Goal: Will not experience complications related to bowel motility ?Outcome:  Adequate for Discharge ?Goal: Will not experience complications related to urinary retention ?Outcome: Adequate for Discharge ?  ?Problem: Pain Managment: ?Goal: General experience of comfort will improve ?Outcome: Adequate for Discharge ?  ?Problem: Safety: ?Goal: Ability to remain free from injury will improve ?Outcome: Adequate for Discharge ?  ?Problem: Skin Integrity: ?Goal: Risk for impaired skin integrity will decrease ?Outcome: Adequate for Discharge ?  ?

## 2023-04-05 NOTE — TOC CM/SW Note (Signed)
Transition of Care [facility]) - Inpatient Brief Assessment   Patient Details  Name: Clayton KLEMM, PhD MRN: [id number] Date of Birth: 10-May-1946  Transition of [facility]) CM/SW Contact:    Larrie Kass, LCSW Phone Number: 04/05/2023, 9:25 AM   Clinical Narrative:    Transition of Care Asessment: Insurance and Status: Insurance coverage has been reviewed Patient has primary care physician: Yes Home environment has been reviewed: home with wife Prior level of function:: independent Prior/Current Home Services: No current home services Social Determinants of Health Reivew: SDOH reviewed no interventions necessary Readmission risk has been reviewed: Yes Transition of care needs: no transition of care needs at this time

## 2023-04-05 NOTE — Plan of Care (Signed)
  Problem: Bowel/Gastric: Goal: Gastrointestinal status for postoperative course will improve Outcome: Progressing   Problem: Urinary Elimination: Goal: Ability to avoid or minimize complications of infection will improve Outcome: Progressing   Problem: Elimination: Goal: Will not experience complications related to bowel motility Outcome: Progressing   Problem: Pain Managment: Goal: General experience of comfort will improve Outcome: Progressing

## 2023-04-05 NOTE — Care Management Obs Status (Signed)
MEDICARE OBSERVATION STATUS NOTIFICATION   Patient Details  Name: Clayton BORNER, PhD MRN: [id number] Date of Birth: 1945-09-21   Medicare Observation Status Notification Given:  Yes    Larrie Kass, LCSW 04/05/2023, 9:21 AM

## 2023-04-07 ENCOUNTER — Ambulatory Visit: Payer: Medicare Other | Admitting: Internal Medicine

## 2023-04-13 ENCOUNTER — Encounter (HOSPITAL_BASED_OUTPATIENT_CLINIC_OR_DEPARTMENT_OTHER): Payer: Self-pay

## 2023-04-20 ENCOUNTER — Other Ambulatory Visit: Payer: Self-pay | Admitting: Internal Medicine

## 2023-04-20 MED ORDER — ICOSAPENT ETHYL 1 G PO CAPS: 1.0000 g | ORAL_CAPSULE | Freq: Four times a day (QID) | ORAL | 10 refills | Status: DC

## 2023-04-21 LAB — BASIC METABOLIC PANEL
BUN/Creatinine Ratio: 22 (ref 10–24)
BUN: 20 mg/dL (ref 8–27)
CO2: 21 mmol/L (ref 20–29)
Calcium: 9.8 mg/dL (ref 8.6–10.2)
Chloride: 104 mmol/L (ref 96–106)
Creatinine, Ser: 0.93 mg/dL (ref 0.76–1.27)
Glucose: 97 mg/dL (ref 70–99)
Potassium: 4.6 mmol/L (ref 3.5–5.2)
Sodium: 139 mmol/L (ref 134–144)
eGFR: 85 mL/min/{1.73_m2} (ref >59)

## 2023-05-12 ENCOUNTER — Telehealth: Payer: Self-pay | Admitting: Family Medicine

## 2023-05-12 ENCOUNTER — Other Ambulatory Visit: Payer: Self-pay

## 2023-05-12 ENCOUNTER — Emergency Department
Admission: EM | Admit: 2023-05-12 | Discharge: 2023-05-12 | Disposition: A | Discharge status: Home / Self Care | Payer: Medicare Other | Attending: Emergency Medicine | Admitting: Emergency Medicine

## 2023-05-12 DIAGNOSIS — R040 Epistaxis: Secondary | ICD-10-CM

## 2023-05-12 DIAGNOSIS — I2510 Atherosclerotic heart disease of native coronary artery without angina pectoris: Secondary | ICD-10-CM | POA: Insufficient documentation

## 2023-05-12 NOTE — ED Provider Notes (Signed)
[facility] Provider Note    Event Date/Time   First MD Initiated Contact with Patient 05/12/23 1727     (approximate)   History   Epistaxis   HPI  Clayton Moris, PhD is a 77 y.o. male with PMH of CAD, HLD, allergies, GERD and BPH presents for evaluation of epistaxis.  Patient states that he has had some sinus congestion and cough since Monday.  He reports that he had a nosebleed yesterday that he was able to get the bleeding to stop.  Today his nose started bleeding and he noticed large clots were coming out.  He denies any blood thinners.  Nasal clamp applied in triage.     Physical Exam   Triage Vital Signs: ED Triage Vitals  Encounter Vitals Group     BP 05/12/23 1641 (!) 132/95     Systolic BP Percentile --      Diastolic BP Percentile --      Pulse Rate 05/12/23 1641 69     Resp 05/12/23 1641 18     Temp 05/12/23 1641 98.3 F (36.8 C)     Temp Source 05/12/23 1641 Oral     SpO2 05/12/23 1641 96 %     Weight 05/12/23 1642 178 lb (80.7 kg)     Height 05/12/23 1642 5\' 7"  (1.702 m)     Head Circumference --      Peak Flow --      Pain Score 05/12/23 1646 0     Pain Loc --      Pain Education --      Exclude from Growth Chart --     Most recent vital signs: Vitals:   05/12/23 1641 05/12/23 1642  BP: (!) 132/95 (!) 132/95  Pulse: 69 71  Resp: 18 18  Temp: 98.3 F (36.8 C) 98.3 F (36.8 C)  SpO2: 96% 100%    General: Awake, no distress.  CV:  Good peripheral perfusion. RRR. Resp:  Normal effort. CTAB. Abd:  No distention.  Other:  Nasal turbinates are erythematous but no active bleeding.   ED Results / Procedures / Treatments   Labs (all labs ordered are listed, but only abnormal results are displayed) Labs Reviewed - No data to display   PROCEDURES:  Critical Care performed: No  Procedures   MEDICATIONS ORDERED IN ED: Medications - No data to display   IMPRESSION / MDM / ASSESSMENT AND PLAN / ED COURSE  I  reviewed the triage vital signs and the nursing notes.                             77 year old male presents for evaluation of epistaxis.  Vital signs stable in triage patient NAD on exam.  Differential diagnosis includes, but is not limited to, epistaxis, hypertension, bleeding disorder, sinus infection.  Patient's presentation is most consistent with acute, uncomplicated illness.  Patient had a nose clamp placed in triage.  At the time of my exam the nose clamp was removed and he was not bleeding any further.  Nasal exam reassuring.  Patient reports that his sinus congestion is improving so I do not feel there is any indication for antibiotics at this time.  I did advise him to continue using the Flonase as well as daily allergy medication.  I also recommended that he try a humidifier.  He voiced understanding, all questions were answered and he was stable at discharge.  FINAL CLINICAL IMPRESSION(S) / ED DIAGNOSES   Final diagnoses:  Epistaxis     Rx / DC Orders   ED Discharge Orders     None        Note:  This document was prepared using Dragon voice recognition software and may include unintentional dictation errors.   Cameron Ali, PA-C 05/12/23 1840    Chesley Noon, MD 05/12/23 1935

## 2023-05-12 NOTE — Telephone Encounter (Signed)
FYI: This call has been transferred to Access Nurse. Once the result note has been entered staff can address the message at that time.  Patient called in with the following symptoms:  Red Word: nose bleed Patient called in and stated that he has been having a nose bleed for 2 days. He says the only way he can stop it is by pinching his nose.   Please advise at Mobile [phone] (mobile)  Message is routed to Provider Pool and [facility] Triage

## 2023-05-12 NOTE — ED Triage Notes (Signed)
C/O sinus congestion and cough since Monday. Yesterday  with nose bleed, and bleeding stopped.  Today left nostril nose bleed.  Onset today at 0730.  Nose clamp in place. NAD  Bleeding controlled.

## 2023-05-12 NOTE — Telephone Encounter (Signed)
Please make sure he is getting checked in person. Thanks.

## 2023-05-12 NOTE — ED Notes (Signed)
See triage note. Bleeding currently controlled. Pt on plavix. A&Ox4, NAD.

## 2023-05-12 NOTE — Telephone Encounter (Signed)
Per access nurse note pt agreed to go to ED due to nosebleed and being on blood thinner sending note to Dr Para March  and Para March pool.

## 2023-05-12 NOTE — Telephone Encounter (Signed)
To verify that pt was going to ED I spoke with pt and he said he is almost at [facility] ED now.pt thanks me for Dr Lianne Bushy concern.

## 2023-05-12 NOTE — Discharge Instructions (Signed)
Continue using the flonase and dayquil. Try adding a daily allergy medication. I also recommend using a humidifier at night.

## 2023-05-12 NOTE — Telephone Encounter (Signed)
Noted. Thanks.

## 2023-05-13 ENCOUNTER — Encounter: Payer: Self-pay | Admitting: Emergency Medicine

## 2023-05-13 ENCOUNTER — Other Ambulatory Visit: Payer: Self-pay

## 2023-05-13 ENCOUNTER — Emergency Department
Admission: EM | Admit: 2023-05-13 | Discharge: 2023-05-13 | Disposition: A | Discharge status: Home / Self Care | Payer: Medicare Other | Attending: Emergency Medicine | Admitting: Emergency Medicine

## 2023-05-13 DIAGNOSIS — Z7902 Long term (current) use of antithrombotics/antiplatelets: Secondary | ICD-10-CM | POA: Insufficient documentation

## 2023-05-13 DIAGNOSIS — R040 Epistaxis: Secondary | ICD-10-CM

## 2023-05-13 LAB — CBC WITH DIFFERENTIAL/PLATELET
Abs Immature Granulocytes: 0.01 10*3/uL (ref 0.00–0.07)
Basophils Absolute: 0 10*3/uL (ref 0.0–0.1)
Basophils Relative: 0 %
Eosinophils Absolute: 0.4 10*3/uL (ref 0.0–0.5)
Eosinophils Relative: 8 %
HCT: 43 % (ref 39.0–52.0)
Hemoglobin: 14 g/dL (ref 13.0–17.0)
Immature Granulocytes: 0 %
Lymphocytes Relative: 41 %
Lymphs Abs: 2 10*3/uL (ref 0.7–4.0)
MCH: 30.9 pg (ref 26.0–34.0)
MCHC: 32.6 g/dL (ref 30.0–36.0)
MCV: 94.9 fL (ref 80.0–100.0)
Monocytes Absolute: 0.6 10*3/uL (ref 0.1–1.0)
Monocytes Relative: 11 %
Neutro Abs: 1.9 10*3/uL (ref 1.7–7.7)
Neutrophils Relative %: 40 %
Platelets: 261 10*3/uL (ref 150–400)
RBC: 4.53 MIL/uL (ref 4.22–5.81)
RDW: 13.1 % (ref 11.5–15.5)
WBC: 4.8 10*3/uL (ref 4.0–10.5)
nRBC: 0 % (ref 0.0–0.2)

## 2023-05-13 LAB — PROTIME-INR
INR: 1 (ref 0.8–1.2)
Prothrombin Time: 13.8 s (ref 11.4–15.2)

## 2023-05-13 LAB — APTT: aPTT: 31 s (ref 24–36)

## 2023-05-13 MED ORDER — TRANEXAMIC ACID FOR EPISTAXIS
500.0000 mg | Freq: Once | TOPICAL | Status: AC
  Administered 2023-05-13: 500 mg via TOPICAL
  Filled 2023-05-13 (×2): qty 10

## 2023-05-13 MED ORDER — OXYMETAZOLINE HCL 0.05 % NA SOLN
1.0000 | Freq: Once | NASAL | Status: AC
  Administered 2023-05-13: 1 via NASAL
  Filled 2023-05-13: qty 30

## 2023-05-13 NOTE — ED Triage Notes (Signed)
Pt via POV from home. Pt states that he has been here the past 3 days he has had a nose bleed. Pt was seen yesterday and discharged. States that every time he takes the nasal clamp off it begins to bleed again. Pt reports blood thinners. Pt has nasal clamp on right now and bleeding is controlled. Pt is A&Ox4 and NAD, ambulatory to triage.

## 2023-05-13 NOTE — ED Notes (Signed)
Plug pulled from Left nostril, scant amount of blood noted on tissue. Will monitor for re-bleed.

## 2023-05-13 NOTE — ED Provider Notes (Signed)
[facility] Provider Note    Event Date/Time   First MD Initiated Contact with Patient 05/13/23 [phone]     (approximate)   History   Epistaxis   HPI  Clayton Moris, PhD is a 77 y.o. male who presents to the urgency department today because of concerns for continued nosebleed.  Started a few days ago.  Was seen in the emergency department yesterday and was given nasal clamp.  He states that he continues to feel some bleeding.  It is coming from the left naris.  Patient denies any trauma to his nose.  Patient is on Plavix.     Physical Exam   Triage Vital Signs: ED Triage Vitals  Encounter Vitals Group     BP 05/13/23 0843 (!) 153/83     Systolic BP Percentile --      Diastolic BP Percentile --      Pulse Rate 05/13/23 0843 73     Resp 05/13/23 0843 20     Temp --      Temp src --      SpO2 05/13/23 0843 98 %     Weight 05/13/23 0841 178 lb (80.7 kg)     Height 05/13/23 0841 5\' 7"  (1.702 m)     Head Circumference --      Peak Flow --      Pain Score 05/13/23 0841 0     Pain Loc --      Pain Education --      Exclude from Growth Chart --     Most recent vital signs: Vitals:   05/13/23 0843  BP: (!) 153/83  Pulse: 73  Resp: 20  SpO2: 98%   General: Awake, alert, oriented. CV:  Good peripheral perfusion. Regular rate and rhythm. Resp:  Normal effort. Lungs clear. Abd:  No distention.    ED Results / Procedures / Treatments   Labs (all labs ordered are listed, but only abnormal results are displayed) Labs Reviewed  PROTIME-INR  APTT  CBC WITH DIFFERENTIAL/PLATELET     EKG  None   RADIOLOGY None   PROCEDURES:  Critical Care performed: Yes  CRITICAL CARE Performed by: Phineas Semen   Total critical care time: *** minutes  Critical care time was exclusive of separately billable procedures and treating other patients.  Critical care was necessary to treat or prevent imminent or life-threatening  deterioration.  Critical care was time spent personally by me on the following activities: development of treatment plan with patient and/or surrogate as well as nursing, discussions with consultants, evaluation of patient's response to treatment, examination of patient, obtaining history from patient or surrogate, ordering and performing treatments and interventions, ordering and review of laboratory studies, ordering and review of radiographic studies, pulse oximetry and re-evaluation of patient's condition.   Procedures    MEDICATIONS ORDERED IN ED: Medications - No data to display   IMPRESSION / MDM / ASSESSMENT AND PLAN / ED COURSE  I reviewed the triage vital signs and the nursing notes.                              Differential diagnosis includes, but is not limited to, nose bleed, anemia  Patient's presentation is most consistent with acute presentation with potential threat to life or bodily function.   Patient presents to the emergency department today because of concerns for nosebleed that has been going on for the past few  days.  No obvious source of the bleed seen in anterior nose.  Will try Afrin and if that does not work we will escalate to TXA and then potentially [employer].  ***      FINAL CLINICAL IMPRESSION(S) / ED DIAGNOSES   Final diagnoses:  None     Rx / DC Orders   ED Discharge Orders     None        Note:  This document was prepared using Dragon voice recognition software and may include unintentional dictation errors.

## 2023-05-13 NOTE — ED Notes (Addendum)
Dr. Derrill Kay informed no blood work orders at this time, lt green, purple, and blue top sent to lab

## 2023-05-13 NOTE — ED Notes (Signed)
Pt stating the bleeding has stopped for now.

## 2023-05-13 NOTE — ED Notes (Addendum)
Pt stating his nose is still "leaking" blood. Tissue paper placed up to nose returns with drops of blood. Dr. Derrill Kay to be made aware.

## 2023-05-14 ENCOUNTER — Emergency Department
Admission: EM | Admit: 2023-05-14 | Discharge: 2023-05-14 | Disposition: A | Discharge status: Home / Self Care | Payer: Medicare Other | Attending: Emergency Medicine | Admitting: Emergency Medicine

## 2023-05-14 ENCOUNTER — Other Ambulatory Visit: Payer: Self-pay

## 2023-05-14 DIAGNOSIS — R0981 Nasal congestion: Secondary | ICD-10-CM | POA: Insufficient documentation

## 2023-05-14 DIAGNOSIS — R040 Epistaxis: Secondary | ICD-10-CM | POA: Diagnosis present

## 2023-05-14 MED ORDER — OXYMETAZOLINE HCL 0.05 % NA SOLN
1.0000 | Freq: Once | NASAL | Status: AC
  Administered 2023-05-14: 1 via NASAL
  Filled 2023-05-14: qty 30

## 2023-05-14 MED ORDER — LIDOCAINE VISCOUS HCL 2 % MT SOLN
15.0000 mL | Freq: Once | OROMUCOSAL | Status: AC
  Administered 2023-05-14: 15 mL via OROMUCOSAL
  Filled 2023-05-14: qty 15

## 2023-05-14 MED ORDER — CEPHALEXIN 500 MG PO CAPS: 500.0000 mg | ORAL_CAPSULE | Freq: Two times a day (BID) | ORAL | 0 refills | Status: AC

## 2023-05-14 NOTE — ED Triage Notes (Signed)
Pt comes via POV from home with c/o 3 days now of nose bleeds. Pt states he is on thinner. Pt has bloodwork done yesterday and everything was fine. Pt states he went to bed last night and it started again. Bleeding continues. Clamp in place.

## 2023-05-14 NOTE — ED Provider Notes (Signed)
[facility] Provider Note    Event Date/Time   First MD Initiated Contact with Patient 05/14/23 1108     (approximate)   History   Epistaxis   HPI  Clayton YOHMAN, PhD is a 77 y.o. male who is on Plavix who presents with left sided epistaxis.  This has been ongoing for several days, he has had nasal congestion.  Was seen in the emergency 5 yesterday bleeding was controlled when he left but it restarted again last night.  No injury or trauma.     Physical Exam   Triage Vital Signs: ED Triage Vitals  Encounter Vitals Group     BP 05/14/23 0805 (!) 159/88     Systolic BP Percentile --      Diastolic BP Percentile --      Pulse Rate 05/14/23 0805 72     Resp 05/14/23 0805 18     Temp 05/14/23 0805 97.8 F (36.6 C)     Temp src --      SpO2 05/14/23 0805 93 %     Weight --      Height --      Head Circumference --      Peak Flow --      Pain Score 05/14/23 0804 2     Pain Loc --      Pain Education --      Exclude from Growth Chart --     Most recent vital signs: Vitals:   05/14/23 0805  BP: (!) 159/88  Pulse: 72  Resp: 18  Temp: 97.8 F (36.6 C)  SpO2: 93%     General: Awake, no distress.  CV:  Good peripheral perfusion.  Resp:  Normal effort.  Abd:  No distention.  Other:  Left naris dried blood along the nasal passage with some fresh blood    ED Results / Procedures / Treatments   Labs (all labs ordered are listed, but only abnormal results are displayed) Labs Reviewed - No data to display   EKG     RADIOLOGY     PROCEDURES:  Critical Care performed:   .Epistaxis Management  Date/Time: 05/14/2023 12:50 PM  Performed by: Jene Every, MD Authorized by: Jene Every, MD   Consent:    Consent obtained:  Verbal Universal protocol:    Patient identity confirmed:  Verbally with patient Anesthesia:    Anesthesia method:  Topical application   Topical anesthetic:  Lidocaine gel Procedure details:     Treatment site:  L anterior   Treatment method:  Merocel sponge   Treatment episode: recurring   Post-procedure details:    Assessment:  Bleeding stopped   Procedure completion:  Tolerated well, no immediate complications    MEDICATIONS ORDERED IN ED: Medications  lidocaine (XYLOCAINE) 2 % viscous mouth solution 15 mL (15 mLs Mouth/Throat Given 05/14/23 1200)  oxymetazoline (AFRIN) 0.05 % nasal spray 1 spray (1 spray Each Nare Given 05/14/23 1134)     IMPRESSION / MDM / ASSESSMENT AND PLAN / ED COURSE  I reviewed the triage vital signs and the nursing notes. Patient's presentation is most consistent with acute illness / injury with system symptoms.  Patient presents with recurring epistaxis, appears to be anterior.  Likely related to ongoing congestion, Plavix.  Overall well-appearing in no acute distress.  Given recurrent bleeding and active bleeding here in the emergency department, Merisel placed with resolution of bleeding, he will follow-up with ENT, antibiotic prophylaxis provided.  FINAL CLINICAL IMPRESSION(S) / ED DIAGNOSES   Final diagnoses:  Epistaxis     Rx / DC Orders   ED Discharge Orders          Ordered    cephALEXin (KEFLEX) 500 MG capsule  2 times daily        05/14/23 1204             Note:  This document was prepared using Dragon voice recognition software and may include unintentional dictation errors.   Jene Every, MD 05/14/23 [phone]

## 2023-05-16 ENCOUNTER — Telehealth: Payer: Self-pay

## 2023-05-16 NOTE — Transitions of Care (Post Inpatient/ED Visit) (Signed)
I spoke with pt;pt seen at ED on 05/12/23,05/13/23 & 05/14/23 with nose bleed. finally a nasal tampon was placed on last ED visit. pt said bleeding continued until pt on his own stopped taking Plavix and bleeding stopped on 05/15/23. Pt said he spoke with card about stopping plavix for 5 days prior to procedure that was done in Sept and pt was advised that would  be OK. Pt said after sees ENT on Fri plans to restart plavix and I advised pt he should ck with card about stopping plavix because he does not want problem with developing a blood clot. Pt is still taking cephalexin 500 mg bid. UC & ED precautions given and pt voiced understanding. Sending note to Dr Para March as PCP.    05/16/2023  Name: Clayton GUINANE, PhD MRN: [id number] DOB: 11/21/1945  Today's TOC FU Call Status: Today's TOC FU Call Status:: Successful TOC FU Call Completed TOC FU Call Complete Date: 05/16/23 Patient's Name and Date of Birth confirmed.  Transition Care Management Follow-up Telephone Call Date of Discharge: 05/14/23 Discharge Facility: [facility]) Type of Discharge: Emergency Department Reason for ED Visit:  (I spoke with pt;pt seen at ED on 05/12/23,05/13/23 & 05/14/23 with nose bleed. finally a nasal tampon was placed on  last ED visit. pt said bleeding continued until pt on his own stopped taking Plavix and bleeding stopped on 05/15/23.) How have you been since you were released from the hospital?: Better Any questions or concerns?: No  Items Reviewed: Did you receive and understand the discharge instructions provided?: Yes Medications obtained,verified, and reconciled?: Partial Review Completed Reason for Partial Mediation Review: pt still taking cephalexin 500 mg bid for 5 days and pt stopped plavix on his own after cking with card about stopping plavix prior to procedure donein Sept 2024n for 5 days. pt has appt to see ENT on 05/19/23 and plans on restarting plavix after that. I  adavised pt should ck with card about stoping plavix but pt said it was ok before and he thinks it is ok now. Any new allergies since your discharge?: No Dietary orders reviewed?: NA Do you have support at home?: Yes Name of Support/Comfort Primary Source: Junious Dresser  Medications Reviewed Today: Medications Reviewed Today   Medications were not reviewed in this encounter     Home Care and Equipment/Supplies: Were Home Health Services Ordered?: NA Any new equipment or medical supplies ordered?: NA  Functional Questionnaire: Do you need assistance with bathing/showering or dressing?: No Do you need assistance with meal preparation?: No Do you need assistance with eating?: No Do you have difficulty maintaining continence: No Do you need assistance with getting out of bed/getting out of a chair/moving?: No Do you have difficulty managing or taking your medications?: No  Follow up appointments reviewed: PCP Follow-up appointment confirmed?: NA Specialist Hospital Follow-up appointment confirmed?: Yes Date of Specialist follow-up appointment?: 05/19/23 Follow-Up Specialty Provider:: ENT Do you need transportation to your follow-up appointment?: No Do you understand care options if your condition(s) worsen?: Yes-patient verbalized understanding    SIGNATURE Lewanda Rife, LPN

## 2023-05-16 NOTE — Telephone Encounter (Signed)
Agree. Thanks

## 2023-05-17 ENCOUNTER — Encounter (HOSPITAL_BASED_OUTPATIENT_CLINIC_OR_DEPARTMENT_OTHER): Payer: Self-pay

## 2023-05-26 LAB — NMR, LIPOPROFILE
Cholesterol, Total: 99 mg/dL — ABNORMAL LOW (ref 100–199)
HDL Particle Number: 30.9 umol/L (ref >=30.5)
HDL-C: 37 mg/dL — ABNORMAL LOW (ref >39)
LDL Particle Number: <300 nmol/L (ref <1000)
LDL-C (NIH Calc): 39 mg/dL (ref 0–99)
LP-IR Score: 49 — ABNORMAL HIGH (ref <=45)
Small LDL Particle Number: 152 nmol/L (ref <=527)
Triglycerides: 132 mg/dL (ref 0–149)

## 2023-05-30 ENCOUNTER — Other Ambulatory Visit (INDEPENDENT_AMBULATORY_CARE_PROVIDER_SITE_OTHER): Payer: Self-pay | Admitting: Nurse Practitioner

## 2023-05-30 DIAGNOSIS — Z9889 Other specified postprocedural states: Secondary | ICD-10-CM

## 2023-06-01 ENCOUNTER — Encounter (INDEPENDENT_AMBULATORY_CARE_PROVIDER_SITE_OTHER): Payer: Self-pay | Admitting: Vascular Surgery

## 2023-06-01 ENCOUNTER — Ambulatory Visit (INDEPENDENT_AMBULATORY_CARE_PROVIDER_SITE_OTHER): Payer: Medicare Other | Admitting: Vascular Surgery

## 2023-06-01 ENCOUNTER — Ambulatory Visit (INDEPENDENT_AMBULATORY_CARE_PROVIDER_SITE_OTHER): Payer: Medicare Other

## 2023-06-01 ENCOUNTER — Ambulatory Visit: Payer: Medicare Other | Admitting: Internal Medicine

## 2023-06-01 VITALS — BP 128/83 | HR 73 | Resp 18 | Ht 69.0 in | Wt 179.0 lb

## 2023-06-01 DIAGNOSIS — Z9889 Other specified postprocedural states: Secondary | ICD-10-CM | POA: Diagnosis not present

## 2023-06-01 DIAGNOSIS — I70221 Atherosclerosis of native arteries of extremities with rest pain, right leg: Secondary | ICD-10-CM | POA: Diagnosis not present

## 2023-06-01 DIAGNOSIS — E782 Mixed hyperlipidemia: Secondary | ICD-10-CM | POA: Diagnosis not present

## 2023-06-01 DIAGNOSIS — K219 Gastro-esophageal reflux disease without esophagitis: Secondary | ICD-10-CM

## 2023-06-01 DIAGNOSIS — I739 Peripheral vascular disease, unspecified: Secondary | ICD-10-CM

## 2023-06-01 DIAGNOSIS — I25119 Atherosclerotic heart disease of native coronary artery with unspecified angina pectoris: Secondary | ICD-10-CM

## 2023-06-02 ENCOUNTER — Ambulatory Visit: Payer: Medicare Other | Admitting: Internal Medicine

## 2023-06-02 ENCOUNTER — Telehealth (INDEPENDENT_AMBULATORY_CARE_PROVIDER_SITE_OTHER): Payer: Self-pay

## 2023-06-02 ENCOUNTER — Encounter: Payer: Self-pay | Admitting: Internal Medicine

## 2023-06-02 ENCOUNTER — Ambulatory Visit: Payer: Medicare Other | Attending: Internal Medicine | Admitting: Internal Medicine

## 2023-06-02 VITALS — BP 132/78 | HR 65 | Ht 67.0 in | Wt 179.0 lb

## 2023-06-02 DIAGNOSIS — Z951 Presence of aortocoronary bypass graft: Secondary | ICD-10-CM | POA: Insufficient documentation

## 2023-06-02 DIAGNOSIS — E782 Mixed hyperlipidemia: Secondary | ICD-10-CM | POA: Diagnosis present

## 2023-06-02 DIAGNOSIS — R931 Abnormal findings on diagnostic imaging of heart and coronary circulation: Secondary | ICD-10-CM | POA: Diagnosis present

## 2023-06-02 DIAGNOSIS — I200 Unstable angina: Secondary | ICD-10-CM

## 2023-06-02 DIAGNOSIS — I519 Heart disease, unspecified: Secondary | ICD-10-CM

## 2023-06-02 DIAGNOSIS — I209 Angina pectoris, unspecified: Secondary | ICD-10-CM

## 2023-06-02 DIAGNOSIS — I2510 Atherosclerotic heart disease of native coronary artery without angina pectoris: Secondary | ICD-10-CM | POA: Diagnosis present

## 2023-06-02 DIAGNOSIS — I25119 Atherosclerotic heart disease of native coronary artery with unspecified angina pectoris: Secondary | ICD-10-CM | POA: Diagnosis not present

## 2023-06-02 LAB — VAS US ABI WITH/WO TBI
Left ABI: 1.26
Right ABI: 0.8

## 2023-06-02 NOTE — Progress Notes (Unsigned)
LIPID CLINIC CONSULT NOTE  Chief Complaint:  Follow-up  Primary Care Physician: Clayton Nam, MD  Primary Cardiologist:  Clayton Nose, MD  HPI:  Clayton Moris, PhD is a 77 y.o. male who is being seen today for the evaluation of dyslipidemia at the request of Clayton Powell, Clayton Curd, MD. 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  He has been heavily involved in the lipoprotein NMR testing and has previously been followed by Dr. Karrie Powell.  He has a history of coronary artery disease with CABG in 2021, followed by Dr. Gwen Powell with the [facility] clinic.  He also has PAD with peripheral stents followed by Dr. Festus Powell.  He is currently on rosuvastatin 40 mg daily and will recently Vascepa 2 g twice daily.  He also takes aspirin 81 mg daily and clopidogrel 75 mg daily.  He provided lab work that was drawn on Powell 9, 2023, demonstrating total cholesterol 100, triglycerides 101, HDL 50 and LDL 31.  He had a lipid NMR performed back in November 2022 which showed an LDL-P less than 300, LDL-see if 30, HDL-C43, triglycerides 154 and total cholesterol 99.  HDL-P of 34 and small LDL P was undetectable (<90 nmol/L).  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  This is of course a significant risk factor for cardiovascular disease with a low prevalence.  Currently reports no chest pain or worsening shortness of breath.  He tries to walk regularly and says he plays golf.  06/02/2023  Clayton Powell returns today for follow-up.  He is transitioning to general cardiology care with me since Dr. Karrie Powell is no longer seeing patients and Dr. Jolene Powell has transferred to [facility].  He has had an excellent repeat lipid profile.  His LDL particle number is less than 300 with an LDL of 39, HDL 37 and triglycerides 132.  Small LDL particle #152.  Overall this appears to be low risk.  Since I last saw him he was seen by Clayton Shields, NP.  He  underwent a cardiac PET scan for preoperative clearance given a history of prior CABG.  He has been asymptomatic.  Unfortunately the PET scan was abnormal, suggesting mild basal inferolateral ischemia.  I reviewed the findings with Clayton Powell and felt that medical therapy was recommended.  The rest ejection fraction on this study was 45%.  It is unclear if this was accurate or not.  PMHx:  Past Medical History:  Diagnosis Date   Allergy    Anginal pain (HCC)    Arthritis    BPH (benign prostatic hypertrophy) 12/2005   CAD (coronary artery disease) 2010   dx made by coronary calcium scoring, Normal stress test in 2010.    GERD (gastroesophageal reflux disease) 06/2004   HLD (hyperlipidemia) 09/1997   Left rotator cuff tear 08/12/2011   Per Dr. Dion Powell 2013 L rotator cuff tear on MRI 2013    Peripheral vascular disease (HCC)    Right rotator cuff tear 05/24/2013   Wears glasses     Past Surgical History:  Procedure Laterality Date   admitted to [facility] chest pain  12/15/05   Dr. Juliann Powell   BLEPHAROPLASTY  10/11   Dr. Kemper Powell    bone spur removed Right    COLONOSCOPY  09/24/04   nml; Dr. Sharen Powell    CORONARY ARTERY BYPASS GRAFT N/A 05/29/2020   Procedure: CORONARY ARTERY BYPASS GRAFTING (CABG) TIMES THREE USING LIMA  to LAD; ENDOSCOPIC HARVESTED RIGHT GREATER SAPHENOUS VEIN: SVG to PD; SVG to RAMUS.;  Surgeon: Clayton Ovens, MD;  Location[facility] OR;  Service: Open Heart Surgery;  Laterality: N/A;   EGD esophagitis by bx  01/26/06   gastritis bx neg h-pylori   ENDOVEIN HARVEST OF GREATER SAPHENOUS VEIN Right 05/29/2020   Procedure: ENDOVEIN HARVEST OF GREATER SAPHENOUS VEIN;  Surgeon: Clayton Ovens, MD;  Location: [facility] OR;  Service: Open Heart Surgery;  Laterality: Right;   LEFT HEART CATH AND CORONARY ANGIOGRAPHY N/A 05/26/2020   Procedure: LEFT HEART CATH AND CORONARY ANGIOGRAPHY;  Surgeon: Clayton Blinks, MD;  Location: [facility] INVASIVE CV LAB;  Service: Cardiovascular;  Laterality: N/A;    LOWER EXTREMITY ANGIOGRAPHY Right 10/01/2020   Procedure: LOWER EXTREMITY ANGIOGRAPHY;  Surgeon: Clayton Needy, MD;  Location: [facility] INVASIVE CV LAB;  Service: Cardiovascular;  Laterality: Right;   LOWER EXTREMITY ANGIOGRAPHY Right 08/12/2021   Procedure: LOWER EXTREMITY ANGIOGRAPHY;  Surgeon: Clayton Needy, MD;  Location: [facility] INVASIVE CV LAB;  Service: Cardiovascular;  Laterality: Right;   LOWER EXTREMITY ANGIOGRAPHY Right 08/23/2021   Procedure: LOWER EXTREMITY ANGIOGRAPHY;  Surgeon: Clayton Needy, MD;  Location: [facility] INVASIVE CV LAB;  Service: Cardiovascular;  Laterality: Right;   PARTIAL KNEE ARTHROPLASTY Left 09/06/2022   Procedure: UNICOMPARTMENTAL KNEE;  Surgeon: Clayton Lucy, MD;  Location: [facility] ORS;  Service: Orthopedics;  Laterality: Left;   POLYPECTOMY     right and left prostate needle bx  05/02/03   acute inflammation; Dr. Patsi Powell    SHOULDER ARTHROSCOPY W/ ROTATOR CUFF REPAIR  12/2011   left   SHOULDER ARTHROSCOPY WITH ROTATOR CUFF REPAIR AND SUBACROMIAL DECOMPRESSION Right 05/24/2013   Procedure: RIGHT SHOULDER ARTHROSCOPY WITH ROTATOR CUFF REPAIR AND SUBACROMIAL DECOMPRESSION AND PARTIAL ACROMIOPLSTY WITH CORACOACROMIAL RELEASE, DISTAL CLAVICULECTOMY, LABRIAL DEBRIDEMENT;  Surgeon: Clayton Post, MD;  Location: [facility];  Service: Orthopedics;  Laterality: Right;   spect ETT nml  12/16/05   TEE WITHOUT CARDIOVERSION N/A 05/29/2020   Procedure: TRANSESOPHAGEAL ECHOCARDIOGRAM (TEE);  Surgeon: Clayton Ovens, MD;  Location: [facility] OR;  Service: Open Heart Surgery;  Laterality: N/A;   UPPER GASTROINTESTINAL ENDOSCOPY     WISDOM TOOTH EXTRACTION      FAMHx:  Family History  Problem Relation Age of Onset   Stroke Mother    Glaucoma Other        grandmother at 3   Barrett's esophagus Maternal Uncle    Prostate cancer Neg Hx    Colon cancer Neg Hx    Colon polyps Neg Hx    Esophageal cancer Neg Hx    Rectal cancer Neg Hx    Stomach cancer Neg Hx     SOCHx:    reports that he has never smoked. He has never used smokeless tobacco. He reports current alcohol use. He reports that he does not use drugs.  ALLERGIES:  Allergies  Allergen Reactions   Ciprofloxacin Hcl Rash   Quinolones Rash    ROS: Pertinent items noted in HPI and remainder of comprehensive ROS otherwise negative.  HOME MEDS: Current Outpatient Medications on File Prior to Visit  Medication Sig Dispense Refill   acetaminophen (TYLENOL) 500 MG tablet Take 1,000 mg by mouth every 6 (six) hours as needed for moderate pain or mild pain.     Azelastine-Fluticasone 137-50 MCG/ACT SUSP Place 1 spray into both nostrils daily as needed (Congestion).     CALCIUM CITRATE PO Take 600 mg by mouth in the morning  and at bedtime.     Cholecalciferol (VITAMIN D) 2000 UNITS CAPS Take 2,000 Units by mouth daily.     clopidogrel (PLAVIX) 75 MG tablet Take 1 tablet (75 mg total) by mouth daily.     Coenzyme Q10 200 MG capsule Take 200 mg by mouth daily.     Cyanocobalamin (VITAMIN B-12) 5000 MCG SUBL Place 5,000 mcg under the tongue daily.     dapagliflozin propanediol (FARXIGA) 10 MG TABS tablet Take 1 tablet (10 mg total) by mouth daily before breakfast. 90 tablet 3   icosapent Ethyl (VASCEPA) 1 g capsule Take 1 capsule (1 g total) by mouth 4 (four) times daily. 120 capsule 10   Multiple Vitamin (MULTIVITAMIN) capsule Take 1 capsule by mouth daily.     rosuvastatin (CRESTOR) 40 MG tablet Take 40 mg by mouth daily.     SYMBICORT 80-4.5 MCG/ACT inhaler Inhale 2 puffs into the lungs 2 (two) times daily as needed (shortness of breath).     tadalafil (CIALIS) 5 MG tablet Take 5 mg by mouth daily.     No current facility-administered medications on file prior to visit.    LABS/IMAGING: No results found for this or any previous visit (from the past 48 hour(s)). No results found.  LIPID PANEL:    Component Value Date/Time   CHOL 143 05/26/2020 0615   TRIG 141 05/26/2020 0615   HDL 52 05/26/2020  0615   CHOLHDL 2.8 05/26/2020 0615   VLDL 28 05/26/2020 0615   LDLCALC 63 05/26/2020 0615    WEIGHTS: Wt Readings from Last 3 Encounters:  06/02/23 179 lb (81.2 kg)  06/01/23 179 lb (81.2 kg)  05/13/23 178 lb (80.7 kg)    VITALS: BP 132/78 (BP Location: Left Arm, Patient Position: Sitting, Cuff Size: Normal)   Pulse 65   Ht 5\' 7"  (1.702 m)   Wt 179 lb (81.2 kg)   SpO2 94%   BMI 28.04 kg/m   EXAM: General appearance: alert and no distress Neck: no carotid bruit, no JVD, and thyroid not enlarged, symmetric, no tenderness/mass/nodules Lungs: clear to auscultation bilaterally Heart: regular rate and rhythm, S1, S2 normal, no murmur, click, rub or gallop Abdomen: soft, non-tender; bowel sounds normal; no masses,  no organomegaly Extremities: extremities normal, atraumatic, no cyanosis or edema Pulses: 2+ and symmetric Skin: Skin color, texture, turgor normal. 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   Clayton Powell seems to have very well-controlled lipids.  He is now transferring total cardiac care to me.  He has a history of coronary artery disease with prior CABG and underwent cardiac PET for preoperative clearance at his request as he has been asymptomatic.  This showed  unfortunately some mild basal inferolateral ischemia with a reduced LVEF at 45%.  He remains asymptomatic.  Marcelline Deist was added to his med list.  Based on these  findings I would recommend an echo in 6 months to further evaluate his LV function and if necessary we may need to further titrate GDMT for his cardiomyopathy.  Follow-up in 6 months.  Clayton Nose, MD, [facility], FACP  [place]  [facility]  Medical Director of the Advanced Lipid Disorders &  Cardiovascular Risk Reduction Clinic Diplomate of the American Board of Clinical Lipidology Attending Cardiologist  Direct Dial: [phone]  Fax: [phone]  Website:  [email or web address] Nicely Alinna Siple 06/04/2023, 8:11 PM

## 2023-06-02 NOTE — Telephone Encounter (Signed)
Clayton Powell called with a few questions about his upcoming right leg angio on Tuesday. He would like to know what restrictions would he have and for how long.   Please advise

## 2023-06-02 NOTE — Patient Instructions (Signed)
Medication Instructions:  No changes  Continue taking medications  *If you need a refill on your cardiac medications before your next appointment, please call your pharmacy*   Lab Work: None   If you have labs (blood work) drawn today and your tests are completely normal, you will receive your results only by: MyChart Message (if you have MyChart) OR A paper copy in the mail If you have any lab test that is abnormal or we need to change your treatment, we will call you to review the results.   Testing/Procedures: Echo in 6 months  Your physician has requested that you have an echocardiogram. Echocardiography is a painless test that uses sound waves to create images of your heart. It provides your doctor with information about the size and shape of your heart and how well your heart's chambers and valves are working. This procedure takes approximately one hour. There are no restrictions for this procedure. Please do NOT wear cologne, perfume, aftershave, or lotions (deodorant is allowed). Please arrive 15 minutes prior to your appointment time.  Please note: We ask at that you not bring children with you during ultrasound (echo/ vascular) testing. Due to room size and safety concerns, children are not allowed in the ultrasound rooms during exams. Our front office staff cannot provide observation of children in our lobby area while testing is being conducted. An adult accompanying a patient to their appointment will only be allowed in the ultrasound room at the discretion of the ultrasound technician under special circumstances. We apologize for any inconvenience.    Follow-Up: At [facility], you and your health needs are our priority.  As part of our continuing mission to provide you with exceptional heart care, we have created designated Provider Care Teams.  These Care Teams include your primary Cardiologist (physician) and Advanced Practice Providers (APPs -  Physician  Assistants and Nurse Practitioners) who all work together to provide you with the care you need, when you need it.   Your next appointment:   Follow up after echo   Provider:   DR. Zoila Shutter

## 2023-06-02 NOTE — Telephone Encounter (Signed)
Spoke with the patient and he is scheduled with Dr. Gilda Crease for 06/06/23 with a 8:00 am arrival time to the [facility] for a right leg angio. Pre-procedure instructions were discussed and will be sent to Mychart and mailed.

## 2023-06-04 ENCOUNTER — Encounter (INDEPENDENT_AMBULATORY_CARE_PROVIDER_SITE_OTHER): Payer: Self-pay | Admitting: Vascular Surgery

## 2023-06-04 DIAGNOSIS — I70229 Atherosclerosis of native arteries of extremities with rest pain, unspecified extremity: Secondary | ICD-10-CM | POA: Insufficient documentation

## 2023-06-04 DIAGNOSIS — I739 Peripheral vascular disease, unspecified: Secondary | ICD-10-CM | POA: Insufficient documentation

## 2023-06-04 NOTE — Progress Notes (Signed)
MRN : [id number]  Clayton LANTIS, PhD is a 77 y.o. (1945-10-29) male who presents with chief complaint of check circulation.  History of Present Illness:   The patient returns to the office for followup of atherosclerotic changes of the lower extremities and review of the noninvasive studies.   The patient notes that there has been a significant deterioration in the lower extremity symptoms.  The patient notes interval shortening of their claudication distance and development of severe right leg rest pain symptoms. No new ulcers or wounds have occurred since the last visit.  There have been no significant changes to the patient's overall health care.  The patient denies amaurosis fugax or recent TIA symptoms. There are no recent neurological changes noted. There is no history of DVT, PE or superficial thrombophlebitis. The patient denies recent episodes of angina or shortness of breath.   ABI's Rt=0.80 (monophasic) and Lt=1.26 (triphasic) (previous ABI's Rt=1.10 and Lt=1.15)   Current Meds  Medication Sig   CALCIUM CITRATE PO Take 600 mg by mouth in the morning and at bedtime.   Cholecalciferol (VITAMIN D) 2000 UNITS CAPS Take 2,000 Units by mouth daily.   clopidogrel (PLAVIX) 75 MG tablet Take 1 tablet (75 mg total) by mouth daily.   Coenzyme Q10 200 MG capsule Take 200 mg by mouth daily.   Cyanocobalamin (VITAMIN B-12) 5000 MCG SUBL Place 5,000 mcg under the tongue daily.   dapagliflozin propanediol (FARXIGA) 10 MG TABS tablet Take 1 tablet (10 mg total) by mouth daily before breakfast.   icosapent Ethyl (VASCEPA) 1 g capsule Take 1 capsule (1 g total) by mouth 4 (four) times daily.   Multiple Vitamin (MULTIVITAMIN) capsule Take 1 capsule by mouth daily.   rosuvastatin (CRESTOR) 40 MG tablet Take 40 mg by mouth daily.   SYMBICORT 80-4.5 MCG/ACT inhaler Inhale 2 puffs into the lungs 2 (two) times daily as  needed (shortness of breath).   tadalafil (CIALIS) 5 MG tablet Take 5 mg by mouth daily.    Past Medical History:  Diagnosis Date   Allergy    Anginal pain (HCC)    Arthritis    BPH (benign prostatic hypertrophy) 12/2005   CAD (coronary artery disease) 2010   dx made by coronary calcium scoring, Normal stress test in 2010.    GERD (gastroesophageal reflux disease) 06/2004   HLD (hyperlipidemia) 09/1997   Left rotator cuff tear 08/12/2011   Per Dr. Dion Saucier 2013 L rotator cuff tear on MRI 2013    Peripheral vascular disease (HCC)    Right rotator cuff tear 05/24/2013   Wears glasses     Past Surgical History:  Procedure Laterality Date   admitted to [facility] chest pain  12/15/05   Dr. Juliann Pares   BLEPHAROPLASTY  10/11   Dr. Kemper Durie    bone spur removed Right    COLONOSCOPY  09/24/04   nml; Dr. Sharen Hint    CORONARY ARTERY BYPASS GRAFT N/A 05/29/2020   Procedure: CORONARY ARTERY BYPASS GRAFTING (CABG) TIMES THREE USING LIMA to LAD; ENDOSCOPIC HARVESTED RIGHT GREATER SAPHENOUS VEIN: SVG to PD; SVG to RAMUS.;  Surgeon: Delight Ovens, MD;  Location: [facility] OR;  Service: Open Heart Surgery;  Laterality: N/A;   EGD esophagitis by bx  01/26/06   gastritis bx neg h-pylori   ENDOVEIN HARVEST OF GREATER SAPHENOUS VEIN Right 05/29/2020   Procedure: ENDOVEIN HARVEST OF GREATER SAPHENOUS VEIN;  Surgeon: Delight Ovens, MD;  Location: [facility] OR;  Service: Open Heart Surgery;  Laterality: Right;   LEFT HEART CATH AND CORONARY ANGIOGRAPHY N/A 05/26/2020   Procedure: LEFT HEART CATH AND CORONARY ANGIOGRAPHY;  Surgeon: Lamar Blinks, MD;  Location: [facility] INVASIVE [facility] LAB;  Service: Cardiovascular;  Laterality: N/A;   LOWER EXTREMITY ANGIOGRAPHY Right 10/01/2020   Procedure: LOWER EXTREMITY ANGIOGRAPHY;  Surgeon: Annice Needy, MD;  Location: [facility];  Service: Cardiovascular;  Laterality: Right;   LOWER EXTREMITY ANGIOGRAPHY Right 08/12/2021   Procedure: LOWER EXTREMITY ANGIOGRAPHY;  Surgeon: Annice Needy, MD;  Location: [facility];  Service: Cardiovascular;  Laterality: Right;   LOWER EXTREMITY ANGIOGRAPHY Right 08/23/2021   Procedure: LOWER EXTREMITY ANGIOGRAPHY;  Surgeon: Annice Needy, MD;  Location: [facility] LAB;  Service: Cardiovascular;  Laterality: Right;   PARTIAL KNEE ARTHROPLASTY Left 09/06/2022   Procedure: UNICOMPARTMENTAL KNEE;  Surgeon: Teryl Lucy, MD;  Location: [facility] ORS;  Service: Orthopedics;  Laterality: Left;   POLYPECTOMY     right and left prostate needle bx  05/02/03   acute inflammation; Dr. Patsi Sears    SHOULDER ARTHROSCOPY W/ ROTATOR CUFF REPAIR  12/2011   left   SHOULDER ARTHROSCOPY WITH ROTATOR CUFF REPAIR AND SUBACROMIAL DECOMPRESSION Right 05/24/2013   Procedure: RIGHT SHOULDER ARTHROSCOPY WITH ROTATOR CUFF REPAIR AND SUBACROMIAL DECOMPRESSION AND PARTIAL ACROMIOPLSTY WITH CORACOACROMIAL RELEASE, DISTAL CLAVICULECTOMY, LABRIAL DEBRIDEMENT;  Surgeon: Eulas Post, MD;  Location: [facility];  Service: Orthopedics;  Laterality: Right;   spect ETT nml  12/16/05   TEE WITHOUT CARDIOVERSION N/A 05/29/2020   Procedure: TRANSESOPHAGEAL ECHOCARDIOGRAM (TEE);  Surgeon: Delight Ovens, MD;  Location: [facility] OR;  Service: Open Heart Surgery;  Laterality: N/A;   UPPER GASTROINTESTINAL ENDOSCOPY     WISDOM TOOTH EXTRACTION      Social History Social History   Tobacco Use   Smoking status: Never   Smokeless tobacco: Never  Vaping Use   Vaping status: Never Used  Substance Use Topics   Alcohol use: Yes    Alcohol/week: 0.0 standard drinks of alcohol    Comment: beer, occ   Drug use: No    Family History Family History  Problem Relation Age of Onset   Stroke Mother    Glaucoma Other        grandmother at 65   Barrett's esophagus Maternal Uncle    Prostate cancer Neg Hx    Colon cancer Neg Hx    Colon polyps Neg Hx    Esophageal cancer Neg Hx    Rectal cancer Neg Hx    Stomach cancer Neg Hx     Allergies  Allergen  Reactions   Ciprofloxacin Hcl Rash   Quinolones Rash     REVIEW OF SYSTEMS (Negative unless checked)  Constitutional: [] Weight loss  [] Fever  [] Chills Cardiac: [] Chest pain   [] Chest pressure   [] Palpitations   [] Shortness of breath when laying flat   [] Shortness of breath with exertion. Vascular:  [x] Pain in legs with walking   [] Pain in legs at rest  [] History of DVT   [] Phlebitis   [] Swelling in legs   [] Varicose veins   [] Non-healing  ulcers Pulmonary:   [] Uses home oxygen   [] Productive cough   [] Hemoptysis   [] Wheeze  [] COPD   [] Asthma Neurologic:  [] Dizziness   [] Seizures   [] History of stroke   [] History of TIA  [] Aphasia   [] Vissual changes   [] Weakness or numbness in arm   [] Weakness or numbness in leg Musculoskeletal:   [] Joint swelling   [] Joint pain   [] Low back pain Hematologic:  [] Easy bruising  [] Easy bleeding   [] Hypercoagulable state   [] Anemic Gastrointestinal:  [] Diarrhea   [] Vomiting  [] Gastroesophageal reflux/heartburn   [] Difficulty swallowing. Genitourinary:  [] Chronic kidney disease   [] Difficult urination  [] Frequent urination   [] Blood in urine Skin:  [] Rashes   [] Ulcers  Psychological:  [] History of anxiety   []  History of major depression.  Physical Examination  Vitals:   06/01/23 1408  BP: 128/83  Pulse: 73  Resp: 18  Weight: 179 lb (81.2 kg)  Height: 5\' 9"  (1.753 m)   Body mass index is 26.43 kg/m. Gen: WD/WN, NAD Head: [place]/AT, No temporalis wasting.  Ear/Nose/Throat: Hearing grossly intact, nares w/o erythema or drainage Eyes: PER, EOMI, sclera nonicteric.  Neck: Supple, no masses.  No bruit or JVD.  Pulmonary:  Good air movement, no audible wheezing, no use of accessory muscles.  Cardiac: RRR, normal S1, S2, no Murmurs. Vascular:  mild trophic changes, no open wounds Vessel Right Left  Radial Palpable Palpable  PT Not Palpable Not Palpable  DP Not Palpable Not Palpable  Gastrointestinal: soft, non-distended. No guarding/no peritoneal signs.   Musculoskeletal: M/S 5/5 throughout.  No visible deformity.  Neurologic: CN 2-12 intact. Pain and light touch intact in extremities.  Symmetrical.  Speech is fluent. Motor exam as listed above. Psychiatric: Judgment intact, Mood & affect appropriate for pt's clinical situation. Dermatologic: No rashes or ulcers noted.  No changes consistent with cellulitis.   CBC Lab Results  Component Value Date   WBC 4.8 05/13/2023   HGB 14.0 05/13/2023   HCT 43.0 05/13/2023   MCV 94.9 05/13/2023   PLT 261 05/13/2023    BMET    Component Value Date/Time   NA 139 04/21/2023 0829   NA 140 05/12/2012 1240   K 4.6 04/21/2023 0829   K 4.2 05/12/2012 1240   CL 104 04/21/2023 0829   CL 107 05/12/2012 1240   CO2 21 04/21/2023 0829   CO2 26 05/12/2012 1240   GLUCOSE 97 04/21/2023 0829   GLUCOSE 98 04/04/2023 0755   GLUCOSE 101 (H) 05/12/2012 1240   BUN 20 04/21/2023 0829   BUN 21 (H) 05/12/2012 1240   CREATININE 0.93 04/21/2023 0829   CREATININE 0.86 05/12/2012 1240   CALCIUM 9.8 04/21/2023 0829   CALCIUM 8.9 05/12/2012 1240   GFRNONAA >60 04/04/2023 0755   GFRNONAA >60 05/12/2012 1240   GFRAA >60 05/12/2012 1240   CrCl cannot be calculated (Patient's most recent lab result is older than the maximum 21 days allowed.).  COAG Lab Results  Component Value Date   INR 1.0 05/13/2023   INR 1.4 (H) 05/29/2020   INR 1.1 05/28/2020    Radiology VAS [place] ABI WITH/WO TBI  Result Date: 06/02/2023  LOWER EXTREMITY DOPPLER STUDY Patient Name:  ALBARA SCORZA Cuadra  Date of Exam:   06/01/2023 Medical Rec #: [id number]         Accession #:    [id number] Date of Birth: Sep 04, 1945        Patient Gender: M Patient Age:   69 years Exam Location:  [facility] Procedure:      VAS [place] ABI WITH/WO TBI Referring Phys: Sheppard Plumber --------------------------------------------------------------------------------  Indications: Peripheral artery disease.  Vascular Interventions: 08/23/21: Right SFA/popliteal  stent x2;. Comparison Study: 12/12/2022 Performing Technologist: Debbe Bales RVS  Examination Guidelines: A complete evaluation includes at minimum, Doppler waveform signals and systolic blood pressure reading at the level of bilateral brachial, anterior tibial, and posterior tibial arteries, when vessel segments are accessible. Bilateral testing is considered an integral part of a complete examination. Photoelectric Plethysmograph (PPG) waveforms and toe systolic pressure readings are included as required and additional duplex testing as needed. Limited examinations for reoccurring indications may be performed as noted.  ABI Findings: +---------+------------------+-----+-------------------+--------+ Right    Rt Pressure (mmHg)IndexWaveform           Comment  +---------+------------------+-----+-------------------+--------+ Brachial 132                                                +---------+------------------+-----+-------------------+--------+ ATA      75                0.56 dampened monophasic         +---------+------------------+-----+-------------------+--------+ PTA      107               0.80 biphasic                    +---------+------------------+-----+-------------------+--------+ PERO     72                0.54 dampened monophasic         +---------+------------------+-----+-------------------+--------+ Great Toe57                0.43 Abnormal                    +---------+------------------+-----+-------------------+--------+ +---------+------------------+-----+---------+-------+ Left     Lt Pressure (mmHg)IndexWaveform Comment +---------+------------------+-----+---------+-------+ Brachial 133                                     +---------+------------------+-----+---------+-------+ ATA      141               1.06 biphasic         +---------+------------------+-----+---------+-------+ PTA      167               1.26 triphasic         +---------+------------------+-----+---------+-------+ Glee Arvin               1.04 Normal           +---------+------------------+-----+---------+-------+ +-------+-----------+-----------+------------+------------+ ABI/TBIToday's ABIToday's TBIPrevious ABIPrevious TBI +-------+-----------+-----------+------------+------------+ Right  .80        .43        1.10        .85          +-------+-----------+-----------+------------+------------+ Left   1.26       1.04       1.15        1.03         +-------+-----------+-----------+------------+------------+  Left ABIs and TBIs appear essentially unchanged compared to prior study on 12/12/2022. Right ABIs and TBIs appear decreased compared to prior study on 12/12/2022.  Summary: Right: Resting right ankle-brachial index indicates mild right lower extremity arterial  disease. The right toe-brachial index is abnormal. Imaging and Waveforms obtained throughout in the Right Lower Extremity. The Right Lower Extremity Stent appears to be occluded from SFA Proximal to SFA Distal ; flow restored in Distal segment via collateral flow. Left: Resting left ankle-brachial index is within normal range. The left toe-brachial index is normal. *See table(s) above for measurements and observations.  Electronically signed by Festus Barren MD on 06/02/2023 at 9:01:50 AM.    Final      Assessment/Plan 1. Atherosclerosis of native artery of right lower extremity with rest pain (HCC) Recommend:  The patient has evidence of severe atherosclerotic changes of both lower extremities with rest pain that is associated with preulcerative changes and impending tissue loss of the rght foot.  This represents a limb threatening ischemia and places the patient at the risk for right limb loss.  Patient should undergo angiography of the right lower extremity with the hope for intervention for limb salvage.  The risks and benefits as well as the alternative therapies was discussed  in detail with the patient.  All questions were answered.  Patient agrees to proceed with right lower extremity angiography.  The patient will follow up with me in the office after the procedure.   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 Atherosclerosis of native coronary artery of native heart with angina pectoris (HCC) Continue cardiac and antihypertensive medications as already ordered and reviewed, no changes at this time.  Continue statin as ordered and reviewed, no changes at this time  Nitrates PRN for chest pain  3. Gastroesophageal reflux disease without esophagitis Continue PPI as already ordered, this medication has been reviewed and there are no changes at this time.  Avoidence of caffeine and alcohol  Moderate elevation of the head of the bed   4. Hyperlipidemia, mixed Continue statin as ordered and reviewed, no changes at this time    Levora Dredge, MD  06/04/2023 12:10 PM

## 2023-06-04 NOTE — H&P (View-Only) (Signed)
MRN : [id number]  Clayton LANTIS, PhD is a 77 y.o. (1945-10-29) male who presents with chief complaint of check circulation.  History of Present Illness:   The patient returns to the office for followup of atherosclerotic changes of the lower extremities and review of the noninvasive studies.   The patient notes that there has been a significant deterioration in the lower extremity symptoms.  The patient notes interval shortening of their claudication distance and development of severe right leg rest pain symptoms. No new ulcers or wounds have occurred since the last visit.  There have been no significant changes to the patient's overall health care.  The patient denies amaurosis fugax or recent TIA symptoms. There are no recent neurological changes noted. There is no history of DVT, PE or superficial thrombophlebitis. The patient denies recent episodes of angina or shortness of breath.   ABI's Rt=0.80 (monophasic) and Lt=1.26 (triphasic) (previous ABI's Rt=1.10 and Lt=1.15)   Current Meds  Medication Sig   CALCIUM CITRATE PO Take 600 mg by mouth in the morning and at bedtime.   Cholecalciferol (VITAMIN D) 2000 UNITS CAPS Take 2,000 Units by mouth daily.   clopidogrel (PLAVIX) 75 MG tablet Take 1 tablet (75 mg total) by mouth daily.   Coenzyme Q10 200 MG capsule Take 200 mg by mouth daily.   Cyanocobalamin (VITAMIN B-12) 5000 MCG SUBL Place 5,000 mcg under the tongue daily.   dapagliflozin propanediol (FARXIGA) 10 MG TABS tablet Take 1 tablet (10 mg total) by mouth daily before breakfast.   icosapent Ethyl (VASCEPA) 1 g capsule Take 1 capsule (1 g total) by mouth 4 (four) times daily.   Multiple Vitamin (MULTIVITAMIN) capsule Take 1 capsule by mouth daily.   rosuvastatin (CRESTOR) 40 MG tablet Take 40 mg by mouth daily.   SYMBICORT 80-4.5 MCG/ACT inhaler Inhale 2 puffs into the lungs 2 (two) times daily as  needed (shortness of breath).   tadalafil (CIALIS) 5 MG tablet Take 5 mg by mouth daily.    Past Medical History:  Diagnosis Date   Allergy    Anginal pain (HCC)    Arthritis    BPH (benign prostatic hypertrophy) 12/2005   CAD (coronary artery disease) 2010   dx made by coronary calcium scoring, Normal stress test in 2010.    GERD (gastroesophageal reflux disease) 06/2004   HLD (hyperlipidemia) 09/1997   Left rotator cuff tear 08/12/2011   Per Dr. Dion Saucier 2013 L rotator cuff tear on MRI 2013    Peripheral vascular disease (HCC)    Right rotator cuff tear 05/24/2013   Wears glasses     Past Surgical History:  Procedure Laterality Date   admitted to [facility] chest pain  12/15/05   Dr. Juliann Pares   BLEPHAROPLASTY  10/11   Dr. Kemper Durie    bone spur removed Right    COLONOSCOPY  09/24/04   nml; Dr. Sharen Hint    CORONARY ARTERY BYPASS GRAFT N/A 05/29/2020   Procedure: CORONARY ARTERY BYPASS GRAFTING (CABG) TIMES THREE USING LIMA to LAD; ENDOSCOPIC HARVESTED RIGHT GREATER SAPHENOUS VEIN: SVG to PD; SVG to RAMUS.;  Surgeon: Delight Ovens, MD;  Location: [facility] OR;  Service: Open Heart Surgery;  Laterality: N/A;   EGD esophagitis by bx  01/26/06   gastritis bx neg h-pylori   ENDOVEIN HARVEST OF GREATER SAPHENOUS VEIN Right 05/29/2020   Procedure: ENDOVEIN HARVEST OF GREATER SAPHENOUS VEIN;  Surgeon: Delight Ovens, MD;  Location: [facility] OR;  Service: Open Heart Surgery;  Laterality: Right;   LEFT HEART CATH AND CORONARY ANGIOGRAPHY N/A 05/26/2020   Procedure: LEFT HEART CATH AND CORONARY ANGIOGRAPHY;  Surgeon: Lamar Blinks, MD;  Location: [facility] INVASIVE CV LAB;  Service: Cardiovascular;  Laterality: N/A;   LOWER EXTREMITY ANGIOGRAPHY Right 10/01/2020   Procedure: LOWER EXTREMITY ANGIOGRAPHY;  Surgeon: Annice Needy, MD;  Location: [facility] INVASIVE CV LAB;  Service: Cardiovascular;  Laterality: Right;   LOWER EXTREMITY ANGIOGRAPHY Right 08/12/2021   Procedure: LOWER EXTREMITY ANGIOGRAPHY;  Surgeon: Annice Needy, MD;  Location: [facility] INVASIVE CV LAB;  Service: Cardiovascular;  Laterality: Right;   LOWER EXTREMITY ANGIOGRAPHY Right 08/23/2021   Procedure: LOWER EXTREMITY ANGIOGRAPHY;  Surgeon: Annice Needy, MD;  Location: [facility] INVASIVE CV LAB;  Service: Cardiovascular;  Laterality: Right;   PARTIAL KNEE ARTHROPLASTY Left 09/06/2022   Procedure: UNICOMPARTMENTAL KNEE;  Surgeon: Teryl Lucy, MD;  Location: [facility] ORS;  Service: Orthopedics;  Laterality: Left;   POLYPECTOMY     right and left prostate needle bx  05/02/03   acute inflammation; Dr. Patsi Sears    SHOULDER ARTHROSCOPY W/ ROTATOR CUFF REPAIR  12/2011   left   SHOULDER ARTHROSCOPY WITH ROTATOR CUFF REPAIR AND SUBACROMIAL DECOMPRESSION Right 05/24/2013   Procedure: RIGHT SHOULDER ARTHROSCOPY WITH ROTATOR CUFF REPAIR AND SUBACROMIAL DECOMPRESSION AND PARTIAL ACROMIOPLSTY WITH CORACOACROMIAL RELEASE, DISTAL CLAVICULECTOMY, LABRIAL DEBRIDEMENT;  Surgeon: Eulas Post, MD;  Location: [facility];  Service: Orthopedics;  Laterality: Right;   spect ETT nml  12/16/05   TEE WITHOUT CARDIOVERSION N/A 05/29/2020   Procedure: TRANSESOPHAGEAL ECHOCARDIOGRAM (TEE);  Surgeon: Delight Ovens, MD;  Location: [facility] OR;  Service: Open Heart Surgery;  Laterality: N/A;   UPPER GASTROINTESTINAL ENDOSCOPY     WISDOM TOOTH EXTRACTION      Social History Social History   Tobacco Use   Smoking status: Never   Smokeless tobacco: Never  Vaping Use   Vaping status: Never Used  Substance Use Topics   Alcohol use: Yes    Alcohol/week: 0.0 standard drinks of alcohol    Comment: beer, occ   Drug use: No    Family History Family History  Problem Relation Age of Onset   Stroke Mother    Glaucoma Other        grandmother at 65   Barrett's esophagus Maternal Uncle    Prostate cancer Neg Hx    Colon cancer Neg Hx    Colon polyps Neg Hx    Esophageal cancer Neg Hx    Rectal cancer Neg Hx    Stomach cancer Neg Hx     Allergies  Allergen  Reactions   Ciprofloxacin Hcl Rash   Quinolones Rash     REVIEW OF SYSTEMS (Negative unless checked)  Constitutional: [] Weight loss  [] Fever  [] Chills Cardiac: [] Chest pain   [] Chest pressure   [] Palpitations   [] Shortness of breath when laying flat   [] Shortness of breath with exertion. Vascular:  [x] Pain in legs with walking   [] Pain in legs at rest  [] History of DVT   [] Phlebitis   [] Swelling in legs   [] Varicose veins   [] Non-healing  ulcers Pulmonary:   [] Uses home oxygen   [] Productive cough   [] Hemoptysis   [] Wheeze  [] COPD   [] Asthma Neurologic:  [] Dizziness   [] Seizures   [] History of stroke   [] History of TIA  [] Aphasia   [] Vissual changes   [] Weakness or numbness in arm   [] Weakness or numbness in leg Musculoskeletal:   [] Joint swelling   [] Joint pain   [] Low back pain Hematologic:  [] Easy bruising  [] Easy bleeding   [] Hypercoagulable state   [] Anemic Gastrointestinal:  [] Diarrhea   [] Vomiting  [] Gastroesophageal reflux/heartburn   [] Difficulty swallowing. Genitourinary:  [] Chronic kidney disease   [] Difficult urination  [] Frequent urination   [] Blood in urine Skin:  [] Rashes   [] Ulcers  Psychological:  [] History of anxiety   []  History of major depression.  Physical Examination  Vitals:   06/01/23 1408  BP: 128/83  Pulse: 73  Resp: 18  Weight: 179 lb (81.2 kg)  Height: 5\' 9"  (1.753 m)   Body mass index is 26.43 kg/m. Gen: WD/WN, NAD Head: [place]/AT, No temporalis wasting.  Ear/Nose/Throat: Hearing grossly intact, nares w/o erythema or drainage Eyes: PER, EOMI, sclera nonicteric.  Neck: Supple, no masses.  No bruit or JVD.  Pulmonary:  Good air movement, no audible wheezing, no use of accessory muscles.  Cardiac: RRR, normal S1, S2, no Murmurs. Vascular:  mild trophic changes, no open wounds Vessel Right Left  Radial Palpable Palpable  PT Not Palpable Not Palpable  DP Not Palpable Not Palpable  Gastrointestinal: soft, non-distended. No guarding/no peritoneal signs.   Musculoskeletal: M/S 5/5 throughout.  No visible deformity.  Neurologic: CN 2-12 intact. Pain and light touch intact in extremities.  Symmetrical.  Speech is fluent. Motor exam as listed above. Psychiatric: Judgment intact, Mood & affect appropriate for pt's clinical situation. Dermatologic: No rashes or ulcers noted.  No changes consistent with cellulitis.   CBC Lab Results  Component Value Date   WBC 4.8 05/13/2023   HGB 14.0 05/13/2023   HCT 43.0 05/13/2023   MCV 94.9 05/13/2023   PLT 261 05/13/2023    BMET    Component Value Date/Time   NA 139 04/21/2023 0829   NA 140 05/12/2012 1240   K 4.6 04/21/2023 0829   K 4.2 05/12/2012 1240   CL 104 04/21/2023 0829   CL 107 05/12/2012 1240   CO2 21 04/21/2023 0829   CO2 26 05/12/2012 1240   GLUCOSE 97 04/21/2023 0829   GLUCOSE 98 04/04/2023 0755   GLUCOSE 101 (H) 05/12/2012 1240   BUN 20 04/21/2023 0829   BUN 21 (H) 05/12/2012 1240   CREATININE 0.93 04/21/2023 0829   CREATININE 0.86 05/12/2012 1240   CALCIUM 9.8 04/21/2023 0829   CALCIUM 8.9 05/12/2012 1240   GFRNONAA >60 04/04/2023 0755   GFRNONAA >60 05/12/2012 1240   GFRAA >60 05/12/2012 1240   CrCl cannot be calculated (Patient's most recent lab result is older than the maximum 21 days allowed.).  COAG Lab Results  Component Value Date   INR 1.0 05/13/2023   INR 1.4 (H) 05/29/2020   INR 1.1 05/28/2020    Radiology VAS [place] ABI WITH/WO TBI  Result Date: 06/02/2023  LOWER EXTREMITY DOPPLER STUDY Patient Name:  ALBARA SCORZA Cuadra  Date of Exam:   06/01/2023 Medical Rec #: [id number]         Accession #:    [id number] Date of Birth: Sep 04, 1945        Patient Gender: M Patient Age:   69 years Exam Location:  [facility] Procedure:      VAS [place] ABI WITH/WO TBI Referring Phys: Sheppard Plumber --------------------------------------------------------------------------------  Indications: Peripheral artery disease.  Vascular Interventions: 08/23/21: Right SFA/popliteal  stent x2;. Comparison Study: 12/12/2022 Performing Technologist: Debbe Bales RVS  Examination Guidelines: A complete evaluation includes at minimum, Doppler waveform signals and systolic blood pressure reading at the level of bilateral brachial, anterior tibial, and posterior tibial arteries, when vessel segments are accessible. Bilateral testing is considered an integral part of a complete examination. Photoelectric Plethysmograph (PPG) waveforms and toe systolic pressure readings are included as required and additional duplex testing as needed. Limited examinations for reoccurring indications may be performed as noted.  ABI Findings: +---------+------------------+-----+-------------------+--------+ Right    Rt Pressure (mmHg)IndexWaveform           Comment  +---------+------------------+-----+-------------------+--------+ Brachial 132                                                +---------+------------------+-----+-------------------+--------+ ATA      75                0.56 dampened monophasic         +---------+------------------+-----+-------------------+--------+ PTA      107               0.80 biphasic                    +---------+------------------+-----+-------------------+--------+ PERO     72                0.54 dampened monophasic         +---------+------------------+-----+-------------------+--------+ Great Toe57                0.43 Abnormal                    +---------+------------------+-----+-------------------+--------+ +---------+------------------+-----+---------+-------+ Left     Lt Pressure (mmHg)IndexWaveform Comment +---------+------------------+-----+---------+-------+ Brachial 133                                     +---------+------------------+-----+---------+-------+ ATA      141               1.06 biphasic         +---------+------------------+-----+---------+-------+ PTA      167               1.26 triphasic         +---------+------------------+-----+---------+-------+ Glee Arvin               1.04 Normal           +---------+------------------+-----+---------+-------+ +-------+-----------+-----------+------------+------------+ ABI/TBIToday's ABIToday's TBIPrevious ABIPrevious TBI +-------+-----------+-----------+------------+------------+ Right  .80        .43        1.10        .85          +-------+-----------+-----------+------------+------------+ Left   1.26       1.04       1.15        1.03         +-------+-----------+-----------+------------+------------+  Left ABIs and TBIs appear essentially unchanged compared to prior study on 12/12/2022. Right ABIs and TBIs appear decreased compared to prior study on 12/12/2022.  Summary: Right: Resting right ankle-brachial index indicates mild right lower extremity arterial  disease. The right toe-brachial index is abnormal. Imaging and Waveforms obtained throughout in the Right Lower Extremity. The Right Lower Extremity Stent appears to be occluded from SFA Proximal to SFA Distal ; flow restored in Distal segment via collateral flow. Left: Resting left ankle-brachial index is within normal range. The left toe-brachial index is normal. *See table(s) above for measurements and observations.  Electronically signed by Festus Barren MD on 06/02/2023 at 9:01:50 AM.    Final      Assessment/Plan 1. Atherosclerosis of native artery of right lower extremity with rest pain (HCC) Recommend:  The patient has evidence of severe atherosclerotic changes of both lower extremities with rest pain that is associated with preulcerative changes and impending tissue loss of the rght foot.  This represents a limb threatening ischemia and places the patient at the risk for right limb loss.  Patient should undergo angiography of the right lower extremity with the hope for intervention for limb salvage.  The risks and benefits as well as the alternative therapies was discussed  in detail with the patient.  All questions were answered.  Patient agrees to proceed with right lower extremity angiography.  The patient will follow up with me in the office after the procedure.   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 Atherosclerosis of native coronary artery of native heart with angina pectoris (HCC) Continue cardiac and antihypertensive medications as already ordered and reviewed, no changes at this time.  Continue statin as ordered and reviewed, no changes at this time  Nitrates PRN for chest pain  3. Gastroesophageal reflux disease without esophagitis Continue PPI as already ordered, this medication has been reviewed and there are no changes at this time.  Avoidence of caffeine and alcohol  Moderate elevation of the head of the bed   4. Hyperlipidemia, mixed Continue statin as ordered and reviewed, no changes at this time    Levora Dredge, MD  06/04/2023 12:10 PM

## 2023-06-05 NOTE — Telephone Encounter (Signed)
Typically he will need to abstain from carrying anything heavier than 10 pounds for a week.  He should also stay away from strenuous activities for a week.

## 2023-06-05 NOTE — Telephone Encounter (Signed)
Message given, Patient still had a few questions about his surgery so, he was transferred to [facility]

## 2023-06-06 ENCOUNTER — Encounter: Payer: Self-pay | Admitting: Vascular Surgery

## 2023-06-06 ENCOUNTER — Ambulatory Visit
Admission: RE | Admit: 2023-06-06 | Discharge: 2023-06-06 | Disposition: A | Discharge status: Home / Self Care | Payer: Medicare Other | Attending: Vascular Surgery | Admitting: Vascular Surgery

## 2023-06-06 ENCOUNTER — Other Ambulatory Visit: Payer: Self-pay

## 2023-06-06 ENCOUNTER — Encounter
Admission: RE | Disposition: A | Discharge status: Home / Self Care | Payer: Self-pay | Source: Home / Self Care | Attending: Vascular Surgery

## 2023-06-06 DIAGNOSIS — I70229 Atherosclerosis of native arteries of extremities with rest pain, unspecified extremity: Secondary | ICD-10-CM

## 2023-06-06 DIAGNOSIS — I25119 Atherosclerotic heart disease of native coronary artery with unspecified angina pectoris: Secondary | ICD-10-CM | POA: Insufficient documentation

## 2023-06-06 DIAGNOSIS — T82856A Stenosis of peripheral vascular stent, initial encounter: Secondary | ICD-10-CM

## 2023-06-06 DIAGNOSIS — Z9889 Other specified postprocedural states: Secondary | ICD-10-CM | POA: Diagnosis not present

## 2023-06-06 DIAGNOSIS — E782 Mixed hyperlipidemia: Secondary | ICD-10-CM | POA: Diagnosis not present

## 2023-06-06 DIAGNOSIS — K219 Gastro-esophageal reflux disease without esophagitis: Secondary | ICD-10-CM | POA: Insufficient documentation

## 2023-06-06 DIAGNOSIS — Z79899 Other long term (current) drug therapy: Secondary | ICD-10-CM | POA: Insufficient documentation

## 2023-06-06 DIAGNOSIS — I700 Atherosclerosis of aorta: Secondary | ICD-10-CM | POA: Diagnosis not present

## 2023-06-06 DIAGNOSIS — I70221 Atherosclerosis of native arteries of extremities with rest pain, right leg: Secondary | ICD-10-CM | POA: Diagnosis present

## 2023-06-06 HISTORY — PX: LOWER EXTREMITY ANGIOGRAPHY: CATH118251

## 2023-06-06 LAB — BUN: BUN: 33 mg/dL — ABNORMAL HIGH (ref 8–23)

## 2023-06-06 LAB — CREATININE, SERUM
Creatinine, Ser: 1.03 mg/dL (ref 0.61–1.24)
GFR, Estimated: >60 mL/min (ref >60)

## 2023-06-06 SURGERY — LOWER EXTREMITY ANGIOGRAPHY
Anesthesia: Moderate Sedation | Site: Leg Lower | Laterality: Right

## 2023-06-06 MED ORDER — NITROGLYCERIN 1 MG/10 ML FOR IR/CATH LAB
INTRA_ARTERIAL | Status: AC
  Filled 2023-06-06: qty 10

## 2023-06-06 MED ORDER — MORPHINE SULFATE (PF) 4 MG/ML IV SOLN: 2.0000 mg | INTRAVENOUS | Status: DC | PRN

## 2023-06-06 MED ORDER — FAMOTIDINE 20 MG PO TABS: 40.0000 mg | ORAL_TABLET | Freq: Once | ORAL | Status: DC | PRN

## 2023-06-06 MED ORDER — SODIUM CHLORIDE 0.9% FLUSH
3.0000 mL | INTRAVENOUS | Status: DC | PRN
Start: 2023-06-06 — End: 2023-06-06

## 2023-06-06 MED ORDER — SODIUM CHLORIDE 0.9 % IV SOLN: INTRAVENOUS | Status: DC

## 2023-06-06 MED ORDER — CEFAZOLIN SODIUM-DEXTROSE 2-4 GM/100ML-% IV SOLN
INTRAVENOUS | Status: AC
  Filled 2023-06-06: qty 100

## 2023-06-06 MED ORDER — CEFAZOLIN SODIUM-DEXTROSE 2-4 GM/100ML-% IV SOLN
2.0000 g | INTRAVENOUS | Status: AC
  Administered 2023-06-06: 2 g via INTRAVENOUS

## 2023-06-06 MED ORDER — FENTANYL CITRATE PF 50 MCG/ML IJ SOSY
PREFILLED_SYRINGE | INTRAMUSCULAR | Status: AC
  Filled 2023-06-06: qty 1

## 2023-06-06 MED ORDER — HEPARIN SODIUM (PORCINE) 1000 UNIT/ML IJ SOLN
INTRAMUSCULAR | Status: AC
  Filled 2023-06-06: qty 10

## 2023-06-06 MED ORDER — HYDROMORPHONE HCL 1 MG/ML IJ SOLN: 1.0000 mg | Freq: Once | INTRAMUSCULAR | Status: DC | PRN

## 2023-06-06 MED ORDER — MIDAZOLAM HCL 2 MG/2ML IJ SOLN
INTRAMUSCULAR | Status: DC | PRN
  Administered 2023-06-06: 2 mg via INTRAVENOUS
  Administered 2023-06-06: 1 mg via INTRAVENOUS
  Administered 2023-06-06: .5 mg via INTRAVENOUS
  Administered 2023-06-06: 1 mg via INTRAVENOUS

## 2023-06-06 MED ORDER — HEPARIN (PORCINE) IN NACL 1000-0.9 UT/500ML-% IV SOLN
INTRAVENOUS | Status: DC | PRN
  Administered 2023-06-06: 1000 mL

## 2023-06-06 MED ORDER — SODIUM CHLORIDE 0.9 % IV SOLN
250.0000 mL | INTRAVENOUS | Status: DC | PRN
Start: 2023-06-06 — End: 2023-06-06

## 2023-06-06 MED ORDER — HEPARIN SODIUM (PORCINE) 1000 UNIT/ML IJ SOLN
INTRAMUSCULAR | Status: DC | PRN
  Administered 2023-06-06: 6000 [IU] via INTRAVENOUS

## 2023-06-06 MED ORDER — ONDANSETRON HCL 4 MG/2ML IJ SOLN: 4.0000 mg | Freq: Four times a day (QID) | INTRAMUSCULAR | Status: DC | PRN

## 2023-06-06 MED ORDER — NITROGLYCERIN 1 MG/10 ML FOR IR/CATH LAB
INTRA_ARTERIAL | Status: DC | PRN
  Administered 2023-06-06: 300 ug

## 2023-06-06 MED ORDER — DIPHENHYDRAMINE HCL 50 MG/ML IJ SOLN: 50.0000 mg | Freq: Once | INTRAMUSCULAR | Status: DC | PRN

## 2023-06-06 MED ORDER — FENTANYL CITRATE (PF) 100 MCG/2ML IJ SOLN
INTRAMUSCULAR | Status: DC | PRN
  Administered 2023-06-06 (×3): 25 ug via INTRAVENOUS
  Administered 2023-06-06: 50 ug via INTRAVENOUS

## 2023-06-06 MED ORDER — LIDOCAINE HCL (PF) 1 % IJ SOLN
INTRAMUSCULAR | Status: DC | PRN
  Administered 2023-06-06: 10 mL via INTRADERMAL

## 2023-06-06 MED ORDER — METHYLPREDNISOLONE SODIUM SUCC 125 MG IJ SOLR: 125.0000 mg | Freq: Once | INTRAMUSCULAR | Status: DC | PRN

## 2023-06-06 MED ORDER — FENTANYL CITRATE (PF) 100 MCG/2ML IJ SOLN
INTRAMUSCULAR | Status: AC
  Filled 2023-06-06: qty 2

## 2023-06-06 MED ORDER — ACETAMINOPHEN 325 MG PO TABS: 650.0000 mg | ORAL_TABLET | ORAL | Status: DC | PRN

## 2023-06-06 MED ORDER — MIDAZOLAM HCL 2 MG/ML PO SYRP: 8.0000 mg | ORAL_SOLUTION | Freq: Once | ORAL | Status: DC | PRN

## 2023-06-06 MED ORDER — OXYCODONE HCL 5 MG PO TABS
5.0000 mg | ORAL_TABLET | ORAL | Status: DC | PRN
Start: 2023-06-06 — End: 2023-06-06

## 2023-06-06 MED ORDER — SODIUM CHLORIDE 0.9% FLUSH: 3.0000 mL | Freq: Two times a day (BID) | INTRAVENOUS | Status: DC

## 2023-06-06 MED ORDER — IODIXANOL 320 MG/ML IV SOLN
INTRAVENOUS | Status: DC | PRN
  Administered 2023-06-06: 40 mL via INTRA_ARTERIAL

## 2023-06-06 MED ORDER — MIDAZOLAM HCL 5 MG/5ML IJ SOLN
INTRAMUSCULAR | Status: AC
  Filled 2023-06-06: qty 5

## 2023-06-06 SURGICAL SUPPLY — 18 items
CATH ANGIO 5F PIGTAIL 65CM (CATHETERS) IMPLANT
CATH USHER TPER 130CM (CATHETERS) IMPLANT
CATH VERT 5X100 (CATHETERS) IMPLANT
COVER PROBE ULTRASOUND 5X96 (MISCELLANEOUS) IMPLANT
DEVICE STARCLOSE SE CLOSURE (Vascular Products) IMPLANT
GLIDEWIRE ADV .035X260CM (WIRE) IMPLANT
GOWN STRL REUS W/ TWL LRG LVL3 (GOWN DISPOSABLE) ×1 IMPLANT
GOWN STRL REUS W/TWL LRG LVL3 (GOWN DISPOSABLE) ×1
NDL ENTRY 21GA 7CM ECHOTIP (NEEDLE) IMPLANT
NEEDLE ENTRY 21GA 7CM ECHOTIP (NEEDLE) ×1
PACK ANGIOGRAPHY (CUSTOM PROCEDURE TRAY) ×1 IMPLANT
SET INTRO CAPELLA COAXIAL (SET/KITS/TRAYS/PACK) IMPLANT
SHEATH ANL2 6FRX45 HC (SHEATH) IMPLANT
SHEATH BRITE TIP 5FRX11 (SHEATH) IMPLANT
SYR MEDRAD MARK 7 150ML (SYRINGE) IMPLANT
TUBING CONTRAST HIGH PRESS 72 (TUBING) IMPLANT
WIRE G V18X300CM (WIRE) IMPLANT
WIRE GUIDERIGHT .035X150 (WIRE) IMPLANT

## 2023-06-06 NOTE — Interval H&P Note (Signed)
History and Physical Interval Note:  06/06/2023 9:19 AM  Clayton Moris, PhD  has presented today for surgery, with the diagnosis of RLE Angio  ASO w rest pain.  The various methods of treatment have been discussed with the patient and family. After consideration of risks, benefits and other options for treatment, the patient has consented to  Procedure(s): Lower Extremity Angiography (Right) as a surgical intervention.  The patient's history has been reviewed, patient examined, no change in status, stable for surgery.  I have reviewed the patient's chart and labs.  Questions were answered to the patient's satisfaction.     Levora Dredge

## 2023-06-06 NOTE — Op Note (Signed)
[facility]  Percutaneous Study/Intervention Procedural Note   Date of Surgery: 06/06/2023,10:36 AM  Surgeon:Nikolina Simerson, Latina Craver   Pre-operative Diagnosis: Atherosclerotic occlusive disease bilateral lower extremities with rest pain  Post-operative diagnosis:  Same  Procedure(s) Performed:  1.  Abdominal aortogram  2.  Selective injection of the right lower extremity third order catheter placement  3.  Ultrasound-guided access to the left common femoral artery  4.  StarClose left femoral artery    Anesthesia: Conscious sedation was administered by the interventional radiology RN under my direct supervision. IV Versed plus fentanyl were utilized. Continuous ECG, pulse oximetry and blood pressure was monitored throughout the entire procedure.  Conscious sedation was administered for a total of 59 minutes.  Sheath: 5 French 11 cm Pinnacle sheath retrograde left common femoral  Contrast: 40 cc   Fluoroscopy Time: 4.0 minutes  Indications:  The patient presents to [facility] with atherosclerotic occlusive disease bilateral lower extremities with rest pain of the right foot.  Pedal pulses are nonpalpable on the right suggesting hemodynamically significant atherosclerotic occlusive disease.  He has had multiple interventions in the past.  This places him at increased risk for limb loss the risks and benefits as well as alternative therapies for lower extremity revascularization are reviewed with the patient all questions are answered the patient agrees to proceed.  The patient is therefore undergoing angiography with the hope for intervention for limb salvage.   Procedure:  Lianne Moris, PhDis a 77 y.o. male who was identified and appropriate procedural time out was performed.  The patient was then placed supine on the table and prepped and draped in the usual sterile fashion.  Ultrasound was used to evaluate the left common femoral artery.  It was echolucent  and pulsatile indicating it is patent .  An ultrasound image was acquired for the permanent record.  A micropuncture needle was used to access the left common femoral artery under direct ultrasound guidance.  The microwire was then advanced under fluoroscopic guidance without difficulty followed by the micro-sheath.  A 0.035 J wire was advanced without resistance and a 5Fr sheath was placed.    Pigtail catheter was then advanced to the level of T12 and AP projection of the aorta was obtained. Pigtail catheter was then repositioned to above the bifurcation and LAO view of the pelvis was obtained. Stiff angled Glidewire and pigtail catheter was then used across the bifurcation and the catheter was positioned in the distal external iliac artery.  RAO of the right groin was then obtained. Wire was reintroduced and negotiated into the SFA and the catheter was advanced into the SFA. Distal runoff was then performed.  After review of the images the catheter was removed over wire and an LAO view of the groin was obtained. StarClose device was deployed without difficulty.   Findings:   Aortogram: The abdominal aorta is opacified with a bolus injection contrast.  Single renal arteries are noted bilaterally with normal nephrograms.  No evidence of hemodynamically significant renal artery stenosis.  There are no hemodynamically significant stenoses identified within the aorta.  The aortic bifurcation is mildly diseased but widely patent.  Bilateral common internal and external iliac arteries are free of hemodynamically significant lesions.  Right lower Extremity: The right common femoral, profunda femoris arteries are widely patent.  The superficial femoral and popliteal arteries demonstrate previously placed stents essentially from the origin of the SFA to the distalmost popliteal.  Once I have crossed the occluded stents and  placed the catheter essentially at the level of the origin of the anterior tibial and  tibioperoneal trunk hand-injection of contrast shows there is 3 vessel runoff proximally after giving 300 mcg of nitroglycerin I am able to follow the posterior tibial there is entire length down to the foot and the plantar arteries.  This seems to be very slow and high resistive.  The peroneal also appears to be patent down to the level of the ankle.  The anterior tibial does not fill with contrast past its midportion to the foot.  SUMMARY: Based on these images no intervention is performed at this time.  Given that he has had 3 previous interventions in this distribution each with successively more stents placed I believe consideration for a femoral to tibioperoneal trunk bypass must be considered and discussed.  I will bring him back and obtain vein mapping so that a completely informed decision can be made.  It would certainly be possible to restent the previous intervention but it will almost certainly require extending down into the tibioperoneal trunk.    Disposition: Patient was taken to the recovery room in stable condition having tolerated the procedure well.  Earl Lites Riad Wagley 06/06/2023,10:36 AM

## 2023-06-07 ENCOUNTER — Encounter: Payer: Self-pay | Admitting: Vascular Surgery

## 2023-06-08 ENCOUNTER — Other Ambulatory Visit (INDEPENDENT_AMBULATORY_CARE_PROVIDER_SITE_OTHER): Payer: Self-pay | Admitting: Vascular Surgery

## 2023-06-08 DIAGNOSIS — I70221 Atherosclerosis of native arteries of extremities with rest pain, right leg: Secondary | ICD-10-CM

## 2023-06-08 DIAGNOSIS — Z01810 Encounter for preprocedural cardiovascular examination: Secondary | ICD-10-CM

## 2023-06-13 ENCOUNTER — Encounter: Payer: Self-pay | Admitting: Family Medicine

## 2023-06-13 ENCOUNTER — Encounter (INDEPENDENT_AMBULATORY_CARE_PROVIDER_SITE_OTHER): Payer: Medicare Other

## 2023-06-13 ENCOUNTER — Ambulatory Visit (INDEPENDENT_AMBULATORY_CARE_PROVIDER_SITE_OTHER): Payer: PRIVATE HEALTH INSURANCE | Admitting: Nurse Practitioner

## 2023-06-13 ENCOUNTER — Ambulatory Visit (INDEPENDENT_AMBULATORY_CARE_PROVIDER_SITE_OTHER): Payer: Medicare Other

## 2023-06-13 ENCOUNTER — Ambulatory Visit (INDEPENDENT_AMBULATORY_CARE_PROVIDER_SITE_OTHER): Payer: Medicare Other | Admitting: Family Medicine

## 2023-06-13 ENCOUNTER — Telehealth (INDEPENDENT_AMBULATORY_CARE_PROVIDER_SITE_OTHER): Payer: Self-pay | Admitting: Nurse Practitioner

## 2023-06-13 ENCOUNTER — Ambulatory Visit (INDEPENDENT_AMBULATORY_CARE_PROVIDER_SITE_OTHER): Payer: Medicare Other | Admitting: Nurse Practitioner

## 2023-06-13 ENCOUNTER — Encounter (INDEPENDENT_AMBULATORY_CARE_PROVIDER_SITE_OTHER): Payer: Self-pay | Admitting: Nurse Practitioner

## 2023-06-13 VITALS — BP 161/92 | HR 64 | Resp 18 | Ht 66.0 in | Wt 183.0 lb

## 2023-06-13 VITALS — BP 138/82 | HR 69 | Temp 98.6°F | Ht 67.0 in | Wt 183.0 lb

## 2023-06-13 DIAGNOSIS — Z01810 Encounter for preprocedural cardiovascular examination: Secondary | ICD-10-CM | POA: Diagnosis not present

## 2023-06-13 DIAGNOSIS — K8020 Calculus of gallbladder without cholecystitis without obstruction: Secondary | ICD-10-CM

## 2023-06-13 DIAGNOSIS — E782 Mixed hyperlipidemia: Secondary | ICD-10-CM

## 2023-06-13 DIAGNOSIS — I70211 Atherosclerosis of native arteries of extremities with intermittent claudication, right leg: Secondary | ICD-10-CM | POA: Diagnosis not present

## 2023-06-13 DIAGNOSIS — I70221 Atherosclerosis of native arteries of extremities with rest pain, right leg: Secondary | ICD-10-CM

## 2023-06-13 DIAGNOSIS — I10 Essential (primary) hypertension: Secondary | ICD-10-CM

## 2023-06-13 NOTE — Telephone Encounter (Signed)
Patient called stating [facility] told him to call if he decided that he wanted to do surgery. Was told date of 06/28/23. Wants to go ahead with scheduling surgery

## 2023-06-13 NOTE — Patient Instructions (Addendum)
Would follow up with vascular.   If you have abdominal pain, then let us know- especially if with a fatty meal.  Go to the lab on the way out.   If you have mychart we'll likely use that to update you.

## 2023-06-13 NOTE — Progress Notes (Unsigned)
He had imaging from chiropractor with opacity in the RUQ, presumed to be gall stones.  No abd pain, no bloating.  D/w pt about options. D/w pt about getting u/s done if symptomatic or if plan for surgery.  At this point it would be okay to defer.  He can update me as needed.  PAD d/w pt.  D/w pt that I can't give a second opinion re: vascular options.  He is cramping in his calf with walking.  Discussed rationale for intervention.  He had previous intervention but his situation is not resolved and the rationale for bypass proposed by vascular surgery was discussed with patient.  He had flu and RSV vaccines prev.  Meds, vitals, and allergies reviewed.   ROS: Per HPI unless specifically indicated in ROS section   Nad Ncat Neck supple, no LA Rrr Ctab Abdomen not tender, right upper quadrant not tender. Skin well-perfused but right foot with absent DP and PT pulse on exam with normal cap refill.

## 2023-06-14 ENCOUNTER — Other Ambulatory Visit (INDEPENDENT_AMBULATORY_CARE_PROVIDER_SITE_OTHER): Payer: Self-pay | Admitting: Vascular Surgery

## 2023-06-14 ENCOUNTER — Encounter (INDEPENDENT_AMBULATORY_CARE_PROVIDER_SITE_OTHER): Payer: Self-pay

## 2023-06-14 LAB — HEPATIC FUNCTION PANEL
ALT: 16 [IU]/L (ref 0–44)
AST: 24 [IU]/L (ref 0–40)
Albumin: 4.3 g/dL (ref 3.8–4.8)
Alkaline Phosphatase: 82 [IU]/L (ref 44–121)
Bilirubin Total: <0.2 mg/dL (ref 0.0–1.2)
Bilirubin, Direct: <0.08 mg/dL (ref 0.00–0.40)
Total Protein: 6.5 g/dL (ref 6.0–8.5)

## 2023-06-14 NOTE — Assessment & Plan Note (Signed)
I think it makes sense for the patient follow-up with vascular surgery.  We could get a second opinion set up if needed but I have confidence in his vascular surgery team and my concern is that awaiting a second opinion may be detrimental to his condition.  He understood.

## 2023-06-14 NOTE — Assessment & Plan Note (Signed)
Reasonable to check LFTs.  See notes on labs.

## 2023-06-15 NOTE — Progress Notes (Signed)
Subjective:    Patient ID: Clayton Moris, PhD, male    DOB: 27-Jun-1946, 77 y.o.   MRN: [id number] Chief Complaint  Patient presents with   Follow-up    1 week (06/13/2023); follow up after procedure with saphenous vein mapping bilaterally for possible bypass.    Clayton Powell is a 77 year old male who presents today after recent angiogram on 06/06/2023 for right lower extremity atherosclerosis and significant claudication.  The patient has had multiple interventions on the right lower extremity before.  However after this recent angiogram it was not amenable to endovascular intervention.  It was felt that it was in the best interest of the patient to stop the procedure and discuss possible surgical options.  Currently the patient has not experienced rest pain but he does have significant worsening claudication.  Based upon the angiogram he has occlusion of the SFA through the popliteal.  Ultimately it was decided that the best course of action will likely be a bypass from the femoral artery to the tibioperoneal trunk.  Today the patient underwent vein mapping.  He has minimal adequate great saphenous vein in the right that was used years ago for CABG.  However the left great saphenous vein appears to be adequate size.    Review of Systems  Cardiovascular:        Claudication  All other systems reviewed and are negative.      Objective:   Physical Exam Vitals reviewed.  HENT:     Head: Normocephalic.  Cardiovascular:     Rate and Rhythm: Normal rate.     Pulses:          Dorsalis pedis pulses are 1+ on the left side.       Posterior tibial pulses are 1+ on the left side.  Pulmonary:     Effort: Pulmonary effort is normal.  Skin:    General: Skin is warm and dry.  Neurological:     Mental Status: He is alert and oriented to person, place, and time.  Psychiatric:        Mood and Affect: Mood normal.        Behavior: Behavior normal.        Thought Content: Thought  content normal.        Judgment: Judgment normal.     BP (!) 161/92 (BP Location: Left Arm)   Pulse 64   Resp 18   Ht 5\' 6"  (1.676 m)   Wt 183 lb (83 kg)   BMI 29.54 kg/m   Past Medical History:  Diagnosis Date   Allergy    Anginal pain (HCC)    Arthritis    BPH (benign prostatic hypertrophy) 12/2005   CAD (coronary artery disease) 2010   dx made by coronary calcium scoring, Normal stress test in 2010.    GERD (gastroesophageal reflux disease) 06/2004   HLD (hyperlipidemia) 09/1997   Left rotator cuff tear 08/12/2011   Per Dr. Dion Saucier 2013 L rotator cuff tear on MRI 2013    Peripheral vascular disease (HCC)    Right rotator cuff tear 05/24/2013   Wears glasses     Social History   Socioeconomic History   Marital status: Married    Spouse name: Junious Dresser   Number of children: 2   Years of education: Not on file   Highest education level: Doctorate  Occupational History   Not on file  Tobacco Use   Smoking status: Never   Smokeless tobacco: Never  Vaping Use  Vaping status: Never Used  Substance and Sexual Activity   Alcohol use: Yes    Alcohol/week: 4.0 standard drinks of alcohol    Types: 4 Cans of beer per week    Comment: beer, occ   Drug use: No   Sexual activity: Yes  Other Topics Concern   Not on file  Social History Narrative   Married, 2 children - one is local   Married 40+ years   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    Plays guitar   Works out regularly.   Social Determinants of Health   Financial Resource Strain: Low Risk  (06/12/2023)   Overall Financial Resource Strain (CARDIA)    Difficulty of Paying Living Expenses: Not hard at all  Food Insecurity: No Food Insecurity (06/12/2023)   Hunger Vital Sign    Worried About Running Out of Food in the Last Year: Never true    Ran Out of Food in the Last Year: Never true  Transportation Needs: No Transportation Needs (06/12/2023)   PRAPARE - Administrator, Civil Service  (Medical): No    Lack of Transportation (Non-Medical): No  Physical Activity: Sufficiently Active (06/12/2023)   Exercise Vital Sign    Days of Exercise per Week: 3 days    Minutes of Exercise per Session: 60 min  Stress: No Stress Concern Present (06/12/2023)   [employer] of Occupational Health - Occupational Stress Questionnaire    Feeling of Stress : Not at all  Social Connections: Socially Integrated (06/12/2023)   Social Connection and Isolation Panel [NHANES]    Frequency of Communication with Friends and Family: More than three times a week    Frequency of Social Gatherings with Friends and Family: Three times a week    Attends Religious Services: 1 to 4 times per year    Active Member of Clubs or Organizations: No    Attends Banker Meetings: More than 4 times per year    Marital Status: Married  Catering manager Violence: Not At Risk (04/04/2023)   Humiliation, Afraid, Rape, and Kick questionnaire    Fear of Current or Ex-Partner: No    Emotionally Abused: No    Physically Abused: No    Sexually Abused: No    Past Surgical History:  Procedure Laterality Date   admitted to [facility] chest pain  12/15/05   Dr. Juliann Pares   BLEPHAROPLASTY  10/11   Dr. Kemper Durie    bone spur removed Right    COLONOSCOPY  09/24/04   nml; Dr. Sharen Hint    CORONARY ARTERY BYPASS GRAFT N/A 05/29/2020   Procedure: CORONARY ARTERY BYPASS GRAFTING (CABG) TIMES THREE USING LIMA to LAD; ENDOSCOPIC HARVESTED RIGHT GREATER SAPHENOUS VEIN: SVG to PD; SVG to RAMUS.;  Surgeon: Delight Ovens, MD;  Location: [facility] OR;  Service: Open Heart Surgery;  Laterality: N/A;   EGD esophagitis by bx  01/26/06   gastritis bx neg h-pylori   ENDOVEIN HARVEST OF GREATER SAPHENOUS VEIN Right 05/29/2020   Procedure: ENDOVEIN HARVEST OF GREATER SAPHENOUS VEIN;  Surgeon: Delight Ovens, MD;  Location: [facility] OR;  Service: Open Heart Surgery;  Laterality: Right;   LEFT HEART CATH AND CORONARY ANGIOGRAPHY N/A 05/26/2020    Procedure: LEFT HEART CATH AND CORONARY ANGIOGRAPHY;  Surgeon: Lamar Blinks, MD;  Location: [facility] IN[facility]IVE [facility];  Service: Cardiovascular;  Laterality: N/A;   LOWER EXTREMITY ANGIOGRAPHY Right 10/01/2020   Procedure: LOWER EXTREMITY ANGIOGRAPHY;  Surgeon: Annice Needy, MD;  Location:  [facility];  Service: Cardiovascular;  Laterality: Right;   LOWER EXTREMITY ANGIOGRAPHY Right 08/12/2021   Procedure: LOWER EXTREMITY ANGIOGRAPHY;  Surgeon: Annice Needy, MD;  Location: [facility];  Service: Cardiovascular;  Laterality: Right;   LOWER EXTREMITY ANGIOGRAPHY Right 08/23/2021   Procedure: LOWER EXTREMITY ANGIOGRAPHY;  Surgeon: Annice Needy, MD;  Location: [facility];  Service: Cardiovascular;  Laterality: Right;   LOWER EXTREMITY ANGIOGRAPHY Right 06/06/2023   Procedure: Lower Extremity Angiography;  Surgeon: Renford Dills, MD;  Location: [facility];  Service: Cardiovascular;  Laterality: Right;   PARTIAL KNEE ARTHROPLASTY Left 09/06/2022   Procedure: UNICOMPARTMENTAL KNEE;  Surgeon: Teryl Lucy, MD;  Location: [facility]S;  Service: Orthopedics;  Laterality: Left;   POLYPECTOMY     right and left prostate needle bx  05/02/03   acute inflammation; Dr. Patsi Sears    SHOULDER ARTHROSCOPY W/ ROTATOR CUFF REPAIR  12/2011   left   SHOULDER ARTHROSCOPY WITH ROTATOR CUFF REPAIR AND SUBACROMIAL DECOMPRESSION Right 05/24/2013   Procedure: RIGHT SHOULDER ARTHROSCOPY WITH ROTATOR CUFF REPAIR AND SUBACROMIAL DECOMPRESSION AND PARTIAL ACROMIOPLSTY WITH CORACOACROMIAL RELEASE, DISTAL CLAVICULECTOMY, LABRIAL DEBRIDEMENT;  Surgeon: Eulas Post, MD;  Location: [facility];  Service: Orthopedics;  Laterality: Right;   spect ETT nml  12/16/05   TEE WITHOUT CARDIOVERSION N/A 05/29/2020   Procedure: TRANSESOPHAGEAL ECHOCARDIOGRAM (TEE);  Surgeon: Delight Ovens, MD;  Location: [facility] OR;  Service: Open Heart Surgery;  Laterality: N/A;   UPPER GASTROINTESTINAL  ENDOSCOPY     WISDOM TOOTH EXTRACTION      Family History  Problem Relation Age of Onset   Stroke Mother    Glaucoma Other        grandmother at 4   Barrett's esophagus Maternal Uncle    Prostate cancer Neg Hx    Colon cancer Neg Hx    Colon polyps Neg Hx    Esophageal cancer Neg Hx    Rectal cancer Neg Hx    Stomach cancer Neg Hx     Allergies  Allergen Reactions   Ciprofloxacin Hcl Rash   Quinolones Rash       Latest Ref Rng & Units 05/13/2023    8:46 AM 04/04/2023    7:55 AM 08/26/2022    9:32 AM  CBC  WBC 4.0 - 10.5 K/uL 4.8  5.3  6.3   Hemoglobin 13.0 - 17.0 g/dL 40.9  81.1  91.4   Hematocrit 39.0 - 52.0 % 43.0  46.7  44.0   Platelets 150 - 400 K/uL 261  196  195       CMP     Component Value Date/Time   NA 139 04/21/2023 0829   NA 140 05/12/2012 1240   K 4.6 04/21/2023 0829   K 4.2 05/12/2012 1240   CL 104 04/21/2023 0829   CL 107 05/12/2012 1240   CO2 21 04/21/2023 0829   CO2 26 05/12/2012 1240   GLUCOSE 97 04/21/2023 0829   GLUCOSE 98 04/04/2023 0755   GLUCOSE 101 (H) 05/12/2012 1240   BUN 33 (H) 06/06/2023 0822   BUN 20 04/21/2023 0829   BUN 21 (H) 05/12/2012 1240   CREATININE 1.03 06/06/2023 0822   CREATININE 0.86 05/12/2012 1240   CALCIUM 9.8 04/21/2023 0829   CALCIUM 8.9 05/12/2012 1240   PROT 6.5 06/13/2023 1447   PROT 7.4 01/17/2012 0807   ALBUMIN 4.3 06/13/2023 1447   ALBUMIN 3.9 01/17/2012 0807   AST 24 06/13/2023 1447  AST 57 (H) 01/17/2012 0807   ALT 16 06/13/2023 1447   ALT 49 01/17/2012 0807   ALKPHOS 82 06/13/2023 1447   ALKPHOS 74 01/17/2012 0807   BILITOT <0.2 06/13/2023 1447   BILITOT 0.8 01/17/2012 0807   EGFR 85 04/21/2023 0829   GFRNONAA >60 06/06/2023 0822   GFRNONAA >60 05/12/2012 1240     VAS [place] ABI WITH/WO TBI  Result Date: 06/02/2023  LOWER EXTREMITY DOPPLER STUDY Patient Name:  AROLDO ROKUSEK Glidden  Date of Exam:   06/01/2023 Medical Rec #: [id number]         Accession #:    [id number] Date of Birth: 17-Oct-1945         Patient Gender: M Patient Age:   45 years Exam Location:  [facility] Procedure:      VAS [place] ABI WITH/WO TBI Referring Phys: Sheppard Plumber --------------------------------------------------------------------------------  Indications: Peripheral artery disease.  Vascular Interventions: 08/23/21: Right SFA/popliteal stent x2;. Comparison Study: 12/12/2022 Performing Technologist: Debbe Bales RVS  Examination Guidelines: A complete evaluation includes at minimum, Doppler waveform signals and systolic blood pressure reading at the level of bilateral brachial, anterior tibial, and posterior tibial arteries, when vessel segments are accessible. Bilateral testing is considered an integral part of a complete examination. Photoelectric Plethysmograph (PPG) waveforms and toe systolic pressure readings are included as required and additional duplex testing as needed. Limited examinations for reoccurring indications may be performed as noted.  ABI Findings: +---------+------------------+-----+-------------------+--------+ Right    Rt Pressure (mmHg)IndexWaveform           Comment  +---------+------------------+-----+-------------------+--------+ Brachial 132                                                +---------+------------------+-----+-------------------+--------+ ATA      75                0.56 dampened monophasic         +---------+------------------+-----+-------------------+--------+ PTA      107               0.80 biphasic                    +---------+------------------+-----+-------------------+--------+ PERO     72                0.54 dampened monophasic         +---------+------------------+-----+-------------------+--------+ Great Toe57                0.43 Abnormal                    +---------+------------------+-----+-------------------+--------+ +---------+------------------+-----+---------+-------+ Left     Lt Pressure (mmHg)IndexWaveform Comment  +---------+------------------+-----+---------+-------+ Brachial 133                                     +---------+------------------+-----+---------+-------+ ATA      141               1.06 biphasic         +---------+------------------+-----+---------+-------+ PTA      167               1.26 triphasic        +---------+------------------+-----+---------+-------+ Glee Arvin  1.04 Normal           +---------+------------------+-----+---------+-------+ +-------+-----------+-----------+------------+------------+ ABI/TBIToday's ABIToday's TBIPrevious ABIPrevious TBI +-------+-----------+-----------+------------+------------+ Right  .80        .43        1.10        .85          +-------+-----------+-----------+------------+------------+ Left   1.26       1.04       1.15        1.03         +-------+-----------+-----------+------------+------------+  Left ABIs and TBIs appear essentially unchanged compared to prior study on 12/12/2022. Right ABIs and TBIs appear decreased compared to prior study on 12/12/2022.  Summary: Right: Resting right ankle-brachial index indicates mild right lower extremity arterial disease. The right toe-brachial index is abnormal. Imaging and Waveforms obtained throughout in the Right Lower Extremity. The Right Lower Extremity Stent appears to be occluded from SFA Proximal to SFA Distal ; flow restored in Distal segment via collateral flow. Left: Resting left ankle-brachial index is within normal range. The left toe-brachial index is normal. *See table(s) above for measurements and observations.  Electronically signed by Festus Barren MD on 06/02/2023 at 9:01:50 AM.    Final        Assessment & Plan:   1. Atherosclerosis of native artery of right lower extremity with intermittent claudication (HCC)  Recommend:  The patient has evidence of severe atherosclerotic changes of the right lower extremity and significant  claudication.  Angiography has been performed and the situation is not ideal for intervention.  Given this finding open surgical repair is recommended.   Patient should undergo arterial reconstruction, with a right lower extremity femoral to tibialperoneal bypass with left lower extremity great saphenous vein harvest. with the hope for limb salvage.  The risks and benefits as well as the alternative therapies was discussed in detail with the patient.  All questions were answered.  Patient agrees to proceed with open vascular surgical reconstruction.  The patient will follow up with me in the office after the procedure.    2. Primary hypertension Continue antihypertensive medications as already ordered, these medications have been reviewed and there are no changes at this time.  3. Hyperlipidemia, mixed Continue statin as ordered and reviewed, no changes at this time   Current Outpatient Medications on File Prior to Visit  Medication Sig Dispense Refill   acetaminophen (TYLENOL) 500 MG tablet Take 1,000 mg by mouth every 6 (six) hours as needed for moderate pain or mild pain.     Azelastine-Fluticasone 137-50 MCG/ACT SUSP Place 1 spray into both nostrils daily as needed (Congestion).     CALCIUM CITRATE PO Take 600 mg by mouth in the morning and at bedtime.     Cholecalciferol (VITAMIN D) 2000 UNITS CAPS Take 2,000 Units by mouth daily.     Cyanocobalamin (VITAMIN B-12) 5000 MCG SUBL Place 5,000 mcg under the tongue daily.     dapagliflozin propanediol (FARXIGA) 10 MG TABS tablet Take 1 tablet (10 mg total) by mouth daily before breakfast. 90 tablet 3   icosapent Ethyl (VASCEPA) 1 g capsule Take 1 capsule (1 g total) by mouth 4 (four) times daily. 120 capsule 10   Multiple Vitamin (MULTIVITAMIN) capsule Take 1 capsule by mouth daily.     rosuvastatin (CRESTOR) 40 MG tablet Take 40 mg by mouth daily.     SYMBICORT 80-4.5 MCG/ACT inhaler Inhale 2 puffs into the lungs 2 (two) times daily as  needed (shortness of  breath).     tadalafil (CIALIS) 5 MG tablet Take 5 mg by mouth daily.     No current facility-administered medications on file prior to visit.    There are no Patient Instructions on file for this visit. No follow-ups on file.   Georgiana Spinner, NP

## 2023-07-03 ENCOUNTER — Telehealth: Payer: Self-pay | Admitting: Internal Medicine

## 2023-07-03 NOTE — Telephone Encounter (Signed)
I s/w the pt and I explained that I have not seen a request come over for him for surgery. Pt gave me the of the surgeon's doing his surgery. I then s/w [facility]. I explained that I have not seen a clearance request come over. I was told by the surgeon office that she did not fax it, that she sent it through the chart as we are all [facility]. I still did not see anything and ask for the protocol to be followed which is to fax the request to [phone].   Request was received.      Pre-operative Risk Assessment    Patient Name: Clayton PAKER, PhD  DOB: 04/25/1946 MRN: [id number]  DATE OF LAST VISIT: 06/02/23 DR. HILTY DATE OF NEXT VISIT: NONE    Request for Surgical Clearance    Procedure:   RIGHT FEM-TIB BYPASS W/LLE VEIN HARVEST  Date of Surgery:  Clearance TBD                                 Surgeon: DR. Levora Dredge Surgeon's Group or Practice Name:  [facility] Phone number:  [phone] Fax number:  [phone]   Type of Clearance Requested:   - Medical ; NONE INDICATED TO BE HELD   Type of Anesthesia:  Not Indicated (GENERAL?)   Additional requests/questions:    Elpidio Anis   07/03/2023, 3:27 PM

## 2023-07-03 NOTE — Telephone Encounter (Signed)
Patient states he needs clearance for leg bypass surgery and [facility] sent a clearance request about 3 weeks ago. Patient is requesting updates. They will not schedule procedure until clearance recommendation is received.

## 2023-07-03 NOTE — Telephone Encounter (Signed)
Preop team, I do not see a request for clearance in patients chart. Can someone please reach out to [facility]?  Thank you, Reather Littler, NP

## 2023-07-03 NOTE — Telephone Encounter (Signed)
Dr. Rennis Golden, You saw this patient on 06/02/23, see attached request for right fem-tib bypass with LLE vein harvest. Are you able to advise on surgical clearance for right fem-tib bypass? Please route your response to P [facility] DIV PREOP. Thank you!  Reather Littler, NP

## 2023-07-03 NOTE — Telephone Encounter (Signed)
Ok to proceed with PV procedure - although he had a mildly abnormal cardiac PET, he is asymptomatic - mildly reduced LVEF, no HF symptoms recently in the office- ok to proceed from a cardiac standpoint.  Dr. Rennis Golden

## 2023-07-04 ENCOUNTER — Telehealth (INDEPENDENT_AMBULATORY_CARE_PROVIDER_SITE_OTHER): Payer: Self-pay

## 2023-07-04 NOTE — Telephone Encounter (Signed)
   Primary Cardiologist: Chrystie Nose, MD  Chart reviewed as part of pre-operative protocol coverage. Lianne Moris, PhD was seen by Dr. Rennis Golden on 06/02/2023. Given past medical history and time since last visit, based on ACC/AHA guidelines, he is felt to be at acceptable risk for the planned procedure without further cardiovascular testing.  Patient was advised that if he develops new symptoms prior to surgery to contact our office to arrange a follow-up appointment.  He verbalized understanding.  There is no request to hold cardiac medications.   I will route this recommendation to the requesting party via Epic fax function and remove from pre-op pool.  Please call with questions.  Levi Aland, NP-C 07/04/2023, 8:35 AM [address] Office [phone] Fax [phone]

## 2023-07-04 NOTE — Telephone Encounter (Signed)
Spoke with the patient and he is scheduled on 08/02/23 for a right fem-tib bypass with Dr. Gilda Crease. Pre-op is scheduled on 07/27/23 at 1:00 pm at the [facility]. Pre-surgical instructions will be sent to Mychart and mailed.

## 2023-07-18 ENCOUNTER — Other Ambulatory Visit: Payer: Self-pay | Admitting: Internal Medicine

## 2023-07-24 ENCOUNTER — Telehealth: Payer: Self-pay | Admitting: Internal Medicine

## 2023-07-24 MED ORDER — ROSUVASTATIN CALCIUM 40 MG PO TABS: 40.0000 mg | ORAL_TABLET | Freq: Every day | ORAL | 3 refills | Status: DC

## 2023-07-24 NOTE — Telephone Encounter (Signed)
Refills has been sent to the pharmacy. 

## 2023-07-24 NOTE — Telephone Encounter (Signed)
*  STAT* If patient is at the pharmacy, call can be transferred to refill team.   1. Which medications need to be refilled? (please list name of each medication and dose if known)  rosuvastatin (CRESTOR) 40 MG tablet  2. Which pharmacy/location (including street and city if local pharmacy) is medication to be sent to? [id number] - [place], [place] - [address]  3. Do they need a 30 day or 90 day supply?  90 day supply

## 2023-07-27 ENCOUNTER — Other Ambulatory Visit (INDEPENDENT_AMBULATORY_CARE_PROVIDER_SITE_OTHER): Payer: Self-pay | Admitting: Nurse Practitioner

## 2023-07-27 ENCOUNTER — Encounter
Admission: RE | Admit: 2023-07-27 | Discharge: 2023-07-27 | Disposition: A | Discharge status: Home / Self Care | Payer: Medicare Other | Source: Ambulatory Visit | Attending: Vascular Surgery | Admitting: Vascular Surgery

## 2023-07-27 ENCOUNTER — Other Ambulatory Visit: Payer: Self-pay

## 2023-07-27 VITALS — BP 162/90 | HR 64 | Temp 98.3°F | Resp 18 | Ht 66.0 in | Wt 191.8 lb

## 2023-07-27 DIAGNOSIS — Z01818 Encounter for other preprocedural examination: Secondary | ICD-10-CM | POA: Diagnosis present

## 2023-07-27 DIAGNOSIS — Z01812 Encounter for preprocedural laboratory examination: Secondary | ICD-10-CM | POA: Insufficient documentation

## 2023-07-27 DIAGNOSIS — I70221 Atherosclerosis of native arteries of extremities with rest pain, right leg: Secondary | ICD-10-CM

## 2023-07-27 HISTORY — DX: Atherosclerosis of native arteries of extremities with intermittent claudication, unspecified extremity: I70.219

## 2023-07-27 HISTORY — DX: Sensorineural hearing loss, bilateral: H90.3

## 2023-07-27 HISTORY — DX: Unspecified eustachian tube disorder, unspecified ear: H69.90

## 2023-07-27 HISTORY — DX: Calculus of gallbladder without cholecystitis without obstruction: K80.20

## 2023-07-27 HISTORY — DX: Noninfective gastroenteritis and colitis, unspecified: K52.9

## 2023-07-27 HISTORY — DX: Retention of urine, unspecified: R33.9

## 2023-07-27 HISTORY — DX: Unstable angina: I20.0

## 2023-07-27 HISTORY — DX: Metabolic syndrome: E88.810

## 2023-07-27 HISTORY — DX: Chronic prostatitis: N41.1

## 2023-07-27 HISTORY — DX: Other somatoform disorders: F45.8

## 2023-07-27 HISTORY — DX: Mixed hyperlipidemia: E78.2

## 2023-07-27 HISTORY — DX: Vasomotor rhinitis: J30.0

## 2023-07-27 HISTORY — DX: Other specified symptoms and signs involving the circulatory and respiratory systems: R09.89

## 2023-07-27 LAB — BASIC METABOLIC PANEL
Anion gap: 10 (ref 5–15)
BUN: 25 mg/dL — ABNORMAL HIGH (ref 8–23)
CO2: 24 mmol/L (ref 22–32)
Calcium: 8.8 mg/dL — ABNORMAL LOW (ref 8.9–10.3)
Chloride: 102 mmol/L (ref 98–111)
Creatinine, Ser: 0.85 mg/dL (ref 0.61–1.24)
GFR, Estimated: >60 mL/min (ref >60)
Glucose, Bld: 140 mg/dL — ABNORMAL HIGH (ref 70–99)
Potassium: 4.3 mmol/L (ref 3.5–5.1)
Sodium: 136 mmol/L (ref 135–145)

## 2023-07-27 LAB — CBC WITH DIFFERENTIAL/PLATELET
Abs Immature Granulocytes: 0.02 10*3/uL (ref 0.00–0.07)
Basophils Absolute: 0 10*3/uL (ref 0.0–0.1)
Basophils Relative: 0 %
Eosinophils Absolute: 0.3 10*3/uL (ref 0.0–0.5)
Eosinophils Relative: 5 %
HCT: 41 % (ref 39.0–52.0)
Hemoglobin: 13.8 g/dL (ref 13.0–17.0)
Immature Granulocytes: 0 %
Lymphocytes Relative: 25 %
Lymphs Abs: 1.8 10*3/uL (ref 0.7–4.0)
MCH: 29.5 pg (ref 26.0–34.0)
MCHC: 33.7 g/dL (ref 30.0–36.0)
MCV: 87.6 fL (ref 80.0–100.0)
Monocytes Absolute: 0.7 10*3/uL (ref 0.1–1.0)
Monocytes Relative: 9 %
Neutro Abs: 4.4 10*3/uL (ref 1.7–7.7)
Neutrophils Relative %: 61 %
Platelets: 215 10*3/uL (ref 150–400)
RBC: 4.68 MIL/uL (ref 4.22–5.81)
RDW: 13.8 % (ref 11.5–15.5)
WBC: 7.2 10*3/uL (ref 4.0–10.5)
nRBC: 0 % (ref 0.0–0.2)

## 2023-07-27 LAB — TYPE AND SCREEN
ABO/RH(D): O POS
Antibody Screen: NEGATIVE

## 2023-07-27 LAB — SURGICAL PCR SCREEN
MRSA, PCR: NEGATIVE
Staphylococcus aureus: NEGATIVE

## 2023-07-27 NOTE — Patient Instructions (Addendum)
 Your procedure is scheduled on:  Wednesday January 8  Report to the Registration Desk on the 1st floor of the [facility] To find out your arrival time, please call [phone] between 1PM - 3PM on:  Tuesday January 7  If your arrival time is 6:00 am, do not arrive before that time as the [facility] entrance doors do not open until 6:00 am.  REMEMBER: Instructions that are not followed completely may result in serious medical risk, up to and including death; or upon the discretion of your surgeon and anesthesiologist your surgery may need to be rescheduled.  Do not eat food after midnight the night before surgery.  No gum chewing or hard candies.   One week prior to surgery: Wednesday January 1  Stop Anti-inflammatories (NSAIDS) such as Advil, Aleve, Ibuprofen, Motrin, Naproxen, Naprosyn and Aspirin  based products such as Excedrin, Goody's Powder, BC Powder. Stop ANY OVER THE COUNTER supplements until after surgery. CALCIUM  CITRATE   You may however, continue to take Tylenol  if needed for pain up until the day of surgery.  Continue taking all of your other prescription medications up until the day of surgery.  ON THE DAY OF SURGERY ONLY TAKE THESE MEDICATIONS WITH SIPS OF WATER :  Cholecalciferol  (VITAMIN D )  Cyanocobalamin  (VITAMIN B-12)  icosapent  Ethyl (VASCEPA )  Multiple Vitamin (MULTIVITAMIN)  rosuvastatin  (CRESTOR )   Use inhalers on the day of surgery and bring to the hospital. SYMBICORT   No Alcohol for 24 hours before or after surgery.  No Smoking including e-cigarettes for 24 hours before surgery.  No chewable tobacco products for at least 6 hours before surgery.  No nicotine patches on the day of surgery.  Do not use any recreational drugs for at least a week (preferably 2 weeks) before your surgery.  Please be advised that the combination of cocaine and anesthesia may have negative outcomes, up to and including death. If you test positive for cocaine, your  surgery will be cancelled.  On the morning of surgery brush your teeth with toothpaste and water , you may rinse your mouth with mouthwash if you wish. Do not swallow any toothpaste or mouthwash.  Use CHG Soap as directed on instruction sheet.  Do not wear jewelry, make-up, hairpins, clips or nail polish.  For welded (permanent) jewelry: bracelets, anklets, waist bands, etc.  Please have this removed prior to surgery.  If it is not removed, there is a chance that hospital personnel will need to cut it off on the day of surgery.  Do not wear lotions, powders, or perfumes.   Do not shave body hair from the neck down 48 hours before surgery.  Contact lenses, hearing aids and dentures may not be worn into surgery.  Do not bring valuables to the hospital. [facility] is not responsible for any missing/lost belongings or valuables.   Notify your doctor if there is any change in your medical condition (cold, fever, infection).  Wear comfortable clothing (specific to your surgery type) to the hospital.  After surgery, you can help prevent lung complications by doing breathing exercises.  Take deep breaths and cough every 1-2 hours. Your doctor may order a device called an Incentive Spirometer to help you take deep breaths.  If you are being admitted to the hospital overnight, leave your suitcase in the car. After surgery it may be brought to your room.  In case of increased patient census, it may be necessary for you, the patient, to continue your postoperative care in the  Same Day Surgery department.  If you are being discharged the day of surgery, you will not be allowed to drive home. You will need a responsible individual to drive you home and stay with you for 24 hours after surgery.   If you are taking public transportation, you will need to have a responsible individual with you.  Please call the Pre-admissions Testing Dept. at [phone] if you have any questions about these  instructions.  Surgery Visitation Policy:  Patients having surgery or a procedure may have two visitors.  Children under the age of 62 must have an adult with them who is not the patient.  Inpatient Visitation:    Visiting hours are 7 a.m. to 8 p.m. Up to four visitors are allowed at one time in a patient room. The visitors may rotate out with other people during the day.  One visitor age 59 or older may stay with the patient overnight and must be in the room by 8 p.m.             Preparing for Surgery with CHLORHEXIDINE  GLUCONATE (CHG) Soap  Chlorhexidine  Gluconate (CHG) Soap  o An antiseptic cleaner that kills germs and bonds with the skin to continue killing germs even after washing  o Used for showering the night before surgery and morning of surgery  Before surgery, you can play an important role by reducing the number of germs on your skin.  CHG (Chlorhexidine  gluconate) soap is an antiseptic cleanser which kills germs and bonds with the skin to continue killing germs even after washing.  Please do not use if you have an allergy to CHG or antibacterial soaps. If your skin becomes reddened/irritated stop using the CHG.  1. Shower the NIGHT BEFORE SURGERY and the MORNING OF SURGERY with CHG soap.  2. If you choose to wash your hair, wash your hair first as usual with your normal shampoo.  3. After shampooing, rinse your hair and body thoroughly to remove the shampoo.  4. Use CHG as you would any other liquid soap. You can apply CHG directly to the skin and wash gently with a scrungie or a clean washcloth.  5. Apply the CHG soap to your body only from the neck down. Do not use on open wounds or open sores. Avoid contact with your eyes, ears, mouth, and genitals (private parts). Wash face and genitals (private parts) with your normal soap.  6. Wash thoroughly, paying special attention to the area where your surgery will be performed.  7. Thoroughly rinse your body  with warm water .  8. Do not shower/wash with your normal soap after using and rinsing off the CHG soap.  9. Pat yourself dry with a clean towel.  10. Wear clean pajamas to bed the night before surgery.  11.. Place clean sheets on your bed the night of your first shower and do not sleep with pets.  12.. Shower again with the CHG soap on the day of surgery prior to arriving at the hospital.  13.. Do not apply any deodorants/lotions/powders.  14. Please wear clean clothes to the hospital.

## 2023-07-28 NOTE — Progress Notes (Signed)
 Cardiac Clearance in Epic on 07-03-23 from Dr Mona as stated below:  Ok to proceed with PV procedure - although he had a mildly abnormal cardiac PET, he is asymptomatic - mildly reduced LVEF, no HF symptoms recently in the office- ok to proceed from a cardiac standpoint.   Dr. Mona   Clearance from Dr Katharina NP, Rosaline Bane on 07-04-23 as stated below:     Signed          Primary Cardiologist: Vinie JAYSON Mona, MD   Chart reviewed as part of pre-operative protocol coverage. [name], PhD was seen by Dr. Mona on 06/02/2023. Given past medical history and time since last visit, based on ACC/AHA guidelines, he is felt to be at acceptable risk for the planned procedure without further cardiovascular testing.   Patient was advised that if he develops new symptoms prior to surgery to contact our office to arrange a follow-up appointment.  He verbalized understanding.   There is no request to hold cardiac medications.    I will route this recommendation to the requesting party via Epic fax function and remove from pre-op pool.   Please call with questions.   Rosaline EMERSON Bane, NP-C 07/04/2023, 8:35 AM [address], [address] Office [phone] Fax [phone]              Electronically signed by Bane Rosaline CHRISTELLA, NP at 07/04/2023  8:37 AM

## 2023-08-02 ENCOUNTER — Inpatient Hospital Stay: Payer: Medicare Other | Admitting: Certified Registered"

## 2023-08-02 ENCOUNTER — Encounter: Payer: Self-pay | Admitting: Vascular Surgery

## 2023-08-02 ENCOUNTER — Other Ambulatory Visit: Payer: Self-pay

## 2023-08-02 ENCOUNTER — Encounter
Admission: RE | Disposition: A | Discharge status: Home Health | Payer: Self-pay | Source: Home / Self Care | Attending: Vascular Surgery

## 2023-08-02 ENCOUNTER — Inpatient Hospital Stay
Admission: RE | Admit: 2023-08-02 | Discharge: 2023-08-07 | DRG: 254 | Disposition: A | Discharge status: Home Health | Payer: Medicare Other | Attending: Vascular Surgery | Admitting: Vascular Surgery

## 2023-08-02 DIAGNOSIS — H903 Sensorineural hearing loss, bilateral: Secondary | ICD-10-CM | POA: Diagnosis present

## 2023-08-02 DIAGNOSIS — I70221 Atherosclerosis of native arteries of extremities with rest pain, right leg: Principal | ICD-10-CM | POA: Diagnosis present

## 2023-08-02 DIAGNOSIS — Z7951 Long term (current) use of inhaled steroids: Secondary | ICD-10-CM

## 2023-08-02 DIAGNOSIS — N400 Enlarged prostate without lower urinary tract symptoms: Secondary | ICD-10-CM | POA: Diagnosis present

## 2023-08-02 DIAGNOSIS — K219 Gastro-esophageal reflux disease without esophagitis: Secondary | ICD-10-CM | POA: Diagnosis present

## 2023-08-02 DIAGNOSIS — E782 Mixed hyperlipidemia: Secondary | ICD-10-CM | POA: Diagnosis present

## 2023-08-02 DIAGNOSIS — I2510 Atherosclerotic heart disease of native coronary artery without angina pectoris: Secondary | ICD-10-CM | POA: Diagnosis present

## 2023-08-02 DIAGNOSIS — Z7982 Long term (current) use of aspirin: Secondary | ICD-10-CM | POA: Diagnosis not present

## 2023-08-02 DIAGNOSIS — Z79899 Other long term (current) drug therapy: Secondary | ICD-10-CM

## 2023-08-02 DIAGNOSIS — Z96652 Presence of left artificial knee joint: Secondary | ICD-10-CM | POA: Diagnosis present

## 2023-08-02 DIAGNOSIS — Z881 Allergy status to other antibiotic agents status: Secondary | ICD-10-CM

## 2023-08-02 DIAGNOSIS — Z7902 Long term (current) use of antithrombotics/antiplatelets: Secondary | ICD-10-CM

## 2023-08-02 DIAGNOSIS — I10 Essential (primary) hypertension: Secondary | ICD-10-CM | POA: Diagnosis present

## 2023-08-02 DIAGNOSIS — Z888 Allergy status to other drugs, medicaments and biological substances status: Secondary | ICD-10-CM | POA: Diagnosis not present

## 2023-08-02 DIAGNOSIS — Z951 Presence of aortocoronary bypass graft: Secondary | ICD-10-CM | POA: Diagnosis not present

## 2023-08-02 DIAGNOSIS — Z823 Family history of stroke: Secondary | ICD-10-CM

## 2023-08-02 DIAGNOSIS — I70229 Atherosclerosis of native arteries of extremities with rest pain, unspecified extremity: Principal | ICD-10-CM | POA: Diagnosis present

## 2023-08-02 HISTORY — PX: FEMORAL-TIBIAL BYPASS GRAFT: SHX938

## 2023-08-02 HISTORY — PX: VEIN HARVEST: SHX6363

## 2023-08-02 LAB — CBC
HCT: 32.2 % — ABNORMAL LOW (ref 39.0–52.0)
HCT: 40.9 % (ref 39.0–52.0)
Hemoglobin: 10.8 g/dL — ABNORMAL LOW (ref 13.0–17.0)
Hemoglobin: 13.3 g/dL (ref 13.0–17.0)
MCH: 29.7 pg (ref 26.0–34.0)
MCH: 30 pg (ref 26.0–34.0)
MCHC: 32.5 g/dL (ref 30.0–36.0)
MCHC: 33.5 g/dL (ref 30.0–36.0)
MCV: 89.4 fL (ref 80.0–100.0)
MCV: 91.3 fL (ref 80.0–100.0)
Platelets: 201 10*3/uL (ref 150–400)
Platelets: 219 10*3/uL (ref 150–400)
RBC: 3.6 MIL/uL — ABNORMAL LOW (ref 4.22–5.81)
RBC: 4.48 MIL/uL (ref 4.22–5.81)
RDW: 14 % (ref 11.5–15.5)
RDW: 14.1 % (ref 11.5–15.5)
WBC: 10.2 10*3/uL (ref 4.0–10.5)
WBC: 14 10*3/uL — ABNORMAL HIGH (ref 4.0–10.5)
nRBC: 0 % (ref 0.0–0.2)
nRBC: 0 % (ref 0.0–0.2)

## 2023-08-02 LAB — CREATININE, SERUM
Creatinine, Ser: 0.91 mg/dL (ref 0.61–1.24)
GFR, Estimated: >60 mL/min (ref >60)

## 2023-08-02 SURGERY — CREATION, BYPASS, ARTERIAL, FEMORAL TO TIBIAL, USING GRAFT
Anesthesia: General | Site: Leg Lower | Laterality: Right

## 2023-08-02 MED ORDER — BUPIVACAINE LIPOSOME 1.3 % IJ SUSP
INTRAMUSCULAR | Status: AC
  Filled 2023-08-02: qty 20

## 2023-08-02 MED ORDER — SODIUM CHLORIDE (PF) 0.9 % IJ SOLN
INTRAMUSCULAR | Status: DC | PRN
  Administered 2023-08-02: 500 mL via INTRAVENOUS

## 2023-08-02 MED ORDER — MAGNESIUM SULFATE 2 GM/50ML IV SOLN: 2.0000 g | Freq: Every day | INTRAVENOUS | Status: DC | PRN

## 2023-08-02 MED ORDER — ORAL CARE MOUTH RINSE: 15.0000 mL | Freq: Once | OROMUCOSAL | Status: AC

## 2023-08-02 MED ORDER — AZELASTINE HCL 0.1 % NA SOLN: 1.0000 | Freq: Every day | NASAL | Status: DC | PRN

## 2023-08-02 MED ORDER — PHENOL 1.4 % MT LIQD: 1.0000 | OROMUCOSAL | Status: DC | PRN

## 2023-08-02 MED ORDER — FENTANYL CITRATE (PF) 100 MCG/2ML IJ SOLN: 25.0000 ug | INTRAMUSCULAR | Status: DC | PRN

## 2023-08-02 MED ORDER — SENNOSIDES-DOCUSATE SODIUM 8.6-50 MG PO TABS: 1.0000 | ORAL_TABLET | Freq: Every evening | ORAL | Status: DC | PRN

## 2023-08-02 MED ORDER — HEPARIN 30,000 UNITS/1000 ML (OHS) CELLSAVER SOLUTION
Status: AC
  Filled 2023-08-02: qty 1000

## 2023-08-02 MED ORDER — CLOPIDOGREL BISULFATE 75 MG PO TABS
75.0000 mg | ORAL_TABLET | Freq: Every day | ORAL | Status: DC
  Administered 2023-08-03 – 2023-08-07 (×5): 75 mg via ORAL
  Filled 2023-08-02 (×5): qty 1

## 2023-08-02 MED ORDER — PHENYLEPHRINE HCL (PRESSORS) 10 MG/ML IV SOLN
INTRAVENOUS | Status: DC | PRN
  Administered 2023-08-02 (×2): 100 ug via INTRAVENOUS

## 2023-08-02 MED ORDER — ROCURONIUM BROMIDE 100 MG/10ML IV SOLN
INTRAVENOUS | Status: DC | PRN
  Administered 2023-08-02: 50 mg via INTRAVENOUS
  Administered 2023-08-02: 30 mg via INTRAVENOUS
  Administered 2023-08-02: 20 mg via INTRAVENOUS
  Administered 2023-08-02: 50 mg via INTRAVENOUS

## 2023-08-02 MED ORDER — VISTASEAL 10 ML SINGLE DOSE KIT
PACK | CUTANEOUS | Status: AC
  Filled 2023-08-02: qty 20

## 2023-08-02 MED ORDER — HEPARIN SODIUM (PORCINE) 1000 UNIT/ML IJ SOLN
INTRAMUSCULAR | Status: AC
  Filled 2023-08-02: qty 10

## 2023-08-02 MED ORDER — SORBITOL 70 % SOLN
30.0000 mL | Freq: Every day | Status: DC | PRN
Start: 2023-08-02 — End: 2023-08-07

## 2023-08-02 MED ORDER — DOCUSATE SODIUM 100 MG PO CAPS
100.0000 mg | ORAL_CAPSULE | Freq: Every day | ORAL | Status: DC
  Administered 2023-08-03 – 2023-08-07 (×5): 100 mg via ORAL
  Filled 2023-08-02 (×5): qty 1

## 2023-08-02 MED ORDER — ACETAMINOPHEN 650 MG RE SUPP: 325.0000 mg | RECTAL | Status: DC | PRN

## 2023-08-02 MED ORDER — ROCURONIUM BROMIDE 10 MG/ML (PF) SYRINGE
PREFILLED_SYRINGE | INTRAVENOUS | Status: AC
  Filled 2023-08-02: qty 10

## 2023-08-02 MED ORDER — EPHEDRINE SULFATE (PRESSORS) 50 MG/ML IJ SOLN
INTRAMUSCULAR | Status: DC | PRN
  Administered 2023-08-02 (×3): 5 mg via INTRAVENOUS
  Administered 2023-08-02: 10 mg via INTRAVENOUS
  Administered 2023-08-02: 5 mg via INTRAVENOUS

## 2023-08-02 MED ORDER — HYDROMORPHONE HCL 1 MG/ML IJ SOLN
INTRAMUSCULAR | Status: AC
  Filled 2023-08-02: qty 1

## 2023-08-02 MED ORDER — CEFAZOLIN SODIUM-DEXTROSE 2-4 GM/100ML-% IV SOLN
2.0000 g | Freq: Three times a day (TID) | INTRAVENOUS | Status: AC
  Administered 2023-08-02 – 2023-08-03 (×2): 2 g via INTRAVENOUS
  Filled 2023-08-02 (×3): qty 100

## 2023-08-02 MED ORDER — HEPARIN SODIUM (PORCINE) 1000 UNIT/ML IJ SOLN
INTRAMUSCULAR | Status: DC | PRN
  Administered 2023-08-02: 6000 [IU] via INTRAVENOUS

## 2023-08-02 MED ORDER — HYDRALAZINE HCL 20 MG/ML IJ SOLN: 5.0000 mg | INTRAMUSCULAR | Status: DC | PRN

## 2023-08-02 MED ORDER — GENTAMICIN SULFATE 40 MG/ML IJ SOLN
INTRAMUSCULAR | Status: AC
  Filled 2023-08-02: qty 2

## 2023-08-02 MED ORDER — POTASSIUM CHLORIDE CRYS ER 20 MEQ PO TBCR: 20.0000 meq | EXTENDED_RELEASE_TABLET | Freq: Every day | ORAL | Status: DC | PRN

## 2023-08-02 MED ORDER — DEXAMETHASONE SODIUM PHOSPHATE 10 MG/ML IJ SOLN
INTRAMUSCULAR | Status: DC | PRN
  Administered 2023-08-02: 5 mg via INTRAVENOUS

## 2023-08-02 MED ORDER — EPHEDRINE 5 MG/ML INJ
INTRAVENOUS | Status: AC
  Filled 2023-08-02: qty 5

## 2023-08-02 MED ORDER — VISTASEAL 10 ML SINGLE DOSE KIT
PACK | CUTANEOUS | Status: DC | PRN
  Administered 2023-08-02: 10 mL via TOPICAL

## 2023-08-02 MED ORDER — HYDROMORPHONE HCL 1 MG/ML IJ SOLN
1.0000 mg | Freq: Once | INTRAMUSCULAR | Status: AC | PRN
  Administered 2023-08-02: 1 mg via INTRAVENOUS
  Filled 2023-08-02: qty 1

## 2023-08-02 MED ORDER — OXYCODONE HCL 5 MG/5ML PO SOLN: 5.0000 mg | Freq: Once | ORAL | Status: DC | PRN

## 2023-08-02 MED ORDER — PROPOFOL 10 MG/ML IV BOLUS
INTRAVENOUS | Status: AC
  Filled 2023-08-02: qty 20

## 2023-08-02 MED ORDER — LACTATED RINGERS IV SOLN: INTRAVENOUS | Status: DC

## 2023-08-02 MED ORDER — MORPHINE SULFATE (PF) 2 MG/ML IV SOLN
2.0000 mg | INTRAVENOUS | Status: DC | PRN
  Administered 2023-08-03: 2 mg via INTRAVENOUS
  Filled 2023-08-02 (×2): qty 1

## 2023-08-02 MED ORDER — ACETAMINOPHEN 325 MG PO TABS
325.0000 mg | ORAL_TABLET | ORAL | Status: DC | PRN
  Administered 2023-08-02: 325 mg via ORAL
  Administered 2023-08-03 – 2023-08-06 (×3): 650 mg via ORAL
  Filled 2023-08-02: qty 1
  Filled 2023-08-02 (×3): qty 2

## 2023-08-02 MED ORDER — FENTANYL CITRATE (PF) 100 MCG/2ML IJ SOLN
INTRAMUSCULAR | Status: AC
  Filled 2023-08-02: qty 2

## 2023-08-02 MED ORDER — METOPROLOL TARTRATE 5 MG/5ML IV SOLN: 2.0000 mg | INTRAVENOUS | Status: DC | PRN

## 2023-08-02 MED ORDER — ENOXAPARIN SODIUM 40 MG/0.4ML IJ SOSY
40.0000 mg | PREFILLED_SYRINGE | INTRAMUSCULAR | Status: DC
  Administered 2023-08-03 – 2023-08-07 (×5): 40 mg via SUBCUTANEOUS
  Filled 2023-08-02 (×5): qty 0.4

## 2023-08-02 MED ORDER — SODIUM CHLORIDE 0.9 % IV SOLN
500.0000 mL | Freq: Once | INTRAVENOUS | Status: AC | PRN
  Administered 2023-08-03: 500 mL via INTRAVENOUS

## 2023-08-02 MED ORDER — DOPAMINE-DEXTROSE 3.2-5 MG/ML-% IV SOLN: 3.0000 ug/kg/min | INTRAVENOUS | Status: DC

## 2023-08-02 MED ORDER — LABETALOL HCL 5 MG/ML IV SOLN: 10.0000 mg | INTRAVENOUS | Status: DC | PRN

## 2023-08-02 MED ORDER — LIDOCAINE HCL (PF) 2 % IJ SOLN
INTRAMUSCULAR | Status: AC
  Filled 2023-08-02: qty 5

## 2023-08-02 MED ORDER — CEFAZOLIN SODIUM-DEXTROSE 2-4 GM/100ML-% IV SOLN
2.0000 g | INTRAVENOUS | Status: AC
  Administered 2023-08-02 (×2): 2 g via INTRAVENOUS

## 2023-08-02 MED ORDER — ONDANSETRON HCL 4 MG/2ML IJ SOLN
INTRAMUSCULAR | Status: DC | PRN
  Administered 2023-08-02: 4 mg via INTRAVENOUS

## 2023-08-02 MED ORDER — ONDANSETRON HCL 4 MG/2ML IJ SOLN
4.0000 mg | Freq: Four times a day (QID) | INTRAMUSCULAR | Status: DC | PRN
Start: 2023-08-02 — End: 2023-08-07

## 2023-08-02 MED ORDER — GUAIFENESIN-DM 100-10 MG/5ML PO SYRP
15.0000 mL | ORAL_SOLUTION | ORAL | Status: DC | PRN
Start: 2023-08-02 — End: 2023-08-07

## 2023-08-02 MED ORDER — MIDAZOLAM HCL 2 MG/2ML IJ SOLN
INTRAMUSCULAR | Status: AC
Start: 2023-08-02 — End: ?
  Filled 2023-08-02: qty 2

## 2023-08-02 MED ORDER — FENTANYL CITRATE (PF) 100 MCG/2ML IJ SOLN
INTRAMUSCULAR | Status: DC | PRN
  Administered 2023-08-02 (×4): 50 ug via INTRAVENOUS

## 2023-08-02 MED ORDER — ONDANSETRON HCL 4 MG/2ML IJ SOLN: 4.0000 mg | Freq: Four times a day (QID) | INTRAMUSCULAR | Status: DC | PRN

## 2023-08-02 MED ORDER — SODIUM CHLORIDE 0.9 % IV SOLN
INTRAVENOUS | Status: DC | PRN
Start: 2023-08-02 — End: 2023-08-02

## 2023-08-02 MED ORDER — HEPARIN 5000 UNITS IN NS 1000 ML (FLUSH)
INTRAMUSCULAR | Status: DC | PRN
  Administered 2023-08-02: 1 mL via INTRAMUSCULAR

## 2023-08-02 MED ORDER — SODIUM CHLORIDE 0.9 % IV SOLN: INTRAVENOUS | Status: DC

## 2023-08-02 MED ORDER — AZELASTINE-FLUTICASONE 137-50 MCG/ACT NA SUSP: 1.0000 | Freq: Every day | NASAL | Status: DC | PRN

## 2023-08-02 MED ORDER — ICOSAPENT ETHYL 1 G PO CAPS: 1.0000 g | ORAL_CAPSULE | Freq: Four times a day (QID) | ORAL | Status: DC

## 2023-08-02 MED ORDER — FAMOTIDINE IN NACL 20-0.9 MG/50ML-% IV SOLN
20.0000 mg | Freq: Two times a day (BID) | INTRAVENOUS | Status: DC
  Administered 2023-08-02: 20 mg via INTRAVENOUS
  Filled 2023-08-02: qty 50

## 2023-08-02 MED ORDER — HEPARIN 30,000 UNITS/1000 ML (OHS) CELLSAVER SOLUTION
Status: AC | PRN
  Administered 2023-08-02: 1

## 2023-08-02 MED ORDER — MICROFIBRILLAR COLL HEMOSTAT EX PADS
MEDICATED_PAD | CUTANEOUS | Status: DC | PRN
  Administered 2023-08-02: 3 via TOPICAL

## 2023-08-02 MED ORDER — OXYCODONE-ACETAMINOPHEN 5-325 MG PO TABS
1.0000 | ORAL_TABLET | ORAL | Status: DC | PRN
  Administered 2023-08-04: 2 via ORAL
  Administered 2023-08-04 (×2): 1 via ORAL
  Administered 2023-08-04: 2 via ORAL
  Administered 2023-08-05 (×3): 1 via ORAL
  Administered 2023-08-06 – 2023-08-07 (×2): 2 via ORAL
  Filled 2023-08-02: qty 1
  Filled 2023-08-02: qty 2
  Filled 2023-08-02 (×3): qty 1
  Filled 2023-08-02 (×2): qty 2
  Filled 2023-08-02 (×3): qty 1

## 2023-08-02 MED ORDER — CHLORHEXIDINE GLUCONATE CLOTH 2 % EX PADS
6.0000 | MEDICATED_PAD | Freq: Once | CUTANEOUS | Status: AC
  Administered 2023-08-02: 6 via TOPICAL

## 2023-08-02 MED ORDER — NITROGLYCERIN IN D5W 200-5 MCG/ML-% IV SOLN: 5.0000 ug/min | INTRAVENOUS | Status: DC

## 2023-08-02 MED ORDER — FLUTICASONE PROPIONATE 50 MCG/ACT NA SUSP: 1.0000 | Freq: Every day | NASAL | Status: DC | PRN

## 2023-08-02 MED ORDER — HEPARIN SODIUM (PORCINE) 5000 UNIT/ML IJ SOLN
INTRAMUSCULAR | Status: AC
  Filled 2023-08-02: qty 1

## 2023-08-02 MED ORDER — BUPIVACAINE HCL (PF) 0.5 % IJ SOLN
INTRAMUSCULAR | Status: AC
Start: 2023-08-02 — End: ?
  Filled 2023-08-02: qty 30

## 2023-08-02 MED ORDER — VANCOMYCIN HCL 1000 MG IV SOLR
INTRAVENOUS | Status: AC
  Filled 2023-08-02: qty 20

## 2023-08-02 MED ORDER — CHLORHEXIDINE GLUCONATE 0.12 % MT SOLN
OROMUCOSAL | Status: AC
  Filled 2023-08-02: qty 15

## 2023-08-02 MED ORDER — MOMETASONE FURO-FORMOTEROL FUM 100-5 MCG/ACT IN AERO
2.0000 | INHALATION_SPRAY | Freq: Two times a day (BID) | RESPIRATORY_TRACT | Status: DC
  Administered 2023-08-02 – 2023-08-07 (×9): 2 via RESPIRATORY_TRACT
  Filled 2023-08-02: qty 8.8

## 2023-08-02 MED ORDER — ROSUVASTATIN CALCIUM 10 MG PO TABS
40.0000 mg | ORAL_TABLET | Freq: Every day | ORAL | Status: DC
  Administered 2023-08-03 – 2023-08-07 (×5): 40 mg via ORAL
  Filled 2023-08-02 (×5): qty 4

## 2023-08-02 MED ORDER — ICOSAPENT ETHYL 1 G PO CAPS
2.0000 g | ORAL_CAPSULE | Freq: Two times a day (BID) | ORAL | Status: DC
  Administered 2023-08-02 – 2023-08-07 (×10): 2 g via ORAL
  Filled 2023-08-02 (×12): qty 2

## 2023-08-02 MED ORDER — CHLORHEXIDINE GLUCONATE CLOTH 2 % EX PADS
6.0000 | MEDICATED_PAD | Freq: Every day | CUTANEOUS | Status: DC
  Administered 2023-08-02 – 2023-08-05 (×4): 6 via TOPICAL

## 2023-08-02 MED ORDER — LIDOCAINE HCL (PF) 2 % IJ SOLN
INTRAMUSCULAR | Status: DC | PRN
  Administered 2023-08-02: 60 mg via INTRADERMAL

## 2023-08-02 MED ORDER — SUGAMMADEX SODIUM 200 MG/2ML IV SOLN
INTRAVENOUS | Status: DC | PRN
  Administered 2023-08-02: 200 mg via INTRAVENOUS

## 2023-08-02 MED ORDER — MIDAZOLAM HCL 2 MG/2ML IJ SOLN
INTRAMUSCULAR | Status: DC | PRN
  Administered 2023-08-02: 2 mg via INTRAVENOUS

## 2023-08-02 MED ORDER — ALUM & MAG HYDROXIDE-SIMETH 200-200-20 MG/5ML PO SUSP: 15.0000 mL | ORAL | Status: DC | PRN

## 2023-08-02 MED ORDER — CHLORHEXIDINE GLUCONATE 0.12 % MT SOLN
15.0000 mL | Freq: Once | OROMUCOSAL | Status: AC
Start: 2023-08-02 — End: 2023-08-02
  Administered 2023-08-02: 15 mL via OROMUCOSAL

## 2023-08-02 MED ORDER — CEFAZOLIN SODIUM 1 G IJ SOLR
INTRAMUSCULAR | Status: AC
  Filled 2023-08-02: qty 20

## 2023-08-02 MED ORDER — CEFAZOLIN SODIUM-DEXTROSE 2-4 GM/100ML-% IV SOLN
INTRAVENOUS | Status: AC
  Filled 2023-08-02: qty 100

## 2023-08-02 MED ORDER — HYDROMORPHONE HCL 1 MG/ML IJ SOLN
INTRAMUSCULAR | Status: DC | PRN
  Administered 2023-08-02 (×4): .5 mg via INTRAVENOUS

## 2023-08-02 MED ORDER — PROPOFOL 10 MG/ML IV BOLUS
INTRAVENOUS | Status: DC | PRN
  Administered 2023-08-02: 150 mg via INTRAVENOUS

## 2023-08-02 MED ORDER — OXYCODONE HCL 5 MG PO TABS: 5.0000 mg | ORAL_TABLET | Freq: Once | ORAL | Status: DC | PRN

## 2023-08-02 MED ORDER — ASPIRIN 81 MG PO TBEC
81.0000 mg | DELAYED_RELEASE_TABLET | Freq: Every day | ORAL | Status: DC
  Administered 2023-08-03 – 2023-08-07 (×5): 81 mg via ORAL
  Filled 2023-08-02 (×5): qty 1

## 2023-08-02 SURGICAL SUPPLY — 75 items
APPLIER CLIP 11 MED OPEN (CLIP)
APPLIER CLIP 9.375 SM OPEN (CLIP) ×3
BAG DECANTER FOR FLEXI CONT (MISCELLANEOUS) ×3 IMPLANT
BAG ISOLATATION DRAPE 20X20 ST (DRAPES) ×3 IMPLANT
BLADE SURG 15 STRL LF DISP TIS (BLADE) ×3 IMPLANT
BLADE SURG SZ11 CARB STEEL (BLADE) ×3 IMPLANT
BRUSH SCRUB EZ 4% CHG (MISCELLANEOUS) ×3 IMPLANT
CHLORAPREP W/TINT 26 (MISCELLANEOUS) ×6 IMPLANT
CLAMP SUTURE YELLOW 5 PAIRS (MISCELLANEOUS) ×3 IMPLANT
CLIP APPLIE 11 MED OPEN (CLIP) IMPLANT
CLIP APPLIE 9.375 SM OPEN (CLIP) IMPLANT
CLIP SPRNG 6 S-JAW DBL (CLIP) IMPLANT
CLIP SPRNG 6MM S-JAW DBL (CLIP) ×3
COVER PROBE FLX POLY STRL (MISCELLANEOUS) IMPLANT
DRAPE INCISE IOBAN 66X45 STRL (DRAPES) ×6 IMPLANT
DRAPE SHEET LG 3/4 BI-LAMINATE (DRAPES) ×3 IMPLANT
DRESSING SURGICEL FIBRLLR 1X2 (HEMOSTASIS) ×3 IMPLANT
DRSG OPSITE POSTOP 4X10 (GAUZE/BANDAGES/DRESSINGS) IMPLANT
DRSG OPSITE POSTOP 4X8 (GAUZE/BANDAGES/DRESSINGS) IMPLANT
DRSG SURGICEL FIBRILLAR 1X2 (HEMOSTASIS) ×6
ELECT CAUTERY BLADE 6.4 (BLADE) ×6 IMPLANT
ELECT REM PT RETURN 9FT ADLT (ELECTROSURGICAL) ×3
ELECTRODE REM PT RTRN 9FT ADLT (ELECTROSURGICAL) ×3 IMPLANT
GAUZE 4X4 16PLY ~~LOC~~+RFID DBL (SPONGE) IMPLANT
GEL ULTRASOUND 20GR AQUASONIC (MISCELLANEOUS) IMPLANT
GLOVE BIO SURGEON STRL SZ7 (GLOVE) ×3 IMPLANT
GLOVE SURG SYN 8.0 (GLOVE) ×3 IMPLANT
GLOVE SURG SYN 8.0 PF PI (GLOVE) ×3 IMPLANT
GOWN STRL REUS W/ TWL LRG LVL3 (GOWN DISPOSABLE) ×12 IMPLANT
GOWN STRL REUS W/ TWL XL LVL3 (GOWN DISPOSABLE) ×6 IMPLANT
GOWN STRL REUS W/TWL 2XL LVL3 (GOWN DISPOSABLE) ×3 IMPLANT
GRADUATE 1200CC STRL 31836 (MISCELLANEOUS) IMPLANT
HEAD CUTTING 'VALVULOTOME URSL (MISCELLANEOUS) IMPLANT
IV NS 500ML BAXH (IV SOLUTION) ×3 IMPLANT
KIT PREVENA INCISION MGT 13 (CANNISTER) IMPLANT
KIT TURNOVER KIT A (KITS) ×3 IMPLANT
LABEL OR SOLS (LABEL) ×3 IMPLANT
LOOP VESSEL MAXI 1X406 RED (MISCELLANEOUS) ×9 IMPLANT
LOOP VESSEL MINI 0.8X406 BLUE (MISCELLANEOUS) ×6 IMPLANT
MANIFOLD NEPTUNE II (INSTRUMENTS) ×3 IMPLANT
NDL FILTER BLUNT 18X1 1/2 (NEEDLE) ×3 IMPLANT
NEEDLE FILTER BLUNT 18X1 1/2 (NEEDLE) ×3 IMPLANT
NS IRRIG 1000ML POUR BTL (IV SOLUTION) ×3 IMPLANT
PACK BASIN MAJOR ARMC (MISCELLANEOUS) ×3 IMPLANT
PACK UNIVERSAL (MISCELLANEOUS) ×3 IMPLANT
PAD PREP OB/GYN DISP 24X41 (PERSONAL CARE ITEMS) ×3 IMPLANT
PENCIL SMOKE EVACUATOR (MISCELLANEOUS) ×3 IMPLANT
SET WALTER ACTIVATION W/DRAPE (SET/KITS/TRAYS/PACK) ×3 IMPLANT
SOLUTION CELL SAVER (CLIP) ×3 IMPLANT
SPONGE T-LAP 18X18 ~~LOC~~+RFID (SPONGE) IMPLANT
STAPLER SKIN PROX 35W (STAPLE) ×3 IMPLANT
SUT ETHIBOND CT1 BRD #0 30IN (SUTURE) IMPLANT
SUT ETHILON 3-0 FS-10 30 BLK (SUTURE) ×3
SUT PROLENE 3 0 SH DA (SUTURE) IMPLANT
SUT PROLENE 5 0 RB 1 DA (SUTURE) ×6 IMPLANT
SUT PROLENE 6 0 BV (SUTURE) ×18 IMPLANT
SUT PROLENE 7 0 BV 1 (SUTURE) ×12 IMPLANT
SUT SILK 2 0 SH (SUTURE) ×3 IMPLANT
SUT SILK 2-0 18XBRD TIE 12 (SUTURE) ×3 IMPLANT
SUT SILK 3-0 18XBRD TIE 12 (SUTURE) ×3 IMPLANT
SUT SILK 4 0 SH (SUTURE) IMPLANT
SUT VIC AB 2-0 CT1 (SUTURE) ×6 IMPLANT
SUT VIC AB 3-0 SH 27X BRD (SUTURE) ×3 IMPLANT
SUT VICRYL+ 3-0 36IN CT-1 (SUTURE) ×6 IMPLANT
SUTURE EHLN 3-0 FS-10 30 BLK (SUTURE) ×3 IMPLANT
SYR 20ML LL LF (SYRINGE) ×3 IMPLANT
SYR 3ML LL SCALE MARK (SYRINGE) ×3 IMPLANT
TAG SUTURE CLAMP YLW 5PR (MISCELLANEOUS) ×3
TAPE UMBILICAL 1/8 X36 TWILL (MISCELLANEOUS) ×3 IMPLANT
TOWEL OR 17X26 4PK STRL BLUE (TOWEL DISPOSABLE) ×3 IMPLANT
TRAP FLUID SMOKE EVACUATOR (MISCELLANEOUS) ×3 IMPLANT
TRAY FOLEY MTR SLVR 16FR STAT (SET/KITS/TRAYS/PACK) ×3 IMPLANT
VALVULOTOME HEAD CUTTING URSL (MISCELLANEOUS) IMPLANT
VALVULOTOME URESIL (MISCELLANEOUS)
WATER STERILE IRR 500ML POUR (IV SOLUTION) ×3 IMPLANT

## 2023-08-02 NOTE — Interval H&P Note (Signed)
 History and Physical Interval Note:  08/02/2023 7:03 AM  Clayton CHRISTELLA Salt, Clayton Powell  has presented today for surgery, with the diagnosis of ASO WITH REST PAIN.  The various methods of treatment have been discussed with the patient and family. After consideration of risks, benefits and other options for treatment, the patient has consented to  Procedure(s): BYPASS GRAFT FEMORAL-TIBIAL ARTERY (Right) VEIN HARVEST (Left) APPLICATION OF CELL SAVER (Right) as a surgical intervention.  The patient's history has been reviewed, patient examined, no change in status, stable for surgery.  I have reviewed the patient's chart and labs.  Questions were answered to the patient's satisfaction.     Cordella Shawl

## 2023-08-02 NOTE — Op Note (Signed)
 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 and Selinda CANDIE Gu, MD  ASSISTANT(S): Toribio Musty, RNFA  ANESTHESIA: general  ESTIMATED BLOOD LOSS: 370 cc  FINDING(S): None   SPECIMEN(S): Calcific plaque from the tibioperoneal trunk and the peroneal artery  INDICATIONS:   Clayton CHRISTELLA Salt, PhD is a 78 y.o. male who presents with rest pain right lower extremity. The risk, benefits, and alternative for bypass operations were discussed with the patient.  The patient is aware the risks include but are not limited to: bleeding, infection, myocardial infarction, stroke, limb loss, nerve damage, need for additional procedures in the future, wound complications, and inability to complete the bypass.  The patient is voices understanding of these risks and agreed to proceed.  DESCRIPTION: After informed consent was obtained, the patient was brought back to the operating room and placed in the supine position.  Prior to induction, the patient was given intravenous antibiotics.  After general anesthesia is induced, the patient was prepped and draped in the standard fashion for a femoral to popliteal bypass operation.  Appropriate timeout is called.  Co-surgeons are required because this is a complex multilevel procedure with work being performed simultaneously from both the patient's right and left sides.  This also expedites the procedure making a shorter operative time reducing complications and improving patient safety.   Attention was turned to the right groin.  A  longitudinal incision was made over the right common femoral artery.  Using blunt dissection and electrocautery, the artery was dissected circumferentially from the inguinal ligament down to the femoral bifurcation.  The superficial femoral artery, profunda femoral artery, and distal external iliac artery are looped with Silastic vessel loops.  Circumflex branches were also dissected and controlled with vessel loops as needed.     Medial incision was then made in the calf and the tibioperoneal trunk and proximal posterior tibial artery was identified. It was dissected circumferentially and looped with Silastic vessel loops.  A Silastic vessel loop was also placed around the origin of the peroneal artery. A tunneler was then passed from the calf incision up to the groin incision on the right.  Ultrasound was utilized to identify the great saphenous vein in the left calf.  The left great saphenous vein was then harvested using a series of 4 incisions.  Branches were ligated and divided with 2-0 and 3-0 silk ties when they were encountered.  Once complete circumferential dissection had been achieved the saphenofemoral junction was taken down and the venotomy repaired with a 5-0 Prolene suture.  An umbilical tape was then utilized on the right side to measure the length the vein needed this was approximated to the left great saphenous vein and then the distal saphenous vein was ligated and divided with a 2-0 silk tie.  The distal end of the saphenous vein was then dilated with Honora coronary dilators beginning with a 2 mm dilator and increasing up to 4 mm.  The vein was then irrigated with heparinized saline.  The patient was given 6000 units of Heparin  intravenously, and this is allowed to circulate for proximally 5 minutes.  The proximal common femoral artery, superficial femoral  artery, and profunda femoral artery were then clamped along with any circumflex branches.  Arteriotomy is then made in the common  femoral artery with an 11 blade and extended with Potts scissors. Arteriotomy is extended into the origin of the SFA.   The distal end of the vein was then spatulated and applied in an end vein to side common femoral artery anastomosis using running 6-0 Prolene suture.  Flushing maneuvers were performed and the vein was pressurized and clamped at the distal end with a bulldog.  Any bleeding spots along the suture line were controlled with 6-0 Prolene interrupted sutures.  The vein was then stretched the length of the leg and marked with a surgical marker to maintain orientation.  The vein was then secured to the tunneling device and pulled through the subcutaneous tunnel.  Vein was strongly pulsatile upon completion of this with removal of the tunneling device.  It again was clamped with the bulldog.  Attention was then turned to the tibial target site. The leg was straightened and final measurements were made. Arteriotomy was made with a 11-blade and extended with Potts scissors.  Endarterectomy was performed of the distal tibioperoneal trunk and an eversion endarterectomy was performed of the peroneal.  The arteriotomy was extended onto the posterior tibial artery specifically.  The distal end of the vein was spatulated to the appropriate length.  The vein was then sewn to the to the artery in an end-to-side configuration with a running stitch of 6-0 Prolene.  Prior to completing the anastomosis, flushing maneuvers were performed and then flow was established distally.  After inspecting the anastomosis for hemostasis, Surgicel was placed around the suture line.  Doppler was delivered onto the field and triphasic Doppler signals were noted in the posterior tibial artery distal to the anastomosis.  The vein graft itself as well as the peroneal artery also had triphasic signals.  The wounds were then irrigated with sterile saline.  The wounds were then inspected for hemostasis. Bleeding points were controlled  with electrocautery, and suture repair of active bleeding points.  Fibrillar and Vistaseal  were then placed in the bed of the wounds. Once hemostasis was achieved.  The groin incision was then closed in multiple layers using both 2-0 and 3-0 Vicryl in interrupted and running fashion. Skin was closed with a staples. The medial calf and medial thigh wound were then closed layers using  3-0 Vicryl, and staples.   On the left wounds were closed with 3-0 Vicryl in 1-2 layers.  Staples were also used to close the incisions on the left.  A Prevena disposable VAC dressing was applied to each the groin incision on the right and honeycomb dressings were applied to all other incisions.   COMPLICATIONS: None  CONDITION: Good  Cordella KANDICE Shawl, M.D. [facility] Office: [phone]  08/02/2023, 12:23 PM

## 2023-08-02 NOTE — Transfer of Care (Signed)
 Immediate Anesthesia Transfer of Care Note  Patient: Clayton CHRISTELLA Salt, PhD  Procedure(s) Performed: BYPASS GRAFT FEMORAL-TIBIAL ARTERY (Right: Leg Lower) VEIN HARVEST (Left: Leg Lower) APPLICATION OF CELL SAVER (Right)  Patient Location: PACU  Anesthesia Type:General  Level of Consciousness: unresponsive  Airway & Oxygen Therapy: Patient Spontanous Breathing and Patient connected to nasal cannula oxygen  Post-op Assessment: Report given to RN and Post -op Vital signs reviewed and stable  Post vital signs: Reviewed and stable  Last Vitals:  Vitals Value Taken Time  BP 133/72 08/02/23 1232  Temp 36.5 C 08/02/23 1232  Pulse 74 08/02/23 1238  Resp 11 08/02/23 1238  SpO2 98 % 08/02/23 1238  Vitals shown include unfiled device data.  Last Pain:  Vitals:   08/02/23 0613  TempSrc: Oral  PainSc: 0-No pain         Complications: No notable events documented.

## 2023-08-02 NOTE — Anesthesia Preprocedure Evaluation (Signed)
 Anesthesia Evaluation  Patient identified by MRN, date of birth, ID band Patient awake    Reviewed: Allergy & Precautions, NPO status , Patient's Chart, lab work & pertinent test results, reviewed documented beta blocker date and time   History of Anesthesia Complications Negative for: history of anesthetic complications  Airway Mallampati: II  TM Distance: >3 FB Neck ROM: full    Dental  (+) Caps, Dental Advisory Given   Pulmonary neg pulmonary ROS   Pulmonary exam normal breath sounds clear to auscultation       Cardiovascular hypertension, Pt. on medications + angina  + CAD, + CABG and + Peripheral Vascular Disease  negative cardio ROS Normal cardiovascular exam Rhythm:Regular Rate:Normal  CABG x 3 05/29/20   Neuro/Psych negative neurological ROS  negative psych ROS   GI/Hepatic negative GI ROS, Neg liver ROS,GERD  Medicated,,  Endo/Other  negative endocrine ROS  Hyperlipidemia  Renal/GU      Musculoskeletal   Abdominal   Peds  Hematology negative hematology ROS (+) Blood dyscrasia Plavix  therapy   Anesthesia Other Findings Past Medical History: No date: Acute gastroenteritis No date: Allergy No date: Anginal pain (HCC) No date: Arthritis No date: Atherosclerosis of native arteries of extremity with  intermittent claudication (HCC) No date: Bilateral high frequency sensorineural hearing loss 12/2005: BPH (benign prostatic hypertrophy) No date: Bruxism 2010: CAD (coronary artery disease)     Comment:  dx made by coronary calcium  scoring, Normal stress test               in 2010.  No date: Decreased dorsalis pedis pulse No date: ETD (eustachian tube dysfunction) No date: Gallstones 06/2004: GERD (gastroesophageal reflux disease) 09/1997: HLD (hyperlipidemia) No date: Hyperlipidemia, mixed No date: Incomplete bladder emptying 08/12/2011: Left rotator cuff tear     Comment:  Per Dr. Josefina 2013 L  rotator cuff tear on MRI 2013  No date: Metabolic syndrome X No date: Peripheral vascular disease (HCC) No date: Prostatitis, chronic 05/24/2013: Right rotator cuff tear No date: Unstable angina (HCC) No date: Vasomotor rhinitis No date: Wears glasses  Past Surgical History: 12/15/05: [facility] chest pain     Comment:  Dr. Florencio 10/11: BLEPHAROPLASTY     Comment:  Dr. Orval  No date: bone spur removed; Right 09/24/04: COLONOSCOPY     Comment:  nml; Dr. Neita  05/29/2020: CORONARY ARTERY BYPASS GRAFT; N/A     Comment:  Procedure: CORONARY ARTERY BYPASS GRAFTING (CABG) TIMES               THREE USING LIMA to LAD; ENDOSCOPIC HARVESTED RIGHT               GREATER SAPHENOUS VEIN: SVG to PD; SVG to RAMUS.;                Surgeon: Army Dallas NOVAK, MD;  Location: [facility] OR;                Service: Open Heart Surgery;  Laterality: N/A; 01/26/06: EGD esophagitis by bx     Comment:  gastritis bx neg h-pylori 05/29/2020: ENDOVEIN HARVEST OF GREATER SAPHENOUS VEIN; Right     Comment:  Procedure: ENDOVEIN HARVEST OF GREATER SAPHENOUS VEIN;                Surgeon: Army Dallas NOVAK, MD;  Location: [facility] OR;                Service: Open Heart Surgery;  Laterality: Right; 05/26/2020: LEFT HEART CATH  AND CORONARY ANGIOGRAPHY; N/A     Comment:  Procedure: LEFT HEART CATH AND CORONARY ANGIOGRAPHY;                Surgeon: Hester Wolm PARAS, MD;  Location: [facility] INVASIVE               CV LAB;  Service: Cardiovascular;  Laterality: N/A; 10/01/2020: LOWER EXTREMITY ANGIOGRAPHY; Right     Comment:  Procedure: LOWER EXTREMITY ANGIOGRAPHY;  Surgeon: Marea Selinda RAMAN, MD;  Location: [facility] INVASIVE CV LAB;  Service:               Cardiovascular;  Laterality: Right; 08/12/2021: LOWER EXTREMITY ANGIOGRAPHY; Right     Comment:  Procedure: LOWER EXTREMITY ANGIOGRAPHY;  Surgeon: Marea Selinda RAMAN, MD;  Location: [facility] INVASIVE CV LAB;  Service:               Cardiovascular;  Laterality:  Right; 08/23/2021: LOWER EXTREMITY ANGIOGRAPHY; Right     Comment:  Procedure: LOWER EXTREMITY ANGIOGRAPHY;  Surgeon: Marea Selinda RAMAN, MD;  Location: [facility] INVASIVE CV LAB;  Service:               Cardiovascular;  Laterality: Right; 06/06/2023: LOWER EXTREMITY ANGIOGRAPHY; Right     Comment:  Procedure: Lower Extremity Angiography;  Surgeon:               Jama Cordella MATSU, MD;  Location: [facility] INVASIVE CV LAB;               Service: Cardiovascular;  Laterality: Right; 09/06/2022: PARTIAL KNEE ARTHROPLASTY; Left     Comment:  Procedure: UNICOMPARTMENTAL KNEE;  Surgeon: Josefina Chew, MD;  Location: [facility] ORS;  Service: Orthopedics;                Laterality: Left; No date: POLYPECTOMY 05/02/03: right and left prostate needle bx     Comment:  acute inflammation; Dr. Chales  12/2011: SHOULDER ARTHROSCOPY W/ ROTATOR CUFF REPAIR     Comment:  left 05/24/2013: SHOULDER ARTHROSCOPY WITH ROTATOR CUFF REPAIR AND  SUBACROMIAL DECOMPRESSION; Right     Comment:  Procedure: RIGHT SHOULDER ARTHROSCOPY WITH ROTATOR CUFF               REPAIR AND SUBACROMIAL DECOMPRESSION AND PARTIAL               ACROMIOPLSTY WITH CORACOACROMIAL RELEASE, DISTAL               CLAVICULECTOMY, LABRIAL DEBRIDEMENT;  Surgeon: Chew SHAUNNA Josefina, MD;  Location: [facility];                Service: Orthopedics;  Laterality: Right; 12/16/05: spect ETT nml 05/29/2020: TEE WITHOUT CARDIOVERSION; N/A     Comment:  Procedure: TRANSESOPHAGEAL ECHOCARDIOGRAM (TEE);                Surgeon: Army Dallas NOVAK, MD;  Location: [facility] OR;                Service: Open Heart Surgery;  Laterality: N/A; No date: UPPER GASTROINTESTINAL ENDOSCOPY No date: WISDOM TOOTH  EXTRACTION  BMI    Body Mass Index: 30.96 kg/m      Reproductive/Obstetrics negative OB ROS ED                             Anesthesia Physical Anesthesia Plan  ASA: 3  Anesthesia Plan: General ETT  and General   Post-op Pain Management:    Induction: Intravenous  PONV Risk Score and Plan: 2 and Ondansetron  and Dexamethasone   Airway Management Planned: Oral ETT  Additional Equipment:   Intra-op Plan:   Post-operative Plan: Extubation in OR  Informed Consent: I have reviewed the patients History and Physical, chart, labs and discussed the procedure including the risks, benefits and alternatives for the proposed anesthesia with the patient or authorized representative who has indicated his/her understanding and acceptance.     Dental Advisory Given  Plan Discussed with: Anesthesiologist, CRNA and Surgeon  Anesthesia Plan Comments: (Patient consented for risks of anesthesia including but not limited to:  - adverse reactions to medications - damage to eyes, teeth, lips or other oral mucosa - nerve damage due to positioning  - sore throat or hoarseness - Damage to heart, brain, nerves, lungs, other parts of body or loss of life  Patient voiced understanding and assent.)       Anesthesia Quick Evaluation

## 2023-08-02 NOTE — Anesthesia Procedure Notes (Signed)
 Procedure Name: Intubation Date/Time: 08/02/2023 7:43 AM  Performed by: Niki Manus SAUNDERS, CRNAPre-anesthesia Checklist: Patient identified, Patient being monitored, Timeout performed, Emergency Drugs available and Suction available Patient Re-evaluated:Patient Re-evaluated prior to induction Oxygen Delivery Method: Circle system utilized Preoxygenation: Pre-oxygenation with 100% oxygen Induction Type: IV induction Ventilation: Mask ventilation without difficulty Laryngoscope Size: Mac and 4 Grade View: Grade I Tube type: Oral Tube size: 7.5 mm Number of attempts: 1 Airway Equipment and Method: Stylet Placement Confirmation: ETT inserted through vocal cords under direct vision, positive ETCO2 and breath sounds checked- equal and bilateral Secured at: 21 cm Tube secured with: Tape Dental Injury: Teeth and Oropharynx as per pre-operative assessment

## 2023-08-02 NOTE — H&P (View-Only) (Signed)
 MRN : [id number]  Clayton ZOLL, PhD is a 78 y.o. (Aug 15, 1945) male who presents with chief complaint of check circulation.  History of Present Illness:   Patient presents to [facility] today for treatment of his right lower extremity atherosclerotic occlusive disease associated with severe rest pain.  He has undergone an angiogram on 06/06/2023 for right lower extremity atherosclerosis and significant claudication. The patient has had multiple interventions on the right lower extremity before. However after this recent angiogram it was not amenable to endovascular intervention. It was felt that it was in the best interest of the patient to stop the procedure and discuss possible surgical options. Currently the patient has not experienced rest pain but he does have significant worsening claudication. Based upon the angiogram he has occlusion of the SFA through the popliteal. Ultimately it was decided that the best course of action will likely be a bypass from the femoral artery to the tibioperoneal trunk.  In the office the patient underwent vein mapping. He has minimal adequate great saphenous vein in the right that was used years ago for CABG. However the left great saphenous vein appears to be adequate size.   Current Meds  Medication Sig   acetaminophen  (TYLENOL ) 500 MG tablet Take 1,000 mg by mouth every 6 (six) hours as needed for moderate pain or mild pain.   Azelastine -Fluticasone  137-50 MCG/ACT SUSP Place 1 spray into both nostrils daily as needed (Congestion).   CALCIUM  CITRATE PO Take 600 mg by mouth in the morning and at bedtime.   Cholecalciferol  (VITAMIN D ) 2000 UNITS CAPS Take 2,000 Units by mouth daily.   clopidogrel  (PLAVIX ) 75 MG tablet TAKE 1 TABLET(75 MG) BY MOUTH DAILY   Cyanocobalamin  (VITAMIN B-12) 5000 MCG SUBL Place 5,000 mcg under the tongue daily.   dapagliflozin   propanediol (FARXIGA ) 10 MG TABS tablet Take 1 tablet (10 mg total) by mouth daily before breakfast.   icosapent  Ethyl (VASCEPA ) 1 g capsule Take 1 capsule (1 g total) by mouth 4 (four) times daily.   Magnesium  Salicylate Tetrahyd (DOANS EXTRA STRENGTH) 580 MG TABS Take 2 tablets by mouth as needed (Pain).   Multiple Vitamin (MULTIVITAMIN) capsule Take 1 capsule by mouth daily.   rosuvastatin  (CRESTOR ) 40 MG tablet Take 1 tablet (40 mg total) by mouth daily.   SYMBICORT 80-4.5 MCG/ACT inhaler Inhale 2 puffs into the lungs 2 (two) times daily as needed (shortness of breath).   tadalafil  (CIALIS ) 5 MG tablet Take 5 mg by mouth daily.    Past Medical History:  Diagnosis Date   Acute gastroenteritis    Allergy    Anginal pain (HCC)    Arthritis    Atherosclerosis of native arteries of extremity with intermittent claudication (HCC)    Bilateral high frequency sensorineural hearing loss    BPH (benign prostatic hypertrophy) 12/2005   Bruxism    CAD (coronary artery disease) 2010   dx made by coronary calcium  scoring, Normal stress test in 2010.    Decreased dorsalis pedis pulse    ETD (eustachian tube dysfunction)    Gallstones  GERD (gastroesophageal reflux disease) 06/2004   HLD (hyperlipidemia) 09/1997   Hyperlipidemia, mixed    Incomplete bladder emptying    Left rotator cuff tear 08/12/2011   Per Dr. Josefina 2013 L rotator cuff tear on MRI 2013    Metabolic syndrome X    Peripheral vascular disease (HCC)    Prostatitis, chronic    Right rotator cuff tear 05/24/2013   Unstable angina (HCC)    Vasomotor rhinitis    Wears glasses     Past Surgical History:  Procedure Laterality Date   admitted to [facility] chest pain  12/15/05   Dr. Florencio   BLEPHAROPLASTY  10/11   Dr. Orval    bone spur removed Right    COLONOSCOPY  09/24/04   nml; Dr. Neita    CORONARY ARTERY BYPASS GRAFT N/A 05/29/2020   Procedure: CORONARY ARTERY BYPASS GRAFTING (CABG) TIMES THREE USING LIMA to LAD;  ENDOSCOPIC HARVESTED RIGHT GREATER SAPHENOUS VEIN: SVG to PD; SVG to RAMUS.;  Surgeon: Army Dallas NOVAK, MD;  Location: [facility] OR;  Service: Open Heart Surgery;  Laterality: N/A;   EGD esophagitis by bx  01/26/06   gastritis bx neg h-pylori   ENDOVEIN HARVEST OF GREATER SAPHENOUS VEIN Right 05/29/2020   Procedure: ENDOVEIN HARVEST OF GREATER SAPHENOUS VEIN;  Surgeon: Army Dallas NOVAK, MD;  Location: [facility] OR;  Service: Open Heart Surgery;  Laterality: Right;   LEFT HEART CATH AND CORONARY ANGIOGRAPHY N/A 05/26/2020   Procedure: LEFT HEART CATH AND CORONARY ANGIOGRAPHY;  Surgeon: Hester Wolm PARAS, MD;  Location: [facility] INVASIVE CV LAB;  Service: Cardiovascular;  Laterality: N/A;   LOWER EXTREMITY ANGIOGRAPHY Right 10/01/2020   Procedure: LOWER EXTREMITY ANGIOGRAPHY;  Surgeon: Marea Selinda RAMAN, MD;  Location: [facility];  Service: Cardiovascular;  Laterality: Right;   LOWER EXTREMITY ANGIOGRAPHY Right 08/12/2021   Procedure: LOWER EXTREMITY ANGIOGRAPHY;  Surgeon: Marea Selinda RAMAN, MD;  Location: [facility];  Service: Cardiovascular;  Laterality: Right;   LOWER EXTREMITY ANGIOGRAPHY Right 08/23/2021   Procedure: LOWER EXTREMITY ANGIOGRAPHY;  Surgeon: Marea Selinda RAMAN, MD;  Location: [facility];  Service: Cardiovascular;  Laterality: Right;   LOWER EXTREMITY ANGIOGRAPHY Right 06/06/2023   Procedure: Lower Extremity Angiography;  Surgeon: Jama Cordella MATSU, MD;  Location: [facility];  Service: Cardiovascular;  Laterality: Right;   PARTIAL KNEE ARTHROPLASTY Left 09/06/2022   Procedure: UNICOMPARTMENTAL KNEE;  Surgeon: Josefina Chew, MD;  Location: [facility] ORS;  Service: Orthopedics;  Laterality: Left;   POLYPECTOMY     right and left prostate needle bx  05/02/03   acute inflammation; Dr. Chales    SHOULDER ARTHROSCOPY W/ ROTATOR CUFF REPAIR  12/2011   left   SHOULDER ARTHROSCOPY WITH ROTATOR CUFF REPAIR AND SUBACROMIAL DECOMPRESSION Right 05/24/2013   Procedure: RIGHT SHOULDER  ARTHROSCOPY WITH ROTATOR CUFF REPAIR AND SUBACROMIAL DECOMPRESSION AND PARTIAL ACROMIOPLSTY WITH CORACOACROMIAL RELEASE, DISTAL CLAVICULECTOMY, LABRIAL DEBRIDEMENT;  Surgeon: Chew SHAUNNA Josefina, MD;  Location: [facility];  Service: Orthopedics;  Laterality: Right;   spect ETT nml  12/16/05   TEE WITHOUT CARDIOVERSION N/A 05/29/2020   Procedure: TRANSESOPHAGEAL ECHOCARDIOGRAM (TEE);  Surgeon: Army Dallas NOVAK, MD;  Location: [facility] OR;  Service: Open Heart Surgery;  Laterality: N/A;   UPPER GASTROINTESTINAL ENDOSCOPY     WISDOM TOOTH EXTRACTION      Social History Social History   Tobacco Use   Smoking status: Never   Smokeless tobacco: Never  Vaping Use   Vaping status: Never Used  Substance  Use Topics   Alcohol use: Yes    Alcohol/week: 4.0 standard drinks of alcohol    Types: 4 Cans of beer per week    Comment: beer, occ   Drug use: No    Family History Family History  Problem Relation Age of Onset   Stroke Mother    Glaucoma Other        grandmother at 71   Barrett's esophagus Maternal Uncle    Prostate cancer Neg Hx    Colon cancer Neg Hx    Colon polyps Neg Hx    Esophageal cancer Neg Hx    Rectal cancer Neg Hx    Stomach cancer Neg Hx     Allergies  Allergen Reactions   Ciprofloxacin Hcl Rash   Quinolones Rash     REVIEW OF SYSTEMS (Negative unless checked)  Constitutional: [] Weight loss  [] Fever  [] Chills Cardiac: [] Chest pain   [] Chest pressure   [] Palpitations   [] Shortness of breath when laying flat   [] Shortness of breath with exertion. Vascular:  [x] Pain in legs with walking   [] Pain in legs at rest  [] History of DVT   [] Phlebitis   [] Swelling in legs   [] Varicose veins   [] Non-healing ulcers Pulmonary:   [] Uses home oxygen   [] Productive cough   [] Hemoptysis   [] Wheeze  [] COPD   [] Asthma Neurologic:  [] Dizziness   [] Seizures   [] History of stroke   [] History of TIA  [] Aphasia   [] Vissual changes   [] Weakness or numbness in arm   [] Weakness or  numbness in leg Musculoskeletal:   [] Joint swelling   [] Joint pain   [] Low back pain Hematologic:  [] Easy bruising  [] Easy bleeding   [] Hypercoagulable state   [] Anemic Gastrointestinal:  [] Diarrhea   [] Vomiting  [] Gastroesophageal reflux/heartburn   [] Difficulty swallowing. Genitourinary:  [] Chronic kidney disease   [] Difficult urination  [] Frequent urination   [] Blood in urine Skin:  [] Rashes   [] Ulcers  Psychological:  [] History of anxiety   []  History of major depression.  Physical Examination  Vitals:   08/02/23 0613  BP: (!) 150/81  Pulse: 74  Resp: 16  Temp: 97.7 F (36.5 C)  TempSrc: Oral  SpO2: 95%  Weight: 87 kg  Height: 5' 6 (1.676 m)   Body mass index is 30.96 kg/m. Gen: WD/WN, NAD Head: [place]/AT, No temporalis wasting.  Ear/Nose/Throat: Hearing grossly intact, nares w/o erythema or drainage Eyes: PER, EOMI, sclera nonicteric.  Neck: Supple, no masses.  No bruit or JVD.  Pulmonary:  Good air movement, no audible wheezing, no use of accessory muscles.  Cardiac: RRR, normal S1, S2, no Murmurs. Vascular:  mild trophic changes, no open wounds Vessel Right Left  Radial Palpable Palpable  PT Not Palpable Palpable  DP Not Palpable Trace Palpable  Gastrointestinal: soft, non-distended. No guarding/no peritoneal signs.  Musculoskeletal: M/S 5/5 throughout.  No visible deformity.  Neurologic: CN 2-12 intact. Pain and light touch intact in extremities.  Symmetrical.  Speech is fluent. Motor exam as listed above. Psychiatric: Judgment intact, Mood & affect appropriate for pt's clinical situation. Dermatologic: No rashes or ulcers noted.  No changes consistent with cellulitis.   CBC Lab Results  Component Value Date   WBC 7.2 07/27/2023   HGB 13.8 07/27/2023   HCT 41.0 07/27/2023   MCV 87.6 07/27/2023   PLT 215 07/27/2023    BMET    Component Value Date/Time   NA 136 07/27/2023 1309   NA 139 04/21/2023 0829   NA 140 05/12/2012  1240   K 4.3 07/27/2023 1309   K  4.2 05/12/2012 1240   CL 102 07/27/2023 1309   CL 107 05/12/2012 1240   CO2 24 07/27/2023 1309   CO2 26 05/12/2012 1240   GLUCOSE 140 (H) 07/27/2023 1309   GLUCOSE 101 (H) 05/12/2012 1240   BUN 25 (H) 07/27/2023 1309   BUN 20 04/21/2023 0829   BUN 21 (H) 05/12/2012 1240   CREATININE 0.85 07/27/2023 1309   CREATININE 0.86 05/12/2012 1240   CALCIUM  8.8 (L) 07/27/2023 1309   CALCIUM  8.9 05/12/2012 1240   GFRNONAA >60 07/27/2023 1309   GFRNONAA >60 05/12/2012 1240   GFRAA >60 05/12/2012 1240   Estimated Creatinine Clearance: 75.3 mL/min (by C-G formula based on SCr of 0.85 mg/dL).  COAG Lab Results  Component Value Date   INR 1.0 05/13/2023   INR 1.4 (H) 05/29/2020   INR 1.1 05/28/2020    Radiology No results found.   Assessment/Plan 1. Atherosclerosis of native artery of right lower extremity with intermittent claudication (HCC)  Recommend:   The patient has evidence of severe atherosclerotic changes of the right lower extremity and significant claudication and rest pain.   Angiography has been performed and the situation is not ideal for intervention.  Given this finding open surgical repair is recommended.    Patient should undergo arterial reconstruction, with a right lower extremity femoral to tibialperoneal bypass with left lower extremity great saphenous vein harvest. with the hope for limb salvage.  The risks and benefits as well as the alternative therapies was discussed in detail with the patient.  All questions were answered.  Patient agrees to proceed with open vascular surgical reconstruction.   The patient will follow up with me in the office after the procedure.      2. Primary hypertension Continue antihypertensive medications as already ordered, these medications have been reviewed and there are no changes at this time.   3. Hyperlipidemia, mixed Continue statin as ordered and reviewed, no changes at this time   Cordella Shawl, MD  08/02/2023 6:57  AM

## 2023-08-02 NOTE — Progress Notes (Signed)
 MRN : [id number]  Clayton ZOLL, PhD is a 78 y.o. (Aug 15, 1945) male who presents with chief complaint of check circulation.  History of Present Illness:   Patient presents to [facility] today for treatment of his right lower extremity atherosclerotic occlusive disease associated with severe rest pain.  He has undergone an angiogram on 06/06/2023 for right lower extremity atherosclerosis and significant claudication. The patient has had multiple interventions on the right lower extremity before. However after this recent angiogram it was not amenable to endovascular intervention. It was felt that it was in the best interest of the patient to stop the procedure and discuss possible surgical options. Currently the patient has not experienced rest pain but he does have significant worsening claudication. Based upon the angiogram he has occlusion of the SFA through the popliteal. Ultimately it was decided that the best course of action will likely be a bypass from the femoral artery to the tibioperoneal trunk.  In the office the patient underwent vein mapping. He has minimal adequate great saphenous vein in the right that was used years ago for CABG. However the left great saphenous vein appears to be adequate size.   Current Meds  Medication Sig   acetaminophen  (TYLENOL ) 500 MG tablet Take 1,000 mg by mouth every 6 (six) hours as needed for moderate pain or mild pain.   Azelastine -Fluticasone  137-50 MCG/ACT SUSP Place 1 spray into both nostrils daily as needed (Congestion).   CALCIUM  CITRATE PO Take 600 mg by mouth in the morning and at bedtime.   Cholecalciferol  (VITAMIN D ) 2000 UNITS CAPS Take 2,000 Units by mouth daily.   clopidogrel  (PLAVIX ) 75 MG tablet TAKE 1 TABLET(75 MG) BY MOUTH DAILY   Cyanocobalamin  (VITAMIN B-12) 5000 MCG SUBL Place 5,000 mcg under the tongue daily.   dapagliflozin   propanediol (FARXIGA ) 10 MG TABS tablet Take 1 tablet (10 mg total) by mouth daily before breakfast.   icosapent  Ethyl (VASCEPA ) 1 g capsule Take 1 capsule (1 g total) by mouth 4 (four) times daily.   Magnesium  Salicylate Tetrahyd (DOANS EXTRA STRENGTH) 580 MG TABS Take 2 tablets by mouth as needed (Pain).   Multiple Vitamin (MULTIVITAMIN) capsule Take 1 capsule by mouth daily.   rosuvastatin  (CRESTOR ) 40 MG tablet Take 1 tablet (40 mg total) by mouth daily.   SYMBICORT 80-4.5 MCG/ACT inhaler Inhale 2 puffs into the lungs 2 (two) times daily as needed (shortness of breath).   tadalafil  (CIALIS ) 5 MG tablet Take 5 mg by mouth daily.    Past Medical History:  Diagnosis Date   Acute gastroenteritis    Allergy    Anginal pain (HCC)    Arthritis    Atherosclerosis of native arteries of extremity with intermittent claudication (HCC)    Bilateral high frequency sensorineural hearing loss    BPH (benign prostatic hypertrophy) 12/2005   Bruxism    CAD (coronary artery disease) 2010   dx made by coronary calcium  scoring, Normal stress test in 2010.    Decreased dorsalis pedis pulse    ETD (eustachian tube dysfunction)    Gallstones  GERD (gastroesophageal reflux disease) 06/2004   HLD (hyperlipidemia) 09/1997   Hyperlipidemia, mixed    Incomplete bladder emptying    Left rotator cuff tear 08/12/2011   Per Dr. Josefina 2013 L rotator cuff tear on MRI 2013    Metabolic syndrome X    Peripheral vascular disease (HCC)    Prostatitis, chronic    Right rotator cuff tear 05/24/2013   Unstable angina (HCC)    Vasomotor rhinitis    Wears glasses     Past Surgical History:  Procedure Laterality Date   admitted to [facility] chest pain  12/15/05   Dr. Florencio   BLEPHAROPLASTY  10/11   Dr. Orval    bone spur removed Right    COLONOSCOPY  09/24/04   nml; Dr. Neita    CORONARY ARTERY BYPASS GRAFT N/A 05/29/2020   Procedure: CORONARY ARTERY BYPASS GRAFTING (CABG) TIMES THREE USING LIMA to LAD;  ENDOSCOPIC HARVESTED RIGHT GREATER SAPHENOUS VEIN: SVG to PD; SVG to RAMUS.;  Surgeon: Army Dallas NOVAK, MD;  Location: [facility] OR;  Service: Open Heart Surgery;  Laterality: N/A;   EGD esophagitis by bx  01/26/06   gastritis bx neg h-pylori   ENDOVEIN HARVEST OF GREATER SAPHENOUS VEIN Right 05/29/2020   Procedure: ENDOVEIN HARVEST OF GREATER SAPHENOUS VEIN;  Surgeon: Army Dallas NOVAK, MD;  Location: [facility] OR;  Service: Open Heart Surgery;  Laterality: Right;   LEFT HEART CATH AND CORONARY ANGIOGRAPHY N/A 05/26/2020   Procedure: LEFT HEART CATH AND CORONARY ANGIOGRAPHY;  Surgeon: Hester Wolm PARAS, MD;  Location: [facility];  Service: Cardiovascular;  Laterality: N/A;   LOWER EXTREMITY ANGIOGRAPHY Right 10/01/2020   Procedure: LOWER EXTREMITY ANGIOGRAPHY;  Surgeon: Marea Selinda RAMAN, MD;  Location: [facility];  Service: Cardiovascular;  Laterality: Right;   LOWER EXTREMITY ANGIOGRAPHY Right 08/12/2021   Procedure: LOWER EXTREMITY ANGIOGRAPHY;  Surgeon: Marea Selinda RAMAN, MD;  Location: [facility];  Service: Cardiovascular;  Laterality: Right;   LOWER EXTREMITY ANGIOGRAPHY Right 08/23/2021   Procedure: LOWER EXTREMITY ANGIOGRAPHY;  Surgeon: Marea Selinda RAMAN, MD;  Location: [facility];  Service: Cardiovascular;  Laterality: Right;   LOWER EXTREMITY ANGIOGRAPHY Right 06/06/2023   Procedure: Lower Extremity Angiography;  Surgeon: Jama Cordella MATSU, MD;  Location: [facility];  Service: Cardiovascular;  Laterality: Right;   PARTIAL KNEE ARTHROPLASTY Left 09/06/2022   Procedure: UNICOMPARTMENTAL KNEE;  Surgeon: Josefina Chew, MD;  Location: [facility];  Service: Orthopedics;  Laterality: Left;   POLYPECTOMY     right and left prostate needle bx  05/02/03   acute inflammation; Dr. Chales    SHOULDER ARTHROSCOPY W/ ROTATOR CUFF REPAIR  12/2011   left   SHOULDER ARTHROSCOPY WITH ROTATOR CUFF REPAIR AND SUBACROMIAL DECOMPRESSION Right 05/24/2013   Procedure: RIGHT SHOULDER  ARTHROSCOPY WITH ROTATOR CUFF REPAIR AND SUBACROMIAL DECOMPRESSION AND PARTIAL ACROMIOPLSTY WITH CORACOACROMIAL RELEASE, DISTAL CLAVICULECTOMY, LABRIAL DEBRIDEMENT;  Surgeon: Chew SHAUNNA Josefina, MD;  Location: [facility];  Service: Orthopedics;  Laterality: Right;   spect ETT nml  12/16/05   TEE WITHOUT CARDIOVERSION N/A 05/29/2020   Procedure: TRANSESOPHAGEAL ECHOCARDIOGRAM (TEE);  Surgeon: Army Dallas NOVAK, MD;  Location: [facility] OR;  Service: Open Heart Surgery;  Laterality: N/A;   UPPER GASTROINTESTINAL ENDOSCOPY     WISDOM TOOTH EXTRACTION      Social History Social History   Tobacco Use   Smoking status: Never   Smokeless tobacco: Never  Vaping Use   Vaping status: Never Used  Substance  Use Topics   Alcohol use: Yes    Alcohol/week: 4.0 standard drinks of alcohol    Types: 4 Cans of beer per week    Comment: beer, occ   Drug use: No    Family History Family History  Problem Relation Age of Onset   Stroke Mother    Glaucoma Other        grandmother at 71   Barrett's esophagus Maternal Uncle    Prostate cancer Neg Hx    Colon cancer Neg Hx    Colon polyps Neg Hx    Esophageal cancer Neg Hx    Rectal cancer Neg Hx    Stomach cancer Neg Hx     Allergies  Allergen Reactions   Ciprofloxacin Hcl Rash   Quinolones Rash     REVIEW OF SYSTEMS (Negative unless checked)  Constitutional: [] Weight loss  [] Fever  [] Chills Cardiac: [] Chest pain   [] Chest pressure   [] Palpitations   [] Shortness of breath when laying flat   [] Shortness of breath with exertion. Vascular:  [x] Pain in legs with walking   [] Pain in legs at rest  [] History of DVT   [] Phlebitis   [] Swelling in legs   [] Varicose veins   [] Non-healing ulcers Pulmonary:   [] Uses home oxygen   [] Productive cough   [] Hemoptysis   [] Wheeze  [] COPD   [] Asthma Neurologic:  [] Dizziness   [] Seizures   [] History of stroke   [] History of TIA  [] Aphasia   [] Vissual changes   [] Weakness or numbness in arm   [] Weakness or  numbness in leg Musculoskeletal:   [] Joint swelling   [] Joint pain   [] Low back pain Hematologic:  [] Easy bruising  [] Easy bleeding   [] Hypercoagulable state   [] Anemic Gastrointestinal:  [] Diarrhea   [] Vomiting  [] Gastroesophageal reflux/heartburn   [] Difficulty swallowing. Genitourinary:  [] Chronic kidney disease   [] Difficult urination  [] Frequent urination   [] Blood in urine Skin:  [] Rashes   [] Ulcers  Psychological:  [] History of anxiety   []  History of major depression.  Physical Examination  Vitals:   08/02/23 0613  BP: (!) 150/81  Pulse: 74  Resp: 16  Temp: 97.7 F (36.5 C)  TempSrc: Oral  SpO2: 95%  Weight: 87 kg  Height: 5' 6 (1.676 m)   Body mass index is 30.96 kg/m. Gen: WD/WN, NAD Head: [place]/AT, No temporalis wasting.  Ear/Nose/Throat: Hearing grossly intact, nares w/o erythema or drainage Eyes: PER, EOMI, sclera nonicteric.  Neck: Supple, no masses.  No bruit or JVD.  Pulmonary:  Good air movement, no audible wheezing, no use of accessory muscles.  Cardiac: RRR, normal S1, S2, no Murmurs. Vascular:  mild trophic changes, no open wounds Vessel Right Left  Radial Palpable Palpable  PT Not Palpable Palpable  DP Not Palpable Trace Palpable  Gastrointestinal: soft, non-distended. No guarding/no peritoneal signs.  Musculoskeletal: M/S 5/5 throughout.  No visible deformity.  Neurologic: CN 2-12 intact. Pain and light touch intact in extremities.  Symmetrical.  Speech is fluent. Motor exam as listed above. Psychiatric: Judgment intact, Mood & affect appropriate for pt's clinical situation. Dermatologic: No rashes or ulcers noted.  No changes consistent with cellulitis.   CBC Lab Results  Component Value Date   WBC 7.2 07/27/2023   HGB 13.8 07/27/2023   HCT 41.0 07/27/2023   MCV 87.6 07/27/2023   PLT 215 07/27/2023    BMET    Component Value Date/Time   NA 136 07/27/2023 1309   NA 139 04/21/2023 0829   NA 140 05/12/2012  1240   K 4.3 07/27/2023 1309   K  4.2 05/12/2012 1240   CL 102 07/27/2023 1309   CL 107 05/12/2012 1240   CO2 24 07/27/2023 1309   CO2 26 05/12/2012 1240   GLUCOSE 140 (H) 07/27/2023 1309   GLUCOSE 101 (H) 05/12/2012 1240   BUN 25 (H) 07/27/2023 1309   BUN 20 04/21/2023 0829   BUN 21 (H) 05/12/2012 1240   CREATININE 0.85 07/27/2023 1309   CREATININE 0.86 05/12/2012 1240   CALCIUM  8.8 (L) 07/27/2023 1309   CALCIUM  8.9 05/12/2012 1240   GFRNONAA >60 07/27/2023 1309   GFRNONAA >60 05/12/2012 1240   GFRAA >60 05/12/2012 1240   Estimated Creatinine Clearance: 75.3 mL/min (by C-G formula based on SCr of 0.85 mg/dL).  COAG Lab Results  Component Value Date   INR 1.0 05/13/2023   INR 1.4 (H) 05/29/2020   INR 1.1 05/28/2020    Radiology No results found.   Assessment/Plan 1. Atherosclerosis of native artery of right lower extremity with intermittent claudication (HCC)  Recommend:   The patient has evidence of severe atherosclerotic changes of the right lower extremity and significant claudication and rest pain.   Angiography has been performed and the situation is not ideal for intervention.  Given this finding open surgical repair is recommended.    Patient should undergo arterial reconstruction, with a right lower extremity femoral to tibialperoneal bypass with left lower extremity great saphenous vein harvest. with the hope for limb salvage.  The risks and benefits as well as the alternative therapies was discussed in detail with the patient.  All questions were answered.  Patient agrees to proceed with open vascular surgical reconstruction.   The patient will follow up with me in the office after the procedure.      2. Primary hypertension Continue antihypertensive medications as already ordered, these medications have been reviewed and there are no changes at this time.   3. Hyperlipidemia, mixed Continue statin as ordered and reviewed, no changes at this time   Cordella Shawl, MD  08/02/2023 6:57  AM

## 2023-08-02 NOTE — Anesthesia Postprocedure Evaluation (Signed)
 Anesthesia Post Note  Patient: [name], PhD  Procedure(s) Performed: BYPASS GRAFT FEMORAL-TIBIAL ARTERY (Right: Leg Lower) VEIN HARVEST (Left: Leg Lower) APPLICATION OF CELL SAVER (Right)  Patient location during evaluation: PACU Anesthesia Type: General Level of consciousness: awake and alert Pain management: pain level controlled Vital Signs Assessment: post-procedure vital signs reviewed and stable Respiratory status: spontaneous breathing, nonlabored ventilation, respiratory function stable and patient connected to nasal cannula oxygen Cardiovascular status: blood pressure returned to baseline and stable Postop Assessment: no apparent nausea or vomiting Anesthetic complications: no   No notable events documented.   Last Vitals:  Vitals:   08/02/23 1355 08/02/23 1407  BP: 125/71 122/72  Pulse: 76 77  Resp: 18 11  Temp: 36.5 C 36.4 C  SpO2: 99% 97%    Last Pain:  Vitals:   08/02/23 1407  TempSrc: Oral  PainSc: 0-No pain                 Fairy POUR Jadi Deyarmin

## 2023-08-02 NOTE — Op Note (Signed)
 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 male who presents with rest pain of the right lower extremity.  The patient required surgical bypass for revascularization.  His right saphenous vein did not appear adequate for bypass, but his left great saphenous vein did and so harvesting this from the left leg was planned.  Risks and benefits were discussed including but not limited to bleeding, infection, thrombosis, limb loss, injury to nearby structures, cardiopulmonary complications, and death.  DESCRIPTION: After full informed written consent was obtained, the patient was brought back to the operating room and placed supine upon the operating table.  Prior to induction, the patient was given intravenous antibiotics.  After obtaining adequate anesthesia, the patient was prepped and draped in the standard fashion for a femoral to tibial bypass operation.  Attention was turned to the right groin.  A longitudinal incision was made over the right common femoral artery.  Using blunt dissection and electrocautery, the artery was dissected out from the inguinal ligament down to the femoral bifurcation.  The superficial femoral artery, profunda femoral artery, and external iliac artery were dissected out and vessel loops applied.  Circumflex branches were also dissected and controlled  with vessel loops.  This common femoral artery was of good size but found on exam to be calcified in the midportion.  The distal common femoral artery and most proximal superficial femoral artery would be the best options for proximal anastomosis.      At this point, attention was turned to the calf.  An longitudinal incision was made one finger-width posterior to the tibia.  Using blunt dissection and electrocautery, a plane was developed through the subcutaneous tissue and fascia down to the popliteal and tibial space.  The tibial veins were dissected out and retracted medially and posteriorly.  The tibioperoneal trunk and the proximal portions of the posterior tibial and peroneal arteries were dissected out and prepared for bypass.  The vessel was extremely calcified particularly in the tibioperoneal trunk and peroneal arteries.   At this point, the patient's left greater saphenous vein was dissected out for harvest for conduit.  Skip incisions was made over the greater saphenous vein from the saphenofemoral junction down to mid calf.  The vein conduit was found to be adequate for bypass conduit.  Side branches of greater saphenous vein were tied off with 3-0 silk ties.  The saphenofemoral junction was clamped and the greater saphenous vein was transected.  2-0 silk tie was used to ligate the vein in the most proximal saphenous vein just before the saphenofemoral junction.  The distal extent of this conduit was tied off at the level of mid calf and the conduit transected proximally.  The harvest vein conduit was soaked in a heparinized saline solution.  A vessel cannula was tied to the distal end of the vein and the vein was tested by hydrodistension.  Leaks in the conduit were repaired with 4-0 silk ties  and 7-0 Prolene stitches.  At the end of this process, we felt the conduit to be adequate in quality and size for use.   At this point, we bluntly dissected a space between the femoral condyles adjacent  to the below-the-knee popliteal vessels.  I bluntly passed the long metal tunneler between the femoral condyles in a subsartorial fashion to the groin incision.  The bullet on the tunneler was removed and then the conduit was sewn to the inner cannula with a 2-0 Silk.  I passed the conduit through the metal tunnel, taking care to maintain the orientation of the conduit.    At this point, the patient was given 7000 units of Heparin  intravenously.  After waiting three minutes, the external iliac artery, superficial femoral artery and profunda femoral artery were clamped.  An incision was made in the common femoral artery and extended proximally and distally with a Potts scissor.     The left great saphenous vein conduit was cut and bevelled to match the arteriotomy.  The conduit was sewn to the common femoral artery with a running stitch of 6-0 Prolene.  Prior to completing this anastomosis, all vessels were backbled.  No thrombus was noted from any vessels and backbleeding was good.  The anastomosis was completed in the usual fashion.  Attention was then turned to the tibial exposure.   We reset the exposure of the tibial vessels.  Control was pulled over the Vesseloops in the proximal tibioperoneal trunk and into the peroneal and posterior tibial artery several centimeters beyond their origins.  We made an incision with a 11-blade in the distal TP trunk and proximal posterior tibial artery and extended it proximally and distally with a Potts scissor.  There was dense calcific plaque most notably in the peroneal artery and tibioperoneal trunk.  Although the primary target of the bypass would be the proximal posterior tibial artery, we felt we could improve the perfusion to the peroneal artery to provide a second runoff vessel this would markedly improve the patency and help improve perfusion to the lower leg.  The Therapist, nutritional was used to create a plane in the peroneal artery and an eversion endarterectomy  was performed.  The plane was continued up with a Therapist, nutritional and the tibioperoneal trunk up to the midportion and a hemostat  was used to pull the large, dense calcific plaque out as an endarterectomy specimen.  The distal endpoint of the peroneal artery appeared relatively clean and there was good backbleeding from the peroneal artery now.  We pulled the conduit to appropriate tension and length, taking into account straightening out the leg.  We adjusted the length of the conduit sharply.  We spatulated this conduit to meet the dimensions of the arteriotomy in the distal tibioperoneal trunk and proximal posterior tibial artery.  The conduit was sewn to the distal tibioperoneal trunk and proximal posterior tibial artery with a running stitch of 6-0 Prolene.  Prior to completing this anastomosis, all vessels were backbled. No thrombus was noted from any vessels and backbleeding was good.  The bypass conduit was allowed to bleed in an antegrade fashion with excellent pulsatile flow.  The anastomosis was completed in the usual fashion.  At this point, all incisions were washed out and Fibrillar and Vistacel were placed into the incisions.    At this point, bleeding in both incisions on the right side were controlled with electrocautery and suture ligature.  The calf incision was closed with interrupted deep sutures to reapproximate  the deep muscles and a running stitch of 3-0 Vicryl in the subcutaneous tissue.  The skin was reapproximated with staples. .  Attention was turned to the groin.  The groin was repaired with a double layer of 2-0 Vicryl immediately superficial to the bypass conduit.  The superficial subcutaneous tissue was reapproximated with two layers of 3-0 Vicryl.  The skin was reapproximated with staples.  The skin was cleaned, dried, and a sterile dressing was placed  The left leg vein harvest incisions were closed with a layer of 3-0 Vicryl in the subcutaneous tissue.  The skin was then  reapproximated with staples.  The skin was cleaned, dried, and sterile bandages applied.   The patient was then awakened from anesthesia and taken to the recovery room in stable condition having tolerated the procedure well.     COMPLICATIONS: none  CONDITION: stable   Selinda Gu 08/02/2023 12:42 PM     This note was created with Dragon Medical transcription system. Any errors in dictation are purely unintentional.

## 2023-08-03 ENCOUNTER — Encounter: Payer: Self-pay | Admitting: Vascular Surgery

## 2023-08-03 LAB — CBC WITH DIFFERENTIAL/PLATELET
Abs Immature Granulocytes: 0.01 10*3/uL (ref 0.00–0.07)
Basophils Absolute: 0 10*3/uL (ref 0.0–0.1)
Basophils Relative: 0 %
Eosinophils Absolute: 0.1 10*3/uL (ref 0.0–0.5)
Eosinophils Relative: 1 %
HCT: 29.4 % — ABNORMAL LOW (ref 39.0–52.0)
Hemoglobin: 9.6 g/dL — ABNORMAL LOW (ref 13.0–17.0)
Immature Granulocytes: 0 %
Lymphocytes Relative: 24 %
Lymphs Abs: 1.6 10*3/uL (ref 0.7–4.0)
MCH: 29.4 pg (ref 26.0–34.0)
MCHC: 32.7 g/dL (ref 30.0–36.0)
MCV: 89.9 fL (ref 80.0–100.0)
Monocytes Absolute: 1.3 10*3/uL — ABNORMAL HIGH (ref 0.1–1.0)
Monocytes Relative: 19 %
Neutro Abs: 3.7 10*3/uL (ref 1.7–7.7)
Neutrophils Relative %: 56 %
Platelets: 177 10*3/uL (ref 150–400)
RBC: 3.27 MIL/uL — ABNORMAL LOW (ref 4.22–5.81)
RDW: 14.2 % (ref 11.5–15.5)
WBC: 6.6 10*3/uL (ref 4.0–10.5)
nRBC: 0 % (ref 0.0–0.2)

## 2023-08-03 LAB — BASIC METABOLIC PANEL
Anion gap: 10 (ref 5–15)
Anion gap: 9 (ref 5–15)
BUN: 25 mg/dL — ABNORMAL HIGH (ref 8–23)
BUN: 28 mg/dL — ABNORMAL HIGH (ref 8–23)
CO2: 20 mmol/L — ABNORMAL LOW (ref 22–32)
CO2: 22 mmol/L (ref 22–32)
Calcium: 7.8 mg/dL — ABNORMAL LOW (ref 8.9–10.3)
Calcium: 7.9 mg/dL — ABNORMAL LOW (ref 8.9–10.3)
Chloride: 100 mmol/L (ref 98–111)
Chloride: 106 mmol/L (ref 98–111)
Creatinine, Ser: 1.09 mg/dL (ref 0.61–1.24)
Creatinine, Ser: 1.11 mg/dL (ref 0.61–1.24)
GFR, Estimated: >60 mL/min (ref >60)
GFR, Estimated: >60 mL/min (ref >60)
Glucose, Bld: 104 mg/dL — ABNORMAL HIGH (ref 70–99)
Glucose, Bld: 136 mg/dL — ABNORMAL HIGH (ref 70–99)
Potassium: 4 mmol/L (ref 3.5–5.1)
Potassium: 4.8 mmol/L (ref 3.5–5.1)
Sodium: 131 mmol/L — ABNORMAL LOW (ref 135–145)
Sodium: 136 mmol/L (ref 135–145)

## 2023-08-03 LAB — GLUCOSE, CAPILLARY
Glucose-Capillary: 149 mg/dL — ABNORMAL HIGH (ref 70–99)
Glucose-Capillary: 114 mg/dL — ABNORMAL HIGH (ref 70–99)
Glucose-Capillary: 102 mg/dL — ABNORMAL HIGH (ref 70–99)

## 2023-08-03 MED ORDER — FAMOTIDINE 20 MG PO TABS
20.0000 mg | ORAL_TABLET | Freq: Two times a day (BID) | ORAL | Status: DC
  Administered 2023-08-03 – 2023-08-07 (×9): 20 mg via ORAL
  Filled 2023-08-03 (×9): qty 1

## 2023-08-03 NOTE — Evaluation (Signed)
 Physical Therapy Evaluation Patient Details Name: RAMON ZANDERS, PhD MRN: [id number] DOB: 06-21-1946 Today's Date: 08/03/2023  History of Present Illness  78 y/o s/p R common femoral artery to posterior tibial artery bypass on 08/02/23. PMH: CAD, PVD, HTN  Clinical Impression  Patient admitted following above procedure. PTA, patient lives with wife and reports independence. Per Gwendlyn Shank, NP, limited mobility/exertion this date due to being <24 hours since procedure but can progress OOB to chair and in room ambulation this date. Patient presents with weakness and decreased activity tolerance. Complaining of burning pain sensation with BLEs in dependent position. Required minA for bed mobility and CGA for sit to stand and step pivot transfer with RW this date. Anticipate patient will mobilize well once cleared. Patient will benefit from skilled PT services during acute stay to address listed deficits. Recommend OPPT at discharge for strengthening per patient request.       If plan is discharge home, recommend the following: A little help with bathing/dressing/bathroom;Assistance with cooking/housework;Help with stairs or ramp for entrance;Assist for transportation   Can travel by private vehicle        Equipment Recommendations Rolling Toniya Rozar (2 wheels)  Recommendations for Other Services       Functional Status Assessment Patient has had a recent decline in their functional status and demonstrates the ability to make significant improvements in function in a reasonable and predictable amount of time.     Precautions / Restrictions Precautions Precautions: Fall Precaution Comments: wound vac Restrictions Weight Bearing Restrictions Per Provider Order: No      Mobility  Bed Mobility Overal bed mobility: Needs Assistance Bed Mobility: Supine to Sit     Supine to sit: Min assist, Used rails     General bed mobility comments: increased time to complete with light assist for  trunk elevation    Transfers Overall transfer level: Needs assistance Equipment used: Rolling Novice Vrba (2 wheels) Transfers: Sit to/from Stand, Bed to chair/wheelchair/BSC Sit to Stand: Contact guard assist   Step pivot transfers: Contact guard assist       General transfer comment: CGA for safety. Cues for hand placement    Ambulation/Gait                  Stairs            Wheelchair Mobility     Tilt Bed    Modified Rankin (Stroke Patients Only)       Balance Overall balance assessment: Needs assistance Sitting-balance support: No upper extremity supported, Feet supported Sitting balance-Leahy Scale: Good     Standing balance support: Bilateral upper extremity supported, Reliant on assistive device for balance Standing balance-Leahy Scale: Fair                               Pertinent Vitals/Pain Pain Assessment Pain Assessment: 0-10 Pain Score: 6  Pain Location: BLE Pain Descriptors / Indicators: Burning Pain Intervention(s): Monitored during session    Home Living Family/patient expects to be discharged to:: Private residence Living Arrangements: Spouse/significant other Available Help at Discharge: Family Type of Home: House Home Access: Stairs to enter Entrance Stairs-Rails: Right (entering from garage) Secretary/administrator of Steps: 4   Home Layout: Multi-level Home Equipment: Agricultural Consultant (2 wheels)      Prior Function Prior Level of Function : Independent/Modified Independent             Mobility Comments: I wth ADLs, self care,  IADLs and driving no AD for amb       Extremity/Trunk Assessment   Upper Extremity Assessment Upper Extremity Assessment: Defer to OT evaluation    Lower Extremity Assessment Lower Extremity Assessment: Generalized weakness    Cervical / Trunk Assessment Cervical / Trunk Assessment: Normal  Communication   Communication Communication: No apparent difficulties  Cognition  Arousal: Alert Behavior During Therapy: WFL for tasks assessed/performed Overall Cognitive Status: Within Functional Limits for tasks assessed                                          General Comments General comments (skin integrity, edema, etc.): VSS on RA    Exercises     Assessment/Plan    PT Assessment Patient needs continued PT services  PT Problem List Decreased strength;Decreased activity tolerance;Decreased balance;Decreased mobility;Pain       PT Treatment Interventions DME instruction;Gait training;Functional mobility training;Therapeutic activities;Stair training;Balance training;Therapeutic exercise;Patient/family education    PT Goals (Current goals can be found in the Care Plan section)  Acute Rehab PT Goals Patient Stated Goal: to get stronger PT Goal Formulation: With patient/family Time For Goal Achievement: 08/17/23 Potential to Achieve Goals: Good    Frequency Min 1X/week     Co-evaluation               AM-PAC PT 6 Clicks Mobility  Outcome Measure Help needed turning from your back to your side while in a flat bed without using bedrails?: A Little Help needed moving from lying on your back to sitting on the side of a flat bed without using bedrails?: A Little Help needed moving to and from a bed to a chair (including a wheelchair)?: A Little Help needed standing up from a chair using your arms (e.g., wheelchair or bedside chair)?: A Little Help needed to walk in hospital room?: A Little Help needed climbing 3-5 steps with a railing? : A Lot 6 Click Score: 17    End of Session   Activity Tolerance: Patient tolerated treatment well Patient left: in chair;with call bell/phone within reach;with family/visitor present Nurse Communication: Mobility status PT Visit Diagnosis: Unsteadiness on feet (R26.81);Muscle weakness (generalized) (M62.81);Other abnormalities of gait and mobility (R26.89)    Time: 9071-9046 PT Time  Calculation (min) (ACUTE ONLY): 25 min   Charges:   PT Evaluation $PT Eval Moderate Complexity: 1 Mod PT Treatments $Therapeutic Activity: 8-22 mins PT General Charges $$ ACUTE PT VISIT: 1 Visit         Maryanne Finder, PT, DPT Physical Therapist - [facility]  [facility]   Aalyssa Elderkin A Amaal Dimartino 08/03/2023, 11:29 AM

## 2023-08-03 NOTE — TOC Initial Note (Signed)
 Transition of Care [facility]) - Initial/Assessment Note    Patient Details  Name: Clayton TOWSON, Clayton Powell MRN: [id number] Date of Birth: July 20, 1946  Transition of Care [facility]) CM/SW Contact:    Lauraine JAYSON Carpen, LCSW Phone Number: 08/03/2023, 3:45 PM  Clinical Narrative:  CSW met with patient. Wife at bedside. CSW introduced role and explained that therapy recommendations would be discussed. PT is recommending outpatient therapy and OT is recommending home health. Patient and wife will consider the two options. DME recommendations for RW and 3-in-1. He already has a RW at home. He and wife do not think he will need a 3-in-1. No further concerns. CSW will continue to follow patient and his wife for support and facilitate return home once stable.                Expected Discharge Plan:  (TBD) Barriers to Discharge: Continued Medical Work up   Patient Goals and CMS Choice            Expected Discharge Plan and Services     Post Acute Care Choice:  (TBD) Living arrangements for the past 2 months: Single Family Home                                      Prior Living Arrangements/Services Living arrangements for the past 2 months: Single Family Home Lives with:: Spouse Patient language and need for interpreter reviewed:: Yes Do you feel safe going back to the place where you live?: Yes      Need for Family Participation in Patient Care: Yes (Comment) Care giver support system in place?: Yes (comment) Current home services: DME Criminal Activity/Legal Involvement Pertinent to Current Situation/Hospitalization: No - Comment as needed  Activities of Daily Living      Permission Sought/Granted Permission sought to share information with : Family Supports    Share Information with NAME: Merl Bommarito     Permission granted to share info w Relationship: Wife  Permission granted to share info w Contact Information: [phone]  Emotional Assessment Appearance:: Appears stated  age Attitude/Demeanor/Rapport: Engaged, Gracious Affect (typically observed): Accepting, Appropriate, Calm, Pleasant Orientation: : Oriented to Self, Oriented to Place, Oriented to  Time, Oriented to Situation Alcohol / Substance Use: Not Applicable Psych Involvement: No (comment)  Admission diagnosis:  Atherosclerosis of artery of extremity with rest pain [facility]) [I70.229] Patient Active Problem List   Diagnosis Date Noted   Atherosclerosis of artery of extremity with rest pain (HCC) 08/02/2023   Gallstones 06/13/2023   Atherosclerosis of native arteries of extremity with rest pain (HCC) 06/04/2023   BPH with obstruction/lower urinary tract symptoms 04/04/2023   Groin strain 01/08/2023   Allergic rhinitis due to pollen 10/29/2020   Vasomotor rhinitis 10/29/2020   Wheezing 10/29/2020   Atherosclerosis of native arteries of extremity with intermittent claudication (HCC) 09/22/2020   Decreased dorsalis pedis pulse 08/13/2020   Hyperlipidemia, mixed 07/09/2020   Conductive hearing loss of left ear with unrestricted hearing of right ear 06/29/2020   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   Unstable angina (HCC) 05/27/2020   CAD (coronary artery disease) 05/27/2020   Angina pectoris (HCC) 05/26/2020   Impacted cerumen of both ears 04/23/2018   Acute gastroenteritis 04/12/2017   Left knee pain 09/13/2016   Tympanic membrane perforation, marginal, right 09/02/2016   Bruxism 05/24/2016   Ear fullness, bilateral 05/24/2016   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  Dysfunction of both eustachian tubes 12/02/2015   ETD (eustachian tube dysfunction) 09/24/2015   Advance care planning 10/26/2014   Right rotator cuff tear 05/24/2013   Benign prostatic hyperplasia with urinary obstruction 05/18/2012   Incomplete bladder emptying 05/16/2012   Routine general medical examination at a health care facility 08/12/2011   Left rotator cuff tear 08/12/2011   CAD, NATIVE VESSEL 03/16/2009    UNSPECIFIED HEART DISEASE 03/12/2009   METABOLIC SYNDROME X 02/23/2007   GERD 02/18/2007   Allergic rhinitis 02/16/2007   PROSTATITIS, CHRONIC 02/16/2007   BPH (benign prostatic hyperplasia) 2002   PCP:  Cleatus Arlyss RAMAN, MD Pharmacy:   [id number] [place], [place] - [address] AT [facility] [address] [facility] [address] [place] [address] Phone: [phone] Fax: [phone]     Social Drivers of Health (SDOH) Social History: SDOH Screenings   Food Insecurity: No Food Insecurity (08/03/2023)  Housing: Unknown (08/03/2023)  Transportation Needs: No Transportation Needs (08/03/2023)  Utilities: Not At Risk (08/03/2023)  Alcohol Screen: Low Risk  (06/12/2023)  Depression (PHQ2-9): Low Risk  (06/13/2023)  Financial Resource Strain: Low Risk  (06/12/2023)  Physical Activity: Sufficiently Active (06/12/2023)  Social Connections: Socially Integrated (08/03/2023)  Stress: No Stress Concern Present (06/12/2023)  Tobacco Use: Low Risk  (08/02/2023)  Health Literacy: Adequate Health Literacy (03/14/2023)   SDOH Interventions:     Readmission Risk Interventions     No data to display

## 2023-08-03 NOTE — Progress Notes (Signed)
 Progress Note    08/03/2023 9:25 AM 1 Day Post-Op  Subjective:  Clayton Powell is a 78 yo male now POD #1 from right femoral to tibial bypass surgery.  Patient is recovering as expected.  Patient is resting comfortably in bed in ICU morning.  Patient endorses that his legs feel better and are normal today.  He does endorse soreness to both groins.  Dressings remain intact no complaints overnight.  Vitals all remained stable   Vitals:   08/03/23 0701 08/03/23 0800  BP:  (!) 107/55  Pulse: 62 64  Resp: 18 12  Temp: (!) 97.3 F (36.3 C)   SpO2: 94% 94%   Physical Exam: Cardiac:  RRR, normal S1 and S2, no murmurs appreciated. Lungs: Normal respiratory effort, clear on auscultation throughout but diminished at the bases. Incisions: Bilateral groin incisions clean dry and intact Praveena to right groin.  Multiple incisions to patient's left lower extremity as well as incision to right lower extremity.  All covered with cobblestone dressings with minimal drainage to them. Extremities: Bilateral lower extremities warm to touch with positive PT and DP Doppler pulses. Abdomen: Positive bowel sounds throughout, soft, nontender and nondistended. Neurologic: Alert and oriented x 3, answers all questions and follows commands appropriately.  CBC    Component Value Date/Time   WBC 10.2 08/02/2023 2340   RBC 3.60 (L) 08/02/2023 2340   HGB 10.8 (L) 08/02/2023 2340   HGB 15.1 05/12/2012 1240   HCT 32.2 (L) 08/02/2023 2340   HCT 44.7 05/12/2012 1240   PLT 201 08/02/2023 2340   PLT 222 05/12/2012 1240   MCV 89.4 08/02/2023 2340   MCV 91 05/12/2012 1240   MCH 30.0 08/02/2023 2340   MCHC 33.5 08/02/2023 2340   RDW 14.1 08/02/2023 2340   RDW 13.3 05/12/2012 1240   LYMPHSABS 1.8 07/27/2023 1309   MONOABS 0.7 07/27/2023 1309   EOSABS 0.3 07/27/2023 1309   BASOSABS 0.0 07/27/2023 1309    BMET    Component Value Date/Time   NA 136 08/02/2023 2340   NA 139 04/21/2023 0829   NA 140  05/12/2012 1240   K 4.8 08/02/2023 2340   K 4.2 05/12/2012 1240   CL 106 08/02/2023 2340   CL 107 05/12/2012 1240   CO2 20 (L) 08/02/2023 2340   CO2 26 05/12/2012 1240   GLUCOSE 136 (H) 08/02/2023 2340   GLUCOSE 101 (H) 05/12/2012 1240   BUN 28 (H) 08/02/2023 2340   BUN 20 04/21/2023 0829   BUN 21 (H) 05/12/2012 1240   CREATININE 1.09 08/02/2023 2340   CREATININE 0.86 05/12/2012 1240   CALCIUM  7.8 (L) 08/02/2023 2340   CALCIUM  8.9 05/12/2012 1240   GFRNONAA >60 08/02/2023 2340   GFRNONAA >60 05/12/2012 1240   GFRAA >60 05/12/2012 1240    INR    Component Value Date/Time   INR 1.0 05/13/2023 0846     Intake/Output Summary (Last 24 hours) at 08/03/2023 0925 Last data filed at 08/03/2023 0700 Gross per 24 hour  Intake 4972.72 ml  Output 1935 ml  Net 3037.72 ml     Assessment/Plan:  78 y.o. male is s/p right femoral to tibial bypass surgery.  1 Day Post-Op   PLAN Pain medication as needed. Advance diet as tolerated. PT OT consults. OOB to chair today. Incentive spirometry every hour while awake. Ordered CBC and BMP to follow-up. DOAC ordered and given today. Disposition to floor tomorrow on 08/04/2023.  DVT prophylaxis: ASA 81 mg daily and Plavix  75 mg  daily.   Gwendlyn JONELLE Shank [facility] 08/03/2023 9:25 AM

## 2023-08-03 NOTE — Plan of Care (Signed)
  Problem: Education: Goal: Knowledge of General Education information will improve Description: Including pain rating scale, medication(s)/side effects and non-pharmacologic comfort measures Outcome: Progressing   Problem: Clinical Measurements: Goal: Ability to maintain clinical measurements within normal limits will improve Outcome: Progressing Goal: Will remain free from infection Outcome: Progressing Goal: Cardiovascular complication will be avoided Outcome: Progressing   Problem: Activity: Goal: Risk for activity intolerance will decrease Outcome: Progressing   Problem: Nutrition: Goal: Adequate nutrition will be maintained Outcome: Progressing   Problem: Coping: Goal: Level of anxiety will decrease Outcome: Progressing   

## 2023-08-03 NOTE — Evaluation (Signed)
 Occupational Therapy Evaluation Patient Details Name: Clayton SIDDOWAY, PhD MRN: [id number] DOB: [birth year] Today's Date: 08/03/2023   History of Present Illness 78 y/o s/p R common femoral artery to posterior tibial artery bypass on 08/02/23. PMH: CAD, PVD, HTN   Clinical Impression   Chart reviewed, pt greeted in chair, agreeable to OT evaluation. PTA pt reports MOD I-I in ADL/IADL, amb with no AD. Pt presents with deficits in strength, endurance, activity tolerance, balance affecting safe and optimal Adl completion. MAX A required for LB dressing, SET UP for grooming tasks. STS with CGA however further mobility limited by pain in medial L knee, nurse in room to assess. Pt will benefit from acute OT to address deficits and to facilitate optimal ADL performance. OT will follow acutely.       If plan is discharge home, recommend the following: A little help with walking and/or transfers;A little help with bathing/dressing/bathroom;Assistance with cooking/housework;Help with stairs or ramp for entrance;Assist for transportation    Functional Status Assessment  Patient has had a recent decline in their functional status and demonstrates the ability to make significant improvements in function in a reasonable and predictable amount of time.  Equipment Recommendations  BSC/3in1    Recommendations for Other Services       Precautions / Restrictions Precautions Precautions: Fall Precaution Comments: wound vac Restrictions Weight Bearing Restrictions Per Provider Order: No      Mobility Bed Mobility               General bed mobility comments: Nt in recline pre/post session    Transfers Overall transfer level: Needs assistance Equipment used: Rolling walker (2 wheels) Transfers: Sit to/from Stand Sit to Stand: Contact guard assist           General transfer comment: increased time, 9/10 pain with mobility therefore further mobility deferred at this time; pt requests to  remain in chair      Balance Overall balance assessment: Needs assistance Sitting-balance support: No upper extremity supported, Feet supported Sitting balance-Leahy Scale: Good     Standing balance support: Bilateral upper extremity supported, Reliant on assistive device for balance Standing balance-Leahy Scale: Fair                             ADL either performed or assessed with clinical judgement   ADL Overall ADL's : Needs assistance/impaired Eating/Feeding: Set up;Sitting   Grooming: Oral care;Sitting;Set up               Lower Body Dressing: Maximal assistance;Sitting/lateral leans Lower Body Dressing Details (indicate cue type and reason): attempts to doff socks with max difficulty on this date, educated on potential use of AE               General ADL Comments: limited by L knee pain on this date     Vision Patient Visual Report: No change from baseline       Perception         Praxis         Pertinent Vitals/Pain Pain Assessment Pain Assessment: 0-10 Pain Score: 9  Pain Location: medial L knee Pain Descriptors / Indicators: Sharp Pain Intervention(s): Monitored during session, Limited activity within patient's tolerance (notified nurse)     Extremity/Trunk Assessment Upper Extremity Assessment Upper Extremity Assessment: Overall WFL for tasks assessed   Lower Extremity Assessment Lower Extremity Assessment: Generalized weakness   Cervical / Trunk Assessment Cervical / Trunk Assessment:  Normal   Communication Communication Communication: No apparent difficulties   Cognition Arousal: Alert Behavior During Therapy: WFL for tasks assessed/performed Overall Cognitive Status: Within Functional Limits for tasks assessed                                       General Comments  vss throughout    Exercises Other Exercises Other Exercises: edu pt and wife re: role of OT, role of rehab, dishcharge  recommendations, safe ADL completion   Shoulder Instructions      Home Living Family/patient expects to be discharged to:: Private residence Living Arrangements: Spouse/significant other Available Help at Discharge: Family Type of Home: House Home Access: Stairs to enter Secretary/administrator of Steps: 4 Entrance Stairs-Rails: Right;None Home Layout: Multi-level     Bathroom Shower/Tub: Producer, Television/film/video: Standard     Home Equipment: Agricultural Consultant (2 wheels)          Prior Functioning/Environment Prior Level of Function : Independent/Modified Independent             Mobility Comments: amb with no AD ADLs Comments: MOD I-I in ADL/IADL, recently retired        OT Problem List: Decreased strength;Decreased activity tolerance;Impaired balance (sitting and/or standing)      OT Treatment/Interventions: Self-care/ADL training;Therapeutic exercise;Energy conservation;DME and/or AE instruction;Therapeutic activities;Patient/family education;Balance training    OT Goals(Current goals can be found in the care plan section) Acute Rehab OT Goals Patient Stated Goal: get stronger OT Goal Formulation: With patient Time For Goal Achievement: 08/17/23 Potential to Achieve Goals: Good ADL Goals Pt Will Perform Grooming: with modified independence;sitting;standing Pt Will Perform Lower Body Dressing: with modified independence;sit to/from stand;sitting/lateral leans Pt Will Transfer to Toilet: with modified independence;ambulating Pt Will Perform Toileting - Clothing Manipulation and hygiene: with modified independence;sitting/lateral leans;sit to/from stand  OT Frequency: Min 1X/week    Co-evaluation              AM-PAC OT 6 Clicks Daily Activity     Outcome Measure Help from another person eating meals?: None Help from another person taking care of personal grooming?: None Help from another person toileting, which includes using toliet, bedpan, or  urinal?: A Little Help from another person bathing (including washing, rinsing, drying)?: A Little Help from another person to put on and taking off regular upper body clothing?: None Help from another person to put on and taking off regular lower body clothing?: A Lot 6 Click Score: 20   End of Session Equipment Utilized During Treatment: Rolling walker (2 wheels) Nurse Communication: Mobility status  Activity Tolerance: Patient limited by pain Patient left: in chair;with call bell/phone within reach;with family/visitor present;with nursing/sitter in room  OT Visit Diagnosis: Other abnormalities of gait and mobility (R26.89);Muscle weakness (generalized) (M62.81)                Time: 8940-8876 OT Time Calculation (min): 24 min Charges:  OT General Charges $OT Visit: 1 Visit OT Evaluation $OT Eval Moderate Complexity: 1 Mod  Therisa Sheffield, OTD OTR/L  08/03/23, 1:06 PM

## 2023-08-03 NOTE — Progress Notes (Signed)
 PHARMACIST - PHYSICIAN COMMUNICATION  DR:   Jama  CONCERNING: IV to Oral Route Change Policy  RECOMMENDATION: This patient is receiving famotidine  by the intravenous route.  Based on criteria approved by the Pharmacy and Therapeutics Committee, the intravenous medication(s) is/are being converted to the equivalent oral dose form(s).   DESCRIPTION: These criteria include: The patient is eating (either orally or via tube) and/or has been taking other orally administered medications for a least 24 hours The patient has no evidence of active gastrointestinal bleeding or impaired GI absorption (gastrectomy, short bowel, patient on TNA or NPO).  If you have questions about this conversion, please contact the Pharmacy Department  []   [phone] )  [facility] [x]   [phone] )  [facility] []   [phone] )  [facility] []   [phone] )  [facility] []   [phone] )  [facility]   Adriana JONETTA Bolster, [facility] 08/03/2023 8:25 AM

## 2023-08-04 LAB — SURGICAL PATHOLOGY

## 2023-08-04 MED ORDER — DAPAGLIFLOZIN PROPANEDIOL 10 MG PO TABS
10.0000 mg | ORAL_TABLET | Freq: Every day | ORAL | Status: DC
  Administered 2023-08-04 – 2023-08-07 (×4): 10 mg via ORAL
  Filled 2023-08-04 (×4): qty 1

## 2023-08-04 NOTE — Progress Notes (Signed)
 Physical Therapy Treatment Patient Details Name: Clayton KRAY, PhD MRN: [id number] DOB: Jan 10, 1946 Today's Date: 08/04/2023   History of Present Illness 78 y/o s/p R common femoral artery to posterior tibial artery bypass on 08/02/23. PMH: CAD, PVD, HTN    PT Comments  Patient progressing towards physical therapy goals. Limited by fear of pain this date. Required maxA for sit to stand from EOB 2/2 patient not wanting to flex knees to bring feet under himself due to fear of pain despite max education and cues. Ambulated 62' with RW and CGA for safety. Limited knee flexion on L during swing phase due to fear of pain. Discharge plan remains appropriate.     If plan is discharge home, recommend the following: A little help with bathing/dressing/bathroom;Assistance with cooking/housework;Help with stairs or ramp for entrance;Assist for transportation   Can travel by private vehicle        Equipment Recommendations  Rolling Joedy Eickhoff (2 wheels)    Recommendations for Other Services       Precautions / Restrictions Precautions Precautions: Fall Precaution Comments: wound vac Restrictions Weight Bearing Restrictions Per Provider Order: No     Mobility  Bed Mobility Overal bed mobility: Needs Assistance Bed Mobility: Supine to Sit     Supine to sit: Contact guard     General bed mobility comments: extensive time required to come to sitting EOB    Transfers Overall transfer level: Needs assistance Equipment used: Rolling Candies Palm (2 wheels) Transfers: Sit to/from Stand Sit to Stand: Max assist           General transfer comment: despite max cueing and education, patient refusing to bend knees to place feet under himself to be able to stand without assistance.    Ambulation/Gait Ambulation/Gait assistance: Contact guard assist Gait Distance (Feet): 80 Feet Assistive device: Rolling Annye Forrey (2 wheels) Gait Pattern/deviations: Step-to pattern, Decreased stride length, Knee  flexed in stance - left, Knee flexed in stance - right Gait velocity: decreased     General Gait Details: patient with limited knee flexion on L during swing phase. Fearful of pain in L knee with flexion due to previous OT session   Stairs             Wheelchair Mobility     Tilt Bed    Modified Rankin (Stroke Patients Only)       Balance Overall balance assessment: Needs assistance Sitting-balance support: No upper extremity supported, Feet supported Sitting balance-Leahy Scale: Good     Standing balance support: Bilateral upper extremity supported, Reliant on assistive device for balance Standing balance-Leahy Scale: Fair                              Cognition Arousal: Alert Behavior During Therapy: WFL for tasks assessed/performed Overall Cognitive Status: Within Functional Limits for tasks assessed                                 General Comments: slow processing noted        Exercises      General Comments        Pertinent Vitals/Pain Pain Assessment Pain Assessment: Faces Faces Pain Scale: Hurts a little bit Pain Location: groin Pain Descriptors / Indicators: Sore Pain Intervention(s): Limited activity within patient's tolerance, Monitored during session    Home Living  Prior Function            PT Goals (current goals can now be found in the care plan section) Acute Rehab PT Goals Patient Stated Goal: to get stronger PT Goal Formulation: With patient/family Time For Goal Achievement: 08/17/23 Potential to Achieve Goals: Good Progress towards PT goals: Progressing toward goals    Frequency    Min 1X/week      PT Plan      Co-evaluation              AM-PAC PT 6 Clicks Mobility   Outcome Measure  Help needed turning from your back to your side while in a flat bed without using bedrails?: A Little Help needed moving from lying on your back to sitting on the  side of a flat bed without using bedrails?: A Little Help needed moving to and from a bed to a chair (including a wheelchair)?: A Little Help needed standing up from a chair using your arms (e.g., wheelchair or bedside chair)?: A Little Help needed to walk in hospital room?: A Little Help needed climbing 3-5 steps with a railing? : A Lot 6 Click Score: 17    End of Session Equipment Utilized During Treatment: Gait belt Activity Tolerance: Patient tolerated treatment well Patient left: in chair;with call bell/phone within reach;with family/visitor present Nurse Communication: Mobility status PT Visit Diagnosis: Unsteadiness on feet (R26.81);Muscle weakness (generalized) (M62.81);Other abnormalities of gait and mobility (R26.89)     Time: 8896-8875 PT Time Calculation (min) (ACUTE ONLY): 21 min  Charges:    $Therapeutic Activity: 8-22 mins PT General Charges $$ ACUTE PT VISIT: 1 Visit                     Maryanne Finder, PT, DPT Physical Therapist - [facility]  [facility]    Karanvir Balderston A Val Farnam 08/04/2023, 12:19 PM

## 2023-08-04 NOTE — Progress Notes (Signed)
   08/04/23 1330  Spiritual Encounters  Type of Visit Initial  Care provided to: Patient  Referral source Chaplain assessment  Reason for visit Routine spiritual support  Spiritual Framework  Presenting Themes Impactful experiences and emotions  Community/Connection Family (Wife was at bedside)  Interventions  Spiritual Care Interventions Made Reflective listening;Compassionate presence;Established relationship of care and support  Intervention Outcomes  Outcomes Connection to spiritual care;Awareness around self/spiritual resourses;Reduced anxiety   Introduction to chaplain services; Wife at bedside; Pt grateful and alert- used thank God in gratefulness.

## 2023-08-04 NOTE — Progress Notes (Signed)
 Progress Note    08/04/2023 7:49 AM 2 Days Post-Op  Subjective:  Clayton Powell is a 78 yo male now POD #2 from right femoral to tibial bypass surgery.  Patient is recovering as expected.  Patient is resting comfortably in bed in ICU morning.  Endorses using oxycodone  to sleep last night. Patient endorses that his legs feel better but more swollen today.  He does endorse soreness to both groins.  Dressings/Prevena Vac to right groin remain intact no complaints overnight.  Vitals all remained stable    Vitals:   08/04/23 0500 08/04/23 0600  BP: (!) 111/59 (!) 127/59  Pulse: 63 68  Resp: 17 (!) 24  Temp: 98.6 F (37 C)   SpO2: 91% 95%   Physical Exam: Cardiac:  RRR, normal S1 and S2, no murmurs appreciated. Lungs: Normal respiratory effort, clear on auscultation throughout but diminished at the bases. Incisions: Bilateral groin incisions clean dry and intact Praveena to right groin.  Multiple incisions to patient's left lower extremity as well as incision to right lower extremity.  All covered with cobblestone dressings with minimal drainage to them. Extremities: Bilateral lower extremities warm to touch with positive PT and DP Doppler pulses. Abdomen: Positive bowel sounds throughout, soft, nontender and nondistended. Neurologic: Alert and oriented x 3, answers all questions and follows commands appropriately.  CBC    Component Value Date/Time   WBC 6.6 08/03/2023 0933   RBC 3.27 (L) 08/03/2023 0933   HGB 9.6 (L) 08/03/2023 0933   HGB 15.1 05/12/2012 1240   HCT 29.4 (L) 08/03/2023 0933   HCT 44.7 05/12/2012 1240   PLT 177 08/03/2023 0933   PLT 222 05/12/2012 1240   MCV 89.9 08/03/2023 0933   MCV 91 05/12/2012 1240   MCH 29.4 08/03/2023 0933   MCHC 32.7 08/03/2023 0933   RDW 14.2 08/03/2023 0933   RDW 13.3 05/12/2012 1240   LYMPHSABS 1.6 08/03/2023 0933   MONOABS 1.3 (H) 08/03/2023 0933   EOSABS 0.1 08/03/2023 0933   BASOSABS 0.0 08/03/2023 0933    BMET     Component Value Date/Time   NA 131 (L) 08/03/2023 0933   NA 139 04/21/2023 0829   NA 140 05/12/2012 1240   K 4.0 08/03/2023 0933   K 4.2 05/12/2012 1240   CL 100 08/03/2023 0933   CL 107 05/12/2012 1240   CO2 22 08/03/2023 0933   CO2 26 05/12/2012 1240   GLUCOSE 104 (H) 08/03/2023 0933   GLUCOSE 101 (H) 05/12/2012 1240   BUN 25 (H) 08/03/2023 0933   BUN 20 04/21/2023 0829   BUN 21 (H) 05/12/2012 1240   CREATININE 1.11 08/03/2023 0933   CREATININE 0.86 05/12/2012 1240   CALCIUM  7.9 (L) 08/03/2023 0933   CALCIUM  8.9 05/12/2012 1240   GFRNONAA >60 08/03/2023 0933   GFRNONAA >60 05/12/2012 1240   GFRAA >60 05/12/2012 1240    INR    Component Value Date/Time   INR 1.0 05/13/2023 0846     Intake/Output Summary (Last 24 hours) at 08/04/2023 0749 Last data filed at 08/04/2023 0600 Gross per 24 hour  Intake --  Output 4050 ml  Net -4050 ml     Assessment/Plan:  78 y.o. male is s/p  right femoral to tibial bypass surgery  2 Days Post-Op   PLAN Pain medication PRN Continue to work with PT/OT Incentive Spirometry every hour while awake OOB to chair today DOAC Daily Transfer to the floor today  DVT prophylaxis:  ASA 81 mg daily and Plavix   75 mg daily    Gwendlyn JONELLE [facility] 08/04/2023 7:49 AM

## 2023-08-04 NOTE — Plan of Care (Signed)
  Problem: Education: Goal: Knowledge of General Education information will improve Description: Including pain rating scale, medication(s)/side effects and non-pharmacologic comfort measures Outcome: Progressing   Problem: Health Behavior/Discharge Planning: Goal: Ability to manage health-related needs will improve Outcome: Progressing   Problem: Clinical Measurements: Goal: Ability to maintain clinical measurements within normal limits will improve Outcome: Progressing Goal: Will remain free from infection Outcome: Progressing Goal: Respiratory complications will improve Outcome: Progressing Goal: Cardiovascular complication will be avoided Outcome: Progressing   Problem: Activity: Goal: Risk for activity intolerance will decrease Outcome: Progressing   Problem: Nutrition: Goal: Adequate nutrition will be maintained Outcome: Progressing   Problem: Coping: Goal: Level of anxiety will decrease Outcome: Progressing

## 2023-08-04 NOTE — TOC Progression Note (Addendum)
 Transition of Care [facility]) - Progression Note    Patient Details  Name: Clayton ALSIP, PhD MRN: [id number] Date of Birth: 11/08/1945  Transition of Care [facility]) CM/SW Contact  Jakia Kennebrew E Merlene Dante, LCSW Phone Number: 08/04/2023, 12:36 PM  Clinical Narrative:    CSW spoke with patient and spouse at bedside.  They state they have decided they want home health, and prefer [facility]. They confirm patient already has a RW, and they are declining a 3in1. CSW made referral to [facility] with [facility]. Asked MD if patient needs a home wound vac - MD confirms patient has Prevena disposable wound vac.    Expected Discharge Plan:  (TBD) Barriers to Discharge: Continued Medical Work up  Expected Discharge Plan and Services     Post Acute Care Choice:  (TBD) Living arrangements for the past 2 months: Single Family Home                                       Social Determinants of Health (SDOH) Interventions SDOH Screenings   Food Insecurity: No Food Insecurity (08/03/2023)  Housing: Low Risk  (08/04/2023)  Transportation Needs: No Transportation Needs (08/03/2023)  Utilities: Not At Risk (08/03/2023)  Alcohol Screen: Low Risk  (06/12/2023)  Depression (PHQ2-9): Low Risk  (06/13/2023)  Financial Resource Strain: Low Risk  (06/12/2023)  Physical Activity: Sufficiently Active (06/12/2023)  Social Connections: Socially Integrated (08/03/2023)  Stress: No Stress Concern Present (06/12/2023)  Tobacco Use: Low Risk  (08/02/2023)  Health Literacy: Adequate Health Literacy (03/14/2023)    Readmission Risk Interventions     No data to display

## 2023-08-04 NOTE — Progress Notes (Signed)
 I restarted Patient's home Farxiga 10 mg Daily, today at the patients request. Patient vital signs all remain stable.

## 2023-08-05 NOTE — Plan of Care (Signed)
  Problem: Education: Goal: Knowledge of General Education information will improve Description: Including pain rating scale, medication(s)/side effects and non-pharmacologic comfort measures Outcome: Progressing   Problem: Health Behavior/Discharge Planning: Goal: Ability to manage health-related needs will improve Outcome: Progressing   Problem: Clinical Measurements: Goal: Ability to maintain clinical measurements within normal limits will improve Outcome: Progressing Goal: Will remain free from infection Outcome: Progressing Goal: Diagnostic test results will improve Outcome: Progressing Goal: Respiratory complications will improve Outcome: Progressing Goal: Cardiovascular complication will be avoided Outcome: Progressing   Problem: Activity: Goal: Risk for activity intolerance will decrease Outcome: Progressing   Problem: Nutrition: Goal: Adequate nutrition will be maintained Outcome: Progressing   Problem: Coping: Goal: Level of anxiety will decrease Outcome: Progressing   Problem: Elimination: Goal: Will not experience complications related to bowel motility Outcome: Progressing Goal: Will not experience complications related to urinary retention Outcome: Progressing   Problem: Pain Management: Goal: General experience of comfort will improve Outcome: Progressing   Problem: Safety: Goal: Ability to remain free from injury will improve Outcome: Progressing   Problem: Skin Integrity: Goal: Risk for impaired skin integrity will decrease Outcome: Progressing   Problem: Education: Goal: Knowledge of prescribed regimen will improve Outcome: Progressing   Problem: Activity: Goal: Ability to tolerate increased activity will improve Outcome: Progressing   Problem: Bowel/Gastric: Goal: Gastrointestinal status for postoperative course will improve Outcome: Progressing   Problem: Clinical Measurements: Goal: Postoperative complications will be avoided or  minimized Outcome: Progressing Goal: Signs and symptoms of graft occlusion will improve Outcome: Progressing   Problem: Skin Integrity: Goal: Demonstration of wound healing without infection will improve Outcome: Progressing

## 2023-08-05 NOTE — Progress Notes (Signed)
 Pt transfer to [facility] room 203. Report given to [facility].

## 2023-08-05 NOTE — Plan of Care (Signed)
  Problem: Clinical Measurements: Goal: Ability to maintain clinical measurements within normal limits will improve Outcome: Progressing Goal: Will remain free from infection Outcome: Progressing Goal: Respiratory complications will improve Outcome: Progressing   Problem: Activity: Goal: Risk for activity intolerance will decrease Outcome: Progressing   Problem: Nutrition: Goal: Adequate nutrition will be maintained Outcome: Progressing   Problem: Elimination: Goal: Will not experience complications related to urinary retention Outcome: Progressing   Problem: Pain Management: Goal: General experience of comfort will improve Outcome: Progressing

## 2023-08-05 NOTE — Progress Notes (Signed)
      Daily Progress Note   Assessment/Planning:   POD #3 s/p R CFA to PTA w/ L GSV, TPT and pero EA  Still having some difficulty with ambulation Home once pain and ambulation better Weather may keep pt here through the weekend   Subjective  - 3 Days Post-Op   Pain control ok, worse with ambulation, somewhat limited ambulation   Objective   Vitals:   08/04/23 1622 08/04/23 2015 08/05/23 0922 08/05/23 1003  BP: 119/64 128/63 117/64 (!) 105/54  Pulse: 70 71 79 73  Resp: 18 20 18 18   Temp: 97.8 F (36.6 C) 98.1 F (36.7 C) 97.9 F (36.6 C) 98.2 F (36.8 C)  TempSrc: Oral Oral Oral   SpO2: 95% 93% 94% 94%  Weight:      Height:         Intake/Output Summary (Last 24 hours) at 08/05/2023 1012 Last data filed at 08/04/2023 1825 Gross per 24 hour  Intake 120 ml  Output 125 ml  Net -5 ml    PULM  CTAB  CV  RRR  GI  soft, NTND  VASC L groin: vein harvest incision dressed, some serosang drainage from proximal incision; R groin: VAC adherent; R calf incision: inc dressed, no active bleeding  NEURO Sensation and motor intact    Laboratory   CBC    Latest Ref Rng & Units 08/03/2023    9:33 AM 08/02/2023   11:40 PM 08/02/2023    3:41 PM  CBC  WBC 4.0 - 10.5 K/uL 6.6  10.2  14.0   Hemoglobin 13.0 - 17.0 g/dL 9.6  89.1  86.6   Hematocrit 39.0 - 52.0 % 29.4  32.2  40.9   Platelets 150 - 400 K/uL 177  201  219     BMET    Component Value Date/Time   NA 131 (L) 08/03/2023 0933   NA 139 04/21/2023 0829   NA 140 05/12/2012 1240   K 4.0 08/03/2023 0933   K 4.2 05/12/2012 1240   CL 100 08/03/2023 0933   CL 107 05/12/2012 1240   CO2 22 08/03/2023 0933   CO2 26 05/12/2012 1240   GLUCOSE 104 (H) 08/03/2023 0933   GLUCOSE 101 (H) 05/12/2012 1240   BUN 25 (H) 08/03/2023 0933   BUN 20 04/21/2023 0829   BUN 21 (H) 05/12/2012 1240   CREATININE 1.11 08/03/2023 0933   CREATININE 0.86 05/12/2012 1240   CALCIUM  7.9 (L) 08/03/2023 0933   CALCIUM  8.9 05/12/2012 1240    GFRNONAA >60 08/03/2023 0933   GFRNONAA >60 05/12/2012 1240   GFRAA >60 05/12/2012 1240     Redell Door, MD, FACS, FSVS Covering for [facility]: [phone]  08/05/2023, 10:12 AM

## 2023-08-06 LAB — GLUCOSE, CAPILLARY: Glucose-Capillary: 129 mg/dL — ABNORMAL HIGH (ref 70–99)

## 2023-08-06 NOTE — Progress Notes (Signed)
 Dressings changed to BLE.  Cornell Barman Camillo Quadros

## 2023-08-06 NOTE — Progress Notes (Signed)
 Physical Therapy Treatment Patient Details Name: Clayton GRZELAK, PhD MRN: [id number] DOB: 1946/03/04 Today's Date: 08/06/2023   History of Present Illness 78 y/o s/p R common femoral artery to posterior tibial artery bypass on 08/02/23. PMH: CAD, PVD, HTN    PT Comments  Pt to EOB with rails and no assist.  Steady in sitting for vascular MD consult.  Dressings removed by MD and covered with gauze due to some bleeding that did persist during gait.  RN aware and will address.  He is able to stand with min a x 1 and walk 120' with up/down 8 steps with B rails with cga/supervision.  Upon finishing stairs, pt did c/o dizziness 11/10 and chair is brought.  BP 109/65  P 64.  He stated he felt like it was triggered by exertion from stairs.  Remained in chair after session for breakfast.  Overall does well with mobility.  Wife is retired [employer] who he stated will be able to help at home.   If plan is discharge home, recommend the following: A little help with bathing/dressing/bathroom;Assistance with cooking/housework;Help with stairs or ramp for entrance;Assist for transportation   Can travel by private vehicle        Equipment Recommendations  Rolling walker (2 wheels)    Recommendations for Other Services       Precautions / Restrictions Precautions Precautions: Fall Precaution Comments: wound vac Restrictions Weight Bearing Restrictions Per Provider Order: No     Mobility  Bed Mobility Overal bed mobility: Needs Assistance Bed Mobility: Supine to Sit     Supine to sit: Contact guard, Used rails          Transfers Overall transfer level: Needs assistance Equipment used: Rolling walker (2 wheels) Transfers: Sit to/from Stand Sit to Stand: Min assist                Ambulation/Gait Ambulation/Gait assistance: Contact guard assist Gait Distance (Feet): 120 Feet Assistive device: Rolling walker (2 wheels)   Gait velocity: decreased         Stairs Stairs:  Yes Stairs assistance: Contact guard assist Stair Management: Two rails Number of Stairs: 8 General stair comments: does well   Wheelchair Mobility     Tilt Bed    Modified Rankin (Stroke Patients Only)       Balance Overall balance assessment: Needs assistance Sitting-balance support: Feet supported Sitting balance-Leahy Scale: Good     Standing balance support: Bilateral upper extremity supported, Reliant on assistive device for balance Standing balance-Leahy Scale: Fair                              Cognition Arousal: Alert Behavior During Therapy: WFL for tasks assessed/performed Overall Cognitive Status: Within Functional Limits for tasks assessed                                 General Comments: slow processing noted        Exercises      General Comments        Pertinent Vitals/Pain Pain Assessment Pain Assessment: Faces Faces Pain Scale: Hurts a little bit Pain Location: groin Pain Descriptors / Indicators: Sore Pain Intervention(s): Limited activity within patient's tolerance, Monitored during session    Home Living  Prior Function            PT Goals (current goals can now be found in the care plan section) Progress towards PT goals: Progressing toward goals    Frequency    Min 1X/week      PT Plan      Co-evaluation              AM-PAC PT 6 Clicks Mobility   Outcome Measure  Help needed turning from your back to your side while in a flat bed without using bedrails?: A Little Help needed moving from lying on your back to sitting on the side of a flat bed without using bedrails?: A Little Help needed moving to and from a bed to a chair (including a wheelchair)?: A Little Help needed standing up from a chair using your arms (e.g., wheelchair or bedside chair)?: A Little Help needed to walk in hospital room?: A Little Help needed climbing 3-5 steps with a  railing? : A Little 6 Click Score: 18    End of Session Equipment Utilized During Treatment: Gait belt Activity Tolerance: Patient tolerated treatment well Patient left: in chair;with call bell/phone within reach;with chair alarm set Nurse Communication: Mobility status PT Visit Diagnosis: Unsteadiness on feet (R26.81);Muscle weakness (generalized) (M62.81);Other abnormalities of gait and mobility (R26.89)     Time: 9176-9145 PT Time Calculation (min) (ACUTE ONLY): 31 min  Charges:    $Gait Training: 23-37 mins PT General Charges $$ ACUTE PT VISIT: 1 Visit                   Lauraine Gills, PTA 08/06/23, 10:12 AM

## 2023-08-06 NOTE — Progress Notes (Addendum)
      Daily Progress Note   Assessment/Planning:   POD #4 s/p R CFA to PTA w/ L GSV, TPT and pero EA   Drainage from both legs not unexpected Will apply ABD pad and ACE bandages  Pt did not ambulate much yesterday Pt requests delaying discharge until tomorrow to see how he ambulates today   Subjective  - 4 Days Post-Op   No complaints, did not ambulate much yesterday   Objective   Vitals:   08/05/23 1556 08/05/23 1959 08/06/23 0413 08/06/23 0752  BP: (!) 118/55 124/67 105/64 112/61  Pulse: (!) 52 66 71 62  Resp: 18 16 20 18   Temp: 98 F (36.7 C) 97.8 F (36.6 C) 97.9 F (36.6 C) 98 F (36.7 C)  TempSrc:  Oral Oral Oral  SpO2: 91% 97% 96% 96%  Weight:      Height:         Intake/Output Summary (Last 24 hours) at 08/06/2023 0857 Last data filed at 08/06/2023 0415 Gross per 24 hour  Intake --  Output 550 ml  Net -550 ml    PULM  CTAB  CV  RRR  GI  soft, NTND  VASC R groin: adherent VAC, R calf: incision c/I, some venous drainage; RLE: 1-2+ edema, warm foot, faintly palpable PT > DP L leg: multiple areas of venous drainage, L groin hematoma, 1-2+ edema  NEURO Intact motor and sensation in both legs    Laboratory   CBC    Latest Ref Rng & Units 08/03/2023    9:33 AM 08/02/2023   11:40 PM 08/02/2023    3:41 PM  CBC  WBC 4.0 - 10.5 K/uL 6.6  10.2  14.0   Hemoglobin 13.0 - 17.0 g/dL 9.6  89.1  86.6   Hematocrit 39.0 - 52.0 % 29.4  32.2  40.9   Platelets 150 - 400 K/uL 177  201  219     BMET    Component Value Date/Time   NA 131 (L) 08/03/2023 0933   NA 139 04/21/2023 0829   NA 140 05/12/2012 1240   K 4.0 08/03/2023 0933   K 4.2 05/12/2012 1240   CL 100 08/03/2023 0933   CL 107 05/12/2012 1240   CO2 22 08/03/2023 0933   CO2 26 05/12/2012 1240   GLUCOSE 104 (H) 08/03/2023 0933   GLUCOSE 101 (H) 05/12/2012 1240   BUN 25 (H) 08/03/2023 0933   BUN 20 04/21/2023 0829   BUN 21 (H) 05/12/2012 1240   CREATININE 1.11 08/03/2023 0933   CREATININE 0.86  05/12/2012 1240   CALCIUM  7.9 (L) 08/03/2023 0933   CALCIUM  8.9 05/12/2012 1240   GFRNONAA >60 08/03/2023 0933   GFRNONAA >60 05/12/2012 1240   GFRAA >60 05/12/2012 1240     Redell Door, MD, FACS, FSVS Covering for [facility]: [phone]  08/06/2023, 8:57 AM   Addendum  Came back.  Oozing better, but seal on Prevena lost.  I removed the Prevena.  Inc clean, staples in place.  Some mild venous ooze.  Sterile dressing applied.  - able to ambulate with walker - Suspect will ready for discharge tomorrow

## 2023-08-06 NOTE — Progress Notes (Signed)
 Patient ambulating with PT, bilateral incisions noted weeping.   61 Went to change dressings, patient wanted to eat breakfast first. Room/bed resituated, Table moved to the opposite side of the chair per patient request for patient convenience with meal.  0915 Went to change dressings, Patient stated he is feeling weak and dizzy. Writer stayed with the patient, changed bed linens. Requested tech to obtain vital signs. Vitals normal blood sugar 129.  0945 Went to change dressings, patient stated he needs to urinate in urinal, provided a towel and urinal  per patient request. Provided privacy for urination. Call light in reach.  1015 Will attempt again to change dressings.  Magdelena Kinsella V Minami Arriaga

## 2023-08-07 MED ORDER — OXYCODONE-ACETAMINOPHEN 5-325 MG PO TABS: 1.0000 | ORAL_TABLET | ORAL | 0 refills | Status: DC | PRN

## 2023-08-07 MED ORDER — ASPIRIN 81 MG PO TBEC: 81.0000 mg | DELAYED_RELEASE_TABLET | Freq: Every day | ORAL | 12 refills | Status: DC

## 2023-08-07 NOTE — Discharge Instructions (Addendum)
 Do not lift anything heavy.  Do not lift anything weighing more than a gallon of milk  Do not drive until your staples are removed  Bilateral lower leg dressings as well as groin dressings to be changed daily.  Groin dressings may need to be changed more than once a day if they become moist.  Dressing changes consist of 4 x 4's covered with ABD pads taped in place in bilateral groins.  Lower extremities are 4 x 4's covered with ABDs over staples wrapped with Kerlix.  You will be discharged on aspirin  81 mg daily and Plavix  75 mg daily and Crestor  40 mg daily. Please do not miss taking a dose of these medications as this will effect the outcome of your surgery.  Follow up in Vein and Vascular Surgery as scheduled.

## 2023-08-07 NOTE — TOC Transition Note (Signed)
 Transition of Care [facility]) - Discharge Note   Patient Details  Name: Clayton FARVE, PhD MRN: [id number] Date of Birth: 1946/04/15  [facility] CM/SW Contact:  Corean ONEIDA Haddock, RN Phone Number: 08/07/2023, 3:24 PM   Clinical Narrative:     Patient to discharge today [name] with [facility] notified of discharge    Barriers to Discharge: Continued Medical Work up   Patient Goals and CMS Choice            Discharge Placement                       Discharge Plan and Services Additional resources added to the After Visit Summary for       Post Acute Care Choice:  (TBD)                               Social Drivers of Health (SDOH) Interventions SDOH Screenings   Food Insecurity: No Food Insecurity (08/03/2023)  Housing: Low Risk  (08/04/2023)  Transportation Needs: No Transportation Needs (08/03/2023)  Utilities: Not At Risk (08/03/2023)  Alcohol Screen: Low Risk  (06/12/2023)  Depression (PHQ2-9): Low Risk  (06/13/2023)  Financial Resource Strain: Low Risk  (06/12/2023)  Physical Activity: Sufficiently Active (06/12/2023)  Social Connections: Socially Integrated (08/03/2023)  Stress: No Stress Concern Present (06/12/2023)  Tobacco Use: Low Risk  (08/02/2023)  Health Literacy: Adequate Health Literacy (03/14/2023)     Readmission Risk Interventions     No data to display

## 2023-08-07 NOTE — Progress Notes (Signed)
 Occupational Therapy Treatment Patient Details Name: Clayton BRULL, PhD MRN: [id number] DOB: [birth year] Today's Date: 08/07/2023   History of present illness 78 y/o s/p R common femoral artery to posterior tibial artery bypass on 08/02/23. PMH: CAD, PVD, HTN   OT comments  Pt is supine in bed on arrival. Pleasant, but eager to return home and agreeable to OT session with motivation. He denies pain. Pt performed bed mobility from supine with SUP. STS from EOB to RW with CGA and ambulated to the bathroom and back using RW with SBA. He demo ability to stand at toilet to urinate and perform hand hygiene and oral care standing at sink with unilateral support and SUP with no LOB throughout session. Edu pt on following MD's DC instructions for showering upon return home and he verbalized understanding. Pt returned to bed with all needs in place and will cont to require skilled acute OT services to maximize his safety and IND to return to PLOF.       If plan is discharge home, recommend the following:  A little help with walking and/or transfers;A little help with bathing/dressing/bathroom;Assistance with cooking/housework;Help with stairs or ramp for entrance;Assist for transportation   Equipment Recommendations  None recommended by OT    Recommendations for Other Services      Precautions / Restrictions Precautions Precautions: Fall Precaution Comments: wound vac Restrictions Weight Bearing Restrictions Per Provider Order: No       Mobility Bed Mobility Overal bed mobility: Needs Assistance Bed Mobility: Supine to Sit, Sit to Supine     Supine to sit: Supervision Sit to supine: Supervision   General bed mobility comments: SUP for bed mobility with bed in supine position to simluate home environment    Transfers Overall transfer level: Needs assistance Equipment used: Rolling walker (2 wheels) Transfers: Sit to/from Stand Sit to Stand: Contact guard assist            General transfer comment: CGA for STS from EOB and ambulation to the bathroom using RW with SBA     Balance Overall balance assessment: Needs assistance Sitting-balance support: Feet supported       Standing balance support: Single extremity supported, During functional activity Standing balance-Leahy Scale: Good Standing balance comment: no LOB during oral care or washing hands or standing at toilet to urinate                           ADL either performed or assessed with clinical judgement   ADL Overall ADL's : Needs assistance/impaired     Grooming: Oral care;Standing;Supervision/safety;Wash/dry Lawyer: Radiographer, Therapeutic Details (indicate cue type and reason): stood at toilet to urinate                Extremity/Trunk Assessment              Vision       Perception     Praxis      Cognition Arousal: Alert Behavior During Therapy: WFL for tasks assessed/performed Overall Cognitive Status: Within Functional Limits for tasks assessed                                          Exercises      Shoulder Instructions  General Comments      Pertinent Vitals/ Pain       Pain Assessment Pain Assessment: No/denies pain Pain Intervention(s): Monitored during session  Home Living                                          Prior Functioning/Environment              Frequency  Min 1X/week        Progress Toward Goals  OT Goals(current goals can now be found in the care plan section)  Progress towards OT goals: Progressing toward goals  Acute Rehab OT Goals Patient Stated Goal: improve strength OT Goal Formulation: With patient Time For Goal Achievement: 08/17/23 Potential to Achieve Goals: Good  Plan      Co-evaluation                 AM-PAC OT 6 Clicks Daily Activity     Outcome Measure   Help from another person  eating meals?: None Help from another person taking care of personal grooming?: None Help from another person toileting, which includes using toliet, bedpan, or urinal?: A Little Help from another person bathing (including washing, rinsing, drying)?: A Little Help from another person to put on and taking off regular upper body clothing?: None Help from another person to put on and taking off regular lower body clothing?: A Lot 6 Click Score: 20    End of Session Equipment Utilized During Treatment: Rolling walker (2 wheels)  OT Visit Diagnosis: Other abnormalities of gait and mobility (R26.89);Muscle weakness (generalized) (M62.81)   Activity Tolerance Patient tolerated treatment well   Patient Left with family/visitor present;in bed;with call bell/phone within reach;with bed alarm set   Nurse Communication Mobility status        Time: 1400-1416 OT Time Calculation (min): 16 min  Charges: OT General Charges $OT Visit: 1 Visit OT Treatments $Self Care/Home Management : 8-22 mins  Alaster Asfaw, OTR/L  08/07/23, 2:46 PM   Meklit Cotta E Jazmin Ley 08/07/2023, 2:45 PM

## 2023-08-07 NOTE — Progress Notes (Signed)
 Mobility Specialist - Progress Note   08/07/23 1132  Mobility  Activity Transferred from chair to bed  Level of Assistance Standby assist, set-up cues, supervision of patient - no hands on  Assistive Device None  Distance Ambulated (ft) 4 ft  Activity Response Tolerated well  Mobility visit 1 Mobility  Mobility Specialist Start Time (ACUTE ONLY) 1126  Mobility Specialist Stop Time (ACUTE ONLY) 1131  Mobility Specialist Time Calculation (min) (ACUTE ONLY) 5 min   America Silvan Mobility Specialist 08/07/23 11:34 AM

## 2023-08-07 NOTE — Progress Notes (Signed)
 Physical Therapy Treatment Patient Details Name: Clayton UTTER, PhD MRN: [id number] DOB: 04-Jul-1946 Today's Date: 08/07/2023   History of Present Illness 78 y/o s/p R common femoral artery to posterior tibial artery bypass on 08/02/23. PMH: CAD, PVD, HTN    PT Comments  Pt oob and completes x 1 lap with RW and cga x 1.  No dizziness or issues today. Feels comfortable with gait and discharge home.  He has equipment.  Wife able to provide +1 assist as needed per pt.   If plan is discharge home, recommend the following: A little help with bathing/dressing/bathroom;Assistance with cooking/housework;Help with stairs or ramp for entrance;Assist for transportation   Can travel by private vehicle        Equipment Recommendations  Rolling walker (2 wheels)    Recommendations for Other Services       Precautions / Restrictions Precautions Precautions: Fall Precaution Comments: wound vac Restrictions Weight Bearing Restrictions Per Provider Order: No     Mobility  Bed Mobility Overal bed mobility: Needs Assistance Bed Mobility: Supine to Sit     Supine to sit: Contact guard, Used rails       Patient Response: Cooperative  Transfers Overall transfer level: Needs assistance Equipment used: Rolling walker (2 wheels) Transfers: Sit to/from Stand Sit to Stand: Min assist           General transfer comment: from lower bed height to simulate home.  recommend +1 at home from wife who he stated can help her.    Ambulation/Gait Ambulation/Gait assistance: Contact guard assist Gait Distance (Feet): 200 Feet Assistive device: Rolling walker (2 wheels) Gait Pattern/deviations: Step-to pattern, Decreased stride length, Knee flexed in stance - left, Knee flexed in stance - right Gait velocity: decreased         Stairs             Wheelchair Mobility     Tilt Bed Tilt Bed Patient Response: Cooperative  Modified Rankin (Stroke Patients Only)       Balance  Overall balance assessment: Needs assistance Sitting-balance support: Feet supported Sitting balance-Leahy Scale: Good     Standing balance support: Bilateral upper extremity supported, Reliant on assistive device for balance Standing balance-Leahy Scale: Fair                              Cognition Arousal: Alert Behavior During Therapy: WFL for tasks assessed/performed Overall Cognitive Status: Within Functional Limits for tasks assessed                                          Exercises      General Comments        Pertinent Vitals/Pain Pain Assessment Pain Assessment: Faces Faces Pain Scale: Hurts a little bit Pain Descriptors / Indicators: Tightness Pain Intervention(s): Limited activity within patient's tolerance, Monitored during session, Repositioned    Home Living                          Prior Function            PT Goals (current goals can now be found in the care plan section) Progress towards PT goals: Progressing toward goals    Frequency    Min 1X/week      PT Plan  Co-evaluation              AM-PAC PT 6 Clicks Mobility   Outcome Measure  Help needed turning from your back to your side while in a flat bed without using bedrails?: None Help needed moving from lying on your back to sitting on the side of a flat bed without using bedrails?: None Help needed moving to and from a bed to a chair (including a wheelchair)?: A Little Help needed standing up from a chair using your arms (e.g., wheelchair or bedside chair)?: A Little Help needed to walk in hospital room?: A Little Help needed climbing 3-5 steps with a railing? : A Little 6 Click Score: 20    End of Session Equipment Utilized During Treatment: Gait belt Activity Tolerance: Patient tolerated treatment well Patient left: in chair;with call bell/phone within reach;with chair alarm set Nurse Communication: Mobility status PT Visit  Diagnosis: Unsteadiness on feet (R26.81);Muscle weakness (generalized) (M62.81);Other abnormalities of gait and mobility (R26.89)     Time: 9095-9079 PT Time Calculation (min) (ACUTE ONLY): 16 min  Charges:    $Gait Training: 8-22 mins PT General Charges $$ ACUTE PT VISIT: 1 Visit                   Lauraine Gills, PTA 08/07/23, 9:29 AM

## 2023-08-07 NOTE — Discharge Summary (Signed)
 [facility] VEIN SPECIALISTS    Discharge Summary    Patient ID:  Clayton THERIEN, PhD MRN: [id number] DOB/AGE: [birth year] 78 y.o.  Admit date: 08/02/2023 Discharge date: 08/07/2023 Date of Surgery: 08/02/2023 Surgeon: Surgeon(s): Schnier, Cordella MATSU, MD Marea Selinda RAMAN, MD  Admission Diagnosis: Atherosclerosis of artery of extremity with rest pain [facility]) [I70.229]  Discharge Diagnoses:  Atherosclerosis of artery of extremity with rest pain [facility]) [I70.229]  Secondary Diagnoses: Past Medical History:  Diagnosis Date   Acute gastroenteritis    Allergy    Anginal pain (HCC)    Arthritis    Atherosclerosis of native arteries of extremity with intermittent claudication (HCC)    Bilateral high frequency sensorineural hearing loss    BPH (benign prostatic hypertrophy) 12/2005   Bruxism    CAD (coronary artery disease) 2010   dx made by coronary calcium  scoring, Normal stress test in 2010.    Decreased dorsalis pedis pulse    ETD (eustachian tube dysfunction)    Gallstones    GERD (gastroesophageal reflux disease) 06/2004   HLD (hyperlipidemia) 09/1997   Hyperlipidemia, mixed    Incomplete bladder emptying    Left rotator cuff tear 08/12/2011   Per Dr. Josefina 2013 L rotator cuff tear on MRI 2013    Metabolic syndrome X    Peripheral vascular disease (HCC)    Prostatitis, chronic    Right rotator cuff tear 05/24/2013   Unstable angina (HCC)    Vasomotor rhinitis    Wears glasses     Procedure(s): BYPASS GRAFT FEMORAL-TIBIAL ARTERY VEIN HARVEST APPLICATION OF CELL SAVER  Discharged Condition: good  HPI:  Clayton Powell is a 78 year old male now postop day 5 from a right femoral to tibial bypass graft surgery with a left lower extremity saphenous vein graft.  Patient has bilateral groin incisions with dressings clean dry and intact.  No hematoma seroma or infection to note.  Patient patient has 2 incisions to the left lower extremity due to left saphenous vein  graft.  These are clean dry and intact with staples no hematoma seroma or infection to note.  Right lower extremity has incision closed with staples.  No hematoma seroma or infection to note as well.  Patient does have bilateral lower +1 edema which is part of the recovery process from this surgery.  Patient is recovering as expected.  Patient will be sent home with home health nursing for dressing changes and home health physical therapy for strengthening.  Patient to be discharged on aspirin  81 mg daily, Plavix  75 mg daily, and Crestor  40 mg daily.  He has been instructed to not miss any of these medications as it may change the outcome of his surgical procedure.  He verbalizes understanding.  Patient to be discharged home today 08/07/2023.  Hospital Course:  Clayton Powell Salt, PhD is a 78 y.o. male is S/P Right Femoral to tibial bypass graft with left lower extremity vein graft.  Extubated: POD # 0 Physical Exam:  Alert notes x3, no acute distress Face: Symmetrical.  Tongue is midline. Neck: Trachea is midline.  No swelling or bruising. Cardiovascular: Regular rate and rhythm Pulmonary: Clear to auscultation bilaterally Abdomen: Soft, nontender, nondistended Right groin access: Clean dry and intact.  No swelling or drainage noted Left groin access: Clean dry and intact.  No swelling or drainage noted Left lower extremity: Thigh soft.  Calf soft.  Extremities warm distally toes.  Hard to palpate pedal pulses however the foot is warm is her good  capillary refill. Right lower extremity: Thigh soft.  Calf soft.  Extremities warm distally toes.  Hard to palpate pedal pulses however the foot is warm is her good capillary refill. Neurological: No deficits noted   Post-op wounds:  clean, dry, intact or healing well  Pt. Ambulating, voiding and taking PO diet without difficulty. Pt pain controlled with PO pain meds.  Labs:  As below  Complications: none  Consults:    Significant  Diagnostic Studies: CBC Lab Results  Component Value Date   WBC 6.6 08/03/2023   HGB 9.6 (L) 08/03/2023   HCT 29.4 (L) 08/03/2023   MCV 89.9 08/03/2023   PLT 177 08/03/2023    BMET    Component Value Date/Time   NA 131 (L) 08/03/2023 0933   NA 139 04/21/2023 0829   NA 140 05/12/2012 1240   K 4.0 08/03/2023 0933   K 4.2 05/12/2012 1240   CL 100 08/03/2023 0933   CL 107 05/12/2012 1240   CO2 22 08/03/2023 0933   CO2 26 05/12/2012 1240   GLUCOSE 104 (H) 08/03/2023 0933   GLUCOSE 101 (H) 05/12/2012 1240   BUN 25 (H) 08/03/2023 0933   BUN 20 04/21/2023 0829   BUN 21 (H) 05/12/2012 1240   CREATININE 1.11 08/03/2023 0933   CREATININE 0.86 05/12/2012 1240   CALCIUM  7.9 (L) 08/03/2023 0933   CALCIUM  8.9 05/12/2012 1240   GFRNONAA >60 08/03/2023 0933   GFRNONAA >60 05/12/2012 1240   GFRAA >60 05/12/2012 1240   COAG Lab Results  Component Value Date   INR 1.0 05/13/2023   INR 1.4 (H) 05/29/2020   INR 1.1 05/28/2020     Disposition:  Discharge to :Home  Allergies as of 08/07/2023       Reactions   Ciprofloxacin Hcl Rash   Quinolones Rash        Medication List     TAKE these medications    acetaminophen  500 MG tablet Commonly known as: TYLENOL  Take 1,000 mg by mouth every 6 (six) hours as needed for moderate pain or mild pain.   aspirin  EC 81 MG tablet Take 1 tablet (81 mg total) by mouth daily at 6 (six) AM. Swallow whole. Start taking on: August 08, 2023   Azelastine -Fluticasone  137-50 MCG/ACT Susp Place 1 spray into both nostrils daily as needed (Congestion).   CALCIUM  CITRATE PO Take 600 mg by mouth in the morning and at bedtime.   clopidogrel  75 MG tablet Commonly known as: PLAVIX  TAKE 1 TABLET(75 MG) BY MOUTH DAILY   dapagliflozin  propanediol 10 MG Tabs tablet Commonly known as: Farxiga  Take 1 tablet (10 mg total) by mouth daily before breakfast.   Doans Extra Strength 580 MG Tabs Generic drug: Magnesium  Salicylate Tetrahyd Take 2  tablets by mouth as needed (Pain).   icosapent  Ethyl 1 g capsule Commonly known as: VASCEPA  Take 1 capsule (1 g total) by mouth 4 (four) times daily.   multivitamin capsule Take 1 capsule by mouth daily.   oxyCODONE -acetaminophen  5-325 MG tablet Commonly known as: PERCOCET/ROXICET Take 1-2 tablets by mouth every 4 (four) hours as needed for moderate pain (pain score 4-6).   rosuvastatin  40 MG tablet Commonly known as: CRESTOR  Take 1 tablet (40 mg total) by mouth daily.   Symbicort 80-4.5 MCG/ACT inhaler Generic drug: budesonide-formoterol  Inhale 2 puffs into the lungs 2 (two) times daily as needed (shortness of breath).   tadalafil  5 MG tablet Commonly known as: CIALIS  Take 5 mg by mouth daily.   Vitamin B-12  5000 MCG Subl Place 5,000 mcg under the tongue daily.   Vitamin D  50 MCG (2000 UT) Caps Take 2,000 Units by mouth daily.       Verbal and written Discharge instructions given to the patient. Wound care per Discharge AVS  Follow-up Information     Schnier, Cordella MATSU, MD Follow up in 3 week(s).   Specialties: Vascular Surgery, Cardiology, Radiology, Vascular Surgery Why: Staple removal with Bilateral lower extremity Ultrasound with ABI's Contact information: [address] [place] [address] [phone]                 Signed: Gwendlyn JONELLE Shank, NP  08/07/2023, 10:38 AM

## 2023-08-14 ENCOUNTER — Telehealth (INDEPENDENT_AMBULATORY_CARE_PROVIDER_SITE_OTHER): Payer: Self-pay

## 2023-08-14 NOTE — Telephone Encounter (Signed)
Patient stated that after his procedure on 08/02/23 he was told to be seen in 3 weeks. He stated he is schedule for 4 weeks. He would like to know if that is okay.   Please advise

## 2023-08-15 NOTE — Telephone Encounter (Signed)
That is ok,

## 2023-08-15 NOTE — Telephone Encounter (Signed)
Patient notified

## 2023-08-17 DIAGNOSIS — K8020 Calculus of gallbladder without cholecystitis without obstruction: Secondary | ICD-10-CM

## 2023-08-17 DIAGNOSIS — K219 Gastro-esophageal reflux disease without esophagitis: Secondary | ICD-10-CM

## 2023-08-17 DIAGNOSIS — E782 Mixed hyperlipidemia: Secondary | ICD-10-CM

## 2023-08-17 DIAGNOSIS — H903 Sensorineural hearing loss, bilateral: Secondary | ICD-10-CM

## 2023-08-17 DIAGNOSIS — M75102 Unspecified rotator cuff tear or rupture of left shoulder, not specified as traumatic: Secondary | ICD-10-CM

## 2023-08-17 DIAGNOSIS — N400 Enlarged prostate without lower urinary tract symptoms: Secondary | ICD-10-CM

## 2023-08-17 DIAGNOSIS — Z48812 Encounter for surgical aftercare following surgery on the circulatory system: Secondary | ICD-10-CM | POA: Diagnosis not present

## 2023-08-17 DIAGNOSIS — E8881 Metabolic syndrome: Secondary | ICD-10-CM

## 2023-08-17 DIAGNOSIS — I70229 Atherosclerosis of native arteries of extremities with rest pain, unspecified extremity: Secondary | ICD-10-CM | POA: Diagnosis not present

## 2023-08-17 DIAGNOSIS — J45909 Unspecified asthma, uncomplicated: Secondary | ICD-10-CM | POA: Diagnosis not present

## 2023-08-17 DIAGNOSIS — I2510 Atherosclerotic heart disease of native coronary artery without angina pectoris: Secondary | ICD-10-CM | POA: Diagnosis not present

## 2023-08-17 DIAGNOSIS — M75101 Unspecified rotator cuff tear or rupture of right shoulder, not specified as traumatic: Secondary | ICD-10-CM

## 2023-08-28 ENCOUNTER — Other Ambulatory Visit (INDEPENDENT_AMBULATORY_CARE_PROVIDER_SITE_OTHER): Payer: Self-pay | Admitting: Vascular Surgery

## 2023-08-28 DIAGNOSIS — Z9889 Other specified postprocedural states: Secondary | ICD-10-CM

## 2023-08-30 ENCOUNTER — Encounter (INDEPENDENT_AMBULATORY_CARE_PROVIDER_SITE_OTHER): Payer: Self-pay | Admitting: Nurse Practitioner

## 2023-08-30 ENCOUNTER — Ambulatory Visit (INDEPENDENT_AMBULATORY_CARE_PROVIDER_SITE_OTHER): Payer: Medicare Other

## 2023-08-30 ENCOUNTER — Ambulatory Visit (INDEPENDENT_AMBULATORY_CARE_PROVIDER_SITE_OTHER): Payer: Medicare Other | Admitting: Nurse Practitioner

## 2023-08-30 VITALS — BP 147/90 | HR 73 | Resp 18 | Ht 66.0 in | Wt 186.0 lb

## 2023-08-30 DIAGNOSIS — Z9889 Other specified postprocedural states: Secondary | ICD-10-CM

## 2023-08-30 DIAGNOSIS — I739 Peripheral vascular disease, unspecified: Secondary | ICD-10-CM

## 2023-08-30 DIAGNOSIS — I70221 Atherosclerosis of native arteries of extremities with rest pain, right leg: Secondary | ICD-10-CM

## 2023-08-31 NOTE — Progress Notes (Signed)
 Subjective:    Patient ID: Clayton CHRISTELLA Salt, PhD, male    DOB: 07/06/1946, 78 y.o.   MRN: [id number] Chief Complaint  Patient presents with   Follow-up    3 weeks (08/27/2023); Staple removal with Bilateral lower extremity Ultrasound with ABI's    Today the patient presents for evaluation following right common femoral artery to posterior tibial artery bypass graft.  A reverse great saphenous vein was utilized from his left lower extremity.  Additionally there was a TP trunk and peroneal artery endarterectomy.  The patient has intact incisions with no infection or evidence of dehiscence.  His right lower extremity is warm.  He does have swelling bilaterally and endorses numbness in his thighs as well.  He also has some aching in his right lower extremity.  His pain has been fairly well controlled with Tylenol  and ibuprofen.  Today noninvasive studies show an ABI of 1.07 on the right and 1.16 on the left.  He has triphasic tibial vessel waveforms bilaterally with good toe waveforms bilaterally    Review of Systems  Cardiovascular:  Positive for leg swelling.  Skin:  Positive for wound.  All other systems reviewed and are negative.      Objective:   Physical Exam Vitals reviewed.  HENT:     Head: Normocephalic.  Cardiovascular:     Rate and Rhythm: Normal rate.     Pulses: Normal pulses.  Pulmonary:     Effort: Pulmonary effort is normal.  Musculoskeletal:     Right lower leg: Edema present.     Left lower leg: Edema present.  Skin:    General: Skin is warm and dry.  Neurological:     Mental Status: He is alert and oriented to person, place, and time.  Psychiatric:        Mood and Affect: Mood normal.        Behavior: Behavior normal.        Thought Content: Thought content normal.        Judgment: Judgment normal.     BP (!) 147/90   Pulse 73   Resp 18   Ht 5' 6 (1.676 m)   Wt 186 lb (84.4 kg)   BMI 30.02 kg/m   Past Medical History:  Diagnosis Date   Acute  gastroenteritis    Allergy    Anginal pain (HCC)    Arthritis    Atherosclerosis of native arteries of extremity with intermittent claudication (HCC)    Bilateral high frequency sensorineural hearing loss    BPH (benign prostatic hypertrophy) 12/2005   Bruxism    CAD (coronary artery disease) 2010   dx made by coronary calcium  scoring, Normal stress test in 2010.    Decreased dorsalis pedis pulse    ETD (eustachian tube dysfunction)    Gallstones    GERD (gastroesophageal reflux disease) 06/2004   HLD (hyperlipidemia) 09/1997   Hyperlipidemia, mixed    Incomplete bladder emptying    Left rotator cuff tear 08/12/2011   Per Dr. Josefina 2013 L rotator cuff tear on MRI 2013    Metabolic syndrome X    Peripheral vascular disease (HCC)    Prostatitis, chronic    Right rotator cuff tear 05/24/2013   Unstable angina (HCC)    Vasomotor rhinitis    Wears glasses     Social History   Socioeconomic History   Marital status: Married    Spouse name: Jori   Number of children: 2   Years of education: Not  on file   Highest education level: Doctorate  Occupational History   Not on file  Tobacco Use   Smoking status: Never   Smokeless tobacco: Never  Vaping Use   Vaping status: Never Used  Substance and Sexual Activity   Alcohol use: Yes    Alcohol/week: 4.0 standard drinks of alcohol    Types: 4 Cans of beer per week    Comment: beer, occ   Drug use: No   Sexual activity: Yes  Other Topics Concern   Not on file  Social History Narrative   Married, 2 children - one is local   Married 40+ years   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    Plays guitar   Works out regularly.   Social Drivers of Corporate Investment Banker Strain: Low Risk  (06/12/2023)   Overall Financial Resource Strain (CARDIA)    Difficulty of Paying Living Expenses: Not hard at all  Food Insecurity: No Food Insecurity (08/03/2023)   Hunger Vital Sign    Worried About Running Out of Food in the  Last Year: Never true    Ran Out of Food in the Last Year: Never true  Transportation Needs: No Transportation Needs (08/03/2023)   PRAPARE - Administrator, Civil Service (Medical): No    Lack of Transportation (Non-Medical): No  Physical Activity: Sufficiently Active (06/12/2023)   Exercise Vital Sign    Days of Exercise per Week: 3 days    Minutes of Exercise per Session: 60 min  Stress: No Stress Concern Present (06/12/2023)   [employer] of Occupational Health - Occupational Stress Questionnaire    Feeling of Stress : Not at all  Social Connections: Socially Integrated (08/03/2023)   Social Connection and Isolation Panel [NHANES]    Frequency of Communication with Friends and Family: More than three times a week    Frequency of Social Gatherings with Friends and Family: Three times a week    Attends Religious Services: 1 to 4 times per year    Active Member of Clubs or Organizations: No    Attends Banker Meetings: More than 4 times per year    Marital Status: Married  Catering Manager Violence: Not At Risk (08/03/2023)   Humiliation, Afraid, Rape, and Kick questionnaire    Fear of Current or Ex-Partner: No    Emotionally Abused: No    Physically Abused: No    Sexually Abused: No    Past Surgical History:  Procedure Laterality Date   admitted to [facility] chest pain  12/15/05   Dr. Florencio   BLEPHAROPLASTY  10/11   Dr. Orval    bone spur removed Right    COLONOSCOPY  09/24/04   nml; Dr. Neita    CORONARY ARTERY BYPASS GRAFT N/A 05/29/2020   Procedure: CORONARY ARTERY BYPASS GRAFTING (CABG) TIMES THREE USING LIMA to LAD; ENDOSCOPIC HARVESTED RIGHT GREATER SAPHENOUS VEIN: SVG to PD; SVG to RAMUS.;  Surgeon: Army Dallas NOVAK, MD;  Location: [facility] OR;  Service: Open Heart Surgery;  Laterality: N/A;   EGD esophagitis by bx  01/26/06   gastritis bx neg h-pylori   ENDOVEIN HARVEST OF GREATER SAPHENOUS VEIN Right 05/29/2020   Procedure: ENDOVEIN HARVEST OF  GREATER SAPHENOUS VEIN;  Surgeon: Army Dallas NOVAK, MD;  Location: [facility] OR;  Service: Open Heart Surgery;  Laterality: Right;   FEMORAL-TIBIAL BYPASS GRAFT Right 08/02/2023   Procedure: BYPASS GRAFT FEMORAL-TIBIAL ARTERY;  Surgeon: Jama Cordella MATSU, MD;  Location: [facility] ORS;  Service: Vascular;  Laterality: Right;   LEFT HEART CATH AND CORONARY ANGIOGRAPHY N/A 05/26/2020   Procedure: LEFT HEART CATH AND CORONARY ANGIOGRAPHY;  Surgeon: Hester Wolm PARAS, MD;  Location: [facility] INVASIVE CV LAB;  Service: Cardiovascular;  Laterality: N/A;   LOWER EXTREMITY ANGIOGRAPHY Right 10/01/2020   Procedure: LOWER EXTREMITY ANGIOGRAPHY;  Surgeon: Marea Selinda RAMAN, MD;  Location: [facility] INVASIVE CV LAB;  Service: Cardiovascular;  Laterality: Right;   LOWER EXTREMITY ANGIOGRAPHY Right 08/12/2021   Procedure: LOWER EXTREMITY ANGIOGRAPHY;  Surgeon: Marea Selinda RAMAN, MD;  Location: [facility] INVASIVE CV LAB;  Service: Cardiovascular;  Laterality: Right;   LOWER EXTREMITY ANGIOGRAPHY Right 08/23/2021   Procedure: LOWER EXTREMITY ANGIOGRAPHY;  Surgeon: Marea Selinda RAMAN, MD;  Location: [facility] INVASIVE CV LAB;  Service: Cardiovascular;  Laterality: Right;   LOWER EXTREMITY ANGIOGRAPHY Right 06/06/2023   Procedure: Lower Extremity Angiography;  Surgeon: Jama Cordella MATSU, MD;  Location: [facility] INVASIVE CV LAB;  Service: Cardiovascular;  Laterality: Right;   PARTIAL KNEE ARTHROPLASTY Left 09/06/2022   Procedure: UNICOMPARTMENTAL KNEE;  Surgeon: Josefina Chew, MD;  Location: [facility] ORS;  Service: Orthopedics;  Laterality: Left;   POLYPECTOMY     right and left prostate needle bx  05/02/03   acute inflammation; Dr. Chales    SHOULDER ARTHROSCOPY W/ ROTATOR CUFF REPAIR  12/2011   left   SHOULDER ARTHROSCOPY WITH ROTATOR CUFF REPAIR AND SUBACROMIAL DECOMPRESSION Right 05/24/2013   Procedure: RIGHT SHOULDER ARTHROSCOPY WITH ROTATOR CUFF REPAIR AND SUBACROMIAL DECOMPRESSION AND PARTIAL ACROMIOPLSTY WITH CORACOACROMIAL RELEASE, DISTAL CLAVICULECTOMY, LABRIAL  DEBRIDEMENT;  Surgeon: Chew SHAUNNA Josefina, MD;  Location: [facility];  Service: Orthopedics;  Laterality: Right;   spect ETT nml  12/16/05   TEE WITHOUT CARDIOVERSION N/A 05/29/2020   Procedure: TRANSESOPHAGEAL ECHOCARDIOGRAM (TEE);  Surgeon: Army Dallas NOVAK, MD;  Location: [facility] OR;  Service: Open Heart Surgery;  Laterality: N/A;   UPPER GASTROINTESTINAL ENDOSCOPY     VEIN HARVEST Left 08/02/2023   Procedure: VEIN HARVEST;  Surgeon: Jama Cordella MATSU, MD;  Location: [facility] ORS;  Service: Vascular;  Laterality: Left;   WISDOM TOOTH EXTRACTION      Family History  Problem Relation Age of Onset   Stroke Mother    Glaucoma Other        grandmother at 32   Barrett's esophagus Maternal Uncle    Prostate cancer Neg Hx    Colon cancer Neg Hx    Colon polyps Neg Hx    Esophageal cancer Neg Hx    Rectal cancer Neg Hx    Stomach cancer Neg Hx     Allergies  Allergen Reactions   Ciprofloxacin Hcl Rash   Quinolones Rash       Latest Ref Rng & Units 08/03/2023    9:33 AM 08/02/2023   11:40 PM 08/02/2023    3:41 PM  CBC  WBC 4.0 - 10.5 K/uL 6.6  10.2  14.0   Hemoglobin 13.0 - 17.0 g/dL 9.6  89.1  86.6   Hematocrit 39.0 - 52.0 % 29.4  32.2  40.9   Platelets 150 - 400 K/uL 177  201  219       CMP     Component Value Date/Time   NA 131 (L) 08/03/2023 0933   NA 139 04/21/2023 0829   NA 140 05/12/2012 1240   K 4.0 08/03/2023 0933   K 4.2 05/12/2012 1240   CL 100 08/03/2023 0933   CL 107 05/12/2012 1240   CO2 22 08/03/2023 0933   CO2  26 05/12/2012 1240   GLUCOSE 104 (H) 08/03/2023 0933   GLUCOSE 101 (H) 05/12/2012 1240   BUN 25 (H) 08/03/2023 0933   BUN 20 04/21/2023 0829   BUN 21 (H) 05/12/2012 1240   CREATININE 1.11 08/03/2023 0933   CREATININE 0.86 05/12/2012 1240   CALCIUM  7.9 (L) 08/03/2023 0933   CALCIUM  8.9 05/12/2012 1240   PROT 6.5 06/13/2023 1447   PROT 7.4 01/17/2012 0807   ALBUMIN  4.3 06/13/2023 1447   ALBUMIN  3.9 01/17/2012 0807   AST 24 06/13/2023 1447    AST 57 (H) 01/17/2012 0807   ALT 16 06/13/2023 1447   ALT 49 01/17/2012 0807   ALKPHOS 82 06/13/2023 1447   ALKPHOS 74 01/17/2012 0807   BILITOT <0.2 06/13/2023 1447   BILITOT 0.8 01/17/2012 0807   EGFR 85 04/21/2023 0829   GFRNONAA >60 08/03/2023 0933   GFRNONAA >60 05/12/2012 1240     No results found.     Assessment & Plan:   1. Atherosclerosis of native artery of right lower extremity with rest pain (HCC) (Primary) Overall the patient is doing very well postintervention.  The majority of staples were removed with some left in his left lower extremity.  Will have him return in a week to remove the remaining staples.  He is advised to continue with elevation of his lower extremities to help with the swelling.  I am hopeful he will be able to begin wearing compression in the next week or 2 once his incisions have healed   Current Outpatient Medications on File Prior to Visit  Medication Sig Dispense Refill   acetaminophen  (TYLENOL ) 500 MG tablet Take 1,000 mg by mouth every 6 (six) hours as needed for moderate pain or mild pain.     aspirin  EC 81 MG tablet Take 1 tablet (81 mg total) by mouth daily at 6 (six) AM. Swallow whole. 30 tablet 12   Azelastine -Fluticasone  137-50 MCG/ACT SUSP Place 1 spray into both nostrils daily as needed (Congestion).     CALCIUM  CITRATE PO Take 600 mg by mouth in the morning and at bedtime.     Cholecalciferol  (VITAMIN D ) 2000 UNITS CAPS Take 2,000 Units by mouth daily.     clindamycin (CLEOCIN T) 1 % external solution Apply 1 Application topically 2 (two) times daily.     clopidogrel  (PLAVIX ) 75 MG tablet TAKE 1 TABLET(75 MG) BY MOUTH DAILY 90 tablet 2   Cyanocobalamin  (VITAMIN B-12) 5000 MCG SUBL Place 5,000 mcg under the tongue daily.     dapagliflozin  propanediol (FARXIGA ) 10 MG TABS tablet Take 1 tablet (10 mg total) by mouth daily before breakfast. 90 tablet 3   icosapent  Ethyl (VASCEPA ) 1 g capsule Take 1 capsule (1 g total) by mouth 4 (four)  times daily. 120 capsule 10   Magnesium  Salicylate Tetrahyd (DOANS EXTRA STRENGTH) 580 MG TABS Take 2 tablets by mouth as needed (Pain).     montelukast (SINGULAIR) 10 MG tablet Take 10 mg by mouth daily.     Multiple Vitamin (MULTIVITAMIN) capsule Take 1 capsule by mouth daily.     rosuvastatin  (CRESTOR ) 40 MG tablet Take 1 tablet (40 mg total) by mouth daily. 90 tablet 3   SYMBICORT 80-4.5 MCG/ACT inhaler Inhale 2 puffs into the lungs 2 (two) times daily as needed (shortness of breath).     tadalafil  (CIALIS ) 5 MG tablet Take 5 mg by mouth daily.     oxyCODONE -acetaminophen  (PERCOCET/ROXICET) 5-325 MG tablet Take 1-2 tablets by mouth every 4 (four)  hours as needed for moderate pain (pain score 4-6). 30 tablet 0   No current facility-administered medications on file prior to visit.    There are no Patient Instructions on file for this visit. No follow-ups on file.   Olaoluwa Grieder E Talmadge Ganas, NP

## 2023-09-01 LAB — VAS US ABI WITH/WO TBI
Left ABI: 1.16
Right ABI: 1.07

## 2023-09-07 ENCOUNTER — Encounter (INDEPENDENT_AMBULATORY_CARE_PROVIDER_SITE_OTHER): Payer: Self-pay | Admitting: Nurse Practitioner

## 2023-09-07 ENCOUNTER — Ambulatory Visit (INDEPENDENT_AMBULATORY_CARE_PROVIDER_SITE_OTHER): Payer: Medicare Other | Admitting: Nurse Practitioner

## 2023-09-07 VITALS — BP 114/70 | HR 72 | Resp 18 | Ht 66.0 in | Wt 188.0 lb

## 2023-09-07 DIAGNOSIS — I739 Peripheral vascular disease, unspecified: Secondary | ICD-10-CM

## 2023-09-07 DIAGNOSIS — Z9889 Other specified postprocedural states: Secondary | ICD-10-CM

## 2023-09-07 MED ORDER — GABAPENTIN 100 MG PO CAPS: 100.0000 mg | ORAL_CAPSULE | Freq: Every day | ORAL | 0 refills | Status: DC

## 2023-09-10 NOTE — Progress Notes (Signed)
 Subjective:    Patient ID: Clayton Moris, PhD, male    DOB: [birth year], 78 y.o.   MRN: [id number] Chief Complaint  Patient presents with   Follow-up    F/u 1 week  no studie    The patient returns today for follow-up wound evaluation and staple removal.  He is doing well post surgery.  No dehiscence of the wounds.  No evidence of infection.  He still has swelling bilaterally but this is improved from his previous visit last week.  His largest issue is with some stabbing pain that happens in the shin of his right lower extremity.    Review of Systems  Cardiovascular:  Positive for leg swelling.  All other systems reviewed and are negative.      Objective:   Physical Exam Vitals reviewed.  HENT:     Head: Normocephalic.  Cardiovascular:     Rate and Rhythm: Normal rate.  Pulmonary:     Effort: Pulmonary effort is normal.  Musculoskeletal:     Right lower leg: Edema present.     Left lower leg: Edema present.  Skin:    General: Skin is warm and dry.  Neurological:     Mental Status: He is alert and oriented to person, place, and time.  Psychiatric:        Mood and Affect: Mood normal.        Behavior: Behavior normal.        Thought Content: Thought content normal.        Judgment: Judgment normal.     BP 114/70   Pulse 72   Resp 18   Ht 5\' 6"  (1.676 m)   Wt 188 lb (85.3 kg)   BMI 30.34 kg/m   Past Medical History:  Diagnosis Date   Acute gastroenteritis    Allergy    Anginal pain (HCC)    Arthritis    Atherosclerosis of native arteries of extremity with intermittent claudication (HCC)    Bilateral high frequency sensorineural hearing loss    BPH (benign prostatic hypertrophy) 12/2005   Bruxism    CAD (coronary artery disease) 2010   dx made by coronary calcium scoring, Normal stress test in 2010.    Decreased dorsalis pedis pulse    ETD (eustachian tube dysfunction)    Gallstones    GERD (gastroesophageal reflux disease) 06/2004   HLD  (hyperlipidemia) 09/1997   Hyperlipidemia, mixed    Incomplete bladder emptying    Left rotator cuff tear 08/12/2011   Per Dr. Dion Saucier 2013 L rotator cuff tear on MRI 2013    Metabolic syndrome X    Peripheral vascular disease (HCC)    Prostatitis, chronic    Right rotator cuff tear 05/24/2013   Unstable angina (HCC)    Vasomotor rhinitis    Wears glasses     Social History   Socioeconomic History   Marital status: Married    Spouse name: Junious Dresser   Number of children: 2   Years of education: Not on file   Highest education level: Doctorate  Occupational History   Not on file  Tobacco Use   Smoking status: Never   Smokeless tobacco: Never  Vaping Use   Vaping status: Never Used  Substance and Sexual Activity   Alcohol use: Yes    Alcohol/week: 4.0 standard drinks of alcohol    Types: 4 Cans of beer per week    Comment: beer, occ   Drug use: No   Sexual activity: Yes  Other Topics Concern   Not on file  Social History Narrative   Married, 2 children - one is local   Married 40+ years   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    Plays guitar   Works out regularly.   Social Drivers of Corporate investment banker Strain: Low Risk  (06/12/2023)   Overall Financial Resource Strain (CARDIA)    Difficulty of Paying Living Expenses: Not hard at all  Food Insecurity: No Food Insecurity (08/03/2023)   Hunger Vital Sign    Worried About Running Out of Food in the Last Year: Never true    Ran Out of Food in the Last Year: Never true  Transportation Needs: No Transportation Needs (08/03/2023)   PRAPARE - Administrator, Civil Service (Medical): No    Lack of Transportation (Non-Medical): No  Physical Activity: Sufficiently Active (06/12/2023)   Exercise Vital Sign    Days of Exercise per Week: 3 days    Minutes of Exercise per Session: 60 min  Stress: No Stress Concern Present (06/12/2023)   [employer] of Occupational Health - Occupational Stress  Questionnaire    Feeling of Stress : Not at all  Social Connections: Socially Integrated (08/03/2023)   Social Connection and Isolation Panel [NHANES]    Frequency of Communication with Friends and Family: More than three times a week    Frequency of Social Gatherings with Friends and Family: Three times a week    Attends Religious Services: 1 to 4 times per year    Active Member of Clubs or Organizations: No    Attends Banker Meetings: More than 4 times per year    Marital Status: Married  Catering manager Violence: Not At Risk (08/03/2023)   Humiliation, Afraid, Rape, and Kick questionnaire    Fear of Current or Ex-Partner: No    Emotionally Abused: No    Physically Abused: No    Sexually Abused: No    Past Surgical History:  Procedure Laterality Date   admitted to [facility] chest pain  12/15/05   Dr. Juliann Pares   BLEPHAROPLASTY  10/11   Dr. Kemper Durie    bone spur removed Right    COLONOSCOPY  09/24/04   nml; Dr. Sharen Hint    CORONARY ARTERY BYPASS GRAFT N/A 05/29/2020   Procedure: CORONARY ARTERY BYPASS GRAFTING (CABG) TIMES THREE USING LIMA to LAD; ENDOSCOPIC HARVESTED RIGHT GREATER SAPHENOUS VEIN: SVG to PD; SVG to RAMUS.;  Surgeon: Delight Ovens, MD;  Location: [facility] OR;  Service: Open Heart Surgery;  Laterality: N/A;   EGD esophagitis by bx  01/26/06   gastritis bx neg h-pylori   ENDOVEIN HARVEST OF GREATER SAPHENOUS VEIN Right 05/29/2020   Procedure: ENDOVEIN HARVEST OF GREATER SAPHENOUS VEIN;  Surgeon: Delight Ovens, MD;  Location: [facility] OR;  Service: Open Heart Surgery;  Laterality: Right;   FEMORAL-TIBIAL BYPASS GRAFT Right 08/02/2023   Procedure: BYPASS GRAFT FEMORAL-TIBIAL ARTERY;  Surgeon: Renford Dills, MD;  Location: [facility] ORS;  Service: Vascular;  Laterality: Right;   LEFT HEART CATH AND CORONARY ANGIOGRAPHY N/A 05/26/2020   Procedure: LEFT HEART CATH AND CORONARY ANGIOGRAPHY;  Surgeon: Lamar Blinks, MD;  Location: [facility];  Service: Cardiovascular;   Laterality: N/A;   LOWER EXTREMITY ANGIOGRAPHY Right 10/01/2020   Procedure: LOWER EXTREMITY ANGIOGRAPHY;  Surgeon: Annice Needy, MD;  Location: [facility];  Service: Cardiovascular;  Laterality: Right;   LOWER EXTREMITY ANGIOGRAPHY Right 08/12/2021   Procedure:  LOWER EXTREMITY ANGIOGRAPHY;  Surgeon: Annice Needy, MD;  Location: [facility];  Service: Cardiovascular;  Laterality: Right;   LOWER EXTREMITY ANGIOGRAPHY Right 08/23/2021   Procedure: LOWER EXTREMITY ANGIOGRAPHY;  Surgeon: Annice Needy, MD;  Location: [facility];  Service: Cardiovascular;  Laterality: Right;   LOWER EXTREMITY ANGIOGRAPHY Right 06/06/2023   Procedure: Lower Extremity Angiography;  Surgeon: Renford Dills, MD;  Location: [facility];  Service: Cardiovascular;  Laterality: Right;   PARTIAL KNEE ARTHROPLASTY Left 09/06/2022   Procedure: UNICOMPARTMENTAL KNEE;  Surgeon: Teryl Lucy, MD;  Location: [facility] ORS;  Service: Orthopedics;  Laterality: Left;   POLYPECTOMY     right and left prostate needle bx  05/02/03   acute inflammation; Dr. Patsi Sears    SHOULDER ARTHROSCOPY W/ ROTATOR CUFF REPAIR  12/2011   left   SHOULDER ARTHROSCOPY WITH ROTATOR CUFF REPAIR AND SUBACROMIAL DECOMPRESSION Right 05/24/2013   Procedure: RIGHT SHOULDER ARTHROSCOPY WITH ROTATOR CUFF REPAIR AND SUBACROMIAL DECOMPRESSION AND PARTIAL ACROMIOPLSTY WITH CORACOACROMIAL RELEASE, DISTAL CLAVICULECTOMY, LABRIAL DEBRIDEMENT;  Surgeon: Eulas Post, MD;  Location: [facility];  Service: Orthopedics;  Laterality: Right;   spect ETT nml  12/16/05   TEE WITHOUT CARDIOVERSION N/A 05/29/2020   Procedure: TRANSESOPHAGEAL ECHOCARDIOGRAM (TEE);  Surgeon: Delight Ovens, MD;  Location: [facility] OR;  Service: Open Heart Surgery;  Laterality: N/A;   UPPER GASTROINTESTINAL ENDOSCOPY     VEIN HARVEST Left 08/02/2023   Procedure: VEIN HARVEST;  Surgeon: Renford Dills, MD;  Location: [facility] ORS;  Service: Vascular;   Laterality: Left;   WISDOM TOOTH EXTRACTION      Family History  Problem Relation Age of Onset   Stroke Mother    Glaucoma Other        grandmother at 35   Barrett's esophagus Maternal Uncle    Prostate cancer Neg Hx    Colon cancer Neg Hx    Colon polyps Neg Hx    Esophageal cancer Neg Hx    Rectal cancer Neg Hx    Stomach cancer Neg Hx     Allergies  Allergen Reactions   Ciprofloxacin Hcl Rash   Quinolones Rash       Latest Ref Rng & Units 08/03/2023    9:33 AM 08/02/2023   11:40 PM 08/02/2023    3:41 PM  CBC  WBC 4.0 - 10.5 K/uL 6.6  10.2  14.0   Hemoglobin 13.0 - 17.0 g/dL 9.6  75.6  43.3   Hematocrit 39.0 - 52.0 % 29.4  32.2  40.9   Platelets 150 - 400 K/uL 177  201  219       CMP     Component Value Date/Time   NA 131 (L) 08/03/2023 0933   NA 139 04/21/2023 0829   NA 140 05/12/2012 1240   K 4.0 08/03/2023 0933   K 4.2 05/12/2012 1240   CL 100 08/03/2023 0933   CL 107 05/12/2012 1240   CO2 22 08/03/2023 0933   CO2 26 05/12/2012 1240   GLUCOSE 104 (H) 08/03/2023 0933   GLUCOSE 101 (H) 05/12/2012 1240   BUN 25 (H) 08/03/2023 0933   BUN 20 04/21/2023 0829   BUN 21 (H) 05/12/2012 1240   CREATININE 1.11 08/03/2023 0933   CREATININE 0.86 05/12/2012 1240   CALCIUM 7.9 (L) 08/03/2023 0933   CALCIUM 8.9 05/12/2012 1240   PROT 6.5 06/13/2023 1447   PROT 7.4 01/17/2012 0807   ALBUMIN 4.3 06/13/2023 1447   ALBUMIN  3.9 01/17/2012 0807   AST 24 06/13/2023 1447   AST 57 (H) 01/17/2012 0807   ALT 16 06/13/2023 1447   ALT 49 01/17/2012 0807   ALKPHOS 82 06/13/2023 1447   ALKPHOS 74 01/17/2012 0807   BILITOT <0.2 06/13/2023 1447   BILITOT 0.8 01/17/2012 0807   EGFR 85 04/21/2023 0829   GFRNONAA >60 08/03/2023 0933   GFRNONAA >60 05/12/2012 1240     VAS [place] ABI WITH/WO TBI Result Date: 09/01/2023  LOWER EXTREMITY DOPPLER STUDY Patient Name:  Clayton Powell  Date of Exam:   08/30/2023 Medical Rec #: [id number]         Accession #:    [id number] Date of Birth:  1946/07/19        Patient Gender: M Patient Age:   32 years Exam Location:  [facility] Procedure:      VAS [place] ABI WITH/WO TBI Referring Phys: Levora Dredge --------------------------------------------------------------------------------  Indications: Peripheral artery disease.  Vascular Interventions: 08/23/21: Right SFA/popliteal stent x2;                          08/02/2023: Right CFA to Posterior Tibial Artery BPG                         with Reversed GSV (Harvested from contralateral leg).                         Tibioperoneal Trunk and Peroneal Artery Endart. Comparison Study: 06/01/2023 Performing Technologist: Debbe Bales RVS  Examination Guidelines: A complete evaluation includes at minimum, Doppler waveform signals and systolic blood pressure reading at the level of bilateral brachial, anterior tibial, and posterior tibial arteries, when vessel segments are accessible. Bilateral testing is considered an integral part of a complete examination. Photoelectric Plethysmograph (PPG) waveforms and toe systolic pressure readings are included as required and additional duplex testing as needed. Limited examinations for reoccurring indications may be performed as noted.  ABI Findings: +---------+------------------+-----+---------+--------+ Right    Rt Pressure (mmHg)IndexWaveform Comment  +---------+------------------+-----+---------+--------+ Brachial 156                                      +---------+------------------+-----+---------+--------+ ATA      163               1.04 triphasic         +---------+------------------+-----+---------+--------+ PTA      167               1.07 triphasic         +---------+------------------+-----+---------+--------+ Great Toe130               0.83 Normal            +---------+------------------+-----+---------+--------+ +---------+------------------+-----+---------+-------+ Left     Lt Pressure (mmHg)IndexWaveform Comment  +---------+------------------+-----+---------+-------+ Brachial 154                                     +---------+------------------+-----+---------+-------+ ATA      164               1.05 triphasic        +---------+------------------+-----+---------+-------+ PTA      181  1.16 triphasic        +---------+------------------+-----+---------+-------+ Great Toe156               1.00 Normal           +---------+------------------+-----+---------+-------+ +-------+-----------+-----------+------------+------------+ ABI/TBIToday's ABIToday's TBIPrevious ABIPrevious TBI +-------+-----------+-----------+------------+------------+ Right  1.07       .83        .80         .43          +-------+-----------+-----------+------------+------------+ Left   1.16       1.0        1.26        1.04         +-------+-----------+-----------+------------+------------+ Right ABIs and TBIs appear increased compared to prior study on 06/01/2023. Left ABIs and TBIs appear essentially unchanged compared to prior study on 06/01/2023.  Summary: Right: Resting right ankle-brachial index is within normal range. The right toe-brachial index is normal. Left: Resting left ankle-brachial index is within normal range. The left toe-brachial index is normal. *See table(s) above for measurements and observations.  Electronically signed by Levora Dredge MD on 09/01/2023 at 7:24:25 AM.    Final        Assessment & Plan:   1. Peripheral arterial disease with history of revascularization (HCC) (Primary) Remaining staples removed.  The patient is doing very well.  The only issue is mild neuropathic pain.  Will start on low-dose gabapentin to see if this is helpful for him.  Otherwise we will have the patient return in 3 weeks in order to reevaluate progress with wound healing.   Current Outpatient Medications on File Prior to Visit  Medication Sig Dispense Refill   acetaminophen (TYLENOL) 500  MG tablet Take 1,000 mg by mouth every 6 (six) hours as needed for moderate pain or mild pain.     aspirin EC 81 MG tablet Take 1 tablet (81 mg total) by mouth daily at 6 (six) AM. Swallow whole. 30 tablet 12   Azelastine-Fluticasone 137-50 MCG/ACT SUSP Place 1 spray into both nostrils daily as needed (Congestion).     CALCIUM CITRATE PO Take 600 mg by mouth in the morning and at bedtime.     Cholecalciferol (VITAMIN D) 2000 UNITS CAPS Take 2,000 Units by mouth daily.     clindamycin (CLEOCIN T) 1 % external solution Apply 1 Application topically 2 (two) times daily.     clopidogrel (PLAVIX) 75 MG tablet TAKE 1 TABLET(75 MG) BY MOUTH DAILY 90 tablet 2   Cyanocobalamin (VITAMIN B-12) 5000 MCG SUBL Place 5,000 mcg under the tongue daily.     dapagliflozin propanediol (FARXIGA) 10 MG TABS tablet Take 1 tablet (10 mg total) by mouth daily before breakfast. 90 tablet 3   icosapent Ethyl (VASCEPA) 1 g capsule Take 1 capsule (1 g total) by mouth 4 (four) times daily. 120 capsule 10   Magnesium Salicylate Tetrahyd (DOANS EXTRA STRENGTH) 580 MG TABS Take 2 tablets by mouth as needed (Pain).     montelukast (SINGULAIR) 10 MG tablet Take 10 mg by mouth daily.     Multiple Vitamin (MULTIVITAMIN) capsule Take 1 capsule by mouth daily.     rosuvastatin (CRESTOR) 40 MG tablet Take 1 tablet (40 mg total) by mouth daily. 90 tablet 3   SYMBICORT 80-4.5 MCG/ACT inhaler Inhale 2 puffs into the lungs 2 (two) times daily as needed (shortness of breath).     tadalafil (CIALIS) 5 MG tablet Take 5 mg by mouth daily.     oxyCODONE-acetaminophen (  PERCOCET/ROXICET) 5-325 MG tablet Take 1-2 tablets by mouth every 4 (four) hours as needed for moderate pain (pain score 4-6). 30 tablet 0   No current facility-administered medications on file prior to visit.    There are no Patient Instructions on file for this visit. No follow-ups on file.   Georgiana Spinner, NP

## 2023-09-24 NOTE — Progress Notes (Unsigned)
 Patient ID: Clayton Moris, PhD, male   DOB: 04-19-1946, 78 y.o.   MRN: [id number]  No chief complaint on file.   HPI Clayton Moris, PhD is a 78 y.o. male.    The patient returns for follow-up.  He is status post right femoral to posterior tibial bypass with reversed great saphenous vein.  Surgery was performed August 02, 2023  He denies pain.  He denies any issues with his incisions.  He does have some numbness in the medial left thigh associated with the most proximal incision.  He also has some significant edema of the right lower extremity in the shin area   Past Medical History:  Diagnosis Date   Acute gastroenteritis    Allergy    Anginal pain (HCC)    Arthritis    Atherosclerosis of native arteries of extremity with intermittent claudication (HCC)    Bilateral high frequency sensorineural hearing loss    BPH (benign prostatic hypertrophy) 12/2005   Bruxism    CAD (coronary artery disease) 2010   dx made by coronary calcium scoring, Normal stress test in 2010.    Decreased dorsalis pedis pulse    ETD (eustachian tube dysfunction)    Gallstones    GERD (gastroesophageal reflux disease) 06/2004   HLD (hyperlipidemia) 09/1997   Hyperlipidemia, mixed    Incomplete bladder emptying    Left rotator cuff tear 08/12/2011   Per Dr. Dion Saucier 2013 L rotator cuff tear on MRI 2013    Metabolic syndrome X    Peripheral vascular disease (HCC)    Prostatitis, chronic    Right rotator cuff tear 05/24/2013   Unstable angina (HCC)    Vasomotor rhinitis    Wears glasses     Past Surgical History:  Procedure Laterality Date   admitted to [facility] chest pain  12/15/05   Dr. Juliann Pares   BLEPHAROPLASTY  10/11   Dr. Kemper Durie    bone spur removed Right    COLONOSCOPY  09/24/04   nml; Dr. Sharen Hint    CORONARY ARTERY BYPASS GRAFT N/A 05/29/2020   Procedure: CORONARY ARTERY BYPASS GRAFTING (CABG) TIMES THREE USING LIMA to LAD; ENDOSCOPIC HARVESTED RIGHT GREATER SAPHENOUS VEIN: SVG to PD; SVG  to RAMUS.;  Surgeon: Delight Ovens, MD;  Location: [facility] OR;  Service: Open Heart Surgery;  Laterality: N/A;   EGD esophagitis by bx  01/26/06   gastritis bx neg h-pylori   ENDOVEIN HARVEST OF GREATER SAPHENOUS VEIN Right 05/29/2020   Procedure: ENDOVEIN HARVEST OF GREATER SAPHENOUS VEIN;  Surgeon: Delight Ovens, MD;  Location: [facility] OR;  Service: Open Heart Surgery;  Laterality: Right;   FEMORAL-TIBIAL BYPASS GRAFT Right 08/02/2023   Procedure: BYPASS GRAFT FEMORAL-TIBIAL ARTERY;  Surgeon: Renford Dills, MD;  Location: [facility] ORS;  Service: Vascular;  Laterality: Right;   LEFT HEART CATH AND CORONARY ANGIOGRAPHY N/A 05/26/2020   Procedure: LEFT HEART CATH AND CORONARY ANGIOGRAPHY;  Surgeon: Lamar Blinks, MD;  Location: [facility];  Service: Cardiovascular;  Laterality: N/A;   LOWER EXTREMITY ANGIOGRAPHY Right 10/01/2020   Procedure: LOWER EXTREMITY ANGIOGRAPHY;  Surgeon: Annice Needy, MD;  Location: [facility];  Service: Cardiovascular;  Laterality: Right;   LOWER EXTREMITY ANGIOGRAPHY Right 08/12/2021   Procedure: LOWER EXTREMITY ANGIOGRAPHY;  Surgeon: Annice Needy, MD;  Location: [facility];  Service: Cardiovascular;  Laterality: Right;   LOWER EXTREMITY ANGIOGRAPHY Right 08/23/2021   Procedure: LOWER EXTREMITY ANGIOGRAPHY;  Surgeon: Annice Needy, MD;  Location: [facility];  Service: Cardiovascular;  Laterality: Right;   LOWER EXTREMITY ANGIOGRAPHY Right 06/06/2023   Procedure: Lower Extremity Angiography;  Surgeon: Renford Dills, MD;  Location: [facility];  Service: Cardiovascular;  Laterality: Right;   PARTIAL KNEE ARTHROPLASTY Left 09/06/2022   Procedure: UNICOMPARTMENTAL KNEE;  Surgeon: Teryl Lucy, MD;  Location: [facility];  Service: Orthopedics;  Laterality: Left;   POLYPECTOMY     right and left prostate needle bx  05/02/03   acute inflammation; Dr. Patsi Sears    SHOULDER ARTHROSCOPY W/ ROTATOR CUFF REPAIR  12/2011   left    SHOULDER ARTHROSCOPY WITH ROTATOR CUFF REPAIR AND SUBACROMIAL DECOMPRESSION Right 05/24/2013   Procedure: RIGHT SHOULDER ARTHROSCOPY WITH ROTATOR CUFF REPAIR AND SUBACROMIAL DECOMPRESSION AND PARTIAL ACROMIOPLSTY WITH CORACOACROMIAL RELEASE, DISTAL CLAVICULECTOMY, LABRIAL DEBRIDEMENT;  Surgeon: Eulas Post, MD;  Location: [facility];  Service: Orthopedics;  Laterality: Right;   spect ETT nml  12/16/05   TEE WITHOUT CARDIOVERSION N/A 05/29/2020   Procedure: TRANSESOPHAGEAL ECHOCARDIOGRAM (TEE);  Surgeon: Delight Ovens, MD;  Location: [facility] OR;  Service: Open Heart Surgery;  Laterality: N/A;   UPPER GASTROINTESTINAL ENDOSCOPY     VEIN HARVEST Left 08/02/2023   Procedure: VEIN HARVEST;  Surgeon: Renford Dills, MD;  Location: [facility];  Service: Vascular;  Laterality: Left;   WISDOM TOOTH EXTRACTION        Allergies  Allergen Reactions   Ciprofloxacin Hcl Rash   Quinolones Rash    Current Outpatient Medications  Medication Sig Dispense Refill   acetaminophen (TYLENOL) 500 MG tablet Take 1,000 mg by mouth every 6 (six) hours as needed for moderate pain or mild pain.     aspirin EC 81 MG tablet Take 1 tablet (81 mg total) by mouth daily at 6 (six) AM. Swallow whole. 30 tablet 12   Azelastine-Fluticasone 137-50 MCG/ACT SUSP Place 1 spray into both nostrils daily as needed (Congestion).     CALCIUM CITRATE PO Take 600 mg by mouth in the morning and at bedtime.     Cholecalciferol (VITAMIN D) 2000 UNITS CAPS Take 2,000 Units by mouth daily.     clindamycin (CLEOCIN T) 1 % external solution Apply 1 Application topically 2 (two) times daily.     clopidogrel (PLAVIX) 75 MG tablet TAKE 1 TABLET(75 MG) BY MOUTH DAILY 90 tablet 2   Cyanocobalamin (VITAMIN B-12) 5000 MCG SUBL Place 5,000 mcg under the tongue daily.     dapagliflozin propanediol (FARXIGA) 10 MG TABS tablet Take 1 tablet (10 mg total) by mouth daily before breakfast. 90 tablet 3   gabapentin (NEURONTIN) 100 MG  capsule Take 1 capsule (100 mg total) by mouth at bedtime. 30 capsule 0   icosapent Ethyl (VASCEPA) 1 g capsule Take 1 capsule (1 g total) by mouth 4 (four) times daily. 120 capsule 10   Magnesium Salicylate Tetrahyd (DOANS EXTRA STRENGTH) 580 MG TABS Take 2 tablets by mouth as needed (Pain).     montelukast (SINGULAIR) 10 MG tablet Take 10 mg by mouth daily.     Multiple Vitamin (MULTIVITAMIN) capsule Take 1 capsule by mouth daily.     oxyCODONE-acetaminophen (PERCOCET/ROXICET) 5-325 MG tablet Take 1-2 tablets by mouth every 4 (four) hours as needed for moderate pain (pain score 4-6). 30 tablet 0   rosuvastatin (CRESTOR) 40 MG tablet Take 1 tablet (40 mg total) by mouth daily. 90 tablet 3   SYMBICORT 80-4.5 MCG/ACT inhaler Inhale 2 puffs into the lungs 2 (two)  times daily as needed (shortness of breath).     tadalafil (CIALIS) 5 MG tablet Take 5 mg by mouth daily.     No current facility-administered medications for this visit.        Physical Exam There were no vitals taken for this visit. Gen:  WD/WN, NAD Skin: incisions C/D/I; there is a strong easily 2+ palpable right posterior tibial pulse.  There is 2-3+ hard woody edema of the shin and ankle on the right     Assessment/Plan: 1. Atherosclerosis of artery of extremity with rest pain (HCC) (Primary) Patient is status post femoral posterior tibial bypass using saphenous vein harvested from the contralateral leg.  He has an excellent pulse.  His incisions have now healed and his baseline pain symptoms have completely resolved.  He is doing quite well.  He will continue exercise.  I have encouraged him to begin using a moderate graduated compression sock.  Continue elevation.  He will follow-up with me in 3 months with a duplex ultrasound as well as an ABI. - VAS [place] LOWER EXTREMITY ARTERIAL DUPLEX; Future - VAS [place] ABI WITH/WO TBI; Future      Levora Dredge 09/24/2023, 1:45 PM   This note was created with Dragon medical  transcription system.  Any errors from dictation are unintentional.

## 2023-09-25 ENCOUNTER — Ambulatory Visit (INDEPENDENT_AMBULATORY_CARE_PROVIDER_SITE_OTHER): Payer: Medicare Other | Admitting: Vascular Surgery

## 2023-09-25 ENCOUNTER — Encounter (INDEPENDENT_AMBULATORY_CARE_PROVIDER_SITE_OTHER): Payer: Self-pay | Admitting: Vascular Surgery

## 2023-09-25 VITALS — BP 136/84 | HR 59 | Resp 18 | Ht 66.0 in | Wt 190.0 lb

## 2023-09-25 DIAGNOSIS — I70229 Atherosclerosis of native arteries of extremities with rest pain, unspecified extremity: Secondary | ICD-10-CM

## 2023-11-27 ENCOUNTER — Ambulatory Visit (HOSPITAL_COMMUNITY): Payer: Medicare Other | Attending: Internal Medicine

## 2023-11-27 DIAGNOSIS — I2510 Atherosclerotic heart disease of native coronary artery without angina pectoris: Secondary | ICD-10-CM | POA: Diagnosis not present

## 2023-11-27 LAB — ECHOCARDIOGRAM COMPLETE
Area-P 1/2: 2.69 cm2
S' Lateral: 2.3 cm

## 2023-12-05 ENCOUNTER — Ambulatory Visit (HOSPITAL_BASED_OUTPATIENT_CLINIC_OR_DEPARTMENT_OTHER): Payer: Self-pay | Admitting: Internal Medicine

## 2023-12-12 ENCOUNTER — Encounter (INDEPENDENT_AMBULATORY_CARE_PROVIDER_SITE_OTHER): Payer: Self-pay

## 2023-12-23 NOTE — Progress Notes (Unsigned)
 MRN : [id number]  Clayton JASINSKI, PhD is a 78 y.o. (1945-10-28) male who presents with chief complaint of check circulation.  History of Present Illness:  The patient returns to the office for followup and review status post The patient returns for follow-up.  He is status post right femoral to posterior tibial bypass with reversed great saphenous vein.  Surgery was performed August 02, 2023   The patient notes improvement in the lower extremity symptoms. No interval shortening of the patient's claudication distance or rest pain symptoms. No new ulcers or wounds have occurred since the last visit.  There have been no significant changes to the patient's overall health care.  No documented history of amaurosis fugax or recent TIA symptoms. There are no recent neurological changes noted. No documented history of DVT, PE or superficial thrombophlebitis. The patient denies recent episodes of angina or shortness of breath.   ABI's Rt=*** and Lt=***  (previous ABI's Rt=*** and Lt=***) Duplex US  of the *** lower extremity arterial system shows ***   No outpatient medications have been marked as taking for the 12/25/23 encounter (Appointment) with Prescilla Brod, Ninette Basque, MD.    Past Medical History:  Diagnosis Date   Acute gastroenteritis    Allergy    Anginal pain (HCC)    Arthritis    Atherosclerosis of native arteries of extremity with intermittent claudication (HCC)    Bilateral high frequency sensorineural hearing loss    BPH (benign prostatic hypertrophy) 12/2005   Bruxism    CAD (coronary artery disease) 2010   dx made by coronary calcium  scoring, Normal stress test in 2010.    Decreased dorsalis pedis pulse    ETD (eustachian tube dysfunction)    Gallstones    GERD (gastroesophageal reflux disease) 06/2004   HLD (hyperlipidemia) 09/1997   Hyperlipidemia, mixed    Incomplete bladder emptying    Left rotator  cuff tear 08/12/2011   Per Dr. Agatha Horsfall 2013 L rotator cuff tear on MRI 2013    Metabolic syndrome X    Peripheral vascular disease (HCC)    Prostatitis, chronic    Right rotator cuff tear 05/24/2013   Unstable angina (HCC)    Vasomotor rhinitis    Wears glasses     Past Surgical History:  Procedure Laterality Date   admitted to [facility] chest pain  12/15/05   Dr. Beau Bound   BLEPHAROPLASTY  10/11   Dr. Adron Horns    bone spur removed Right    COLONOSCOPY  09/24/04   nml; Dr. Amalia Jung    CORONARY ARTERY BYPASS GRAFT N/A 05/29/2020   Procedure: CORONARY ARTERY BYPASS GRAFTING (CABG) TIMES THREE USING LIMA to LAD; ENDOSCOPIC HARVESTED RIGHT GREATER SAPHENOUS VEIN: SVG to PD; SVG to RAMUS.;  Surgeon: Norita Beauvais, MD;  Location: [facility] OR;  Service: Open Heart Surgery;  Laterality: N/A;   EGD esophagitis by bx  01/26/06   gastritis bx neg h-pylori   ENDOVEIN HARVEST OF GREATER SAPHENOUS VEIN Right 05/29/2020   Procedure: ENDOVEIN HARVEST OF GREATER SAPHENOUS VEIN;  Surgeon: Norita Beauvais, MD;  Location: [facility] OR;  Service: Open  Heart Surgery;  Laterality: Right;   FEMORAL-TIBIAL BYPASS GRAFT Right 08/02/2023   Procedure: BYPASS GRAFT FEMORAL-TIBIAL ARTERY;  Surgeon: Jackquelyn Mass, MD;  Location: [facility] ORS;  Service: Vascular;  Laterality: Right;   LEFT HEART CATH AND CORONARY ANGIOGRAPHY N/A 05/26/2020   Procedure: LEFT HEART CATH AND CORONARY ANGIOGRAPHY;  Surgeon: Michelle Aid, MD;  Location: [facility] INVASIVE CV LAB;  Service: Cardiovascular;  Laterality: N/A;   LOWER EXTREMITY ANGIOGRAPHY Right 10/01/2020   Procedure: LOWER EXTREMITY ANGIOGRAPHY;  Surgeon: Celso College, MD;  Location: [facility] INVASIVE CV LAB;  Service: Cardiovascular;  Laterality: Right;   LOWER EXTREMITY ANGIOGRAPHY Right 08/12/2021   Procedure: LOWER EXTREMITY ANGIOGRAPHY;  Surgeon: Celso College, MD;  Location: [facility] INVASIVE CV LAB;  Service: Cardiovascular;  Laterality: Right;   LOWER EXTREMITY ANGIOGRAPHY Right 08/23/2021    Procedure: LOWER EXTREMITY ANGIOGRAPHY;  Surgeon: Celso College, MD;  Location: [facility] INVASIVE CV LAB;  Service: Cardiovascular;  Laterality: Right;   LOWER EXTREMITY ANGIOGRAPHY Right 06/06/2023   Procedure: Lower Extremity Angiography;  Surgeon: Jackquelyn Mass, MD;  Location: [facility] INVASIVE CV LAB;  Service: Cardiovascular;  Laterality: Right;   PARTIAL KNEE ARTHROPLASTY Left 09/06/2022   Procedure: UNICOMPARTMENTAL KNEE;  Surgeon: Osa Blase, MD;  Location: [facility] ORS;  Service: Orthopedics;  Laterality: Left;   POLYPECTOMY     right and left prostate needle bx  05/02/03   acute inflammation; Dr. Isla Mari    SHOULDER ARTHROSCOPY W/ ROTATOR CUFF REPAIR  12/2011   left   SHOULDER ARTHROSCOPY WITH ROTATOR CUFF REPAIR AND SUBACROMIAL DECOMPRESSION Right 05/24/2013   Procedure: RIGHT SHOULDER ARTHROSCOPY WITH ROTATOR CUFF REPAIR AND SUBACROMIAL DECOMPRESSION AND PARTIAL ACROMIOPLSTY WITH CORACOACROMIAL RELEASE, DISTAL CLAVICULECTOMY, LABRIAL DEBRIDEMENT;  Surgeon: Neville Barbone, MD;  Location: [facility];  Service: Orthopedics;  Laterality: Right;   spect ETT nml  12/16/05   TEE WITHOUT CARDIOVERSION N/A 05/29/2020   Procedure: TRANSESOPHAGEAL ECHOCARDIOGRAM (TEE);  Surgeon: Norita Beauvais, MD;  Location: [facility] OR;  Service: Open Heart Surgery;  Laterality: N/A;   UPPER GASTROINTESTINAL ENDOSCOPY     VEIN HARVEST Left 08/02/2023   Procedure: VEIN HARVEST;  Surgeon: Jackquelyn Mass, MD;  Location: [facility] ORS;  Service: Vascular;  Laterality: Left;   WISDOM TOOTH EXTRACTION      Social History Social History   Tobacco Use   Smoking status: Never   Smokeless tobacco: Never  Vaping Use   Vaping status: Never Used  Substance Use Topics   Alcohol use: Yes    Alcohol/week: 4.0 standard drinks of alcohol    Types: 4 Cans of beer per week    Comment: beer, occ   Drug use: No    Family History Family History  Problem Relation Age of Onset   Stroke Mother    Glaucoma Other         grandmother at 46   Barrett's esophagus Maternal Uncle    Prostate cancer Neg Hx    Colon cancer Neg Hx    Colon polyps Neg Hx    Esophageal cancer Neg Hx    Rectal cancer Neg Hx    Stomach cancer Neg Hx     Allergies  Allergen Reactions   Ciprofloxacin Hcl Rash   Quinolones Rash     REVIEW OF SYSTEMS (Negative unless checked)  Constitutional: [] Weight loss  [] Fever  [] Chills Cardiac: [] Chest pain   [] Chest pressure   [] Palpitations   [] Shortness of breath when laying flat   [] Shortness of breath  with exertion. Vascular:  [x] Pain in legs with walking   [] Pain in legs at rest  [] History of DVT   [] Phlebitis   [] Swelling in legs   [] Varicose veins   [] Non-healing ulcers Pulmonary:   [] Uses home oxygen   [] Productive cough   [] Hemoptysis   [] Wheeze  [] COPD   [] Asthma Neurologic:  [] Dizziness   [] Seizures   [] History of stroke   [] History of TIA  [] Aphasia   [] Vissual changes   [] Weakness or numbness in arm   [] Weakness or numbness in leg Musculoskeletal:   [] Joint swelling   [] Joint pain   [] Low back pain Hematologic:  [] Easy bruising  [] Easy bleeding   [] Hypercoagulable state   [] Anemic Gastrointestinal:  [] Diarrhea   [] Vomiting  [x] Gastroesophageal reflux/heartburn   [] Difficulty swallowing. Genitourinary:  [] Chronic kidney disease   [] Difficult urination  [] Frequent urination   [] Blood in urine Skin:  [] Rashes   [] Ulcers  Psychological:  [] History of anxiety   []  History of major depression.  Physical Examination  There were no vitals filed for this visit. There is no height or weight on file to calculate BMI. Gen: WD/WN, NAD Head: [place]/AT, No temporalis wasting.  Ear/Nose/Throat: Hearing grossly intact, nares w/o erythema or drainage Eyes: PER, EOMI, sclera nonicteric.  Neck: Supple, no masses.  No bruit or JVD.  Pulmonary:  Good air movement, no audible wheezing, no use of accessory muscles.  Cardiac: RRR, normal S1, S2, no Murmurs. Vascular:  mild trophic changes, no  open wounds Vessel Right Left  Radial Palpable Palpable  PT Not Palpable Not Palpable  DP Not Palpable Not Palpable  Gastrointestinal: soft, non-distended. No guarding/no peritoneal signs.  Musculoskeletal: M/S 5/5 throughout.  No visible deformity.  Neurologic: CN 2-12 intact. Pain and light touch intact in extremities.  Symmetrical.  Speech is fluent. Motor exam as listed above. Psychiatric: Judgment intact, Mood & affect appropriate for pt's clinical situation. Dermatologic: No rashes or ulcers noted.  No changes consistent with cellulitis.   CBC Lab Results  Component Value Date   WBC 6.6 08/03/2023   HGB 9.6 (L) 08/03/2023   HCT 29.4 (L) 08/03/2023   MCV 89.9 08/03/2023   PLT 177 08/03/2023    BMET    Component Value Date/Time   NA 131 (L) 08/03/2023 0933   NA 139 04/21/2023 0829   NA 140 05/12/2012 1240   K 4.0 08/03/2023 0933   K 4.2 05/12/2012 1240   CL 100 08/03/2023 0933   CL 107 05/12/2012 1240   CO2 22 08/03/2023 0933   CO2 26 05/12/2012 1240   GLUCOSE 104 (H) 08/03/2023 0933   GLUCOSE 101 (H) 05/12/2012 1240   BUN 25 (H) 08/03/2023 0933   BUN 20 04/21/2023 0829   BUN 21 (H) 05/12/2012 1240   CREATININE 1.11 08/03/2023 0933   CREATININE 0.86 05/12/2012 1240   CALCIUM  7.9 (L) 08/03/2023 0933   CALCIUM  8.9 05/12/2012 1240   GFRNONAA >60 08/03/2023 0933   GFRNONAA >60 05/12/2012 1240   GFRAA >60 05/12/2012 1240   CrCl cannot be calculated (Patient's most recent lab result is older than the maximum 21 days allowed.).  COAG Lab Results  Component Value Date   INR 1.0 05/13/2023   INR 1.4 (H) 05/29/2020   INR 1.1 05/28/2020    Radiology ECHOCARDIOGRAM COMPLETE Result Date: 11/27/2023    ECHOCARDIOGRAM REPORT   Patient Name:   ZAIDIN BLYDEN Kobler Date of Exam: 11/27/2023 Medical Rec #:  [id number]        Height:  66.0 in Accession #:    [id number]       Weight:       190.0 lb Date of Birth:  [birth year]       BSA:          1.957 m Patient Age:    77 years          BP:           136/84 mmHg Patient Gender: M                HR:           68 bpm. Exam Location:  [address] Procedure: 2D Echo, 3D Echo, Cardiac Doppler, Color Doppler and Strain Analysis            (Both Spectral and Color Flow Doppler were utilized during            procedure). Indications:    I25.10 Coronary artery Disease  History:        Patient has prior history of Echocardiogram examinations, most                 recent 05/29/2020. CAD; Risk Factors:Dyslipidemia.  Sonographer:    Cheri Coria Rodgers-Jones RDCS Referring Phys: [id number] KENNETH C HILTY IMPRESSIONS  1. The left ventricle has normal function. The left ventricle has no regional wall motion abnormalities. There is mild concentric left ventricular hypertrophy. Left ventricular diastolic parameters are consistent with Grade I diastolic dysfunction (impaired relaxation). The average left ventricular global longitudinal strain is -22.8 %. The global longitudinal strain is normal.  2. Right ventricular systolic function is normal. The right ventricular size is normal. There is normal pulmonary artery systolic pressure.  3. The mitral valve is normal in structure. Trivial mitral valve regurgitation. No evidence of mitral stenosis.  4. The aortic valve is tricuspid. There is mild calcification of the aortic valve. There is mild thickening of the aortic valve. Aortic valve regurgitation is not visualized. Aortic valve sclerosis/calcification is present, without any evidence of aortic stenosis.  5. The inferior vena cava is normal in size with greater than 50% respiratory variability, suggesting right atrial pressure of 3 mmHg. FINDINGS  Left Ventricle: The left ventricle has normal function. The left ventricle has no regional wall motion abnormalities. The average left ventricular global longitudinal strain is -22.8 %. Strain was performed and the global longitudinal strain is normal. The left ventricular internal cavity size was normal in size. There  is mild concentric left ventricular hypertrophy. Left ventricular diastolic parameters are consistent with Grade I diastolic dysfunction (impaired relaxation). Right Ventricle: The right ventricular size is normal. No increase in right ventricular wall thickness. Right ventricular systolic function is normal. There is normal pulmonary artery systolic pressure. The tricuspid regurgitant velocity is 2.33 m/s, and  with an assumed right atrial pressure of 3 mmHg, the estimated right ventricular systolic pressure is 24.7 mmHg. Left Atrium: Left atrial size was normal in size. Right Atrium: Right atrial size was normal in size. Pericardium: There is no evidence of pericardial effusion. Mitral Valve: The mitral valve is normal in structure. Trivial mitral valve regurgitation. No evidence of mitral valve stenosis. Tricuspid Valve: The tricuspid valve is normal in structure. Tricuspid valve regurgitation is mild . No evidence of tricuspid stenosis. Aortic Valve: The aortic valve is tricuspid. There is mild calcification of the aortic valve. There is mild thickening of the aortic valve. Aortic valve regurgitation is not visualized. Aortic valve sclerosis/calcification is present, without any evidence of aortic  stenosis. Pulmonic Valve: The pulmonic valve was normal in structure. Pulmonic valve regurgitation is not visualized. No evidence of pulmonic stenosis. Aorta: The aortic root is normal in size and structure. Venous: The inferior vena cava is normal in size with greater than 50% respiratory variability, suggesting right atrial pressure of 3 mmHg. IAS/Shunts: No atrial level shunt detected by color flow Doppler. Additional Comments: 3D was performed not requiring image post processing on an independent workstation and was normal.  LEFT VENTRICLE PLAX 2D LVIDd:         3.30 cm   Diastology LVIDs:         2.30 cm   LV e' medial:    6.35 cm/s LV PW:         1.20 cm   LV E/e' medial:  12.1 LV IVS:        1.20 cm   LV e'  lateral:   12.00 cm/s LVOT diam:     2.10 cm   LV E/e' lateral: 6.4 LV SV:         88 LV SV Index:   45        2D Longitudinal Strain LVOT Area:     3.46 cm  2D Strain GLS (A4C):   -23.5 %                          2D Strain GLS (A3C):   -22.2 %                          2D Strain GLS (A2C):   -22.7 %                          2D Strain GLS Avg:     -22.8 %                           3D Volume EF:                          3D EF:        57 %                          LV EDV:       135 ml                          LV ESV:       58 ml                          LV SV:        77 ml RIGHT VENTRICLE             IVC RV Basal diam:  3.90 cm     IVC diam: 1.90 cm RV S prime:     17.30 cm/s TAPSE (M-mode): 3.0 cm LEFT ATRIUM             Index        RIGHT ATRIUM           Index LA diam:        4.70 cm 2.40 cm/m   RA Area:     14.00 cm LA Vol (A2C):   46.9 ml 23.97 ml/m  RA Volume:   37.80 ml  19.32 ml/m LA Vol (A4C):   47.9 ml 24.48 ml/m LA Biplane Vol: 47.3 ml 24.17 ml/m  AORTIC VALVE LVOT Vmax:   112.00 cm/s LVOT Vmean:  73.900 cm/s LVOT VTI:    0.255 m  AORTA Ao Root diam: 3.50 cm Ao Asc diam:  3.60 cm MITRAL VALVE                TRICUSPID VALVE MV Area (PHT): 2.69 cm     TR Peak grad:   21.7 mmHg MV Decel Time: 282 msec     TR Vmax:        233.00 cm/s MV E velocity: 76.80 cm/s MV A velocity: 104.50 cm/s  SHUNTS MV E/A ratio:  0.73         Systemic VTI:  0.26 m                             Systemic Diam: 2.10 cm Maudine Sos MD Electronically signed by Maudine Sos MD Signature Date/Time: 11/27/2023/6:08:36 PM    Final      Assessment/Plan There are no diagnoses linked to this encounter.   Devon Fogo, MD  12/23/2023 3:43 PM

## 2023-12-25 ENCOUNTER — Ambulatory Visit (INDEPENDENT_AMBULATORY_CARE_PROVIDER_SITE_OTHER)

## 2023-12-25 ENCOUNTER — Ambulatory Visit (INDEPENDENT_AMBULATORY_CARE_PROVIDER_SITE_OTHER): Payer: PRIVATE HEALTH INSURANCE

## 2023-12-25 ENCOUNTER — Ambulatory Visit (INDEPENDENT_AMBULATORY_CARE_PROVIDER_SITE_OTHER): Payer: PRIVATE HEALTH INSURANCE | Admitting: Vascular Surgery

## 2023-12-25 ENCOUNTER — Encounter (INDEPENDENT_AMBULATORY_CARE_PROVIDER_SITE_OTHER): Payer: Self-pay | Admitting: Vascular Surgery

## 2023-12-25 VITALS — BP 136/83 | HR 62 | Resp 18 | Ht 66.0 in | Wt 186.0 lb

## 2023-12-25 DIAGNOSIS — K219 Gastro-esophageal reflux disease without esophagitis: Secondary | ICD-10-CM | POA: Diagnosis not present

## 2023-12-25 DIAGNOSIS — I70229 Atherosclerosis of native arteries of extremities with rest pain, unspecified extremity: Secondary | ICD-10-CM | POA: Diagnosis not present

## 2023-12-25 DIAGNOSIS — I70211 Atherosclerosis of native arteries of extremities with intermittent claudication, right leg: Secondary | ICD-10-CM | POA: Diagnosis not present

## 2023-12-25 DIAGNOSIS — E782 Mixed hyperlipidemia: Secondary | ICD-10-CM

## 2023-12-25 DIAGNOSIS — I2510 Atherosclerotic heart disease of native coronary artery without angina pectoris: Secondary | ICD-10-CM | POA: Diagnosis not present

## 2023-12-26 ENCOUNTER — Encounter (INDEPENDENT_AMBULATORY_CARE_PROVIDER_SITE_OTHER): Payer: Self-pay | Admitting: Vascular Surgery

## 2023-12-26 LAB — VAS US ABI WITH/WO TBI
Left ABI: 1.09
Right ABI: 1.13

## 2023-12-26 MED ORDER — GABAPENTIN 300 MG PO CAPS: 300.0000 mg | ORAL_CAPSULE | Freq: Every day | ORAL | 5 refills | Status: DC

## 2024-01-22 ENCOUNTER — Other Ambulatory Visit (HOSPITAL_BASED_OUTPATIENT_CLINIC_OR_DEPARTMENT_OTHER): Payer: Self-pay | Admitting: Family

## 2024-03-20 ENCOUNTER — Ambulatory Visit (INDEPENDENT_AMBULATORY_CARE_PROVIDER_SITE_OTHER): Payer: Medicare Other

## 2024-03-20 VITALS — Ht 66.0 in | Wt 186.0 lb

## 2024-03-20 DIAGNOSIS — Z0000 Encounter for general adult medical examination without abnormal findings: Secondary | ICD-10-CM | POA: Diagnosis not present

## 2024-03-20 NOTE — Patient Instructions (Signed)
 Clayton Powell , Thank you for taking time out of your busy schedule to complete your Annual Wellness Visit with me. I enjoyed our conversation and look forward to speaking with you again next year. I, as well as your care team,  appreciate your ongoing commitment to your health goals. Please review the following plan we discussed and let me know if I can assist you in the future. Your Game plan/ To Do List    Referrals: If you haven't heard from the office you've been referred to, please reach out to them at the phone provided.   Follow up Visits: We will see or speak with you next year for your Next Medicare AWV with our clinical staff Have you seen your provider in the last 6 months (3 months if uncontrolled diabetes)? No  Clinician Recommendations:  Aim for 30 minutes of exercise or brisk walking, 6-8 glasses of water , and 5 servings of fruits and vegetables each day.       This is a list of the screenings recommended for you:  Health Maintenance  Topic Date Due   Hepatitis C Screening  Never done   Zoster (Shingles) Vaccine (1 of 2) 06/23/1996   DTaP/Tdap/Td vaccine (3 - Tdap) 02/10/2019   COVID-19 Vaccine (9 - 2024-25 season) 03/26/2023   Flu Shot  02/23/2024   Colon Cancer Screening  01/19/2025   Medicare Annual Wellness Visit  03/20/2025   Pneumococcal Vaccine for age over 53  Completed   HPV Vaccine  Aged Out   Meningitis B Vaccine  Aged Out    Advanced directives: (In Chart) A copy of your advanced directives are scanned into your chart should your provider ever need it. Advance Care Planning is important because it:  [x]  Makes sure you receive the medical care that is consistent with your values, goals, and preferences  [x]  It provides guidance to your family and loved ones and reduces their decisional burden about whether or not they are making the right decisions based on your wishes.  Follow the link provided in your after visit summary or read over the paperwork we have  mailed to you to help you started getting your Advance Directives in place. If you need assistance in completing these, please reach out to us  so that we can help you!

## 2024-03-20 NOTE — Progress Notes (Signed)
 Please attest and cosign this visit due to patients primary care provider not being in the office at the time the visit was completed.    Subjective:   Clayton CHRISTELLA Salt, PhD is a 78 y.o. who presents for a Medicare Wellness preventive visit.  As a reminder, Annual Wellness Visits don't include a physical exam, and some assessments may be limited, especially if this visit is performed virtually. We may recommend an in-person follow-up visit with your provider if needed.  Visit Complete: Virtual I connected with  Clayton CHRISTELLA Salt, PhD on 03/20/24 by a audio enabled telemedicine application and verified that I am speaking with the correct person using two identifiers.  Patient Location: Home  Provider Location: Office/Clinic  I discussed the limitations of evaluation and management by telemedicine. The patient expressed understanding and agreed to proceed.  Vital Signs: Because this visit was a virtual/telehealth visit, some criteria may be missing or patient reported. Any vitals not documented were not able to be obtained and vitals that have been documented are patient reported.  VideoDeclined- This patient declined Librarian, academic. Therefore the visit was completed with audio only.  Persons Participating in Visit: Patient.  AWV Questionnaire: Yes: Patient Medicare AWV questionnaire was completed by the patient on 03/13/24; I have confirmed that all information answered by patient is correct and no changes since this date.  Cardiac Risk Factors include: advanced age (>40men, >13 women);dyslipidemia;male gender;obesity (BMI >30kg/m2)     Objective:    Today's Vitals   03/20/24 1427  Weight: 186 lb (84.4 kg)  Height: 5' 6 (1.676 m)   Body mass index is 30.02 kg/m.     03/20/2024    2:33 PM 08/02/2023    6:17 AM 07/27/2023    1:17 PM 06/06/2023    8:44 AM 05/14/2023    8:05 AM 05/13/2023    8:42 AM 05/12/2023    4:47 PM  Advanced Directives   Does Patient Have a Medical Advance Directive? Yes Yes Yes Yes No No No  Type of Estate agent of [place];Living will Healthcare Power of [place];Living will Healthcare Power of [employer] of [place];Living will     Does patient want to make changes to medical advance directive?  No - Patient declined No - Patient declined      Copy of Healthcare Power of Attorney in Chart? Yes - validated most recent copy scanned in chart (See row information) No - copy requested  No - copy requested     Would patient like information on creating a medical advance directive?       No - Patient declined    Current Medications (verified) Outpatient Encounter Medications as of 03/20/2024  Medication Sig   acetaminophen  (TYLENOL ) 500 MG tablet Take 1,000 mg by mouth every 6 (six) hours as needed for moderate pain or mild pain.   aspirin  EC 81 MG tablet Take 1 tablet (81 mg total) by mouth daily at 6 (six) AM. Swallow whole.   Azelastine -Fluticasone  137-50 MCG/ACT SUSP Place 1 spray into both nostrils daily as needed (Congestion).   CALCIUM  CITRATE PO Take 600 mg by mouth in the morning and at bedtime.   Cholecalciferol  (VITAMIN D ) 2000 UNITS CAPS Take 2,000 Units by mouth daily.   clindamycin (CLEOCIN T) 1 % external solution Apply 1 Application topically 2 (two) times daily.   clopidogrel  (PLAVIX ) 75 MG tablet TAKE 1 TABLET(75 MG) BY MOUTH DAILY   Cyanocobalamin  (VITAMIN B-12) 5000 MCG SUBL  Place 5,000 mcg under the tongue daily.   dapagliflozin  propanediol (FARXIGA ) 10 MG TABS tablet TAKE 1 TABLET(10 MG) BY MOUTH DAILY BEFORE BREAKFAST   gabapentin  (NEURONTIN ) 300 MG capsule Take 1 capsule (300 mg total) by mouth daily.   icosapent  Ethyl (VASCEPA ) 1 g capsule Take 1 capsule (1 g total) by mouth 4 (four) times daily.   Magnesium  Salicylate Tetrahyd (DOANS EXTRA STRENGTH) 580 MG TABS Take 2 tablets by mouth as needed (Pain).   montelukast (SINGULAIR) 10 MG tablet Take 10 mg  by mouth daily.   Multiple Vitamin (MULTIVITAMIN) capsule Take 1 capsule by mouth daily.   oxyCODONE -acetaminophen  (PERCOCET/ROXICET) 5-325 MG tablet Take 1-2 tablets by mouth every 4 (four) hours as needed for moderate pain (pain score 4-6).   rosuvastatin  (CRESTOR ) 40 MG tablet Take 1 tablet (40 mg total) by mouth daily.   SYMBICORT 80-4.5 MCG/ACT inhaler Inhale 2 puffs into the lungs 2 (two) times daily as needed (shortness of breath).   tadalafil  (CIALIS ) 5 MG tablet Take 5 mg by mouth daily.   No facility-administered encounter medications on file as of 03/20/2024.    Allergies (verified) Ciprofloxacin, Ciprofloxacin hcl, and Quinolones   History: Past Medical History:  Diagnosis Date   Acute gastroenteritis    Allergy    Anginal pain (HCC)    Arthritis    Atherosclerosis of native arteries of extremity with intermittent claudication (HCC)    Bilateral high frequency sensorineural hearing loss    BPH (benign prostatic hypertrophy) 12/2005   Bruxism    CAD (coronary artery disease) 2010   dx made by coronary calcium  scoring, Normal stress test in 2010.    Decreased dorsalis pedis pulse    ETD (eustachian tube dysfunction)    Gallstones    GERD (gastroesophageal reflux disease) 06/2004   HLD (hyperlipidemia) 09/1997   Hyperlipidemia, mixed    Incomplete bladder emptying    Left rotator cuff tear 08/12/2011   Per Dr. Josefina 2013 L rotator cuff tear on MRI 2013    Metabolic syndrome X    Peripheral vascular disease (HCC)    Prostatitis, chronic    Right rotator cuff tear 05/24/2013   Unstable angina (HCC)    Vasomotor rhinitis    Wears glasses    Past Surgical History:  Procedure Laterality Date   admitted to [facility] chest pain  12/15/05   Dr. Florencio   BLEPHAROPLASTY  10/11   Dr. Orval    bone spur removed Right    COLONOSCOPY  09/24/04   nml; Dr. Neita    CORONARY ARTERY BYPASS GRAFT N/A 05/29/2020   Procedure: CORONARY ARTERY BYPASS GRAFTING (CABG) TIMES THREE  USING LIMA to LAD; ENDOSCOPIC HARVESTED RIGHT GREATER SAPHENOUS VEIN: SVG to PD; SVG to RAMUS.;  Surgeon: Army Dallas NOVAK, MD;  Location: [facility] OR;  Service: Open Heart Surgery;  Laterality: N/A;   EGD esophagitis by bx  01/26/06   gastritis bx neg h-pylori   ENDOVEIN HARVEST OF GREATER SAPHENOUS VEIN Right 05/29/2020   Procedure: ENDOVEIN HARVEST OF GREATER SAPHENOUS VEIN;  Surgeon: Army Dallas NOVAK, MD;  Location: [facility] OR;  Service: Open Heart Surgery;  Laterality: Right;   FEMORAL-TIBIAL BYPASS GRAFT Right 08/02/2023   Procedure: BYPASS GRAFT FEMORAL-TIBIAL ARTERY;  Surgeon: Jama Cordella MATSU, MD;  Location: [facility] ORS;  Service: Vascular;  Laterality: Right;   LEFT HEART CATH AND CORONARY ANGIOGRAPHY N/A 05/26/2020   Procedure: LEFT HEART CATH AND CORONARY ANGIOGRAPHY;  Surgeon: Hester Wolm PARAS, MD;  Location: [facility] INVASIVE CV  LAB;  Service: Cardiovascular;  Laterality: N/A;   LOWER EXTREMITY ANGIOGRAPHY Right 10/01/2020   Procedure: LOWER EXTREMITY ANGIOGRAPHY;  Surgeon: Marea Selinda RAMAN, MD;  Location: [facility] INVASIVE CV LAB;  Service: Cardiovascular;  Laterality: Right;   LOWER EXTREMITY ANGIOGRAPHY Right 08/12/2021   Procedure: LOWER EXTREMITY ANGIOGRAPHY;  Surgeon: Marea Selinda RAMAN, MD;  Location: [facility] INVASIVE CV LAB;  Service: Cardiovascular;  Laterality: Right;   LOWER EXTREMITY ANGIOGRAPHY Right 08/23/2021   Procedure: LOWER EXTREMITY ANGIOGRAPHY;  Surgeon: Marea Selinda RAMAN, MD;  Location: [facility] INVASIVE CV LAB;  Service: Cardiovascular;  Laterality: Right;   LOWER EXTREMITY ANGIOGRAPHY Right 06/06/2023   Procedure: Lower Extremity Angiography;  Surgeon: Jama Cordella MATSU, MD;  Location: [facility] INVASIVE CV LAB;  Service: Cardiovascular;  Laterality: Right;   PARTIAL KNEE ARTHROPLASTY Left 09/06/2022   Procedure: UNICOMPARTMENTAL KNEE;  Surgeon: Josefina Chew, MD;  Location: [facility] ORS;  Service: Orthopedics;  Laterality: Left;   POLYPECTOMY     right and left prostate needle bx  05/02/03   acute inflammation;  Dr. Chales    SHOULDER ARTHROSCOPY W/ ROTATOR CUFF REPAIR  12/2011   left   SHOULDER ARTHROSCOPY WITH ROTATOR CUFF REPAIR AND SUBACROMIAL DECOMPRESSION Right 05/24/2013   Procedure: RIGHT SHOULDER ARTHROSCOPY WITH ROTATOR CUFF REPAIR AND SUBACROMIAL DECOMPRESSION AND PARTIAL ACROMIOPLSTY WITH CORACOACROMIAL RELEASE, DISTAL CLAVICULECTOMY, LABRIAL DEBRIDEMENT;  Surgeon: Chew SHAUNNA Josefina, MD;  Location: [facility];  Service: Orthopedics;  Laterality: Right;   spect ETT nml  12/16/05   TEE WITHOUT CARDIOVERSION N/A 05/29/2020   Procedure: TRANSESOPHAGEAL ECHOCARDIOGRAM (TEE);  Surgeon: Army Dallas NOVAK, MD;  Location: [facility] OR;  Service: Open Heart Surgery;  Laterality: N/A;   UPPER GASTROINTESTINAL ENDOSCOPY     VEIN HARVEST Left 08/02/2023   Procedure: VEIN HARVEST;  Surgeon: Jama Cordella MATSU, MD;  Location: [facility] ORS;  Service: Vascular;  Laterality: Left;   WISDOM TOOTH EXTRACTION     Family History  Problem Relation Age of Onset   Stroke Mother    Glaucoma Other        grandmother at 19   Barrett's esophagus Maternal Uncle    Prostate cancer Neg Hx    Colon cancer Neg Hx    Colon polyps Neg Hx    Esophageal cancer Neg Hx    Rectal cancer Neg Hx    Stomach cancer Neg Hx    Social History   Socioeconomic History   Marital status: Married    Spouse name: Jori   Number of children: 2   Years of education: Not on file   Highest education level: Doctorate  Occupational History   Not on file  Tobacco Use   Smoking status: Never   Smokeless tobacco: Never  Vaping Use   Vaping status: Never Used  Substance and Sexual Activity   Alcohol use: Yes    Alcohol/week: 4.0 standard drinks of alcohol    Types: 4 Cans of beer per week    Comment: beer, occ   Drug use: No   Sexual activity: Yes  Other Topics Concern   Not on file  Social History Narrative   Married, 2 children - one is local   Married 40+ years   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     Plays guitar   Works out regularly.   Social Drivers of Corporate investment banker Strain: Low Risk  (03/20/2024)   Overall Financial Resource Strain (CARDIA)    Difficulty of Paying Living Expenses: Not hard at all  Food Insecurity: No Food Insecurity (03/20/2024)   Hunger Vital Sign    Worried About Running Out of Food in the Last Year: Never true    Ran Out of Food in the Last Year: Never true  Transportation Needs: No Transportation Needs (03/20/2024)   PRAPARE - Administrator, Civil Service (Medical): No    Lack of Transportation (Non-Medical): No  Physical Activity: Sufficiently Active (03/20/2024)   Exercise Vital Sign    Days of Exercise per Week: 6 days    Minutes of Exercise per Session: 30 min  Stress: No Stress Concern Present (03/20/2024)   [employer] of Occupational Health - Occupational Stress Questionnaire    Feeling of Stress: Not at all  Social Connections: Socially Integrated (03/20/2024)   Social Connection and Isolation Panel    Frequency of Communication with Friends and Family: More than three times a week    Frequency of Social Gatherings with Friends and Family: More than three times a week    Attends Religious Services: More than 4 times per year    Active Member of [employer] or Organizations: Yes    Attends Engineer, structural: More than 4 times per year    Marital Status: Married    Tobacco Counseling Counseling given: Not Answered    Clinical Intake:  Pre-visit preparation completed: Yes  Pain : No/denies pain     BMI - recorded: 30.02 Nutritional Status: BMI > 30  Obese Nutritional Risks: None Diabetes: No  Lab Results  Component Value Date   HGBA1C 5.4 05/28/2020     How often do you need to have someone help you when you read instructions, pamphlets, or other written materials from your doctor or pharmacy?: 1 - Never  Interpreter Needed?: No  Comments: lives with wife Information entered by ::  B.Breck Hollinger,LPN   Activities of Daily Living     03/13/2024   11:10 AM 08/04/2023    8:48 AM  In your present state of health, do you have any difficulty performing the following activities:  Hearing? 0 0  Vision? 0 0  Difficulty concentrating or making decisions? 0 0  Walking or climbing stairs? 0   Dressing or bathing? 0   Doing errands, shopping? 0   Preparing Food and eating ? N   Using the Toilet? N   In the past six months, have you accidently leaked urine? N   Do you have problems with loss of bowel control? N   Managing your Medications? N   Managing your Finances? N   Housekeeping or managing your Housekeeping? N     Patient Care Team: Cleatus Arlyss RAMAN, MD as PCP - General (Family Medicine) Mona Vinie BROCKS, MD as PCP - Cardiology (Cardiology) Octavia Bruckner, MD as Consulting Physician (Ophthalmology)  I have updated your Care Teams any recent Medical Services you may have received from other providers in the past year.     Assessment:   This is a routine wellness examination for [place].  Hearing/Vision screen Hearing Screening - Comments:: Patient denies any hearing difficulties.   Vision Screening - Comments:: Pt says their vision is good with glasses Dr  BROCKS Octavia   Goals Addressed             This Visit's Progress    Patient Stated       03/20/24-Stay healthy and  active        Depression Screen     03/20/2024    2:31 PM 06/13/2023  2:09 PM 03/14/2023    1:08 PM 01/05/2023    3:15 PM 09/07/2020    5:22 PM 06/30/2020    2:51 PM 04/11/2017    4:16 PM  PHQ 2/9 Scores  PHQ - 2 Score 0 0 0 0 0 0 0  PHQ- 9 Score  4  0 2 0     Fall Risk     03/13/2024   11:10 AM 06/13/2023    2:09 PM 03/10/2023    2:55 PM 01/05/2023    2:52 PM 06/29/2020    2:06 PM  Fall Risk   Falls in the past year? 0 0 0 0 0  Number falls in past yr: 0 0 0 0   Injury with Fall? 0 0 0 0   Risk for fall due to : No Fall Risks No Fall Risks Impaired vision No Fall Risks    Follow up Falls prevention discussed;Education provided Falls evaluation completed Falls prevention discussed Falls evaluation completed     MEDICARE RISK AT HOME:  Medicare Risk at Home Any stairs in or around the home?: (Patient-Rptd) Yes If so, are there any without handrails?: (Patient-Rptd) No Home free of loose throw rugs in walkways, pet beds, electrical cords, etc?: (Patient-Rptd) Yes Adequate lighting in your home to reduce risk of falls?: (Patient-Rptd) Yes Life alert?: (Patient-Rptd) No Use of a cane, walker or w/c?: (Patient-Rptd) No Grab bars in the bathroom?: (Patient-Rptd) Yes Shower chair or bench in shower?: (Patient-Rptd) Yes Elevated toilet seat or a handicapped toilet?: (Patient-Rptd) Yes  TIMED UP AND GO:  Was the test performed?  No  Cognitive Function: 6CIT completed        03/20/2024    2:35 PM 03/14/2023    1:09 PM  6CIT Screen  What Year? 0 points 0 points  What month? 0 points 0 points  What time? 0 points 0 points  Count back from 20 0 points 0 points  Months in reverse 0 points 0 points  Repeat phrase 0 points 0 points  Total Score 0 points 0 points    Immunizations Immunization History  Administered Date(s) Administered   Influenza,inj,Quad PF,6+ Mos 07/21/2022   Influenza-Unspecified 04/24/2014, 05/18/2021, 05/20/2023   PFIZER(Purple Top)SARS-COV-2 Vaccination 09/06/2019, 10/01/2019, 05/14/2020, 11/15/2020   Pfizer Covid-19 Vaccine Bivalent Booster 66yrs & up 07/03/2021   Pfizer(Comirnaty)Fall Seasonal Vaccine 12 years and older 06/27/2022   Pneumococcal Conjugate-13 10/24/2014   Pneumococcal Polysaccharide-23 08/11/2011   Respiratory Syncytial Virus Vaccine,Recomb Aduvanted(Arexvy) 05/20/2023   Td 01/21/1999, 02/09/2009   Zoster, Live 02/09/2009    Screening Tests Health Maintenance  Topic Date Due   Hepatitis C Screening  Never done   Zoster Vaccines- Shingrix (1 of 2) 06/23/1996   DTaP/Tdap/Td (3 - Tdap) 02/10/2019   COVID-19  Vaccine (9 - 2024-25 season) 03/26/2023   INFLUENZA VACCINE  02/23/2024   Colonoscopy  01/19/2025   Medicare Annual Wellness (AWV)  03/20/2025   Pneumococcal Vaccine: 50+ Years  Completed   HPV VACCINES  Aged Out   Meningococcal B Vaccine  Aged Out    Health Maintenance  Health Maintenance Due  Topic Date Due   Hepatitis C Screening  Never done   Zoster Vaccines- Shingrix (1 of 2) 06/23/1996   DTaP/Tdap/Td (3 - Tdap) 02/10/2019   COVID-19 Vaccine (9 - 2024-25 season) 03/26/2023   INFLUENZA VACCINE  02/23/2024   Health Maintenance Items Addressed: None at this time  Additional Screening:  Vision Screening: Recommended annual ophthalmology exams for early detection of glaucoma and other  disorders of the eye. Would you like a referral to an eye doctor? No    Dental Screening: Recommended annual dental exams for proper oral hygiene  Community Resource Referral / Chronic Care Management: CRR required this visit?  No   CCM required this visit?  Appt scheduled with PCP and lab visit   Plan:    I have personally reviewed and noted the following in the patient's chart:   Medical and social history Use of alcohol, tobacco or illicit drugs  Current medications and supplements including opioid prescriptions. Patient is not currently taking opioid prescriptions. Functional ability and status Nutritional status Physical activity Advanced directives List of other physicians Hospitalizations, surgeries, and ER visits in previous 12 months Vitals Screenings to include cognitive, depression, and falls Referrals and appointments  In addition, I have reviewed and discussed with patient certain preventive protocols, quality metrics, and best practice recommendations. A written personalized care plan for preventive services as well as general preventive health recommendations were provided to patient.   Erminio LITTIE Saris, LPN   [id number]/72/[id number]   After Visit Summary: (MyChart) Due to  this being a telephonic visit, the after visit summary with patients personalized plan was offered to patient via MyChart   Notes: Nothing significant to report at this time.

## 2024-03-22 ENCOUNTER — Other Ambulatory Visit (INDEPENDENT_AMBULATORY_CARE_PROVIDER_SITE_OTHER): Payer: Self-pay | Admitting: Vascular Surgery

## 2024-03-27 ENCOUNTER — Other Ambulatory Visit (INDEPENDENT_AMBULATORY_CARE_PROVIDER_SITE_OTHER): Payer: Self-pay | Admitting: Vascular Surgery

## 2024-03-27 DIAGNOSIS — I70211 Atherosclerosis of native arteries of extremities with intermittent claudication, right leg: Secondary | ICD-10-CM

## 2024-03-28 ENCOUNTER — Ambulatory Visit (INDEPENDENT_AMBULATORY_CARE_PROVIDER_SITE_OTHER)

## 2024-03-28 ENCOUNTER — Ambulatory Visit (INDEPENDENT_AMBULATORY_CARE_PROVIDER_SITE_OTHER): Payer: PRIVATE HEALTH INSURANCE | Admitting: Vascular Surgery

## 2024-03-28 ENCOUNTER — Encounter (INDEPENDENT_AMBULATORY_CARE_PROVIDER_SITE_OTHER): Payer: Self-pay | Admitting: Vascular Surgery

## 2024-03-28 VITALS — BP 134/86 | HR 56

## 2024-03-28 DIAGNOSIS — I2510 Atherosclerotic heart disease of native coronary artery without angina pectoris: Secondary | ICD-10-CM

## 2024-03-28 DIAGNOSIS — E782 Mixed hyperlipidemia: Secondary | ICD-10-CM | POA: Diagnosis not present

## 2024-03-28 DIAGNOSIS — I70211 Atherosclerosis of native arteries of extremities with intermittent claudication, right leg: Secondary | ICD-10-CM

## 2024-03-28 DIAGNOSIS — K219 Gastro-esophageal reflux disease without esophagitis: Secondary | ICD-10-CM | POA: Diagnosis not present

## 2024-03-28 NOTE — Progress Notes (Signed)
 MRN : [id number]  Clayton KAKAR, PhD is a 78 y.o. ([birth year]) male who presents with chief complaint of check circulation.  History of Present Illness:   The patient returns to the office for followup and review status post The patient returns for follow-up.  He is status post right femoral to posterior tibial bypass with reversed great saphenous vein.  Surgery was performed August 02, 2023   The patient notes improvement in the lower extremity symptoms. No interval shortening of the patient's claudication distance and his rest pain symptoms are resolved. No new ulcers or wounds have occurred since the last visit.   Today he notes more of a burning pins-and-needles type pain more so on the right particularly in the medial knee area but also in the medial thigh.   There have been no significant changes to the patient's overall health care.   No documented history of amaurosis fugax or recent TIA symptoms. There are no recent neurological changes noted. No documented history of DVT, PE or superficial thrombophlebitis. The patient denies recent episodes of angina or shortness of breath.    ABI's Rt=1.13 and Lt=1.18  (previous ABI's Rt=1.13 and Lt=1.09) Duplex US  of the right lower extremity arterial system shows patent saphenous vein graft without evidence of stenosis.  No outpatient medications have been marked as taking for the 03/28/24 encounter (Office Visit) with Jama, Cordella MATSU, MD.    Past Medical History:  Diagnosis Date   Acute gastroenteritis    Allergy    Anginal pain (HCC)    Arthritis    Atherosclerosis of native arteries of extremity with intermittent claudication (HCC)    Bilateral high frequency sensorineural hearing loss    BPH (benign prostatic hypertrophy) 12/2005   Bruxism    CAD (coronary artery disease) 2010   dx made by coronary calcium  scoring, Normal stress test in 2010.     Decreased dorsalis pedis pulse    ETD (eustachian tube dysfunction)    Gallstones    GERD (gastroesophageal reflux disease) 06/2004   HLD (hyperlipidemia) 09/1997   Hyperlipidemia, mixed    Incomplete bladder emptying    Left rotator cuff tear 08/12/2011   Per Dr. Josefina 2013 L rotator cuff tear on MRI 2013    Metabolic syndrome X    Peripheral vascular disease (HCC)    Prostatitis, chronic    Right rotator cuff tear 05/24/2013   Unstable angina (HCC)    Vasomotor rhinitis    Wears glasses     Past Surgical History:  Procedure Laterality Date   admitted to [facility] chest pain  12/15/05   Dr. Florencio   BLEPHAROPLASTY  10/11   Dr. Orval    bone spur removed Right    COLONOSCOPY  09/24/04   nml; Dr. Neita    CORONARY ARTERY BYPASS GRAFT N/A 05/29/2020   Procedure: CORONARY ARTERY BYPASS GRAFTING (CABG) TIMES THREE USING LIMA to LAD; ENDOSCOPIC HARVESTED RIGHT GREATER SAPHENOUS VEIN: SVG to PD; SVG to RAMUS.;  Surgeon: Army Dallas NOVAK, MD;  Location: [facility] OR;  Service: Open Heart Surgery;  Laterality: N/A;   EGD  esophagitis by bx  01/26/06   gastritis bx neg h-pylori   ENDOVEIN HARVEST OF GREATER SAPHENOUS VEIN Right 05/29/2020   Procedure: ENDOVEIN HARVEST OF GREATER SAPHENOUS VEIN;  Surgeon: Army Dallas NOVAK, MD;  Location: [facility] OR;  Service: Open Heart Surgery;  Laterality: Right;   FEMORAL-TIBIAL BYPASS GRAFT Right 08/02/2023   Procedure: BYPASS GRAFT FEMORAL-TIBIAL ARTERY;  Surgeon: Jama Cordella MATSU, MD;  Location: [facility] ORS;  Service: Vascular;  Laterality: Right;   LEFT HEART CATH AND CORONARY ANGIOGRAPHY N/A 05/26/2020   Procedure: LEFT HEART CATH AND CORONARY ANGIOGRAPHY;  Surgeon: Hester Wolm PARAS, MD;  Location: [facility] IN[facility] LAB;  Service: Cardiovascular;  Laterality: N/A;   LOWER EXTREMITY ANGIOGRAPHY Right 10/01/2020   Procedure: LOWER EXTREMITY ANGIOGRAPHY;  Surgeon: Marea Selinda RAMAN, MD;  Location: [facility];  Service: Cardiovascular;  Laterality: Right;   LOWER  EXTREMITY ANGIOGRAPHY Right 08/12/2021   Procedure: LOWER EXTREMITY ANGIOGRAPHY;  Surgeon: Marea Selinda RAMAN, MD;  Location: [facility];  Service: Cardiovascular;  Laterality: Right;   LOWER EXTREMITY ANGIOGRAPHY Right 08/23/2021   Procedure: LOWER EXTREMITY ANGIOGRAPHY;  Surgeon: Marea Selinda RAMAN, MD;  Location: [facility];  Service: Cardiovascular;  Laterality: Right;   LOWER EXTREMITY ANGIOGRAPHY Right 06/06/2023   Procedure: Lower Extremity Angiography;  Surgeon: Jama Cordella MATSU, MD;  Location: [facility];  Service: Cardiovascular;  Laterality: Right;   PARTIAL KNEE ARTHROPLASTY Left 09/06/2022   Procedure: UNICOMPARTMENTAL KNEE;  Surgeon: Josefina Chew, MD;  Location: [facility] ORS;  Service: Orthopedics;  Laterality: Left;   POLYPECTOMY     right and left prostate needle bx  05/02/03   acute inflammation; Dr. Chales    SHOULDER ARTHROSCOPY W/ ROTATOR CUFF REPAIR  12/2011   left   SHOULDER ARTHROSCOPY WITH ROTATOR CUFF REPAIR AND SUBACROMIAL DECOMPRESSION Right 05/24/2013   Procedure: RIGHT SHOULDER ARTHROSCOPY WITH ROTATOR CUFF REPAIR AND SUBACROMIAL DECOMPRESSION AND PARTIAL ACROMIOPLSTY WITH CORACOACROMIAL RELEASE, DISTAL CLAVICULECTOMY, LABRIAL DEBRIDEMENT;  Surgeon: Chew SHAUNNA Josefina, MD;  Location: [facility];  Service: Orthopedics;  Laterality: Right;   spect ETT nml  12/16/05   TEE WITHOUT CARDIOVERSION N/A 05/29/2020   Procedure: TRANSESOPHAGEAL ECHOCARDIOGRAM (TEE);  Surgeon: Army Dallas NOVAK, MD;  Location: [facility] OR;  Service: Open Heart Surgery;  Laterality: N/A;   UPPER GASTROINTESTINAL ENDOSCOPY     VEIN HARVEST Left 08/02/2023   Procedure: VEIN HARVEST;  Surgeon: Jama Cordella MATSU, MD;  Location: [facility] ORS;  Service: Vascular;  Laterality: Left;   WISDOM TOOTH EXTRACTION      Social History Social History   Tobacco Use   Smoking status: Never   Smokeless tobacco: Never  Vaping Use   Vaping status: Never Used  Substance Use Topics   Alcohol  use: Yes    Alcohol/week: 4.0 standard drinks of alcohol    Types: 4 Cans of beer per week    Comment: beer, occ   Drug use: No    Family History Family History  Problem Relation Age of Onset   Stroke Mother    Glaucoma Other        grandmother at 75   Barrett's esophagus Maternal Uncle    Prostate cancer Neg Hx    Colon cancer Neg Hx    Colon polyps Neg Hx    Esophageal cancer Neg Hx    Rectal cancer Neg Hx    Stomach cancer Neg Hx     Allergies  Allergen Reactions   Ciprofloxacin Dermatitis  Other reaction(s): UNKNOWN   Ciprofloxacin Hcl Rash   Quinolones Rash     REVIEW OF SYSTEMS (Negative unless checked)  Constitutional: [] Weight loss  [] Fever  [] Chills Cardiac: [] Chest pain   [] Chest pressure   [] Palpitations   [] Shortness of breath when laying flat   [] Shortness of breath with exertion. Vascular:  [x] Pain in legs with walking   [] Pain in legs at rest  [] History of DVT   [] Phlebitis   [] Swelling in legs   [] Varicose veins   [] Non-healing ulcers Pulmonary:   [] Uses home oxygen   [] Productive cough   [] Hemoptysis   [] Wheeze  [] COPD   [] Asthma Neurologic:  [] Dizziness   [] Seizures   [] History of stroke   [] History of TIA  [] Aphasia   [] Vissual changes   [] Weakness or numbness in arm   [] Weakness or numbness in leg Musculoskeletal:   [] Joint swelling   [x] Joint pain   [] Low back pain Hematologic:  [] Easy bruising  [] Easy bleeding   [] Hypercoagulable state   [] Anemic Gastrointestinal:  [] Diarrhea   [] Vomiting  [x] Gastroesophageal reflux/heartburn   [] Difficulty swallowing. Genitourinary:  [] Chronic kidney disease   [] Difficult urination  [] Frequent urination   [] Blood in urine Skin:  [] Rashes   [] Ulcers  Psychological:  [] History of anxiety   []  History of major depression.  Physical Examination  Vitals:   03/28/24 1458  BP: 134/86  Pulse: (!) 56   There is no height or weight on file to calculate BMI. Gen: WD/WN, NAD Head: [place]/AT, No temporalis wasting.   Ear/Nose/Throat: Hearing grossly intact, nares w/o erythema or drainage Eyes: PER, EOMI, sclera nonicteric.  Neck: Supple, no masses.  No bruit or JVD.  Pulmonary:  Good air movement, no audible wheezing, no use of accessory muscles.  Cardiac: RRR, normal S1, S2, no Murmurs. Vascular:  mild trophic changes, no open wounds Vessel Right Left  Radial Palpable Palpable  PT  Palpable Trace Palpable  DP  Palpable Not Palpable  Gastrointestinal: soft, non-distended. No guarding/no peritoneal signs.  Musculoskeletal: M/S 5/5 throughout.  No visible deformity.  Neurologic: CN 2-12 intact. Pain and light touch intact in extremities.  Symmetrical.  Speech is fluent. Motor exam as listed above. Psychiatric: Judgment intact, Mood & affect appropriate for pt's clinical situation. Dermatologic: No rashes or ulcers noted.  No changes consistent with cellulitis.   CBC Lab Results  Component Value Date   WBC 6.6 08/03/2023   HGB 9.6 (L) 08/03/2023   HCT 29.4 (L) 08/03/2023   MCV 89.9 08/03/2023   PLT 177 08/03/2023    BMET    Component Value Date/Time   NA 131 (L) 08/03/2023 0933   NA 139 04/21/2023 0829   NA 140 05/12/2012 1240   K 4.0 08/03/2023 0933   K 4.2 05/12/2012 1240   CL 100 08/03/2023 0933   CL 107 05/12/2012 1240   CO2 22 08/03/2023 0933   CO2 26 05/12/2012 1240   GLUCOSE 104 (H) 08/03/2023 0933   GLUCOSE 101 (H) 05/12/2012 1240   BUN 25 (H) 08/03/2023 0933   BUN 20 04/21/2023 0829   BUN 21 (H) 05/12/2012 1240   CREATININE 1.11 08/03/2023 0933   CREATININE 0.86 05/12/2012 1240   CALCIUM  7.9 (L) 08/03/2023 0933   CALCIUM  8.9 05/12/2012 1240   GFRNONAA >60 08/03/2023 0933   GFRNONAA >60 05/12/2012 1240   GFRAA >60 05/12/2012 1240   CrCl cannot be calculated (Patient's most recent lab result is older than the maximum 21 days allowed.).  COAG Lab Results  Component Value Date   INR 1.0 05/13/2023   INR 1.4 (H) 05/29/2020   INR 1.1 05/28/2020    Radiology No  results found.   Assessment/Plan 1. Atherosclerosis of native artery of right lower extremity with intermittent claudication (HCC) (Primary) Recommend:   The patient is status post successful femoral distal bypass with reversed great saphenous vein.  The patient reports that the claudication symptoms and leg pain has been eliminated.   The patient denies lifestyle limiting changes at this point in time.   No further invasive studies, angiography or surgery at this time. The patient should continue walking and begin a more formal exercise program.  The patient should continue antiplatelet therapy and aggressive treatment of the lipid abnormalities   Continued surveillance is indicated as atherosclerosis is likely to progress with time.     Patient should undergo noninvasive studies as ordered. The patient will follow up with me to review the studies. - VAS US  ABI WITH/WO TBI; Future - VAS US  LOWER EXTREMITY ARTERIAL DUPLEX; Future  2. Coronary artery disease involving native coronary artery of native heart without angina pectoris Continue cardiac and antihypertensive medications as already ordered and reviewed, no changes at this time.  Continue statin as ordered and reviewed, no changes at this time  Nitrates PRN for chest pain  3. Hyperlipidemia, mixed Continue statin as ordered and reviewed, no changes at this time  4. Gastroesophageal reflux disease without esophagitis Continue PPI as already ordered, this medication has been reviewed and there are no changes at this time.  Avoidence of caffeine and alcohol  Moderate elevation of the head of the bed     Cordella Shawl, MD  03/28/2024 3:16 PM

## 2024-03-29 LAB — VAS US ABI WITH/WO TBI
Left ABI: 1.18
Right ABI: 1.13

## 2024-03-31 ENCOUNTER — Encounter (INDEPENDENT_AMBULATORY_CARE_PROVIDER_SITE_OTHER): Payer: Self-pay | Admitting: Vascular Surgery

## 2024-04-09 ENCOUNTER — Telehealth: Payer: Self-pay | Admitting: Internal Medicine

## 2024-04-09 ENCOUNTER — Other Ambulatory Visit: Payer: Self-pay | Admitting: Family

## 2024-04-09 MED ORDER — ICOSAPENT ETHYL 1 G PO CAPS: 1.0000 g | ORAL_CAPSULE | Freq: Four times a day (QID) | ORAL | 0 refills | Status: DC

## 2024-04-09 NOTE — Telephone Encounter (Signed)
 Pt's medication was sent to pt's pharmacy as requested. Confirmation received.

## 2024-04-09 NOTE — Telephone Encounter (Signed)
*  STAT* If patient is at the pharmacy, call can be transferred to refill team.   1. Which medications need to be refilled? (please list name of each medication and dose if known) icosapent  Ethyl (VASCEPA ) 1 g capsule    2. Would you like to learn more about the convenience, safety, & potential cost savings by using the [facility]?      3. Are you open to using the [facility] (Type [facility].  ).   4. Which pharmacy/location (including street and city if local pharmacy) is medication to be sent to? [id number] - [place], [place] - [address]    5. Do they need a 30 day or 90 day supply? 90 day

## 2024-04-28 ENCOUNTER — Other Ambulatory Visit: Payer: Self-pay | Admitting: Family Medicine

## 2024-04-28 DIAGNOSIS — E782 Mixed hyperlipidemia: Secondary | ICD-10-CM

## 2024-04-29 ENCOUNTER — Other Ambulatory Visit (HOSPITAL_BASED_OUTPATIENT_CLINIC_OR_DEPARTMENT_OTHER): Payer: Self-pay | Admitting: Internal Medicine

## 2024-05-14 ENCOUNTER — Other Ambulatory Visit (INDEPENDENT_AMBULATORY_CARE_PROVIDER_SITE_OTHER)

## 2024-05-14 DIAGNOSIS — E782 Mixed hyperlipidemia: Secondary | ICD-10-CM | POA: Diagnosis not present

## 2024-05-14 NOTE — Addendum Note (Signed)
 Addended by: HOPE VEVA PARAS on: 05/14/2024 09:01 AM   Modules accepted: Orders

## 2024-05-15 ENCOUNTER — Ambulatory Visit: Payer: Self-pay | Admitting: Family Medicine

## 2024-05-15 LAB — LIPID PANEL
Chol/HDL Ratio: 2.4 ratio (ref 0.0–5.0)
Cholesterol, Total: 114 mg/dL (ref 100–199)
HDL: 47 mg/dL (ref >39)
LDL Chol Calc (NIH): 41 mg/dL (ref 0–99)
Triglycerides: 151 mg/dL — ABNORMAL HIGH (ref 0–149)
VLDL Cholesterol Cal: 26 mg/dL (ref 5–40)

## 2024-05-15 LAB — COMPREHENSIVE METABOLIC PANEL WITH GFR
ALT: 22 IU/L (ref 0–44)
AST: 29 IU/L (ref 0–40)
Albumin: 4.3 g/dL (ref 3.8–4.8)
Alkaline Phosphatase: 64 IU/L (ref 47–123)
BUN/Creatinine Ratio: 18 (ref 10–24)
BUN: 18 mg/dL (ref 8–27)
Bilirubin Total: 0.6 mg/dL (ref 0.0–1.2)
CO2: 22 mmol/L (ref 20–29)
Calcium: 9 mg/dL (ref 8.6–10.2)
Chloride: 103 mmol/L (ref 96–106)
Creatinine, Ser: 0.98 mg/dL (ref 0.76–1.27)
Globulin, Total: 2.4 g/dL (ref 1.5–4.5)
Glucose: 86 mg/dL (ref 70–99)
Potassium: 4.7 mmol/L (ref 3.5–5.2)
Sodium: 141 mmol/L (ref 134–144)
Total Protein: 6.7 g/dL (ref 6.0–8.5)
eGFR: 79 mL/min/1.73 (ref >59)

## 2024-05-15 LAB — CBC WITH DIFFERENTIAL/PLATELET
Basophils Absolute: 0 x10E3/uL (ref 0.0–0.2)
Basos: 0 %
EOS (ABSOLUTE): 0.5 x10E3/uL — ABNORMAL HIGH (ref 0.0–0.4)
Eos: 8 %
Hematocrit: 49.3 % (ref 37.5–51.0)
Hemoglobin: 16.1 g/dL (ref 13.0–17.7)
Immature Grans (Abs): 0 x10E3/uL (ref 0.0–0.1)
Immature Granulocytes: 0 %
Lymphocytes Absolute: 2 x10E3/uL (ref 0.7–3.1)
Lymphs: 32 %
MCH: 31.2 pg (ref 26.6–33.0)
MCHC: 32.7 g/dL (ref 31.5–35.7)
MCV: 96 fL (ref 79–97)
Monocytes Absolute: 0.6 x10E3/uL (ref 0.1–0.9)
Monocytes: 10 %
Neutrophils Absolute: 3.1 x10E3/uL (ref 1.4–7.0)
Neutrophils: 50 %
Platelets: 190 x10E3/uL (ref 150–450)
RBC: 5.16 x10E6/uL (ref 4.14–5.80)
RDW: 13.5 % (ref 11.6–15.4)
WBC: 6.2 x10E3/uL (ref 3.4–10.8)

## 2024-05-21 ENCOUNTER — Ambulatory Visit (INDEPENDENT_AMBULATORY_CARE_PROVIDER_SITE_OTHER): Admitting: Family Medicine

## 2024-05-21 VITALS — BP 126/80 | HR 74 | Temp 98.5°F | Ht 65.5 in | Wt 192.0 lb

## 2024-05-21 DIAGNOSIS — R202 Paresthesia of skin: Secondary | ICD-10-CM | POA: Diagnosis not present

## 2024-05-21 DIAGNOSIS — N400 Enlarged prostate without lower urinary tract symptoms: Secondary | ICD-10-CM

## 2024-05-21 DIAGNOSIS — E782 Mixed hyperlipidemia: Secondary | ICD-10-CM

## 2024-05-21 DIAGNOSIS — Z7189 Other specified counseling: Secondary | ICD-10-CM

## 2024-05-21 DIAGNOSIS — Z951 Presence of aortocoronary bypass graft: Secondary | ICD-10-CM

## 2024-05-21 DIAGNOSIS — I739 Peripheral vascular disease, unspecified: Secondary | ICD-10-CM

## 2024-05-21 DIAGNOSIS — Z0000 Encounter for general adult medical examination without abnormal findings: Secondary | ICD-10-CM

## 2024-05-21 NOTE — Progress Notes (Unsigned)
 CAD and PAD.  He has vascular bypass.  He didn't get relief with gabapentin .  He has been exercising at [employer].  He still has some sensation changes in the legs but not cramping.  No CP.  Not SOB.   D/w pt about episodic R leg pain, no pain supine and better with exercise and stretching.    Elevated Cholesterol: Using medications without problems: yes Muscle aches: no Diet compliance: yes Exercise: yes On farxiga  per cards.    Had seen Dr. Frutoso with allergy clinic.    LUTS improved with ablation per urology.    [facility] RSV prev done.  PNA prev done.  Flu 2025.  PNA prev done.   Covid prev done.  PSA through urology.  Colonoscopy 2021 Living will d/w pt. Wife designated if patient were incapacitated.   PMH and SH reviewed  Meds, vitals, and allergies reviewed.   ROS: Per HPI unless specifically indicated in ROS section   GEN: nad, alert and oriented HEENT: mucous membranes moist NECK: supple w/o LA CV: rrr. PULM: ctab, no inc wob ABD: soft, +bs EXT: trace BLE edema SKIN: well perfused.  Altered sensation R leg with strength wnl BLE Normal int rotation R hip and R SLR neg.  Back not ttp.

## 2024-05-21 NOTE — Patient Instructions (Addendum)
 Update me as needed.  Thanks for your effort.  Take care.  Glad to see you.  Refer to [facility].  [address] [place], [place] [address] [phone]

## 2024-05-22 DIAGNOSIS — Z0000 Encounter for general adult medical examination without abnormal findings: Secondary | ICD-10-CM | POA: Insufficient documentation

## 2024-05-22 DIAGNOSIS — R202 Paresthesia of skin: Secondary | ICD-10-CM | POA: Insufficient documentation

## 2024-05-22 NOTE — Assessment & Plan Note (Signed)
 And episodic pain.  Did not respond to gabapentin .  Unclear if this is a peripheral issue related to previous vascular surgery or if this is coming from his back.  He was asking about seeing the neurosurgery clinic and I think this is reasonable.  Referral placed.  At this point still okay for outpatient follow-up.

## 2024-05-22 NOTE — Assessment & Plan Note (Signed)
 LUTS improved with ablation per urology.

## 2024-05-22 NOTE — Assessment & Plan Note (Signed)
 Living will d/w pt.  Wife designated if patient were incapacitated.   ?

## 2024-05-22 NOTE — Assessment & Plan Note (Signed)
 He does not have claudication but he has leg pain that is positional.  He improves when he is supine or exercising/stretching.  See leg pain discussion.  Continue Crestor  Vascepa  aspirin  and Plavix  in the meantime.  He has normal capillary refill in the extremities and it does not look like his current episodic leg pain is vascular.

## 2024-05-22 NOTE — Assessment & Plan Note (Signed)
 [facility] per patient report. RSV prev done.  PNA prev done.  Flu 2025.  PNA prev done.   Covid prev done.  PSA through urology.  Colonoscopy 2021 Living will d/w pt. Wife designated if patient were incapacitated.

## 2024-05-22 NOTE — Assessment & Plan Note (Signed)
 Continue Vascepa  and Crestor .  Diet and exercise discussed with patient.  Labs discussed with patient.

## 2024-05-22 NOTE — Assessment & Plan Note (Signed)
 Still on Farxiga .  Continue aspirin  Plavix  Vascepa  and Crestor .  Diet and exercise discussed with patient.  Labs discussed with patient.

## 2024-05-31 ENCOUNTER — Telehealth: Payer: Self-pay | Admitting: Family Medicine

## 2024-05-31 NOTE — Telephone Encounter (Signed)
 Copied from CRM #[id number]. Topic: Referral - Status >> May 31, 2024 11:16 AM Roselie BROCKS wrote: Reason for CRM: Patient states as referral to [facility] is to go to a pain management clinic and needs update on it , patient request a return call concerning this

## 2024-06-02 NOTE — Telephone Encounter (Signed)
 I would like him to see the neurosurgery clinic.  I sent a note to the referral department to see about getting him scheduled.

## 2024-06-04 NOTE — Telephone Encounter (Signed)
 Dr. Darlis is at the neurosurgery clinic, so the referral should go to neurosurgery.  Please process and update patient.

## 2024-06-04 NOTE — Telephone Encounter (Signed)
 Please give clarification of which referral needs to be processed.   Patient is stating that is supposed to be referred to Pain Clinic and Dr Elfredia recent note states Neurosurgery.   Please advise, thanks.

## 2024-06-04 NOTE — Telephone Encounter (Signed)
 Called and spoke with patient.  Pt needs a referral to pain management not neurosurgery with  Deatrice Manus, MD. Pt complains of calling in to the clinic to see if it was received and states that it was not.

## 2024-06-11 ENCOUNTER — Ambulatory Visit: Attending: Internal Medicine | Admitting: Internal Medicine

## 2024-06-11 VITALS — BP 120/70 | HR 65 | Ht 65.5 in | Wt 190.0 lb

## 2024-06-11 DIAGNOSIS — Z951 Presence of aortocoronary bypass graft: Secondary | ICD-10-CM | POA: Insufficient documentation

## 2024-06-11 DIAGNOSIS — I10 Essential (primary) hypertension: Secondary | ICD-10-CM | POA: Diagnosis present

## 2024-06-11 DIAGNOSIS — I70229 Atherosclerosis of native arteries of extremities with rest pain, unspecified extremity: Secondary | ICD-10-CM | POA: Insufficient documentation

## 2024-06-11 DIAGNOSIS — E785 Hyperlipidemia, unspecified: Secondary | ICD-10-CM | POA: Diagnosis present

## 2024-06-11 DIAGNOSIS — E782 Mixed hyperlipidemia: Secondary | ICD-10-CM | POA: Diagnosis present

## 2024-06-11 DIAGNOSIS — I2510 Atherosclerotic heart disease of native coronary artery without angina pectoris: Secondary | ICD-10-CM | POA: Insufficient documentation

## 2024-06-11 MED ORDER — DAPAGLIFLOZIN PROPANEDIOL 10 MG PO TABS: ORAL_TABLET | ORAL | 3 refills | Status: AC

## 2024-06-11 MED ORDER — CLOPIDOGREL BISULFATE 75 MG PO TABS
75.0000 mg | ORAL_TABLET | Freq: Every day | ORAL | 3 refills | Status: AC
Start: 2024-06-11 — End: ?

## 2024-06-11 MED ORDER — ASPIRIN 81 MG PO TBEC: 81.0000 mg | DELAYED_RELEASE_TABLET | Freq: Every day | ORAL | 3 refills | Status: AC

## 2024-06-11 MED ORDER — ICOSAPENT ETHYL 1 G PO CAPS: 2.0000 g | ORAL_CAPSULE | Freq: Two times a day (BID) | ORAL | 3 refills | Status: DC

## 2024-06-11 MED ORDER — ROSUVASTATIN CALCIUM 40 MG PO TABS: 40.0000 mg | ORAL_TABLET | Freq: Every day | ORAL | 3 refills | Status: AC

## 2024-06-11 NOTE — Progress Notes (Signed)
 LIPID CLINIC CONSULT NOTE  Chief Complaint:  Follow-up  Primary Care Physician: Clayton Arlyss RAMAN, MD  Primary Cardiologist:  Clayton JAYSON Maxcy, MD  HPI:  Clayton Powell, Clayton Powell is a 78 y.o. male who is being seen today for the evaluation of dyslipidemia at the request of Clayton, Arlyss RAMAN, MD. 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  He has been heavily involved in the lipoprotein NMR testing and has previously been followed by Clayton Powell.  He has a history of coronary artery disease with CABG in 2021, followed by Clayton Powell with the [facility] clinic.  He also has PAD with peripheral stents followed by Clayton Powell.  He is currently on rosuvastatin  40 mg daily and will recently Vascepa  2 g twice daily.  He also takes aspirin  81 mg daily and clopidogrel  75 mg daily.  He provided lab work that was drawn on September 30, 2021, demonstrating total cholesterol 100, triglycerides 101, HDL 50 and LDL 31.  He had a lipid NMR performed back in November 2022 which showed an LDL-P less than 300, LDL-see if 30, HDL-C43, triglycerides 154 and total cholesterol 99.  HDL-P of 34 and small LDL P was undetectable (<90 nmol/L).  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  This is of course a significant risk factor for cardiovascular disease with a low prevalence.  Currently reports no chest pain or worsening shortness of breath.  He tries to walk regularly and says he plays golf.  06/02/2023  Clayton Powell returns today for follow-up.  He is transitioning to general cardiology care with me since Clayton Powell is no longer seeing patients and Dr. Pernell has transferred to [facility].  He has had an excellent repeat lipid profile.  His LDL particle number is less than 300 with an LDL of 39, HDL 37 and triglycerides 132.  Small LDL particle #152.  Overall this appears to be low risk.  Since I last saw him he was seen by Clayton Finder, NP.  He  underwent a cardiac PET scan for preoperative clearance given a history of prior CABG.  He has been asymptomatic.  Unfortunately the PET scan was abnormal, suggesting mild basal inferolateral ischemia.  I reviewed the findings with Clayton Powell and felt that medical therapy was recommended.  The rest ejection fraction on this study was 45%.  It is unclear if this was accurate or not.  06/11/2024  Clayton Powell is seen today for follow-up.  Overall he says he is doing pretty well.  He denies any chest pain or worsening shortness of breath.  [facility]  He says this helps him a lot with his legs which is his main issue regarding PAD and neuropathy.  He had recent labs at his physical in October showing total cholesterol 114, triglycerides 151, HDL 47 and LDL 41.  His last echo in May 2025 showed normalization of LVEF with some aortic valve sclerosis.  PMHx:  Past Medical History:  Diagnosis Date   Acute gastroenteritis    Allergy    Anginal pain    Arthritis    Atherosclerosis of native arteries of extremity with intermittent claudication    Bilateral high frequency sensorineural hearing loss    BPH (benign prostatic hypertrophy) 12/2005   Bruxism    CAD (coronary artery disease) 2010   dx made by coronary calcium  scoring, Normal  stress test in 2010.    Cataract 2023   Decreased dorsalis pedis pulse    ETD (eustachian tube dysfunction)    Gallstones    GERD (gastroesophageal reflux disease) 06/2004   HLD (hyperlipidemia) 09/1997   Hyperlipidemia, mixed    Incomplete bladder emptying    Left rotator cuff tear 08/12/2011   Per Dr. Josefina 2013 L rotator cuff tear on MRI 2013    Metabolic syndrome X    Neuromuscular disorder (HCC) 08/02/2023   Peripheral vascular disease    Prostatitis, chronic    Right rotator cuff tear 05/24/2013   Unstable angina (HCC)    Vasomotor rhinitis    Wears glasses     Past Surgical History:  Procedure  Laterality Date   admitted to [facility] chest pain  12/15/2005   Dr. Florencio   BLEPHAROPLASTY  04/2010   Dr. Orval    bone spur removed Right    COLONOSCOPY  09/24/2004   nml; Dr. Neita    CORONARY ANGIOPLASTY     CORONARY ARTERY BYPASS GRAFT N/A 05/29/2020   Procedure: CORONARY ARTERY BYPASS GRAFTING (CABG) TIMES THREE USING LIMA to LAD; ENDOSCOPIC HARVESTED RIGHT GREATER SAPHENOUS VEIN: SVG to PD; SVG to RAMUS.;  Surgeon: Army Dallas NOVAK, MD;  Location: [facility] OR;  Service: Open Heart Surgery;  Laterality: N/A;   EGD esophagitis by bx  01/26/2006   gastritis bx neg h-pylori   ENDOVEIN HARVEST OF GREATER SAPHENOUS VEIN Right 05/29/2020   Procedure: ENDOVEIN HARVEST OF GREATER SAPHENOUS VEIN;  Surgeon: Army Dallas NOVAK, MD;  Location: [facility] OR;  Service: Open Heart Surgery;  Laterality: Right;   FEMORAL-TIBIAL BYPASS GRAFT Right 08/02/2023   Procedure: BYPASS GRAFT FEMORAL-TIBIAL ARTERY;  Surgeon: Jama Cordella MATSU, MD;  Location: [facility] ORS;  Service: Vascular;  Laterality: Right;   JOINT REPLACEMENT  09/06/2022   Parial knee   LEFT HEART CATH AND CORONARY ANGIOGRAPHY N/A 05/26/2020   Procedure: LEFT HEART CATH AND CORONARY ANGIOGRAPHY;  Surgeon: Powell Wolm PARAS, MD;  Location: [facility] INVASIVE CV LAB;  Service: Cardiovascular;  Laterality: N/A;   LOWER EXTREMITY ANGIOGRAPHY Right 10/01/2020   Procedure: LOWER EXTREMITY ANGIOGRAPHY;  Surgeon: Marea Selinda RAMAN, MD;  Location: [facility] INVASIVE CV LAB;  Service: Cardiovascular;  Laterality: Right;   LOWER EXTREMITY ANGIOGRAPHY Right 08/12/2021   Procedure: LOWER EXTREMITY ANGIOGRAPHY;  Surgeon: Marea Selinda RAMAN, MD;  Location: [facility] INVASIVE CV LAB;  Service: Cardiovascular;  Laterality: Right;   LOWER EXTREMITY ANGIOGRAPHY Right 08/23/2021   Procedure: LOWER EXTREMITY ANGIOGRAPHY;  Surgeon: Marea Selinda RAMAN, MD;  Location: [facility] INVASIVE CV LAB;  Service: Cardiovascular;  Laterality: Right;   LOWER EXTREMITY ANGIOGRAPHY Right 06/06/2023   Procedure: Lower  Extremity Angiography;  Surgeon: Jama Cordella MATSU, MD;  Location: [facility] INVASIVE CV LAB;  Service: Cardiovascular;  Laterality: Right;   PARTIAL KNEE ARTHROPLASTY Left 09/06/2022   Procedure: UNICOMPARTMENTAL KNEE;  Surgeon: Josefina Chew, MD;  Location: [facility] ORS;  Service: Orthopedics;  Laterality: Left;   POLYPECTOMY     right and left prostate needle bx  05/02/2003   acute inflammation; Dr. Chales    SHOULDER ARTHROSCOPY W/ ROTATOR CUFF REPAIR  12/2011   left   SHOULDER ARTHROSCOPY WITH ROTATOR CUFF REPAIR AND SUBACROMIAL DECOMPRESSION Right 05/24/2013   Procedure: RIGHT SHOULDER ARTHROSCOPY WITH ROTATOR CUFF REPAIR AND SUBACROMIAL DECOMPRESSION AND PARTIAL ACROMIOPLSTY WITH CORACOACROMIAL RELEASE, DISTAL CLAVICULECTOMY, LABRIAL DEBRIDEMENT;  Surgeon: Chew SHAUNNA Josefina, MD;  Location: [facility];  Service: Orthopedics;  Laterality: Right;   spect ETT  nml  12/16/2005   TEE WITHOUT CARDIOVERSION N/A 05/29/2020   Procedure: TRANSESOPHAGEAL ECHOCARDIOGRAM (TEE);  Surgeon: Army Dallas NOVAK, MD;  Location: [facility] OR;  Service: Open Heart Surgery;  Laterality: N/A;   UPPER GASTROINTESTINAL ENDOSCOPY     VEIN HARVEST Left 08/02/2023   Procedure: VEIN HARVEST;  Surgeon: Jama Cordella MATSU, MD;  Location: [facility] ORS;  Service: Vascular;  Laterality: Left;   WISDOM TOOTH EXTRACTION      FAMHx:  Family History  Problem Relation Age of Onset   Stroke Mother    Glaucoma Other        grandmother at 72   Barrett's esophagus Maternal Uncle    Prostate cancer Neg Hx    Colon cancer Neg Hx    Colon polyps Neg Hx    Esophageal cancer Neg Hx    Rectal cancer Neg Hx    Stomach cancer Neg Hx     SOCHx:   reports that he has never smoked. He has never used smokeless tobacco. He reports current alcohol use of about 4.0 standard drinks of alcohol per week. He reports that he does not use drugs.  ALLERGIES:  Allergies  Allergen Reactions   Ciprofloxacin Dermatitis    rash   Quinolones  Rash    ROS: Pertinent items noted in HPI and remainder of comprehensive ROS otherwise negative.  HOME MEDS: Current Outpatient Medications on File Prior to Visit  Medication Sig Dispense Refill   acetaminophen  (TYLENOL ) 500 MG tablet Take 1,000 mg by mouth every 6 (six) hours as needed for moderate pain or mild pain.     aspirin  EC 81 MG tablet Take 1 tablet (81 mg total) by mouth daily at 6 (six) AM. Swallow whole. 30 tablet 12   Azelastine -Fluticasone  137-50 MCG/ACT SUSP Place 1 spray into both nostrils daily as needed (Congestion).     CALCIUM  CITRATE PO Take 600 mg by mouth in the morning and at bedtime.     Cholecalciferol  (VITAMIN D ) 2000 UNITS CAPS Take 2,000 Units by mouth daily.     clindamycin (CLEOCIN T) 1 % external solution Apply 1 Application topically 2 (two) times daily.     clopidogrel  (PLAVIX ) 75 MG tablet TAKE 1 TABLET(75 MG) BY MOUTH DAILY 90 tablet 2   Cyanocobalamin  (VITAMIN B-12) 5000 MCG SUBL Place 5,000 mcg under the tongue daily.     dapagliflozin  propanediol (FARXIGA ) 10 MG TABS tablet Take 1 tablet by mouth daily before breakfast. PLEASE MAKE A FOLLOW UP APPOINTMENT WITH CARDIOLOGY FOR REFILLS. [phone]. 90 tablet 0   icosapent  Ethyl (VASCEPA ) 1 g capsule Take 1 capsule (1 g total) by mouth 4 (four) times daily. 360 capsule 0   levocetirizine (XYZAL) 5 MG tablet Take 5 mg by mouth daily.     montelukast (SINGULAIR) 10 MG tablet Take 10 mg by mouth daily.     Multiple Vitamin (MULTIVITAMIN) capsule Take 1 capsule by mouth daily.     rosuvastatin  (CRESTOR ) 40 MG tablet Take 1 tablet (40 mg total) by mouth daily. 90 tablet 3   SYMBICORT 80-4.5 MCG/ACT inhaler Inhale 2 puffs into the lungs 2 (two) times daily as needed (shortness of breath).     tadalafil  (CIALIS ) 5 MG tablet Take 5 mg by mouth daily.     No current facility-administered medications on file prior to visit.    LABS/IMAGING: No results found for this or any previous visit (from the past 48  hours). No results found.  LIPID PANEL:    Component Value  Date/Time   CHOL 114 05/14/2024 0901   TRIG 151 (H) 05/14/2024 0901   HDL 47 05/14/2024 0901   CHOLHDL 2.4 05/14/2024 0901   CHOLHDL 2.8 05/26/2020 0615   VLDL 28 05/26/2020 0615   LDLCALC 41 05/14/2024 0901    WEIGHTS: Wt Readings from Last 3 Encounters:  06/11/24 190 lb (86.2 kg)  05/21/24 192 lb (87.1 kg)  03/20/24 186 lb (84.4 kg)    VITALS: BP 120/70 (BP Location: Left Arm, Patient Position: Sitting, Cuff Size: Normal)   Pulse 65   Ht 5' 5.5 (1.664 m)   Wt 190 lb (86.2 kg)   BMI 31.14 kg/m   EXAM: General appearance: alert and no distress Neck: no carotid bruit, no JVD, and thyroid not enlarged, symmetric, no tenderness/mass/nodules Lungs: clear to auscultation bilaterally Heart: regular rate and rhythm, S1, S2 normal, no murmur, click, rub or gallop Abdomen: soft, non-tender; bowel sounds normal; no masses,  no organomegaly Extremities: extremities normal, atraumatic, no cyanosis or edema Pulses: 2+ and symmetric Skin: Skin color, texture, turgor normal. 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  His main issues are with PAD or probably more likely neuropathy.  He exercises he says has been very helpful for this.  Have encouraged him to continue with that.  Blood pressure is well-controlled.  His lipids are also reasonably well-controlled except for very mildly elevated triglycerides, however he is already on Vascepa  and rosuvastatin .  Plan follow-up annually or sooner as necessary.  Kent KYM Mona, MD, [facility], FNLA, FACP  [place]  [facility]  Medical Director of the Advanced Lipid Disorders &  Cardiovascular Risk Reduction Clinic Diplomate of the American Board of Clinical Lipidology Attending Cardiologist  Direct Dial: [phone]  Fax: [phone]  Website:  [email or web address]   Kent BROCKS Renatta Shrieves 06/11/2024, 8:20 AM

## 2024-06-11 NOTE — Patient Instructions (Signed)
 Medication Instructions:  NO CHANGES  *If you need a refill on your cardiac medications before your next appointment, please call your pharmacy*   Follow-Up: At [facility], you and your health needs are our priority.  As part of our continuing mission to provide you with exceptional heart care, our providers are all part of one team.  This team includes your primary Cardiologist (physician) and Advanced Practice Providers or APPs (Physician Assistants and Nurse Practitioners) who all work together to provide you with the care you need, when you need it.  Your next appointment:    12 months with Dr. Maximo Spar  We recommend signing up for the patient portal called "MyChart".  Sign up information is provided on this After Visit Summary.  MyChart is used to connect with patients for Virtual Visits (Telemedicine).  Patients are able to view lab/test results, encounter notes, upcoming appointments, etc.  Non-urgent messages can be sent to your provider as well.   To learn more about what you can do with MyChart, go to [email or web address].

## 2024-07-06 ENCOUNTER — Other Ambulatory Visit: Payer: Self-pay | Admitting: Internal Medicine

## 2024-08-30 ENCOUNTER — Encounter: Payer: Self-pay | Admitting: Internal Medicine

## 2024-08-30 ENCOUNTER — Other Ambulatory Visit: Payer: Self-pay

## 2024-08-30 ENCOUNTER — Telehealth: Admitting: Internal Medicine

## 2024-08-30 DIAGNOSIS — H04122 Dry eye syndrome of left lacrimal gland: Secondary | ICD-10-CM

## 2024-08-30 NOTE — Progress Notes (Signed)
 Virtual Visit via Video Note  I connected with Clayton CHRISTELLA Salt, PhD on 08/30/24 at  2:20 PM EST by a video enabled telemedicine application and verified that I am speaking with the correct person using two identifiers.  Location: Patient: Home Provider: [facility]   I discussed the limitations of evaluation and management by telemedicine and the availability of in person appointments. The patient expressed understanding and agreed to proceed.  History of Present Illness:  Discussed the use of AI scribe software for clinical note transcription with the patient, who gave verbal consent to proceed.  History of Present Illness Clayton CHRISTELLA Salt, PhD Clayton Powell is a 79 year old male who presents with tearing and redness of the eyes.  He has had tearing and redness in both eyes for 2 to 3 weeks, worse in the left, with tearing like crying and increased redness noted by his wife. He has cataracts and recent eyeglass prescription changes. He uses azelastine  eye drops from his allergist and feels they have worsened his symptoms. He also uses two nasal sprays and oral antihistamines including Xyzal, montelukast, and levocetirizine for allergies. He denies blurred or double vision but reports eye dryness. On waking he has yellow crusting around the eyes but no goopy or slimy discharge. He is allergic to trees, though it is not currently tree season.    Observations/Objective:  General: well appearing, no acute distress ENT: left upper eyelid slightly swollen with mild medial erythema on the left, no drainage visualized. Red eye normal.  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 Symptoms exacerbated by azelastine  eye drops.  - Discontinued azelastine  eye drops. - Use Refresh lubricating eye drops, one  drop in each eye up to four times a day. - Consider using a humidifier in the bedroom. - If symptoms do not improve, schedule an appointment with an eye doctor. - Continue Xyzal, montelukast, and levocetirizine for allergy management.   Follow Up Instructions: follow up with optometrist as needed    I discussed the assessment and treatment plan with the patient. The patient was provided an opportunity to ask questions and all were answered. The patient agreed with the plan and demonstrated an understanding of the instructions.   The patient was advised to call back or seek an in-person evaluation if the symptoms worsen or if the condition fails to improve as anticipated.  I provided 10 minutes of non-face-to-face time during this encounter.   Sharyle Fischer, DO

## 2024-09-26 ENCOUNTER — Encounter (INDEPENDENT_AMBULATORY_CARE_PROVIDER_SITE_OTHER)

## 2024-09-26 ENCOUNTER — Encounter (INDEPENDENT_AMBULATORY_CARE_PROVIDER_SITE_OTHER): Payer: PRIVATE HEALTH INSURANCE

## 2024-09-26 ENCOUNTER — Ambulatory Visit (INDEPENDENT_AMBULATORY_CARE_PROVIDER_SITE_OTHER): Payer: PRIVATE HEALTH INSURANCE | Admitting: Vascular Surgery

## 2025-03-21 ENCOUNTER — Ambulatory Visit

## 2025-05-15 ENCOUNTER — Other Ambulatory Visit

## 2025-05-22 ENCOUNTER — Encounter: Admitting: Family Medicine
